# Patient Record
Sex: Male | Born: 1965 | Race: White | Hispanic: No | Marital: Single | Smoking: Former smoker
Health system: Southern US, Community
[De-identification: ages and names within clinical notes are randomized; demographics above are authoritative.]

## PROBLEM LIST (undated history)

## (undated) DIAGNOSIS — I2510 Atherosclerotic heart disease of native coronary artery without angina pectoris: Secondary | ICD-10-CM

## (undated) DIAGNOSIS — I219 Acute myocardial infarction, unspecified: Secondary | ICD-10-CM

## (undated) DIAGNOSIS — E785 Hyperlipidemia, unspecified: Secondary | ICD-10-CM

## (undated) DIAGNOSIS — G43909 Migraine, unspecified, not intractable, without status migrainosus: Secondary | ICD-10-CM

## (undated) DIAGNOSIS — I10 Essential (primary) hypertension: Secondary | ICD-10-CM

## (undated) DIAGNOSIS — I5020 Unspecified systolic (congestive) heart failure: Secondary | ICD-10-CM

## (undated) DIAGNOSIS — I5022 Chronic systolic (congestive) heart failure: Secondary | ICD-10-CM

## (undated) DIAGNOSIS — Q211 Atrial septal defect, unspecified: Secondary | ICD-10-CM

## (undated) DIAGNOSIS — I255 Ischemic cardiomyopathy: Secondary | ICD-10-CM

## (undated) DIAGNOSIS — K219 Gastro-esophageal reflux disease without esophagitis: Secondary | ICD-10-CM

## (undated) DIAGNOSIS — I447 Left bundle-branch block, unspecified: Secondary | ICD-10-CM

## (undated) HISTORY — DX: Atrial septal defect: Q21.1

## (undated) HISTORY — DX: Atrial septal defect, unspecified: Q21.10

## (undated) HISTORY — DX: Hyperlipidemia, unspecified: E78.5

## (undated) HISTORY — DX: Left bundle-branch block, unspecified: I44.7

## (undated) HISTORY — DX: Unspecified systolic (congestive) heart failure: I50.20

## (undated) HISTORY — PX: CORONARY ANGIOPLASTY: SHX604

## (undated) HISTORY — DX: Gastro-esophageal reflux disease without esophagitis: K21.9

---

## 2005-08-23 ENCOUNTER — Other Ambulatory Visit: Payer: Self-pay

## 2005-08-23 ENCOUNTER — Emergency Department: Payer: Self-pay | Admitting: Emergency Medicine

## 2007-06-02 ENCOUNTER — Emergency Department: Payer: Self-pay | Admitting: Emergency Medicine

## 2007-07-06 ENCOUNTER — Emergency Department: Payer: Self-pay | Admitting: Emergency Medicine

## 2008-07-01 ENCOUNTER — Emergency Department: Payer: Self-pay | Admitting: Emergency Medicine

## 2008-11-03 ENCOUNTER — Inpatient Hospital Stay: Payer: Self-pay | Admitting: Internal Medicine

## 2010-01-25 ENCOUNTER — Inpatient Hospital Stay: Payer: Self-pay | Admitting: Specialist

## 2010-11-12 ENCOUNTER — Inpatient Hospital Stay: Payer: Self-pay | Admitting: Internal Medicine

## 2010-12-20 ENCOUNTER — Inpatient Hospital Stay: Payer: Self-pay | Admitting: Internal Medicine

## 2010-12-26 ENCOUNTER — Ambulatory Visit: Payer: Self-pay

## 2011-06-08 ENCOUNTER — Emergency Department: Payer: Self-pay | Admitting: Emergency Medicine

## 2011-06-08 LAB — CBC
HCT: 41.4 % (ref 40.0–52.0)
HGB: 14.1 g/dL (ref 13.0–18.0)
MCH: 32.5 pg (ref 26.0–34.0)
MCHC: 34 g/dL (ref 32.0–36.0)
MCV: 96 fL (ref 80–100)
RBC: 4.33 10*6/uL — ABNORMAL LOW (ref 4.40–5.90)
RDW: 14.2 % (ref 11.5–14.5)
WBC: 6.3 10*3/uL (ref 3.8–10.6)

## 2011-06-08 LAB — BASIC METABOLIC PANEL
Anion Gap: 8 (ref 7–16)
BUN: 9 mg/dL (ref 7–18)
Chloride: 104 mmol/L (ref 98–107)
Creatinine: 1.02 mg/dL (ref 0.60–1.30)
EGFR (African American): >60
EGFR (Non-African Amer.): >60
Glucose: 110 mg/dL — ABNORMAL HIGH (ref 65–99)
Osmolality: 279 (ref 275–301)
Sodium: 140 mmol/L (ref 136–145)

## 2011-06-08 LAB — TROPONIN I: Troponin-I: <0.02 ng/mL

## 2012-08-26 ENCOUNTER — Emergency Department: Payer: Self-pay | Admitting: Emergency Medicine

## 2012-12-16 ENCOUNTER — Emergency Department: Payer: Self-pay | Admitting: Emergency Medicine

## 2013-02-23 ENCOUNTER — Emergency Department: Payer: Self-pay | Admitting: Emergency Medicine

## 2013-11-12 ENCOUNTER — Emergency Department: Payer: Self-pay | Admitting: Emergency Medicine

## 2013-11-12 LAB — COMPREHENSIVE METABOLIC PANEL
ALBUMIN: 4.7 g/dL (ref 3.4–5.0)
ALT: 36 U/L (ref 12–78)
Alkaline Phosphatase: 82 U/L
Anion Gap: 7 (ref 7–16)
BUN: 21 mg/dL — ABNORMAL HIGH (ref 7–18)
Bilirubin,Total: 1.1 mg/dL — ABNORMAL HIGH (ref 0.2–1.0)
CALCIUM: 9.6 mg/dL (ref 8.5–10.1)
CHLORIDE: 104 mmol/L (ref 98–107)
CREATININE: 1.3 mg/dL (ref 0.60–1.30)
Co2: 24 mmol/L (ref 21–32)
EGFR (African American): >60
Glucose: 101 mg/dL — ABNORMAL HIGH (ref 65–99)
OSMOLALITY: 273 (ref 275–301)
POTASSIUM: 3.6 mmol/L (ref 3.5–5.1)
SGOT(AST): 35 U/L (ref 15–37)
SODIUM: 135 mmol/L — AB (ref 136–145)
TOTAL PROTEIN: 8.8 g/dL — AB (ref 6.4–8.2)

## 2013-11-12 LAB — CBC
HCT: 48.5 % (ref 40.0–52.0)
HGB: 16.6 g/dL (ref 13.0–18.0)
MCH: 32.4 pg (ref 26.0–34.0)
MCHC: 34.3 g/dL (ref 32.0–36.0)
MCV: 94 fL (ref 80–100)
Platelet: 227 10*3/uL (ref 150–440)
RBC: 5.14 10*6/uL (ref 4.40–5.90)
RDW: 14.3 % (ref 11.5–14.5)
WBC: 6.5 10*3/uL (ref 3.8–10.6)

## 2013-11-12 LAB — TROPONIN I: Troponin-I: <0.02 ng/mL

## 2013-11-12 LAB — CK: CK, TOTAL: 401 U/L — AB

## 2013-11-18 ENCOUNTER — Emergency Department: Payer: Self-pay

## 2013-11-18 LAB — URINALYSIS, COMPLETE
Bilirubin,UR: NEGATIVE
Glucose,UR: NEGATIVE mg/dL (ref 0–75)
Hyaline Cast: 89
KETONE: NEGATIVE
Leukocyte Esterase: NEGATIVE
Nitrite: NEGATIVE
Ph: 5 (ref 4.5–8.0)
RBC,UR: 20 /HPF (ref 0–5)
SQUAMOUS EPITHELIAL: NONE SEEN
Specific Gravity: 1.028 (ref 1.003–1.030)
WBC UR: 4 /HPF (ref 0–5)

## 2013-11-18 LAB — CBC
HCT: 50.9 % (ref 40.0–52.0)
HGB: 17.3 g/dL (ref 13.0–18.0)
MCH: 31.9 pg (ref 26.0–34.0)
MCHC: 34 g/dL (ref 32.0–36.0)
MCV: 94 fL (ref 80–100)
Platelet: 269 10*3/uL (ref 150–440)
RBC: 5.42 10*6/uL (ref 4.40–5.90)
RDW: 13.8 % (ref 11.5–14.5)
WBC: 9.8 10*3/uL (ref 3.8–10.6)

## 2013-11-18 LAB — CK-MB: CK-MB: 1.7 ng/mL (ref 0.5–3.6)

## 2013-11-18 LAB — BASIC METABOLIC PANEL
Anion Gap: 10 (ref 7–16)
BUN: 22 mg/dL — AB (ref 7–18)
CHLORIDE: 103 mmol/L (ref 98–107)
Calcium, Total: 10.5 mg/dL — ABNORMAL HIGH (ref 8.5–10.1)
Co2: 24 mmol/L (ref 21–32)
Creatinine: 2.13 mg/dL — ABNORMAL HIGH (ref 0.60–1.30)
EGFR (Non-African Amer.): 35 — ABNORMAL LOW
GFR CALC AF AMER: 41 — AB
Glucose: 85 mg/dL (ref 65–99)
Osmolality: 276 (ref 275–301)
Potassium: 3.7 mmol/L (ref 3.5–5.1)
SODIUM: 137 mmol/L (ref 136–145)

## 2013-11-18 LAB — TROPONIN I
Troponin-I: <0.02 ng/mL
Troponin-I: <0.02 ng/mL

## 2013-11-18 LAB — CK: CK, Total: 221 U/L

## 2013-12-10 ENCOUNTER — Emergency Department: Payer: Self-pay | Admitting: Emergency Medicine

## 2013-12-10 LAB — URINALYSIS, COMPLETE
Bilirubin,UR: NEGATIVE
Blood: NEGATIVE
Glucose,UR: NEGATIVE mg/dL
Ketone: NEGATIVE
Leukocyte Esterase: NEGATIVE
Nitrite: NEGATIVE
Ph: 5
Protein: 30
RBC,UR: 6 /HPF
Specific Gravity: 1.027
Squamous Epithelial: NONE SEEN
WBC UR: 5 /HPF

## 2013-12-10 LAB — CBC WITH DIFFERENTIAL/PLATELET
Basophil #: 0.1 10*3/uL (ref 0.0–0.1)
Basophil %: 0.6 %
EOS PCT: 0.7 %
Eosinophil #: 0.1 10*3/uL (ref 0.0–0.7)
HCT: 49.4 % (ref 40.0–52.0)
HGB: 16.3 g/dL (ref 13.0–18.0)
LYMPHS ABS: 1.9 10*3/uL (ref 1.0–3.6)
Lymphocyte %: 20.7 %
MCH: 31.6 pg (ref 26.0–34.0)
MCHC: 32.9 g/dL (ref 32.0–36.0)
MCV: 96 fL (ref 80–100)
MONO ABS: 0.8 x10 3/mm (ref 0.2–1.0)
Monocyte %: 8.9 %
NEUTROS ABS: 6.3 10*3/uL (ref 1.4–6.5)
Neutrophil %: 69.1 %
Platelet: 254 10*3/uL (ref 150–440)
RBC: 5.15 10*6/uL (ref 4.40–5.90)
RDW: 14.1 % (ref 11.5–14.5)
WBC: 9.1 10*3/uL (ref 3.8–10.6)

## 2013-12-10 LAB — COMPREHENSIVE METABOLIC PANEL WITH GFR
Albumin: 4.6 g/dL
Alkaline Phosphatase: 81 U/L
Anion Gap: 10
BUN: 22 mg/dL — ABNORMAL HIGH
Bilirubin,Total: 0.8 mg/dL
Calcium, Total: 10.8 mg/dL — ABNORMAL HIGH
Chloride: 104 mmol/L
Co2: 23 mmol/L
Creatinine: 1.72 mg/dL — ABNORMAL HIGH
EGFR (African American): 53 — ABNORMAL LOW
EGFR (Non-African Amer.): 46 — ABNORMAL LOW
Glucose: 118 mg/dL — ABNORMAL HIGH
Osmolality: 278
Potassium: 4 mmol/L
SGOT(AST): 27 U/L
SGPT (ALT): 37 U/L
Sodium: 137 mmol/L
Total Protein: 8.8 g/dL — ABNORMAL HIGH

## 2013-12-10 LAB — CK: CK, TOTAL: 179 U/L

## 2013-12-30 ENCOUNTER — Emergency Department: Payer: Self-pay

## 2013-12-30 LAB — MAGNESIUM: Magnesium: 2.3 mg/dL

## 2013-12-30 LAB — BASIC METABOLIC PANEL
Anion Gap: 9 (ref 7–16)
BUN: 22 mg/dL — ABNORMAL HIGH (ref 7–18)
CHLORIDE: 105 mmol/L (ref 98–107)
CREATININE: 2.18 mg/dL — AB (ref 0.60–1.30)
Calcium, Total: 10.2 mg/dL — ABNORMAL HIGH (ref 8.5–10.1)
Co2: 23 mmol/L (ref 21–32)
EGFR (African American): 40 — ABNORMAL LOW
EGFR (Non-African Amer.): 34 — ABNORMAL LOW
GLUCOSE: 73 mg/dL (ref 65–99)
Osmolality: 276 (ref 275–301)
POTASSIUM: 3.6 mmol/L (ref 3.5–5.1)
Sodium: 137 mmol/L (ref 136–145)

## 2013-12-30 LAB — TROPONIN I
Troponin-I: <0.02 ng/mL
Troponin-I: <0.02 ng/mL

## 2013-12-30 LAB — CBC
HCT: 47.6 % (ref 40.0–52.0)
HGB: 15.6 g/dL (ref 13.0–18.0)
MCH: 31.5 pg (ref 26.0–34.0)
MCHC: 32.7 g/dL (ref 32.0–36.0)
MCV: 96 fL (ref 80–100)
Platelet: 262 10*3/uL (ref 150–440)
RBC: 4.95 10*6/uL (ref 4.40–5.90)
RDW: 14.2 % (ref 11.5–14.5)
WBC: 12.7 10*3/uL — AB (ref 3.8–10.6)

## 2013-12-30 LAB — CK TOTAL AND CKMB (NOT AT ARMC)
CK, Total: 197 U/L
CK-MB: 1.5 ng/mL (ref 0.5–3.6)

## 2014-02-10 ENCOUNTER — Emergency Department: Payer: Self-pay | Admitting: Emergency Medicine

## 2014-05-01 ENCOUNTER — Emergency Department: Payer: Self-pay | Admitting: Internal Medicine

## 2014-06-19 ENCOUNTER — Emergency Department: Payer: Self-pay | Admitting: Emergency Medicine

## 2014-06-19 LAB — CBC WITH DIFFERENTIAL/PLATELET
BASOS ABS: 0 10*3/uL (ref 0.0–0.1)
BASOS PCT: 0.6 %
EOS ABS: 0.1 10*3/uL (ref 0.0–0.7)
Eosinophil %: 1 %
HCT: 46.1 % (ref 40.0–52.0)
HGB: 15.3 g/dL (ref 13.0–18.0)
LYMPHS ABS: 1.9 10*3/uL (ref 1.0–3.6)
Lymphocyte %: 30.5 %
MCH: 31.8 pg (ref 26.0–34.0)
MCHC: 33.2 g/dL (ref 32.0–36.0)
MCV: 96 fL (ref 80–100)
Monocyte #: 0.6 x10 3/mm (ref 0.2–1.0)
Monocyte %: 9.5 %
NEUTROS ABS: 3.6 10*3/uL (ref 1.4–6.5)
Neutrophil %: 58.4 %
Platelet: 207 10*3/uL (ref 150–440)
RBC: 4.8 10*6/uL (ref 4.40–5.90)
RDW: 13.4 % (ref 11.5–14.5)
WBC: 6.2 10*3/uL (ref 3.8–10.6)

## 2014-06-19 LAB — COMPREHENSIVE METABOLIC PANEL
ALK PHOS: 75 U/L
ALT: 33 U/L
Albumin: 4.1 g/dL (ref 3.4–5.0)
Anion Gap: 10 (ref 7–16)
BILIRUBIN TOTAL: 0.9 mg/dL (ref 0.2–1.0)
BUN: 14 mg/dL (ref 7–18)
CHLORIDE: 107 mmol/L (ref 98–107)
CREATININE: 1.1 mg/dL (ref 0.60–1.30)
Calcium, Total: 9 mg/dL (ref 8.5–10.1)
Co2: 23 mmol/L (ref 21–32)
Glucose: 90 mg/dL (ref 65–99)
Osmolality: 279 (ref 275–301)
Potassium: 3.7 mmol/L (ref 3.5–5.1)
SGOT(AST): 26 U/L (ref 15–37)
SODIUM: 140 mmol/L (ref 136–145)
TOTAL PROTEIN: 7.5 g/dL (ref 6.4–8.2)

## 2014-06-19 LAB — URINALYSIS, COMPLETE
Bacteria: NONE SEEN
Bilirubin,UR: NEGATIVE
Blood: NEGATIVE
Glucose,UR: NEGATIVE mg/dL (ref 0–75)
KETONE: NEGATIVE
Leukocyte Esterase: NEGATIVE
NITRITE: NEGATIVE
PH: 5 (ref 4.5–8.0)
PROTEIN: NEGATIVE
Specific Gravity: 1.024 (ref 1.003–1.030)
Squamous Epithelial: NONE SEEN
WBC UR: 5 /HPF (ref 0–5)

## 2014-06-19 LAB — LIPASE, BLOOD: Lipase: 175 U/L (ref 73–393)

## 2014-06-28 ENCOUNTER — Emergency Department: Payer: Self-pay | Admitting: Emergency Medicine

## 2014-06-28 LAB — CBC WITH DIFFERENTIAL/PLATELET
Basophil #: 0 10*3/uL (ref 0.0–0.1)
Basophil %: 0.7 %
EOS PCT: 1 %
Eosinophil #: 0 10*3/uL (ref 0.0–0.7)
HCT: 42.9 % (ref 40.0–52.0)
HGB: 14.2 g/dL (ref 13.0–18.0)
LYMPHS PCT: 27 %
Lymphocyte #: 1.3 10*3/uL (ref 1.0–3.6)
MCH: 31.4 pg (ref 26.0–34.0)
MCHC: 33.1 g/dL (ref 32.0–36.0)
MCV: 95 fL (ref 80–100)
Monocyte #: 0.4 x10 3/mm (ref 0.2–1.0)
Monocyte %: 8.8 %
Neutrophil #: 3 10*3/uL (ref 1.4–6.5)
Neutrophil %: 62.5 %
PLATELETS: 209 10*3/uL (ref 150–440)
RBC: 4.51 10*6/uL (ref 4.40–5.90)
RDW: 13.7 % (ref 11.5–14.5)
WBC: 4.8 10*3/uL (ref 3.8–10.6)

## 2014-06-28 LAB — BASIC METABOLIC PANEL
Anion Gap: 4 — ABNORMAL LOW (ref 7–16)
BUN: 11 mg/dL (ref 7–18)
CALCIUM: 8.8 mg/dL (ref 8.5–10.1)
CHLORIDE: 109 mmol/L — AB (ref 98–107)
Co2: 27 mmol/L (ref 21–32)
Creatinine: 0.9 mg/dL (ref 0.60–1.30)
EGFR (Non-African Amer.): >60
Glucose: 100 mg/dL — ABNORMAL HIGH (ref 65–99)
Osmolality: 279 (ref 275–301)
Potassium: 4.5 mmol/L (ref 3.5–5.1)
Sodium: 140 mmol/L (ref 136–145)

## 2014-06-28 LAB — URINALYSIS, COMPLETE
BLOOD: NEGATIVE
Bilirubin,UR: NEGATIVE
GLUCOSE, UR: NEGATIVE mg/dL (ref 0–75)
Hyaline Cast: 2
Ketone: NEGATIVE
LEUKOCYTE ESTERASE: NEGATIVE
NITRITE: NEGATIVE
PROTEIN: NEGATIVE
Ph: 8 (ref 4.5–8.0)
RBC,UR: NONE SEEN /HPF (ref 0–5)
SPECIFIC GRAVITY: 1.012 (ref 1.003–1.030)
Squamous Epithelial: NONE SEEN
WBC UR: NONE SEEN /HPF (ref 0–5)

## 2014-06-28 LAB — COMPREHENSIVE METABOLIC PANEL
ALK PHOS: 67 U/L (ref 46–116)
Albumin: 3.6 g/dL (ref 3.4–5.0)
Bilirubin,Total: 0.5 mg/dL (ref 0.2–1.0)
SGOT(AST): 29 U/L (ref 15–37)
SGPT (ALT): 34 U/L (ref 14–63)
Total Protein: 6.8 g/dL (ref 6.4–8.2)

## 2014-06-28 LAB — LIPASE, BLOOD: LIPASE: 132 U/L (ref 73–393)

## 2014-12-22 ENCOUNTER — Observation Stay
Admission: EM | Admit: 2014-12-22 | Discharge: 2014-12-23 | Disposition: A | Discharge status: Home / Self Care | Payer: Self-pay | Attending: Internal Medicine | Admitting: Internal Medicine

## 2014-12-22 ENCOUNTER — Emergency Department: Payer: Self-pay

## 2014-12-22 ENCOUNTER — Encounter: Payer: Self-pay | Admitting: Emergency Medicine

## 2014-12-22 ENCOUNTER — Observation Stay
Admit: 2014-12-22 | Discharge: 2014-12-22 | Disposition: A | Discharge status: Home / Self Care | Payer: Self-pay | Attending: Internal Medicine | Admitting: Internal Medicine

## 2014-12-22 DIAGNOSIS — Z7982 Long term (current) use of aspirin: Secondary | ICD-10-CM | POA: Insufficient documentation

## 2014-12-22 DIAGNOSIS — I2510 Atherosclerotic heart disease of native coronary artery without angina pectoris: Secondary | ICD-10-CM | POA: Insufficient documentation

## 2014-12-22 DIAGNOSIS — R079 Chest pain, unspecified: Secondary | ICD-10-CM | POA: Diagnosis present

## 2014-12-22 DIAGNOSIS — R911 Solitary pulmonary nodule: Secondary | ICD-10-CM | POA: Diagnosis present

## 2014-12-22 DIAGNOSIS — F17200 Nicotine dependence, unspecified, uncomplicated: Secondary | ICD-10-CM | POA: Insufficient documentation

## 2014-12-22 DIAGNOSIS — I10 Essential (primary) hypertension: Secondary | ICD-10-CM | POA: Diagnosis present

## 2014-12-22 DIAGNOSIS — Z8249 Family history of ischemic heart disease and other diseases of the circulatory system: Secondary | ICD-10-CM | POA: Insufficient documentation

## 2014-12-22 DIAGNOSIS — R0789 Other chest pain: Principal | ICD-10-CM | POA: Insufficient documentation

## 2014-12-22 DIAGNOSIS — I252 Old myocardial infarction: Secondary | ICD-10-CM | POA: Insufficient documentation

## 2014-12-22 DIAGNOSIS — K760 Fatty (change of) liver, not elsewhere classified: Secondary | ICD-10-CM | POA: Insufficient documentation

## 2014-12-22 DIAGNOSIS — R0602 Shortness of breath: Secondary | ICD-10-CM | POA: Insufficient documentation

## 2014-12-22 DIAGNOSIS — R55 Syncope and collapse: Secondary | ICD-10-CM

## 2014-12-22 DIAGNOSIS — Z955 Presence of coronary angioplasty implant and graft: Secondary | ICD-10-CM | POA: Insufficient documentation

## 2014-12-22 DIAGNOSIS — E785 Hyperlipidemia, unspecified: Secondary | ICD-10-CM | POA: Diagnosis present

## 2014-12-22 DIAGNOSIS — R918 Other nonspecific abnormal finding of lung field: Secondary | ICD-10-CM | POA: Insufficient documentation

## 2014-12-22 DIAGNOSIS — R05 Cough: Secondary | ICD-10-CM | POA: Insufficient documentation

## 2014-12-22 HISTORY — DX: Essential (primary) hypertension: I10

## 2014-12-22 HISTORY — DX: Atherosclerotic heart disease of native coronary artery without angina pectoris: I25.10

## 2014-12-22 HISTORY — DX: Acute myocardial infarction, unspecified: I21.9

## 2014-12-22 LAB — COMPREHENSIVE METABOLIC PANEL
ALBUMIN: 4.6 g/dL (ref 3.5–5.0)
ALT: 35 U/L (ref 17–63)
AST: 25 U/L (ref 15–41)
Alkaline Phosphatase: 75 U/L (ref 38–126)
Anion gap: 9 (ref 5–15)
BUN: 20 mg/dL (ref 6–20)
CALCIUM: 9.3 mg/dL (ref 8.9–10.3)
CO2: 25 mmol/L (ref 22–32)
CREATININE: 1.06 mg/dL (ref 0.61–1.24)
Chloride: 104 mmol/L (ref 101–111)
GFR calc Af Amer: >60 mL/min (ref >60)
GFR calc non Af Amer: >60 mL/min (ref >60)
Glucose, Bld: 117 mg/dL — ABNORMAL HIGH (ref 65–99)
Potassium: 3.7 mmol/L (ref 3.5–5.1)
SODIUM: 138 mmol/L (ref 135–145)
TOTAL PROTEIN: 8.3 g/dL — AB (ref 6.5–8.1)
Total Bilirubin: 0.8 mg/dL (ref 0.3–1.2)

## 2014-12-22 LAB — CBC WITH DIFFERENTIAL/PLATELET
Basophils Absolute: 0 10*3/uL (ref 0–0.1)
Basophils Relative: 0 %
Eosinophils Absolute: 0 10*3/uL (ref 0–0.7)
Eosinophils Relative: 1 %
HCT: 45.1 % (ref 40.0–52.0)
Hemoglobin: 15.3 g/dL (ref 13.0–18.0)
LYMPHS PCT: 40 %
Lymphs Abs: 2.3 10*3/uL (ref 1.0–3.6)
MCH: 31.6 pg (ref 26.0–34.0)
MCHC: 33.9 g/dL (ref 32.0–36.0)
MCV: 93.3 fL (ref 80.0–100.0)
MONO ABS: 0.7 10*3/uL (ref 0.2–1.0)
MONOS PCT: 12 %
Neutro Abs: 2.7 10*3/uL (ref 1.4–6.5)
Neutrophils Relative %: 47 %
Platelets: 207 10*3/uL (ref 150–440)
RBC: 4.83 MIL/uL (ref 4.40–5.90)
RDW: 14.1 % (ref 11.5–14.5)
WBC: 5.8 10*3/uL (ref 3.8–10.6)

## 2014-12-22 LAB — CK: CK TOTAL: 251 U/L (ref 49–397)

## 2014-12-22 LAB — TROPONIN I
Troponin I: <0.03 ng/mL (ref <0.031)
Troponin I: <0.03 ng/mL (ref <0.031)
Troponin I: <0.03 ng/mL (ref <0.031)
Troponin I: <0.03 ng/mL (ref <0.031)

## 2014-12-22 LAB — TSH: TSH: 1.624 u[IU]/mL (ref 0.350–4.500)

## 2014-12-22 LAB — HEMOGLOBIN A1C: Hgb A1c MFr Bld: 5.6 % (ref 4.0–6.0)

## 2014-12-22 MED ORDER — IOHEXOL 350 MG/ML SOLN
100.0000 mL | Freq: Once | INTRAVENOUS | Status: AC | PRN
  Administered 2014-12-22: 100 mL via INTRAVENOUS

## 2014-12-22 MED ORDER — ACETAMINOPHEN 325 MG PO TABS
650.0000 mg | ORAL_TABLET | ORAL | Status: DC | PRN
  Administered 2014-12-23: 650 mg via ORAL
  Filled 2014-12-22: qty 2

## 2014-12-22 MED ORDER — ATORVASTATIN CALCIUM 20 MG PO TABS
20.0000 mg | ORAL_TABLET | Freq: Every day | ORAL | Status: DC
  Administered 2014-12-22: 20 mg via ORAL
  Filled 2014-12-22: qty 1

## 2014-12-22 MED ORDER — NITROGLYCERIN 0.4 MG SL SUBL
0.4000 mg | SUBLINGUAL_TABLET | SUBLINGUAL | Status: DC | PRN
Start: 2014-12-22 — End: 2014-12-23

## 2014-12-22 MED ORDER — ASPIRIN 81 MG PO CHEW
324.0000 mg | CHEWABLE_TABLET | Freq: Once | ORAL | Status: AC
  Administered 2014-12-22: 324 mg via ORAL
  Filled 2014-12-22: qty 4

## 2014-12-22 MED ORDER — ONDANSETRON HCL 4 MG/2ML IJ SOLN: 4.0000 mg | Freq: Four times a day (QID) | INTRAMUSCULAR | Status: DC | PRN

## 2014-12-22 MED ORDER — SODIUM CHLORIDE 0.9 % IV SOLN
INTRAVENOUS | Status: AC
  Administered 2014-12-22: 14:00:00 via INTRAVENOUS

## 2014-12-22 MED ORDER — METOPROLOL TARTRATE 25 MG PO TABS
12.5000 mg | ORAL_TABLET | Freq: Two times a day (BID) | ORAL | Status: DC
  Administered 2014-12-22 – 2014-12-23 (×2): 12.5 mg via ORAL
  Filled 2014-12-22 (×2): qty 1

## 2014-12-22 MED ORDER — HEPARIN SODIUM (PORCINE) 5000 UNIT/ML IJ SOLN
5000.0000 [IU] | Freq: Three times a day (TID) | INTRAMUSCULAR | Status: DC
  Administered 2014-12-22 – 2014-12-23 (×3): 5000 [IU] via SUBCUTANEOUS
  Filled 2014-12-22 (×3): qty 1

## 2014-12-22 MED ORDER — ASPIRIN EC 81 MG PO TBEC: 81.0000 mg | DELAYED_RELEASE_TABLET | Freq: Every day | ORAL | Status: DC

## 2014-12-22 NOTE — Consult Note (Signed)
[facility] Cardiology  CARDIOLOGY CONSULT NOTE  Patient ID: Bryce [name] MRN: [id number] DOB/AGE: November 19, 1965 49 y.o.  Admit date: 12/22/2014 Referring Physician Elby Showers MD Primary Physician [facility] Primary Cardiologist  Reason for Consultation chest pain  HPI: The patient is a 49 year old gentleman with known coronary artery disease, status post coronary stent LAD. Patient reports [place] week, he experienced cramping in his chest wall, working as  Scientist, water quality in the hot weather. Early this morning, the patient awoke with chest pain, with a cramping sensation. He presented to [facility] emergency room where EKG revealed T wave inversions and  laterally. The patient currently denies chest pain. He is ruling out for myocardial infarction by CPK isoenzymes and troponin. The patient reports his chest discomfort is unlike what he experienced prior to PCI. At that time he experienced substernal chest discomfort with radiation to his jaw and neck.  Review of systems complete and found to be negative unless listed above     Past Medical History  Diagnosis Date  . MI (myocardial infarction)   . Hypertension   . Coronary artery disease     Past Surgical History  Procedure Laterality Date  . Coronary angioplasty      Non-ST elevation MI status post stenting within bare-metal stent of the 75% lesion of the LAD, 11/13/2010.     Prescriptions prior to admission  Medication Sig Dispense Refill Last Dose  . aspirin EC 81 MG tablet Take 81 mg by mouth daily.   12/22/2014 at am   History   Social History  . Marital Status: Single    Spouse Name: N/A  . Number of Children: N/A  . Years of Education: N/A   Occupational History  . Not on file.   Social History Main Topics  . Smoking status: Current Some Day Smoker -- 0.25 packs/day  . Smokeless tobacco: Not on file  . Alcohol Use: No  . Drug Use: No  . Sexual Activity: Not on file   Other Topics Concern  . Not on file   Social History  Narrative  . No narrative on file    Family History  Problem Relation Age of Onset  . CAD Father       Review of systems complete and found to be negative unless listed above      PHYSICAL EXAM  General: Well developed, well nourished, in no acute distress HEENT:  Normocephalic and atramatic Neck:  No JVD.  Lungs: Clear bilaterally to auscultation and percussion. Heart: HRRR . Normal S1 and S2 without gallops or murmurs.  Abdomen: Bowel sounds are positive, abdomen soft and non-tender  Msk:  Back normal, normal gait. Normal strength and tone for age. Extremities: No clubbing, cyanosis or edema.   Neuro: Alert and oriented X 3. Psych:  Good affect, responds appropriately  Labs:   Lab Results  Component Value Date   WBC 5.8 12/22/2014   HGB 15.3 12/22/2014   HCT 45.1 12/22/2014   MCV 93.3 12/22/2014   PLT 207 12/22/2014    Recent Labs Lab 12/22/14 0551  NA 138  K 3.7  CL 104  CO2 25  BUN 20  CREATININE 1.06  CALCIUM 9.3  PROT 8.3*  BILITOT 0.8  ALKPHOS 75  ALT 35  AST 25  GLUCOSE 117*   Lab Results  Component Value Date   CKTOTAL 251 12/22/2014   TROPONINI <0.03 12/22/2014   No results found for: CHOL No results found for: HDL No results found for: [facility]  No results found for: TRIG No results found for: CHOLHDL No results found for: LDLDIRECT    Radiology: Dg Chest 2 View  12/22/2014   CLINICAL DATA:  Acute onset of generalized chest pain. Shortness of breath, cough and wheezing. Initial encounter.  EXAM: CHEST  2 VIEW  COMPARISON:  Chest radiograph performed 12/30/2013  FINDINGS: The lungs are well-aerated. There is question of peripheral right midlung opacity, which could reflect acute pneumonia. There is no evidence of pleural effusion or pneumothorax.  The heart is normal in size; the mediastinal contour is within normal limits. No acute osseous abnormalities are seen.  IMPRESSION: Question of peripheral right midlung opacity, which could reflect  acute pneumonia. Followup PA and lateral chest X-ray is recommended in 3-4 weeks following trial of antibiotic therapy to ensure resolution and exclude underlying malignancy.   Electronically Signed   By: Roanna Raider M.D.   On: 12/22/2014 06:44   Ct Angio Chest Aorta W/cm &/or Wo/cm  12/22/2014   CLINICAL DATA:  Shortness of breath and chest pain  EXAM: CT ANGIOGRAPHY CHEST WITH CONTRAST  TECHNIQUE: Initially, axial CT images were obtained through the chest without intravenous contrast material administration. Multidetector CT imaging of the chest was performed using the standard protocol during bolus administration of intravenous contrast. Multiplanar CT image reconstructions and MIPs were obtained to evaluate the vascular anatomy.  CONTRAST:  OMNIPAQUE IOHEXOL 350 MG/ML SOLN  COMPARISON:  Chest radiograph December 22, 2014  FINDINGS: There is no demonstrable pulmonary embolus. There is no thoracic aortic aneurysm or dissection. The visualize great vessels are normal.  There is extensive coronary artery calcification. Pericardium is not thickened.  There is no lung edema or consolidation. On axial slice 39 series 5, there is a 6 mm nodular opacity in the medial segment right middle lobe. There is slight scarring in the inferior aspect of the medial segment of the right middle lobe. Elsewhere lungs are clear.  Visualized thyroid appears normal. There is no appreciable thoracic adenopathy.  In the visualized upper abdomen, there is a degree of hepatic steatosis.  There is mild degenerative change in the thoracic spine. There are no blastic or lytic bone lesions.  Review of the MIP images confirms the above findings.  IMPRESSION: No demonstrable pulmonary embolus.  No edema or consolidation.  6 mm nodular opacity right middle lobe medial segment. Followup of this nodular opacity should be based on Fleischner Society guidelines. If the patient is at high risk for bronchogenic carcinoma, follow-up chest CT at  6-12 months is recommended. If the patient is at low risk for bronchogenic carcinoma, follow-up chest CT at 12 months is recommended. This recommendation follows the consensus statement: Guidelines for Management of Small Pulmonary Nodules Detected on CT Scans: A Statement from the Fleischner Society as published in Radiology 2005;237:395-400.  No adenopathy.  There is hepatic steatosis.   Electronically Signed   By: Bretta Bang III M.D.   On: 12/22/2014 08:08    EKG: Sinus rhythm with T-wave inversions laterally.  ASESSMENT AND PLAN:   49 year old gentleman with known coronary artery disease, status post prior coronary stent, who presents with chest pain with atypical features, has ruled out for myocardial infarction by CPK isoenzymes and troponin.  Recommendations  1. Continue current medications 2. Defer full dose anticoagulation 3. Lexiscan sestamibi study in the a.m.  SignedMarcina Millard MD,PhD, [facility] 12/22/2014, 1:28 PM

## 2014-12-22 NOTE — Plan of Care (Signed)
Problem: Phase III - Recovery through Discharge Goal: Discharge plan remains appropriate-arrangements made Outcome: Progressing Pt alert and from home, became SOB with some chest tightness this AM. Pt does have a cardiac history with 2 stents placed. Admitted with concern of ACS, first troponin was negative   Goals: lessen pain and assess pain frequently             Possible stent placement, cardiology to consult with pt             Encourage adequate rest             Monitor I and 0's.             Monitor for cardiac dysrhythmias, including disturbances of both rhythm and conduction,to identify and treat signifi cant dysrhythmia             Monitor respiratory status for signs of heart failure.

## 2014-12-22 NOTE — ED Notes (Signed)
Transported to CT 

## 2014-12-22 NOTE — Progress Notes (Signed)
*  PRELIMINARY RESULTS* Echocardiogram 2D Echocardiogram has been performed.  Bryce [name] 12/22/2014, 2:44 PM

## 2014-12-22 NOTE — ED Provider Notes (Signed)
[facility] Emergency Department Provider Note  ____________________________________________  Time seen: Approximately 7:02 AM  I have reviewed the triage vital signs and the nursing notes.   HISTORY  Chief Complaint Chest Pain    HPI Bryce [name] is a 49 y.o. male with history of coronary artery disease status post stents, hypertension presents for evaluation of chest pain and shortness of breath, sudden onset this morning, intermittent. Patient reports that he awoke from sleep with severe pain in his chest radiating to his back. This is associated with shortness of breath as well as diaphoresis. Currently his chest pain is resolved but he continues to feel mildly short of breath. He reports he thinks he may be dehydrated because he was also having cramping in his arms and legs yesterday and he works outside a lot. He reports his symptoms today were somewhat similar to his prior myocardial infarction. No modifying factors. No lightheadedness, dizziness, nausea or vomiting. He has had mild nonproductive cough today.   Past Medical History  Diagnosis Date  . MI (myocardial infarction)   . Hypertension     There are no active problems to display for this patient.   History reviewed. No pertinent past surgical history.  Current Outpatient Rx  Name  Route  Sig  Dispense  Refill  . aspirin EC 81 MG tablet   Oral   Take 81 mg by mouth daily.           Allergies Review of patient's allergies indicates no known allergies.  No family history on file.  Social History History  Substance Use Topics  . Smoking status: Current Some Day Smoker  . Smokeless tobacco: Not on file  . Alcohol Use: No    Review of Systems Constitutional: No fever/chills Eyes: No visual changes. ENT: No sore throat. Cardiovascular: + chest pain. Respiratory: +shortness of breath. Gastrointestinal: No abdominal pain.  No nausea, no vomiting.  No diarrhea.  No  constipation. Genitourinary: Negative for dysuria. Musculoskeletal: Negative for back pain. Skin: Negative for rash. Neurological: Negative for headaches, focal weakness or numbness.  10-point ROS otherwise negative.  ____________________________________________   PHYSICAL EXAM:  Filed Vitals:   12/22/14 0822  BP: 125/78  Pulse: 70  Temp: 98.3 F (36.8 C)  TempSrc: Oral  Resp: 20  SpO2: 98%     Constitutional: Alert and oriented. Well appearing and in no acute distress. Eyes: Conjunctivae are normal. PERRL. EOMI. Head: Atraumatic. Nose: No congestion/rhinnorhea. Mouth/Throat: Mucous membranes are moist.  Oropharynx non-erythematous. Neck: No stridor.   Cardiovascular: Normal rate, regular rhythm. Grossly normal heart sounds.  Good peripheral circulation. Respiratory: Normal respiratory effort.  No retractions. Lungs CTAB. Gastrointestinal: Soft and nontender. No distention. No abdominal bruits. No CVA tenderness. Genitourinary: deferred Musculoskeletal: No lower extremity tenderness nor edema.  No joint effusions. Neurologic:  Normal speech and language. No gross focal neurologic deficits are appreciated. No gait instability. Skin:  Skin is warm, dry and intact. No rash noted. Psychiatric: Mood and affect are normal. Speech and behavior are normal.  ____________________________________________   LABS (all labs ordered are listed, but only abnormal results are displayed)  Labs Reviewed  COMPREHENSIVE METABOLIC PANEL - Abnormal; Notable for the following:    Glucose, Bld 117 (*)    Total Protein 8.3 (*)    All other components within normal limits  CBC WITH DIFFERENTIAL/PLATELET  TROPONIN I  CK   ____________________________________________  EKG  ED ECG REPORT I, Gayla Doss, the attending physician, personally viewed  and interpreted this ECG.   Date: 12/22/2014  EKG Time: 05:41  Rate: 87  Rhythm:  sinus rhythm with short PR interval  Axis: normal   Intervals:none  ST&T Change: No acute ST segment elevation, new T-wave inversions in lead 2, 3, aVF, V5 and V6 when compared to EKG on 12/30/2013.  ____________________________________________  RADIOLOGY  CXR IMPRESSION: Question of peripheral right midlung opacity, which could reflect acute pneumonia. Followup PA and lateral chest X-ray is recommended in 3-4 weeks following trial of antibiotic therapy to ensure resolution and exclude underlying malignancy.   CT angio chest IMPRESSION: No demonstrable pulmonary embolus. No edema or consolidation.  6 mm nodular opacity right middle lobe medial segment. Followup of this nodular opacity should be based on Fleischner Society guidelines. If the patient is at high risk for bronchogenic carcinoma, follow-up chest CT at 6-12 months is recommended. If the patient is at low risk for bronchogenic carcinoma, follow-up chest CT at 12 months is recommended. This recommendation follows the consensus statement: Guidelines for Management of Small Pulmonary Nodules Detected on CT Scans: A Statement from the Fleischner Society as published in Radiology 2005;237:395-400.  No adenopathy. There is hepatic steatosis  ____________________________________________   PROCEDURES  Procedure(s) performed: None  Critical Care performed: No  ____________________________________________   INITIAL IMPRESSION / ASSESSMENT AND PLAN / ED COURSE  Pertinent labs & imaging results that were available during my care of the patient were reviewed by me and considered in my medical decision making (see chart for details).  Bryce [name] is a 49 y.o. male with history of coronary artery disease status post stents, hypertension presents for evaluation of chest pain and shortness of breath. At this time his chest pain has resolved however he remains slightly short of breath. Vital signs stable, he is afebrile. He has a benign examination. Chest x-ray was  initially concerning for possible right-sided pneumonia however CTA chest, which is negative for dissection, shows that that is representative of a 6 mm nodule, not pneumonia. Additionally, the patient has T-wave inversions in the inferior and lateral leads which are new when compared to EKG 1 year ago. My concern is for ACS. First troponin negative. Aspirin given. Case discussed with hospitalist for admission at 8:50 AM. ____________________________________________   FINAL CLINICAL IMPRESSION(S) / ED DIAGNOSES  Final diagnoses:  Chest pain, unspecified chest pain type  SOB (shortness of breath)      Gayla Doss, MD 12/22/14 [phone]

## 2014-12-22 NOTE — ED Notes (Signed)
Reports given to [facility] for pt to go to room 259

## 2014-12-22 NOTE — H&P (Signed)
[facility] [place] [facility]   PATIENT NAME: Bryce [name]    MR#:  [id number]  DATE OF BIRTH:  [birth year]  DATE OF ADMISSION:  12/22/2014  PRIMARY CARE PHYSICIAN: No PCP Per Patient   REQUESTING/REFERRING PHYSICIAN: Dr Inocencio Homes   CHIEF COMPLAINT:   Chief Complaint  Patient presents with  . Chest Pain    HISTORY OF PRESENT ILLNESS:  Bryce [name]  is a 49 y.o. male with a known history of coronary artery disease status post PCI and stenting, hypertension and hyperlipidemia who presents today with chest pain. 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 Last night he went to bed without eating. At about 4 in the morning he woke up with a heavy crampy feeling in his central and left chest. The pain was associated with diaphoresis, shortness of breath and mild nausea. Pain radiated to his back. He continues to have muscle cramping in his arms and fingers. On presentation to the emergency room his initial troponin is negative, his EKG shows possible new T-wave inversions. He is being admitted for ACS rule out.  PAST MEDICAL HISTORY:   Past Medical History  Diagnosis Date  . MI (myocardial infarction)   . Hypertension   . Coronary artery disease     PAST SURGICAL HISTORY:   Past Surgical History  Procedure Laterality Date  . Coronary angioplasty      Non-ST elevation MI status post stenting within bare-metal stent of the 75% lesion of the LAD, 11/13/2010.     SOCIAL HISTORY:   History  Substance Use Topics  . Smoking status: Current Some Day Smoker -- 0.25 packs/day  . Smokeless tobacco: Not on file  . Alcohol Use: No    FAMILY HISTORY:   Family History  Problem Relation Age of Onset  . CAD Father    States that his father has 69 coronary stents and has had 7 open cardiac procedures.  DRUG ALLERGIES:  No Known Allergies  REVIEW OF SYSTEMS:   Review of Systems   Constitutional: Positive for diaphoresis. Negative for fever, chills, weight loss and malaise/fatigue.  HENT: Negative for congestion and hearing loss.   Eyes: Negative for blurred vision and pain.  Respiratory: Positive for shortness of breath. Negative for cough, hemoptysis, sputum production and stridor.   Cardiovascular: Positive for chest pain. Negative for palpitations, orthopnea and leg swelling.  Gastrointestinal: Positive for nausea. Negative for vomiting, abdominal pain, diarrhea, constipation and blood in stool.  Genitourinary: Negative for dysuria and frequency.  Musculoskeletal: Negative for myalgias, back pain, joint pain and neck pain.  Skin: Negative for rash.  Neurological: Negative for focal weakness, loss of consciousness and headaches.  Endo/Heme/Allergies: Does not bruise/bleed easily.  Psychiatric/Behavioral: Negative for depression and hallucinations. The patient is not nervous/anxious.     MEDICATIONS AT HOME:   Prior to Admission medications   Medication Sig Start Date End Date Taking? Authorizing Provider  aspirin EC 81 MG tablet Take 81 mg by mouth daily.   Yes Historical Provider, MD      VITAL SIGNS:  Blood pressure 145/88, pulse 72, temperature 99 F (37.2 C), temperature source Oral, resp. rate 17, height  (1.778 m), weight 102.967 kg (227 lb), SpO2 99 %.  PHYSICAL EXAMINATION:  GENERAL:  49 y.o.-year-old patient lying in the bed with no acute distress.  EYES: Pupils equal, round, reactive to light and accommodation. No scleral icterus. Extraocular muscles intact.  HEENT: Head atraumatic, normocephalic. Oropharynx and nasopharynx clear. Mucous membranes are moist NECK:  Supple, no jugular venous distention. No thyroid enlargement, no tenderness.  LUNGS: Normal breath sounds bilaterally, no wheezing, rales, rhonchi or crepitation. No use of accessory muscles of respiration.  CARDIOVASCULAR: S1, S2 normal. No murmurs, rubs, or gallops.  ABDOMEN:  Soft, nontender, nondistended. Bowel sounds present. No organomegaly or mass.  EXTREMITIES: No pedal edema, cyanosis, or clubbing.  NEUROLOGIC: Cranial nerves II through XII are intact. Muscle strength 5/5 in all extremities. Sensation intact. PSYCHIATRIC: The patient is alert and oriented x 3.  SKIN: No obvious rash, lesion, or ulcer.   LABORATORY PANEL:   CBC  Recent Labs Lab 12/22/14 0551  WBC 5.8  HGB 15.3  HCT 45.1  PLT 207   ------------------------------------------------------------------------------------------------------------------  Chemistries   Recent Labs Lab 12/22/14 0551  NA 138  K 3.7  CL 104  CO2 25  GLUCOSE 117*  BUN 20  CREATININE 1.06  CALCIUM 9.3  AST 25  ALT 35  ALKPHOS 75  BILITOT 0.8   ------------------------------------------------------------------------------------------------------------------  Cardiac Enzymes  Recent Labs Lab 12/22/14 0551  TROPONINI <0.03   ------------------------------------------------------------------------------------------------------------------  RADIOLOGY:  Dg Chest 2 View  12/22/2014   CLINICAL DATA:  Acute onset of generalized chest pain. Shortness of breath, cough and wheezing. Initial encounter.  EXAM: CHEST  2 VIEW  COMPARISON:  Chest radiograph performed 12/30/2013  FINDINGS: The lungs are well-aerated. There is question of peripheral right midlung opacity, which could reflect acute pneumonia. There is no evidence of pleural effusion or pneumothorax.  The heart is normal in size; the mediastinal contour is within normal limits. No acute osseous abnormalities are seen.  IMPRESSION: Question of peripheral right midlung opacity, which could reflect acute pneumonia. Followup PA and lateral chest X-ray is recommended in 3-4 weeks following trial of antibiotic therapy to ensure resolution and exclude underlying malignancy.   Electronically Signed   By: Roanna Raider M.D.   On: 12/22/2014 06:44   Ct Angio  Chest Aorta W/cm &/or Wo/cm  12/22/2014   CLINICAL DATA:  Shortness of breath and chest pain  EXAM: CT ANGIOGRAPHY CHEST WITH CONTRAST  TECHNIQUE: Initially, axial CT images were obtained through the chest without intravenous contrast material administration. Multidetector CT imaging of the chest was performed using the standard protocol during bolus administration of intravenous contrast. Multiplanar CT image reconstructions and MIPs were obtained to evaluate the vascular anatomy.  CONTRAST:  OMNIPAQUE IOHEXOL 350 MG/ML SOLN  COMPARISON:  Chest radiograph December 22, 2014  FINDINGS: There is no demonstrable pulmonary embolus. There is no thoracic aortic aneurysm or dissection. The visualize great vessels are normal.  There is extensive coronary artery calcification. Pericardium is not thickened.  There is no lung edema or consolidation. On axial slice 39 series 5, there is a 6 mm nodular opacity in the medial segment right middle lobe. There is slight scarring in the inferior aspect of the medial segment of the right middle lobe. Elsewhere lungs are clear.  Visualized thyroid appears normal. There is no appreciable thoracic adenopathy.  In the visualized upper abdomen, there is a degree of hepatic steatosis.  There is mild degenerative change in the thoracic spine. There are no blastic or lytic bone lesions.  Review of the MIP images confirms the above findings.  IMPRESSION: No demonstrable pulmonary embolus.  No edema or consolidation.  6 mm nodular opacity right middle lobe medial segment. Followup of this nodular opacity should be based on Fleischner Society  guidelines. If the patient is at high risk for bronchogenic carcinoma, follow-up chest CT at 6-12 months is recommended. If the patient is at low risk for bronchogenic carcinoma, follow-up chest CT at 12 months is recommended. This recommendation follows the consensus statement: Guidelines for Management of Small Pulmonary Nodules Detected on CT Scans: A  Statement from the Fleischner Society as published in Radiology 2005;237:395-400.  No adenopathy.  There is hepatic steatosis.   Electronically Signed   By: Bretta Bang III M.D.   On: 12/22/2014 08:08    EKG:   Orders placed or performed during the hospital encounter of 12/22/14  . EKG 12-Lead  . EKG 12-Lead  . EKG 12-lead  . EKG 12-Lead    IMPRESSION AND PLAN:   Active Problems:   Chest pain   Essential hypertension   Hyperlipidemia  Problem #1 chest pain with EKG changes: Patient has been admitted to telemetry for ACS rule out. He has received aspirin 325 mg in the emergency room. He is being observed on telemetry, so far no arrhythmia. First troponin is negative we'll cycle cardiac enzymes. Cardiology has been consulted his primary cardiologist is Dr. Gwen Pounds. I have also started a beta blocker and statin. He will continue on aspirin 81 mg daily. We'll hold off on full anticoagulation unless his troponins escalate. Obtain 2-D echocardiogram. He is currently nothing by mouth in case of procedure this afternoon.  #2 hypertension: Blood pressure slightly elevated on presentation. I have started metoprolol 12.5 mg daily. It looks like he had been on carvedilol 80 mg daily in the past, but this is not on his current medication reconciliation  #3 hyperlipidemia: Start statin. Check fasting lipids in the morning  CODE STATUS: Full   TOTAL TIME TAKING CARE OF THIS PATIENT: 35 minutes.  Greater than 50% of time spent in coordination of care and counseling.All the records are reviewed and case discussed with ED provider.Management plans discussed with the patient, family and they are in agreement.   Elby Showers M.D on 12/22/2014 at 12:03 PM  Between 7am to 6pm - Pager - [phone]  After 6pm go to [email or web address] - password [email or web address]  [place] [facility]  Office  [phone]  CC: Primary care physician; No PCP Per Patient

## 2014-12-22 NOTE — ED Notes (Addendum)
Pt to triage via w/c, appears uncomfortable; st yesterday noted cramping in legs and hands (works outside as Teacher, English as a foreign language); took potassium supplement and drank OJ; awoke at 3am with pain between shoulder blades and mid CP accomp by SOB; st hx of MI 30yrs ago; took  ASA PTA; also reports had bloody stool PTA; denies hx of same

## 2014-12-23 ENCOUNTER — Encounter: Payer: Self-pay | Admitting: Radiology

## 2014-12-23 ENCOUNTER — Observation Stay: Payer: Self-pay

## 2014-12-23 DIAGNOSIS — R911 Solitary pulmonary nodule: Secondary | ICD-10-CM | POA: Diagnosis present

## 2014-12-23 LAB — LIPID PANEL
Cholesterol: 220 mg/dL — ABNORMAL HIGH (ref 0–200)
HDL: 34 mg/dL — ABNORMAL LOW (ref >40)
LDL Cholesterol: 147 mg/dL — ABNORMAL HIGH (ref 0–99)
TRIGLYCERIDES: 195 mg/dL — AB (ref <150)
Total CHOL/HDL Ratio: 6.5 RATIO
VLDL: 39 mg/dL (ref 0–40)

## 2014-12-23 LAB — NM MYOCAR MULTI W/SPECT W/WALL MOTION / EF
CHL CUP NUCLEAR SSS: 0
LV dias vol: 91 mL
LVSYSVOL: 39 mL
NUC STRESS TID: 0.97
SDS: 0
SRS: 0

## 2014-12-23 LAB — BASIC METABOLIC PANEL
ANION GAP: 5 (ref 5–15)
BUN: 17 mg/dL (ref 6–20)
CHLORIDE: 108 mmol/L (ref 101–111)
CO2: 26 mmol/L (ref 22–32)
CREATININE: 1 mg/dL (ref 0.61–1.24)
Calcium: 8.4 mg/dL — ABNORMAL LOW (ref 8.9–10.3)
GFR calc Af Amer: >60 mL/min (ref >60)
Glucose, Bld: 106 mg/dL — ABNORMAL HIGH (ref 65–99)
POTASSIUM: 4.4 mmol/L (ref 3.5–5.1)
Sodium: 139 mmol/L (ref 135–145)

## 2014-12-23 LAB — CBC
HCT: 39.3 % — ABNORMAL LOW (ref 40.0–52.0)
HEMOGLOBIN: 13.3 g/dL (ref 13.0–18.0)
MCH: 32.1 pg (ref 26.0–34.0)
MCHC: 33.8 g/dL (ref 32.0–36.0)
MCV: 95.2 fL (ref 80.0–100.0)
Platelets: 161 10*3/uL (ref 150–440)
RBC: 4.13 MIL/uL — AB (ref 4.40–5.90)
RDW: 14 % (ref 11.5–14.5)
WBC: 4.3 10*3/uL (ref 3.8–10.6)

## 2014-12-23 MED ORDER — LORAZEPAM 2 MG PO TABS
2.0000 mg | ORAL_TABLET | Freq: Once | ORAL | Status: AC
  Administered 2014-12-23: 2 mg via ORAL
  Filled 2014-12-23: qty 1

## 2014-12-23 MED ORDER — TECHNETIUM TC 99M SESTAMIBI GENERIC - CARDIOLITE
13.2500 | Freq: Once | INTRAVENOUS | Status: AC | PRN
  Administered 2014-12-23: 13.25 via INTRAVENOUS

## 2014-12-23 MED ORDER — METOPROLOL TARTRATE 25 MG PO TABS: 12.5000 mg | ORAL_TABLET | Freq: Two times a day (BID) | ORAL | Status: DC

## 2014-12-23 MED ORDER — ASPIRIN 81 MG PO CHEW
CHEWABLE_TABLET | ORAL | Status: AC
  Administered 2014-12-23: 81 mg
  Filled 2014-12-23: qty 1

## 2014-12-23 MED ORDER — REGADENOSON 0.4 MG/5ML IV SOLN
0.4000 mg | Freq: Once | INTRAVENOUS | Status: AC
  Administered 2014-12-23: 0.4 mg via INTRAVENOUS

## 2014-12-23 MED ORDER — KETOROLAC TROMETHAMINE 30 MG/ML IJ SOLN
30.0000 mg | Freq: Once | INTRAMUSCULAR | Status: AC
  Administered 2014-12-23: 30 mg via INTRAVENOUS
  Filled 2014-12-23: qty 1

## 2014-12-23 MED ORDER — PRAVASTATIN SODIUM 40 MG PO TABS
40.0000 mg | ORAL_TABLET | Freq: Every day | ORAL | Status: DC
Start: 2014-12-23 — End: 2017-02-04

## 2014-12-23 MED ORDER — TECHNETIUM TC 99M SESTAMIBI GENERIC - CARDIOLITE
30.0000 | Freq: Once | INTRAVENOUS | Status: AC | PRN
Start: 2014-12-23 — End: 2014-12-23
  Administered 2014-12-23: 30.82 via INTRAVENOUS

## 2014-12-23 MED ORDER — ATORVASTATIN CALCIUM 20 MG PO TABS
80.0000 mg | ORAL_TABLET | Freq: Every day | ORAL | Status: DC
Start: 2014-12-23 — End: 2014-12-23

## 2014-12-23 NOTE — Discharge Summary (Signed)
[facility] - [place] at [facility]  DISCHARGE SUMMARY   PATIENT NAME: Bryce [name]    MR#:  [id number]  DATE OF BIRTH:  [birth year]  DATE OF ADMISSION:  12/22/2014 ADMITTING PHYSICIAN: Gale Journey, MD  DATE OF DISCHARGE:  12/23/14  PRIMARY CARE PHYSICIAN: No PCP Per Patient    ADMISSION DIAGNOSIS:  SOB (shortness of breath) [R06.02] Chest pain, unspecified chest pain type [R07.9]  DISCHARGE DIAGNOSIS:  Principal Problem:   Chest pain Active Problems:   Essential hypertension   Hyperlipidemia   Solitary pulmonary nodule   SECONDARY DIAGNOSIS:   Past Medical History  Diagnosis Date  . MI (myocardial infarction)   . Hypertension   . Coronary artery disease     HOSPITAL COURSE:   Problem #1 chest pain inpatient with known coronary artery disease: Ruled out for acute coronary syndrome. He did have a stress test on the morning of discharge which showed no signs of ischemia. He will follow-up with Dr. Darrold Junker in one week. Note that on stress test it appears that his ejection fraction is decreased at 30-44%. This will need to be followed up with an echocardiogram in the outpatient setting to determine whether or not he truly has systolic heart failure.  #2 hypertension: Blood pressure slightly elevated on presentation. I have started metoprolol 12.5 mg daily. And due to potential heart failure on stress test will continue beta blocker therapy.  #3 hyperlipidemia: Elevated lipids with LDL of 147 in this patient with known coronary artery disease. Will start Pravachol at 40 mg daily.  #4 nodule seen on CT scan of the chest: Needs repeat study in 6-12 months. This will need to be followed by his primary care physician  DISCHARGE CONDITIONS:   Stable  CONSULTS OBTAINED:  Treatment Team:  Marcina Millard, MD  DRUG ALLERGIES:  No Known Allergies  DISCHARGE MEDICATIONS:   Current Discharge Medication List    START taking these  medications   Details  metoprolol tartrate (LOPRESSOR) 25 MG tablet Take 0.5 tablets (12.5 mg total) by mouth 2 (two) times daily. Qty: 60 tablet, Refills: 0    pravastatin (PRAVACHOL) 40 MG tablet Take 1 tablet (40 mg total) by mouth daily. Qty: 30 tablet, Refills: 0      CONTINUE these medications which have NOT CHANGED   Details  aspirin EC 81 MG tablet Take 81 mg by mouth daily.        DISCHARGE INSTRUCTIONS:    DIET:  Regular diet and Low fat, Low cholesterol diet  DISCHARGE CONDITION:  Fair  ACTIVITY:  Activity as tolerated  OXYGEN:  Home Oxygen: No.   Oxygen Delivery: room air  DISCHARGE LOCATION:  home   If you experience worsening of your admission symptoms, develop shortness of breath, life threatening emergency, suicidal or homicidal thoughts you must seek medical attention immediately by calling 911 or calling your MD immediately  if symptoms less severe.  You Must read complete instructions/literature along with all the possible adverse reactions/side effects for all the Medicines you take and that have been prescribed to you. Take any new Medicines after you have completely understood and accpet all the possible adverse reactions/side effects.   Please note  You were cared for by a hospitalist during your hospital stay. If you have any questions about your discharge medications or the care you received while you were in the hospital after you are discharged, you can call the unit and asked to speak with the hospitalist on call  if the hospitalist that took care of you is not available. Once you are discharged, your primary care physician will handle any further medical issues. Please note that NO REFILLS for any discharge medications will be authorized once you are discharged, as it is imperative that you return to your primary care physician (or establish a relationship with a primary care physician if you do not have one) for your aftercare needs so that they  can reassess your need for medications and monitor your lab values.  Today   CHIEF COMPLAINT:   Chief Complaint  Patient presents with  . Chest Pain    HISTORY OF PRESENT ILLNESS:  Bryce [name] is a 49 y.o. male with a known history of coronary artery disease status post PCI and stenting, hypertension and hyperlipidemia who presents today with chest pain. 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 Last night he went to bed without eating. At about 4 in the morning he woke up with a heavy crampy feeling in his central and left chest. The pain was associated with diaphoresis, shortness of breath and mild nausea. Pain radiated to his back. He continues to have muscle cramping in his arms and fingers. On presentation to the emergency room his initial troponin is negative, his EKG shows possible new T-wave inversions. He is being admitted for ACS rule out.  VITAL SIGNS:  Blood pressure 116/82, pulse 67, temperature 98 F (36.7 C), temperature source Oral, resp. rate 18, height  (1.778 m), weight 105.779 kg (233 lb 3.2 oz), SpO2 98 %.  I/O:    Intake/Output Summary (Last 24 hours) at 12/23/14 1510 Last data filed at 12/23/14 1509  Gross per 24 hour  Intake   2300 ml  Output    475 ml  Net   1825 ml    PHYSICAL EXAMINATION:  GENERAL: 49 y.o.-year-old patient lying in the bed with no acute distress. Obese LUNGS: Normal breath sounds bilaterally, no wheezing, rales, rhonchi or crepitation. No use of accessory muscles of respiration.  CARDIOVASCULAR: S1, S2 normal. No murmurs, rubs, or gallops.  ABDOMEN: Soft, nontender, nondistended. Bowel sounds present. No organomegaly or mass.  EXTREMITIES: No pedal edema, cyanosis, or clubbing.  NEUROLOGIC: Cranial nerves II through XII are intact. Muscle strength 5/5 in all extremities. Sensation intact. PSYCHIATRIC: The patient is alert and oriented x 3.  SKIN: No obvious  rash, lesion, or ulcer.   DATA REVIEW:   CBC  Recent Labs Lab 12/23/14 0559  WBC 4.3  HGB 13.3  HCT 39.3*  PLT 161    Chemistries   Recent Labs Lab 12/22/14 0551 12/23/14 0559  NA 138 139  K 3.7 4.4  CL 104 108  CO2 25 26  GLUCOSE 117* 106*  BUN 20 17  CREATININE 1.06 1.00  CALCIUM 9.3 8.4*  AST 25  --   ALT 35  --   ALKPHOS 75  --   BILITOT 0.8  --     Cardiac Enzymes  Recent Labs Lab 12/22/14 2241  TROPONINI <0.03    Microbiology Results  No results found for this or any previous visit.  RADIOLOGY:  Dg Chest 2 View  12/22/2014   CLINICAL DATA:  Acute onset of generalized chest pain. Shortness of breath, cough and wheezing. Initial encounter.  EXAM: CHEST  2 VIEW  COMPARISON:  Chest radiograph performed 12/30/2013  FINDINGS: The lungs are well-aerated. There is question of peripheral right midlung opacity,  which could reflect acute pneumonia. There is no evidence of pleural effusion or pneumothorax.  The heart is normal in size; the mediastinal contour is within normal limits. No acute osseous abnormalities are seen.  IMPRESSION: Question of peripheral right midlung opacity, which could reflect acute pneumonia. Followup PA and lateral chest X-ray is recommended in 3-4 weeks following trial of antibiotic therapy to ensure resolution and exclude underlying malignancy.   Electronically Signed   By: Roanna Raider M.D.   On: 12/22/2014 06:44   Nm Myocar Multi W/spect W/wall Motion / Ef  12/23/2014    The study is normal.  The left ventricular ejection fraction is moderately decreased (30-44%).    Ct Angio Chest Aorta W/cm &/or Wo/cm  12/22/2014   CLINICAL DATA:  Shortness of breath and chest pain  EXAM: CT ANGIOGRAPHY CHEST WITH CONTRAST  TECHNIQUE: Initially, axial CT images were obtained through the chest without intravenous contrast material administration. Multidetector CT imaging of the chest was performed using the standard protocol during bolus  administration of intravenous contrast. Multiplanar CT image reconstructions and MIPs were obtained to evaluate the vascular anatomy.  CONTRAST:  OMNIPAQUE IOHEXOL 350 MG/ML SOLN  COMPARISON:  Chest radiograph December 22, 2014  FINDINGS: There is no demonstrable pulmonary embolus. There is no thoracic aortic aneurysm or dissection. The visualize great vessels are normal.  There is extensive coronary artery calcification. Pericardium is not thickened.  There is no lung edema or consolidation. On axial slice 39 series 5, there is a 6 mm nodular opacity in the medial segment right middle lobe. There is slight scarring in the inferior aspect of the medial segment of the right middle lobe. Elsewhere lungs are clear.  Visualized thyroid appears normal. There is no appreciable thoracic adenopathy.  In the visualized upper abdomen, there is a degree of hepatic steatosis.  There is mild degenerative change in the thoracic spine. There are no blastic or lytic bone lesions.  Review of the MIP images confirms the above findings.  IMPRESSION: No demonstrable pulmonary embolus.  No edema or consolidation.  6 mm nodular opacity right middle lobe medial segment. Followup of this nodular opacity should be based on Fleischner Society guidelines. If the patient is at high risk for bronchogenic carcinoma, follow-up chest CT at 6-12 months is recommended. If the patient is at low risk for bronchogenic carcinoma, follow-up chest CT at 12 months is recommended. This recommendation follows the consensus statement: Guidelines for Management of Small Pulmonary Nodules Detected on CT Scans: A Statement from the Fleischner Society as published in Radiology 2005;237:395-400.  No adenopathy.  There is hepatic steatosis.   Electronically Signed   By: Bretta Bang III M.D.   On: 12/22/2014 08:08    EKG:   Orders placed or performed during the hospital encounter of 12/22/14  . EKG 12-Lead  . EKG 12-Lead  . EKG 12-lead  . EKG  12-Lead  . EKG 12-Lead   Management plans discussed with the patient, family and they are in agreement.  CODE STATUS:     Code Status Orders        Start     Ordered   12/22/14 1040  Full code   Continuous     12/22/14 1039      TOTAL TIME TAKING CARE OF THIS PATIENT: 35 minutes.  Greater than 50% of time spent in care coordination and counseling.  Elby Showers M.D on 12/23/2014 at 3:10 PM  Between 7am to 6pm - Pager - [phone]  After 6pm go to [email or web address] - password [email or web address] [facility]  [facility]  Office  [phone]  CC: Primary care physician; No PCP Per Patient

## 2014-12-23 NOTE — Discharge Instructions (Signed)
Follow-up with cardiology in 1 week. Follow-up with primary care within 2 weeks. There is an abnormal finding on CT scan of the chest which needs to be followed up in 6-12 months. Your primary care physician will need to order this study.   DIET:  Regular diet and Low fat, Low cholesterol diet  DISCHARGE CONDITION:  Fair  ACTIVITY:  Activity as tolerated  OXYGEN:  Home Oxygen: No.   Oxygen Delivery: room air  DISCHARGE LOCATION:  home   If you experience worsening of your admission symptoms, develop shortness of breath, life threatening emergency, suicidal or homicidal thoughts you must seek medical attention immediately by calling 911 or calling your MD immediately  if symptoms less severe.  You Must read complete instructions/literature along with all the possible adverse reactions/side effects for all the Medicines you take and that have been prescribed to you. Take any new Medicines after you have completely understood and accpet all the possible adverse reactions/side effects.   Please note  You were cared for by a hospitalist during your hospital stay. If you have any questions about your discharge medications or the care you received while you were in the hospital after you are discharged, you can call the unit and asked to speak with the hospitalist on call if the hospitalist that took care of you is not available. Once you are discharged, your primary care physician will handle any further medical issues. Please note that NO REFILLS for any discharge medications will be authorized once you are discharged, as it is imperative that you return to your primary care physician (or establish a relationship with a primary care physician if you do not have one) for your aftercare needs so that they can reassess your need for medications and monitor your lab values.

## 2014-12-23 NOTE — Progress Notes (Signed)
Per Md Paraschos order a Lext scan stress test for this patient

## 2014-12-27 ENCOUNTER — Emergency Department: Payer: Self-pay

## 2014-12-27 ENCOUNTER — Inpatient Hospital Stay
Admission: EM | Admit: 2014-12-27 | Discharge: 2014-12-28 | DRG: 287 | Disposition: A | Discharge status: Home / Self Care | Payer: Self-pay | Attending: Internal Medicine | Admitting: Internal Medicine

## 2014-12-27 ENCOUNTER — Encounter: Payer: Self-pay | Admitting: Emergency Medicine

## 2014-12-27 DIAGNOSIS — Z955 Presence of coronary angioplasty implant and graft: Secondary | ICD-10-CM

## 2014-12-27 DIAGNOSIS — I25110 Atherosclerotic heart disease of native coronary artery with unstable angina pectoris: Principal | ICD-10-CM | POA: Diagnosis present

## 2014-12-27 DIAGNOSIS — I252 Old myocardial infarction: Secondary | ICD-10-CM

## 2014-12-27 DIAGNOSIS — E669 Obesity, unspecified: Secondary | ICD-10-CM | POA: Diagnosis present

## 2014-12-27 DIAGNOSIS — I255 Ischemic cardiomyopathy: Secondary | ICD-10-CM | POA: Diagnosis present

## 2014-12-27 DIAGNOSIS — F17200 Nicotine dependence, unspecified, uncomplicated: Secondary | ICD-10-CM | POA: Diagnosis present

## 2014-12-27 DIAGNOSIS — E785 Hyperlipidemia, unspecified: Secondary | ICD-10-CM | POA: Diagnosis present

## 2014-12-27 DIAGNOSIS — Z7982 Long term (current) use of aspirin: Secondary | ICD-10-CM

## 2014-12-27 DIAGNOSIS — Z79899 Other long term (current) drug therapy: Secondary | ICD-10-CM

## 2014-12-27 DIAGNOSIS — R5381 Other malaise: Secondary | ICD-10-CM | POA: Diagnosis present

## 2014-12-27 DIAGNOSIS — R079 Chest pain, unspecified: Secondary | ICD-10-CM | POA: Diagnosis present

## 2014-12-27 DIAGNOSIS — Z6835 Body mass index (BMI) 35.0-35.9, adult: Secondary | ICD-10-CM

## 2014-12-27 DIAGNOSIS — I10 Essential (primary) hypertension: Secondary | ICD-10-CM | POA: Diagnosis present

## 2014-12-27 DIAGNOSIS — Z8249 Family history of ischemic heart disease and other diseases of the circulatory system: Secondary | ICD-10-CM

## 2014-12-27 DIAGNOSIS — I5022 Chronic systolic (congestive) heart failure: Secondary | ICD-10-CM | POA: Diagnosis present

## 2014-12-27 HISTORY — DX: Ischemic cardiomyopathy: I25.5

## 2014-12-27 HISTORY — DX: Chronic systolic (congestive) heart failure: I50.22

## 2014-12-27 LAB — CBC
HCT: 40.9 % (ref 40.0–52.0)
Hemoglobin: 13.9 g/dL (ref 13.0–18.0)
MCH: 32 pg (ref 26.0–34.0)
MCHC: 33.9 g/dL (ref 32.0–36.0)
MCV: 94.5 fL (ref 80.0–100.0)
Platelets: 171 10*3/uL (ref 150–440)
RBC: 4.33 MIL/uL — ABNORMAL LOW (ref 4.40–5.90)
RDW: 14.1 % (ref 11.5–14.5)
WBC: 4.3 10*3/uL (ref 3.8–10.6)

## 2014-12-27 LAB — BASIC METABOLIC PANEL
ANION GAP: 6 (ref 5–15)
BUN: 16 mg/dL (ref 6–20)
CO2: 28 mmol/L (ref 22–32)
CREATININE: 1.06 mg/dL (ref 0.61–1.24)
Calcium: 8.7 mg/dL — ABNORMAL LOW (ref 8.9–10.3)
Chloride: 104 mmol/L (ref 101–111)
GFR calc non Af Amer: >60 mL/min (ref >60)
GLUCOSE: 148 mg/dL — AB (ref 65–99)
POTASSIUM: 4 mmol/L (ref 3.5–5.1)
Sodium: 138 mmol/L (ref 135–145)

## 2014-12-27 LAB — TROPONIN I
Troponin I: <0.03 ng/mL (ref <0.031)
Troponin I: <0.03 ng/mL (ref <0.031)
Troponin I: <0.03 ng/mL (ref <0.031)

## 2014-12-27 MED ORDER — ENOXAPARIN SODIUM 40 MG/0.4ML ~~LOC~~ SOLN
40.0000 mg | SUBCUTANEOUS | Status: DC
  Administered 2014-12-27: 40 mg via SUBCUTANEOUS
  Filled 2014-12-27 (×2): qty 0.4

## 2014-12-27 MED ORDER — ASPIRIN 81 MG PO CHEW
81.0000 mg | CHEWABLE_TABLET | ORAL | Status: AC
  Administered 2014-12-28: 81 mg via ORAL
  Filled 2014-12-27: qty 1

## 2014-12-27 MED ORDER — ACETAMINOPHEN 650 MG RE SUPP: 650.0000 mg | Freq: Four times a day (QID) | RECTAL | Status: DC | PRN

## 2014-12-27 MED ORDER — ONDANSETRON HCL 4 MG PO TABS: 4.0000 mg | ORAL_TABLET | Freq: Four times a day (QID) | ORAL | Status: DC | PRN

## 2014-12-27 MED ORDER — METOPROLOL TARTRATE 25 MG PO TABS
12.5000 mg | ORAL_TABLET | Freq: Two times a day (BID) | ORAL | Status: DC
  Administered 2014-12-27 – 2014-12-28 (×2): 12.5 mg via ORAL
  Filled 2014-12-27 (×2): qty 1

## 2014-12-27 MED ORDER — SODIUM CHLORIDE 0.9 % WEIGHT BASED INFUSION
3.0000 mL/kg/h | INTRAVENOUS | Status: AC
  Administered 2014-12-28: 3 mL/kg/h via INTRAVENOUS

## 2014-12-27 MED ORDER — ASPIRIN 81 MG PO CHEW
324.0000 mg | CHEWABLE_TABLET | Freq: Once | ORAL | Status: AC
  Administered 2014-12-27: 324 mg via ORAL
  Filled 2014-12-27: qty 4

## 2014-12-27 MED ORDER — SODIUM CHLORIDE 0.9 % IV SOLN: 250.0000 mL | INTRAVENOUS | Status: DC | PRN

## 2014-12-27 MED ORDER — SODIUM CHLORIDE 0.9 % IJ SOLN
3.0000 mL | Freq: Two times a day (BID) | INTRAMUSCULAR | Status: DC
  Administered 2014-12-27: 3 mL via INTRAVENOUS

## 2014-12-27 MED ORDER — ACETAMINOPHEN 325 MG PO TABS
650.0000 mg | ORAL_TABLET | Freq: Four times a day (QID) | ORAL | Status: DC | PRN
  Administered 2014-12-27: 650 mg via ORAL
  Filled 2014-12-27: qty 2

## 2014-12-27 MED ORDER — ASPIRIN EC 81 MG PO TBEC
81.0000 mg | DELAYED_RELEASE_TABLET | Freq: Every day | ORAL | Status: DC
  Administered 2014-12-28: 81 mg via ORAL
  Filled 2014-12-27: qty 1

## 2014-12-27 MED ORDER — ONDANSETRON HCL 4 MG/2ML IJ SOLN: 4.0000 mg | Freq: Four times a day (QID) | INTRAMUSCULAR | Status: DC | PRN

## 2014-12-27 MED ORDER — SODIUM CHLORIDE 0.9 % IJ SOLN: 3.0000 mL | INTRAMUSCULAR | Status: DC | PRN

## 2014-12-27 MED ORDER — SODIUM CHLORIDE 0.9 % IJ SOLN: 3.0000 mL | Freq: Two times a day (BID) | INTRAMUSCULAR | Status: DC

## 2014-12-27 MED ORDER — NITROGLYCERIN 0.4 MG SL SUBL: 0.4000 mg | SUBLINGUAL_TABLET | SUBLINGUAL | Status: DC | PRN

## 2014-12-27 MED ORDER — OXYCODONE HCL 5 MG PO TABS
5.0000 mg | ORAL_TABLET | ORAL | Status: DC | PRN
  Administered 2014-12-28: 5 mg via ORAL
  Filled 2014-12-27: qty 1

## 2014-12-27 MED ORDER — PRAVASTATIN SODIUM 20 MG PO TABS
40.0000 mg | ORAL_TABLET | Freq: Every day | ORAL | Status: DC
  Administered 2014-12-27: 40 mg via ORAL
  Filled 2014-12-27 (×2): qty 2

## 2014-12-27 MED ORDER — ZOLPIDEM TARTRATE 5 MG PO TABS: 5.0000 mg | ORAL_TABLET | Freq: Once | ORAL | Status: DC

## 2014-12-27 MED ORDER — SODIUM CHLORIDE 0.9 % WEIGHT BASED INFUSION: 1.0000 mL/kg/h | INTRAVENOUS | Status: DC

## 2014-12-27 NOTE — ED Notes (Signed)
Says chest has n ever stopped hurting since last viist.  Says it gets better and worse, and gets better with ntg.   needst o have cath, but he has no insurance.  Called [facility] (saw parachos yesterday.)  And they said to come to er

## 2014-12-27 NOTE — ED Notes (Signed)
Has had constant dull pressure to left of sternum and through to shoulder blades since leaving hospital last Friday.  Has had dizziness but started metoprolol last Thursday.  Has had SHOB as well.  Reports he has not been pain free at all since left hospital.  Occasionally gets a sharp pain as well.

## 2014-12-27 NOTE — ED Provider Notes (Signed)
[facility] Emergency Department Provider Note  ____________________________________________  Time seen: Approximately 12:38 PM  I have reviewed the triage vital signs and the nursing notes.   HISTORY  Chief Complaint Chest Pain    HPI Bryce [name] is a 49 y.o. male with history of coronary artery disease status post stents, hypertension presents for evaluation of continued gradual onset sternal chest pain/cramping, often radiating to the back. He was seen in this emergency department on 12/22/2014 for similar symptoms and admitted. He had negative stress test and followed up with Dr. Darrold Junker yesterday who told him he would need a catheterization. He reports that he has had ongoing intermittent pain since discharge. This morning, his pain was quite severe, resolved with nitroglycerin. Current severity of pain is moderate. No modifying factors. He has been nauseated but has not vomited.   Past Medical History  Diagnosis Date  . MI (myocardial infarction)   . Hypertension   . Coronary artery disease     Patient Active Problem List   Diagnosis Date Noted  . Solitary pulmonary nodule 12/23/2014  . Chest pain 12/22/2014  . Essential hypertension 12/22/2014  . Hyperlipidemia 12/22/2014    Past Surgical History  Procedure Laterality Date  . Coronary angioplasty      Non-ST elevation MI status post stenting within bare-metal stent of the 75% lesion of the LAD, 11/13/2010.     Current Outpatient Rx  Name  Route  Sig  Dispense  Refill  . aspirin EC 81 MG tablet   Oral   Take 81 mg by mouth daily.         . metoprolol tartrate (LOPRESSOR) 25 MG tablet   Oral   Take 0.5 tablets (12.5 mg total) by mouth 2 (two) times daily.   60 tablet   0   . pravastatin (PRAVACHOL) 40 MG tablet   Oral   Take 1 tablet (40 mg total) by mouth daily.   30 tablet   0     Allergies Review of patient's allergies indicates no known allergies.  Family History   Problem Relation Age of Onset  . CAD Father     Social History History  Substance Use Topics  . Smoking status: Current Some Day Smoker -- 0.25 packs/day  . Smokeless tobacco: Not on file  . Alcohol Use: No    Review of Systems Constitutional: No fever/chills Eyes: No visual changes. ENT: No sore throat. Cardiovascular: + chest pain. Respiratory: + shortness of breath. Gastrointestinal: No abdominal pain.  + nausea, no vomiting.  No diarrhea.  No constipation. Genitourinary: Negative for dysuria. Musculoskeletal: Negative for back pain. Skin: Negative for rash. Neurological: Negative for headaches, focal weakness or numbness.  10-point ROS otherwise negative.  ____________________________________________   PHYSICAL EXAM:  VITAL SIGNS: ED Triage Vitals  Enc Vitals Group     BP 12/27/14 1118 117/57 mmHg     Pulse Rate 12/27/14 1118 59     Resp 12/27/14 1118 16     Temp 12/27/14 1118 97.9 F (36.6 C)     Temp Source 12/27/14 1118 Oral     SpO2 12/27/14 1118 98 %     Weight 12/27/14 1118 232 lb (105.235 kg)     Height 12/27/14 1118 5\' 8"  (1.727 m)     Head Cir --      Peak Flow --      Pain Score 12/27/14 1119 7     Pain Loc --      Pain Edu? --  Excl. in GC? --     Constitutional: Alert and oriented. Well appearing and in no acute distress. Eyes: Conjunctivae are normal. PERRL. EOMI. Head: Atraumatic. Nose: No congestion/rhinnorhea. Mouth/Throat: Mucous membranes are moist.  Oropharynx non-erythematous. Neck: No stridor.   Cardiovascular: Normal rate, regular rhythm. Grossly normal heart sounds.  Good peripheral circulation. Respiratory: Normal respiratory effort.  No retractions. Lungs CTAB. Gastrointestinal: Soft and nontender. No distention. No abdominal bruits. No CVA tenderness. Genitourinary: deferred Musculoskeletal: No lower extremity tenderness nor edema.  No joint effusions. Neurologic:  Normal speech and language. No gross focal neurologic  deficits are appreciated. No gait instability. Skin:  Skin is warm, dry and intact. No rash noted. Psychiatric: Mood and affect are normal. Speech and behavior are normal.  ____________________________________________   LABS (all labs ordered are listed, but only abnormal results are displayed)  Labs Reviewed  BASIC METABOLIC PANEL - Abnormal; Notable for the following:    Glucose, Bld 148 (*)    Calcium 8.7 (*)    All other components within normal limits  CBC - Abnormal; Notable for the following:    RBC 4.33 (*)    All other components within normal limits  TROPONIN I   ____________________________________________  EKG  ED ECG REPORT I, Gayla Doss, the attending physician, personally viewed and interpreted this ECG.   Date: 12/27/2014  EKG Time: 11:12  Rate: 61  Rhythm: normal sinus rhythm  Axis: normal  Intervals:none  ST&T Change: T wave flattening in lead 2, 3, aVF, T wave inversions in V5, V6. No acute ST segment elevation  ____________________________________________  RADIOLOGY  CXR  FINDINGS: The heart size and mediastinal contours are stable. The heart size is enlarged. There is no focal infiltrate, pulmonary edema, or pleural effusion. There is mild scar of right lung base unchanged. The visualized skeletal structures are stable.  IMPRESSION: No active cardiopulmonary disease.   ____________________________________________   PROCEDURES  Procedure(s) performed: None  Critical Care performed: No  ____________________________________________   INITIAL IMPRESSION / ASSESSMENT AND PLAN / ED COURSE  Pertinent labs & imaging results that were available during my care of the patient were reviewed by me and considered in my medical decision making (see chart for details).  Bryce [name] is a 49 y.o. male with history of coronary artery disease status post stents, hypertension presents for evaluation of continued gradual onset sternal chest  pain/cramping, often radiating to the back. On exam, he is generally well-appearing and in no acute distress. Vital signs notable for mild hypotension on arrival which improved to 109/76 without any intervention in the ER, mild bradycardia. No hypoxia. Exam is otherwise unremarkable. EKG concerning today for flattened T waves in the inferior leads, previously was notable for T wave inversion in the inferior leads as well as the lateral leads. First troponin is negative. Given evolving EKG changes, continued chest pain, history of coronary artery disease requiring stenting, I discussed the case with Dr. Darrold Junker (the patient's cardiologist) who recommends admission for likely inpatient catheterization. Discussed with hospitalist at 1:40 PM for admission. ____________________________________________   FINAL CLINICAL IMPRESSION(S) / ED DIAGNOSES  Final diagnoses:  Chest pain, unspecified chest pain type      Gayla Doss, MD 12/27/14 1344

## 2014-12-27 NOTE — H&P (Signed)
[facility] - [place] at [facility]   PATIENT NAME: Bryce [name]    MR#:  [id number]  DATE OF BIRTH:  March 07, 1966  DATE OF ADMISSION:  12/27/2014  PRIMARY CARE PHYSICIAN: No PCP Per Patient   REQUESTING/REFERRING PHYSICIAN: Dr. Inocencio Homes  CHIEF COMPLAINT:   Chief Complaint  Patient presents with  . Chest Pain    HISTORY OF PRESENT ILLNESS:  Bryce [name]  is a 49 y.o. male with a known history of CAD with PCI for years back, hypertension, hyperlipidemia who recently was admitted under observation for chest pain and discharged home after a negative stress test returns with ongoing symptoms. [facility] cardiology was contacted from emergency room and admission for cardiac catheterization has been recommended. Presently his EKG shows no change her to recent EKG. Troponin is normal. He does complain of mild dizziness since being started on low-dose metoprolol. He has been taking his aspirin and statin. Hasn't exerted since leaving the hospital. But states he feels completely washed out and fatigued. 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  He did see Dr. Adrienne Mocha yesterday in his office.  PAST MEDICAL HISTORY:   Past Medical History  Diagnosis Date  . MI (myocardial infarction)   . Hypertension   . Coronary artery disease   . Ischemic cardiomyopathy   . Chronic systolic congestive heart failure     PAST SURGICAL HISTORY:   Past Surgical History  Procedure Laterality Date  . Coronary angioplasty      Non-ST elevation MI status post stenting within bare-metal stent of the 75% lesion of the LAD, 11/13/2010.     SOCIAL HISTORY:   History  Substance Use Topics  . Smoking status: Current Some Day Smoker -- 0.25 packs/day  . Smokeless tobacco: Not on file  . Alcohol Use: No    FAMILY HISTORY:   Family History  Problem Relation Age of Onset  . CAD Father     DRUG ALLERGIES:  No  Known Allergies  REVIEW OF SYSTEMS:   Review of Systems  Constitutional: Positive for malaise/fatigue. Negative for fever, chills and weight loss.  HENT: Negative for hearing loss and nosebleeds.   Eyes: Negative for blurred vision, double vision and pain.  Respiratory: Negative for cough, hemoptysis, sputum production, shortness of breath and wheezing.   Cardiovascular: Positive for chest pain. Negative for palpitations, orthopnea and leg swelling.  Gastrointestinal: Negative for nausea, vomiting, abdominal pain, diarrhea and constipation.  Genitourinary: Negative for dysuria and hematuria.  Musculoskeletal: Negative for myalgias, back pain and falls.  Skin: Negative for rash.  Neurological: Negative for dizziness, tremors, sensory change, speech change, focal weakness, seizures and headaches.  Endo/Heme/Allergies: Does not bruise/bleed easily.  Psychiatric/Behavioral: Negative for depression and memory loss. The patient is not nervous/anxious.     MEDICATIONS AT HOME:   Prior to Admission medications   Medication Sig Start Date End Date Taking? Authorizing Provider  aspirin EC 81 MG tablet Take 81 mg by mouth daily.    Historical Provider, MD  metoprolol tartrate (LOPRESSOR) 25 MG tablet Take 0.5 tablets (12.5 mg total) by mouth 2 (two) times daily. 12/23/14   Gale Journey, MD  pravastatin (PRAVACHOL) 40 MG tablet Take 1 tablet (40 mg total) by mouth daily. 12/23/14   Gale Journey, MD      VITAL SIGNS:  Blood pressure 109/76, pulse 54, temperature 97.9 F (36.6 C), temperature source  Oral, resp. rate 19, height 5\' 8"  (1.727 m), weight 105.235 kg (232 lb), SpO2 94 %.  PHYSICAL EXAMINATION:  Physical Exam  GENERAL:  49 y.o.-year-old patient lying in the bed with no acute distress.  EYES: Pupils equal, round, reactive to light and accommodation. No scleral icterus. Extraocular muscles intact.  HEENT: Head atraumatic, normocephalic. Oropharynx and nasopharynx clear. No  oropharyngeal erythema, moist oral mucosa  NECK:  Supple, no jugular venous distention. No thyroid enlargement, no tenderness.  LUNGS: Normal breath sounds bilaterally, no wheezing, rales, rhonchi. No use of accessory muscles of respiration.  CARDIOVASCULAR: S1, S2 normal. No murmurs, rubs, or gallops.  ABDOMEN: Soft, nontender, nondistended. Bowel sounds present. No organomegaly or mass.  EXTREMITIES: No pedal edema, cyanosis, or clubbing. + 2 pedal & radial pulses b/l.   NEUROLOGIC: Cranial nerves II through XII are intact. No focal Motor or sensory deficits appreciated b/l PSYCHIATRIC: The patient is alert and oriented x 3. Good affect.  SKIN: No obvious rash, lesion, or ulcer.   LABORATORY PANEL:   CBC  Recent Labs Lab 12/27/14 1127  WBC 4.3  HGB 13.9  HCT 40.9  PLT 171   ------------------------------------------------------------------------------------------------------------------  Chemistries   Recent Labs Lab 12/22/14 0551  12/27/14 1127  NA 138  < > 138  K 3.7  < > 4.0  CL 104  < > 104  CO2 25  < > 28  GLUCOSE 117*  < > 148*  BUN 20  < > 16  CREATININE 1.06  < > 1.06  CALCIUM 9.3  < > 8.7*  AST 25  --   --   ALT 35  --   --   ALKPHOS 75  --   --   BILITOT 0.8  --   --   < > = values in this interval not displayed. ------------------------------------------------------------------------------------------------------------------  Cardiac Enzymes  Recent Labs Lab 12/27/14 1127  TROPONINI <0.03   ------------------------------------------------------------------------------------------------------------------  RADIOLOGY:  Dg Chest 2 View  12/27/2014   CLINICAL DATA:  Pressure between the shoulder blades, chest pain since last Wednesday.  EXAM: CHEST  2 VIEW  COMPARISON:  None.  FINDINGS: The heart size and mediastinal contours are stable. The heart size is enlarged. There is no focal infiltrate, pulmonary edema, or pleural effusion. There is mild scar of  right lung base unchanged. The visualized skeletal structures are stable.  IMPRESSION: No active cardiopulmonary disease.   Electronically Signed   By: Sherian Rein M.D.   On: 12/27/2014 11:52     IMPRESSION AND PLAN:   * Unstable angina Patient recently had a stress test showing no signs of ischemia. But due to his typical symptoms and ongoing chest pain is being admitted for cardiac catheterization. [facility] cardiology has been consult.. Nitroglycerin when necessary. Beta blockers and statins along with aspirin. We will initiate therapeutic heparin or Lovenox if troponins trend up.  * Chronic systolic CHF with ejection fraction of 35-40% No signs of fluid overload. Not on Lasix at home. Will need Ace inhibitors if his blood pressure can tolerate after metoprolol.  *Hypertension On metoprolol  *Hyperlipidemia On pravastatin  *DVT prophylaxis with Lovenox  All the records are reviewed and case discussed with ED provider. Management plans discussed with the patient, family and they are in agreement.  CODE STATUS: FULL  TOTAL TIME TAKING CARE OF THIS PATIENT: 40 minutes.    Milagros Loll R M.D on 12/27/2014 at 2:02 PM  Between 7am to 6pm - Pager - [phone]  After 6pm  go to [email or web address] - password [email or web address] [facility]  [facility]  Office  [phone]  CC: Primary care physician; No PCP Per Patient

## 2014-12-27 NOTE — ED Notes (Signed)
MD at bedside. 

## 2014-12-27 NOTE — Care Management CHF Note (Signed)
Patient recently discharged from [facility] after stay for chest pain.  His troponins were negative and stress was negative.  Presents with same and will have cardiac cath.  He has  cardiac history with PCI June 2012.  Patient is employed but does not have insurance

## 2014-12-27 NOTE — Progress Notes (Signed)
Patient seen by DR [facility] for cardiac cath in am , npo after midnight except meds , denies chest pain at this time

## 2014-12-27 NOTE — ED Notes (Signed)
Attempted to call report X 3.  RN phone won't ring when transferred.  Phone number given to floor so RN can call since trouble reaching her.

## 2014-12-28 ENCOUNTER — Encounter
Admission: EM | Disposition: A | Discharge status: Home / Self Care | Payer: Self-pay | Source: Home / Self Care | Attending: Internal Medicine

## 2014-12-28 ENCOUNTER — Encounter: Payer: Self-pay | Admitting: Internal Medicine

## 2014-12-28 HISTORY — PX: CARDIAC CATHETERIZATION: SHX172

## 2014-12-28 LAB — TROPONIN I: Troponin I: <0.03 ng/mL (ref <0.031)

## 2014-12-28 SURGERY — LEFT HEART CATH AND CORONARY ANGIOGRAPHY
Anesthesia: Moderate Sedation

## 2014-12-28 MED ORDER — SODIUM CHLORIDE 0.9 % WEIGHT BASED INFUSION: 3.0000 mL/kg/h | INTRAVENOUS | Status: DC

## 2014-12-28 MED ORDER — ONDANSETRON HCL 4 MG/2ML IJ SOLN: 4.0000 mg | Freq: Four times a day (QID) | INTRAMUSCULAR | Status: DC | PRN

## 2014-12-28 MED ORDER — MIDAZOLAM HCL 2 MG/2ML IJ SOLN
INTRAMUSCULAR | Status: AC
  Filled 2014-12-28: qty 2

## 2014-12-28 MED ORDER — SODIUM CHLORIDE 0.9 % IV SOLN: 250.0000 mL | INTRAVENOUS | Status: DC | PRN

## 2014-12-28 MED ORDER — MIDAZOLAM HCL 2 MG/2ML IJ SOLN
INTRAMUSCULAR | Status: DC | PRN
  Administered 2014-12-28: 1 mg via INTRAVENOUS

## 2014-12-28 MED ORDER — HEPARIN (PORCINE) IN NACL 2-0.9 UNIT/ML-% IJ SOLN
INTRAMUSCULAR | Status: AC
Start: 2014-12-28 — End: 2014-12-28
  Filled 2014-12-28: qty 1000

## 2014-12-28 MED ORDER — FENTANYL CITRATE (PF) 100 MCG/2ML IJ SOLN
INTRAMUSCULAR | Status: DC | PRN
  Administered 2014-12-28: 50 ug via INTRAVENOUS

## 2014-12-28 MED ORDER — LIDOCAINE HCL (PF) 1 % IJ SOLN
INTRAMUSCULAR | Status: DC | PRN
Start: 2014-12-28 — End: 2014-12-28
  Administered 2014-12-28: 20 mL via SUBCUTANEOUS

## 2014-12-28 MED ORDER — SODIUM CHLORIDE 0.9 % IJ SOLN: 3.0000 mL | INTRAMUSCULAR | Status: DC | PRN

## 2014-12-28 MED ORDER — IOHEXOL 300 MG/ML  SOLN
INTRAMUSCULAR | Status: DC | PRN
  Administered 2014-12-28: 30 mL via INTRA_ARTERIAL
  Administered 2014-12-28: 75 mL via INTRA_ARTERIAL

## 2014-12-28 MED ORDER — ACETAMINOPHEN 325 MG PO TABS: 650.0000 mg | ORAL_TABLET | ORAL | Status: DC | PRN

## 2014-12-28 MED ORDER — FENTANYL CITRATE (PF) 100 MCG/2ML IJ SOLN
INTRAMUSCULAR | Status: AC
  Filled 2014-12-28: qty 2

## 2014-12-28 MED ORDER — ZOLPIDEM TARTRATE 5 MG PO TABS
5.0000 mg | ORAL_TABLET | Freq: Once | ORAL | Status: AC
  Administered 2014-12-28: 5 mg via ORAL
  Filled 2014-12-28: qty 1

## 2014-12-28 MED ORDER — SODIUM CHLORIDE 0.9 % IJ SOLN: 3.0000 mL | Freq: Two times a day (BID) | INTRAMUSCULAR | Status: DC

## 2014-12-28 SURGICAL SUPPLY — 10 items
CATH INFINITI 5FR ANG PIGTAIL (CATHETERS) ×3 IMPLANT
CATH INFINITI 5FR JL4 (CATHETERS) ×3 IMPLANT
CATH INFINITI JR4 5F (CATHETERS) ×3 IMPLANT
DEVICE CLOSURE MYNXGRIP 5F (Vascular Products) ×3 IMPLANT
DEVICE SAFEGUARD 24CM (GAUZE/BANDAGES/DRESSINGS) ×3 IMPLANT
KIT MANI 3VAL PERCEP (MISCELLANEOUS) ×3 IMPLANT
NEEDLE PERC 18GX7CM (NEEDLE) ×3 IMPLANT
PACK CARDIAC CATH (CUSTOM PROCEDURE TRAY) ×3 IMPLANT
SHEATH AVANTI 5FR X 11CM (SHEATH) ×3 IMPLANT
WIRE EMERALD 3MM-J .035X150CM (WIRE) ×3 IMPLANT

## 2014-12-28 NOTE — Consult Note (Signed)
Reason for Consult:Unstable angina/ known coronary disease Referring Physician:  Dr Darvin Neighbours  hospitalist cardiologists Bryce [name] is an 49 y.o. male.  HPI:  The patient is a 49 year old male with known coronary disease history PCI and stent 2012 to the LAD he has hypertension hyperlipidemia presented with chest pain symptoms on and off patient id been seen recurrent symptoms found have a negative troponin but had persistent anginal symptoms made had a functional study recently but now has persistent anginal symptoms and may require admission and possible cardiac catheterization. Patient describes angina is midsternal some involvement of his arm and neck relatively moderate to severe persistent no blackout spells of syncope no palpitations  No tachycardia.  Past Medical History  Diagnosis Date  . MI (myocardial infarction)   . Hypertension   . Coronary artery disease   . Ischemic cardiomyopathy   . Chronic systolic congestive heart failure     Past Surgical History  Procedure Laterality Date  . Coronary angioplasty      Non-ST elevation MI status post stenting within bare-metal stent of the 75% lesion of the LAD, 11/13/2010.     Family History  Problem Relation Age of Onset  . CAD Father     Social History:  reports that he has been smoking.  He does not have any smokeless tobacco history on file. He reports that he does not drink alcohol or use illicit drugs.  Allergies: No Known Allergies  Medications: I have reviewed the patient's current medications.  Results for orders placed or performed during the hospital encounter of 12/27/14 (from the past 48 hour(s))  Basic metabolic panel     Status: Abnormal   Collection Time: 12/27/14 11:27 AM  Result Value Ref Range   Sodium 138 135 - 145 mmol/L   Potassium 4.0 3.5 - 5.1 mmol/L   Chloride 104 101 - 111 mmol/L   CO2 28 22 - 32 mmol/L   Glucose, Bld 148 (H) 65 - 99 mg/dL   BUN 16 6 - 20 mg/dL   Creatinine, Ser  1.06 0.61 - 1.24 mg/dL   Calcium 8.7 (L) 8.9 - 10.3 mg/dL   GFR calc non Af Amer >60 >60 mL/min   GFR calc Af Amer >60 >60 mL/min    Comment: (NOTE) The eGFR has been calculated using the CKD EPI equation. This calculation has not been validated in all clinical situations. eGFR's persistently <60 mL/min signify possible Chronic Kidney Disease.    Anion gap 6 5 - 15  CBC     Status: Abnormal   Collection Time: 12/27/14 11:27 AM  Result Value Ref Range   WBC 4.3 3.8 - 10.6 K/uL   RBC 4.33 (L) 4.40 - 5.90 MIL/uL   Hemoglobin 13.9 13.0 - 18.0 g/dL   HCT 40.9 40.0 - 52.0 %   MCV 94.5 80.0 - 100.0 fL   MCH 32.0 26.0 - 34.0 pg   MCHC 33.9 32.0 - 36.0 g/dL   RDW 14.1 11.5 - 14.5 %   Platelets 171 150 - 440 K/uL  Troponin I     Status: None   Collection Time: 12/27/14 11:27 AM  Result Value Ref Range   Troponin I <0.03 <0.031 ng/mL    Comment:        NO INDICATION OF MYOCARDIAL INJURY.   Troponin I     Status: None   Collection Time: 12/27/14  2:17 PM  Result Value Ref Range   Troponin I <0.03 <0.031 ng/mL  Comment:        NO INDICATION OF MYOCARDIAL INJURY.   Troponin I     Status: None   Collection Time: 12/27/14  8:16 PM  Result Value Ref Range   Troponin I <0.03 <0.031 ng/mL    Comment:        NO INDICATION OF MYOCARDIAL INJURY.   Troponin I     Status: None   Collection Time: 12/28/14  2:01 AM  Result Value Ref Range   Troponin I <0.03 <0.031 ng/mL    Comment:        NO INDICATION OF MYOCARDIAL INJURY.     Dg Chest 2 View  12/27/2014   CLINICAL DATA:  Pressure between the shoulder blades, chest pain since last Wednesday.  EXAM: CHEST  2 VIEW  COMPARISON:  None.  FINDINGS: The heart size and mediastinal contours are stable. The heart size is enlarged. There is no focal infiltrate, pulmonary edema, or pleural effusion. There is mild scar of right lung base unchanged. The visualized skeletal structures are stable.  IMPRESSION: No active cardiopulmonary disease.    Electronically Signed   By: Abelardo Diesel M.D.   On: 12/27/2014 11:52    Review of Systems  Constitutional: Negative.   HENT: Negative.   Eyes: Negative.   Respiratory: Positive for shortness of breath.   Cardiovascular: Positive for chest pain.  Gastrointestinal: Negative.   Genitourinary: Negative.   Musculoskeletal: Negative.   Skin: Negative.   Neurological: Negative.   Psychiatric/Behavioral: Negative.    Blood pressure 133/84, pulse 57, temperature 98.2 F (36.8 C), temperature source Oral, resp. rate 18, height '5\' 8"'  (1.727 m), weight 106.323 kg (234 lb 6.4 oz), SpO2 98 %. Physical Exam  Vitals reviewed. Constitutional: He is oriented to person, place, and time. He appears well-developed and well-nourished.  HENT:  Head: Normocephalic and atraumatic.  Eyes: Conjunctivae are normal. Pupils are equal, round, and reactive to light.  Neck: Normal range of motion. Neck supple.  Cardiovascular: Normal rate, regular rhythm, normal heart sounds and intact distal pulses.   Respiratory: Effort normal and breath sounds normal.  GI: Soft. Bowel sounds are normal.  Musculoskeletal: Normal range of motion.  Neurological: He is alert and oriented to person, place, and time.  Skin: Skin is warm and dry.  Psychiatric: He has a normal mood and affect.    Assessment/Plan:  unstable angina  coronary disease  hypertension   Hyper lipidemia  ischemic cardiomyopathy  chronic systolic congestive heart failure  history of myocardial infarction  PCI and stent to LAD in 2012  borderline obesity  vertigo . PLAN  rule out myocardial infarction  continue telemetry and EKGs  anticoagulation short-term with heparin or Lovenox  continue beta-blockers for hypertension and angina  Continue  Aspirin therapy for stroke coronary disease  primary should be continued for hyperlipidemia  consider cardiac catheterization for evaluation unstable angina symptoms  Bryce [name] D. 12/28/2014,  11:53 AM

## 2014-12-28 NOTE — Discharge Summary (Signed)
[facility] - [place] at [facility]   PATIENT NAME: Bryce [name]    MR#:  [id number]  DATE OF BIRTH:  1965/07/08  DATE OF ADMISSION:  12/27/2014 ADMITTING PHYSICIAN: Milagros Loll, MD  DATE OF DISCHARGE: 12/28/2014  PRIMARY CARE PHYSICIAN: No PCP Per Patient    ADMISSION DIAGNOSIS:  Chest pain, unspecified chest pain type [R07.9]  DISCHARGE DIAGNOSIS:  Active Problems:   Chest pain   SECONDARY DIAGNOSIS:   Past Medical History  Diagnosis Date  . MI (myocardial infarction)   . Hypertension   . Coronary artery disease   . Ischemic cardiomyopathy   . Chronic systolic congestive heart failure     HOSPITAL COURSE:   1. Chest pain- patient had a recent hospital admission with a negative stress test and came in with repeated chest pain. He had a cardiac catheterization that showed a patent stent and minimal heart disease. He will continue on aspirin, metoprolol and pravastatin. Cardiac enzymes were negative. He was seen this morning and did have chest pain prior to the catheter but now is chest pain-free. Just knowing that he has no blockages probably helped out. 2. Essential hypertension- continue metoprolol. 3 hyperlipidemia unspecified- continue statin.  DISCHARGE CONDITIONS:   Satisfactory  CONSULTS OBTAINED:  Treatment Team:  Marcina Millard, MD  DRUG ALLERGIES:  No Known Allergies  DISCHARGE MEDICATIONS:   Current Discharge Medication List    CONTINUE these medications which have NOT CHANGED   Details  aspirin EC 81 MG tablet Take 81 mg by mouth daily.    metoprolol tartrate (LOPRESSOR) 25 MG tablet Take 0.5 tablets (12.5 mg total) by mouth 2 (two) times daily. Qty: 60 tablet, Refills: 0    pravastatin (PRAVACHOL) 40 MG tablet Take 1 tablet (40 mg total) by mouth daily. Qty: 30 tablet, Refills: 0         DISCHARGE INSTRUCTIONS:   Follow-up with Dr. Juliann Pares one week.  If you experience worsening of your admission  symptoms, develop shortness of breath, life threatening emergency, suicidal or homicidal thoughts you must seek medical attention immediately by calling 911 or calling your MD immediately  if symptoms less severe.  You Must read complete instructions/literature along with all the possible adverse reactions/side effects for all the Medicines you take and that have been prescribed to you. Take any new Medicines after you have completely understood and accept all the possible adverse reactions/side effects.   Please note  You were cared for by a hospitalist during your hospital stay. If you have any questions about your discharge medications or the care you received while you were in the hospital after you are discharged, you can call the unit and asked to speak with the hospitalist on call if the hospitalist that took care of you is not available. Once you are discharged, your primary care physician will handle any further medical issues. Please note that NO REFILLS for any discharge medications will be authorized once you are discharged, as it is imperative that you return to your primary care physician (or establish a relationship with a primary care physician if you do not have one) for your aftercare needs so that they can reassess your need for medications and monitor your lab values.    Today   CHIEF COMPLAINT:   Chief Complaint  Patient presents with  . Chest Pain    HISTORY OF PRESENT ILLNESS:  Bryce [name]  is a 49 y.o. male with a known history of CAD presents back with  chest pain. Recent negative stress test. Admitted for cardiac catheterization.   VITAL SIGNS:  Blood pressure 110/75, pulse 66, temperature 98.2 F (36.8 C), temperature source Oral, resp. rate 16, height  (1.727 m), weight 106.323 kg (234 lb 6.4 oz), SpO2 96 %.   PHYSICAL EXAMINATION:  GENERAL:  49 y.o.-year-old patient lying in the bed with no acute distress.  EYES: Pupils equal, round, reactive to light  and accommodation. No scleral icterus. Extraocular muscles intact.  HEENT: Head atraumatic, normocephalic. Oropharynx and nasopharynx clear.  NECK:  Supple, no jugular venous distention. No thyroid enlargement, no tenderness.  LUNGS: Normal breath sounds bilaterally, no wheezing, rales,rhonchi or crepitation. No use of accessory muscles of respiration.  CARDIOVASCULAR: S1, S2 normal. No murmurs, rubs, or gallops.  ABDOMEN: Soft, non-tender, non-distended. Bowel sounds present. No organomegaly or mass.  EXTREMITIES: No pedal edema, cyanosis, or clubbing.  NEUROLOGIC: Cranial nerves II through XII are intact. Muscle strength 5/5 in all extremities. Sensation intact. Gait not checked.  PSYCHIATRIC: The patient is alert and oriented x 3.  SKIN: No obvious rash, lesion, or ulcer.   DATA REVIEW:   CBC  Recent Labs Lab 12/27/14 1127  WBC 4.3  HGB 13.9  HCT 40.9  PLT 171    Chemistries   Recent Labs Lab 12/22/14 0551  12/27/14 1127  NA 138  < > 138  K 3.7  < > 4.0  CL 104  < > 104  CO2 25  < > 28  GLUCOSE 117*  < > 148*  BUN 20  < > 16  CREATININE 1.06  < > 1.06  CALCIUM 9.3  < > 8.7*  AST 25  --   --   ALT 35  --   --   ALKPHOS 75  --   --   BILITOT 0.8  --   --   < > = values in this interval not displayed.  Cardiac Enzymes  Recent Labs Lab 12/28/14 0201  TROPONINI <0.03    Microbiology Results  No results found for this or any previous visit.  RADIOLOGY:  Dg Chest 2 View  12/27/2014   CLINICAL DATA:  Pressure between the shoulder blades, chest pain since last Wednesday.  EXAM: CHEST  2 VIEW  COMPARISON:  None.  FINDINGS: The heart size and mediastinal contours are stable. The heart size is enlarged. There is no focal infiltrate, pulmonary edema, or pleural effusion. There is mild scar of right lung base unchanged. The visualized skeletal structures are stable.  IMPRESSION: No active cardiopulmonary disease.   Electronically Signed   By: Sherian Rein M.D.   On:  12/27/2014 11:52   Management plans discussed with the patient, and he is in agreement.  CODE STATUS:     Code Status Orders        Start     Ordered   12/28/14 1310  Full code   Continuous     12/28/14 1313      TOTAL TIME TAKING CARE OF THIS PATIENT: 40 minutes.    Alford Highland M.D on 12/28/2014 at 2:54 PM  Between 7am to 6pm - Pager - [phone]  After 6pm go to [email or web address] - password [email or web address] [facility]  [place] [facility]  Office  [phone]  CC: Primary care physician; No PCP Per Patient  Cardiologist; Dr. Dorothyann Peng.

## 2014-12-28 NOTE — Progress Notes (Signed)
Patient remained hemodynamically stable w/o any distress. Patient denied pain, N&V or any discomfort. Patient c/o of insomnia and anxiety. Dr. Ramonita Lab was informed and one time dose of  Ativan PO was administered per order. Patient was kept NPO for heart cath in AM. Will continue to monitor.

## 2014-12-28 NOTE — Progress Notes (Signed)
Discharge instructions given along with vascular access site instructions and chest pain education. No new medications. Both IV's and telemetry box removed. No further questions at this time. Patient is set up to follow up with Dr. Cassie Freer.

## 2014-12-28 NOTE — Progress Notes (Signed)
Called report to nurse leah

## 2014-12-28 NOTE — Discharge Instructions (Signed)

## 2015-03-02 ENCOUNTER — Emergency Department: Payer: Self-pay

## 2015-03-02 ENCOUNTER — Emergency Department
Admission: EM | Admit: 2015-03-02 | Discharge: 2015-03-02 | Disposition: A | Discharge status: Home / Self Care | Payer: Self-pay | Attending: Emergency Medicine | Admitting: Emergency Medicine

## 2015-03-02 DIAGNOSIS — W208XXA Other cause of strike by thrown, projected or falling object, initial encounter: Secondary | ICD-10-CM | POA: Insufficient documentation

## 2015-03-02 DIAGNOSIS — Z791 Long term (current) use of non-steroidal anti-inflammatories (NSAID): Secondary | ICD-10-CM | POA: Insufficient documentation

## 2015-03-02 DIAGNOSIS — Z7982 Long term (current) use of aspirin: Secondary | ICD-10-CM | POA: Insufficient documentation

## 2015-03-02 DIAGNOSIS — Z79899 Other long term (current) drug therapy: Secondary | ICD-10-CM | POA: Insufficient documentation

## 2015-03-02 DIAGNOSIS — Y998 Other external cause status: Secondary | ICD-10-CM | POA: Insufficient documentation

## 2015-03-02 DIAGNOSIS — Z720 Tobacco use: Secondary | ICD-10-CM | POA: Insufficient documentation

## 2015-03-02 DIAGNOSIS — I10 Essential (primary) hypertension: Secondary | ICD-10-CM | POA: Insufficient documentation

## 2015-03-02 DIAGNOSIS — S40022A Contusion of left upper arm, initial encounter: Secondary | ICD-10-CM | POA: Insufficient documentation

## 2015-03-02 DIAGNOSIS — Y9289 Other specified places as the place of occurrence of the external cause: Secondary | ICD-10-CM | POA: Insufficient documentation

## 2015-03-02 DIAGNOSIS — Y9389 Activity, other specified: Secondary | ICD-10-CM | POA: Insufficient documentation

## 2015-03-02 MED ORDER — NAPROXEN 500 MG PO TBEC
500.0000 mg | DELAYED_RELEASE_TABLET | Freq: Two times a day (BID) | ORAL | Status: DC
Start: 2015-03-02 — End: 2015-12-15

## 2015-03-02 MED ORDER — KETOROLAC TROMETHAMINE 60 MG/2ML IM SOLN
60.0000 mg | Freq: Once | INTRAMUSCULAR | Status: AC
  Administered 2015-03-02: 60 mg via INTRAMUSCULAR
  Filled 2015-03-02: qty 2

## 2015-03-02 MED ORDER — HYDROCODONE-ACETAMINOPHEN 5-325 MG PO TABS: 1.0000 | ORAL_TABLET | ORAL | Status: DC | PRN

## 2015-03-02 NOTE — Discharge Instructions (Signed)
Contusion °A contusion is a deep bruise. Contusions are the result of an injury that caused bleeding under the skin. The contusion may turn blue, purple, or yellow. Minor injuries will give you a painless contusion, but more severe contusions may stay painful and swollen for a few weeks.  °CAUSES  °A contusion is usually caused by a blow, trauma, or direct force to an area of the body. °SYMPTOMS  °· Swelling and redness of the injured area. °· Bruising of the injured area. °· Tenderness and soreness of the injured area. °· Pain. °DIAGNOSIS  °The diagnosis can be made by taking a history and physical exam. An X-ray, CT scan, or MRI may be needed to determine if there were any associated injuries, such as fractures. °TREATMENT  °Specific treatment will depend on what area of the body was injured. In general, the best treatment for a contusion is resting, icing, elevating, and applying cold compresses to the injured area. Over-the-counter medicines may also be recommended for pain control. Ask your caregiver what the best treatment is for your contusion. °HOME CARE INSTRUCTIONS  °· Put ice on the injured area. °¨ Put ice in a plastic bag. °¨ Place a towel between your skin and the bag. °¨ Leave the ice on for 15-20 minutes, 3-4 times a day, or as directed by your health care provider. °· Only take over-the-counter or prescription medicines for pain, discomfort, or fever as directed by your caregiver. Your caregiver may recommend avoiding anti-inflammatory medicines (aspirin, ibuprofen, and naproxen) for 48 hours because these medicines may increase bruising. °· Rest the injured area. °· If possible, elevate the injured area to reduce swelling. °SEEK IMMEDIATE MEDICAL CARE IF:  °· You have increased bruising or swelling. °· You have pain that is getting worse. °· Your swelling or pain is not relieved with medicines. °MAKE SURE YOU:  °· Understand these instructions. °· Will watch your condition. °· Will get help right  away if you are not doing well or get worse. °Document Released: 02/27/2005 Document Revised: 05/25/2013 Document Reviewed: 03/25/2011 °[employer]® Patient Information ©2015 [employer]. This information is not intended to replace advice given to you by your health care provider. Make sure you discuss any questions you have with your health care provider. ° °
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
## 2015-03-02 NOTE — ED Provider Notes (Signed)
[facility] Emergency Department Provider Note  ____________________________________________  Time seen: Approximately 11:45 AM  I have reviewed the triage vital signs and the nursing notes.   HISTORY  Chief Complaint Arm Injury    HPI Bryce [name] is a 49 y.o. male who presents for evaluation of left arm pain since 7:30 last night. Patient states to instill rod fell approximately 10 feet landing on his left forearm complains of increased pain and bruising to the   Past Medical History  Diagnosis Date  . MI (myocardial infarction)   . Hypertension   . Coronary artery disease   . Ischemic cardiomyopathy   . Chronic systolic congestive heart failure     Patient Active Problem List   Diagnosis Date Noted  . Solitary pulmonary nodule 12/23/2014  . Chest pain 12/22/2014  . Essential hypertension 12/22/2014  . Hyperlipidemia 12/22/2014    Past Surgical History  Procedure Laterality Date  . Coronary angioplasty      Non-ST elevation MI status post stenting within bare-metal stent of the 75% lesion of the LAD, 11/13/2010.   . Cardiac catheterization N/A 12/28/2014    Procedure: Left Heart Cath and Coronary Angiography;  Surgeon: Alwyn Pea, MD;  Location: [facility] INVASIVE CV LAB;  Service: Cardiovascular;  Laterality: N/A;    Current Outpatient Rx  Name  Route  Sig  Dispense  Refill  . aspirin EC 81 MG tablet   Oral   Take 81 mg by mouth daily.         Marland Kitchen HYDROcodone-acetaminophen (NORCO) 5-325 MG tablet   Oral   Take 1-2 tablets by mouth every 4 (four) hours as needed for moderate pain.   8 tablet   0   . metoprolol tartrate (LOPRESSOR) 25 MG tablet   Oral   Take 0.5 tablets (12.5 mg total) by mouth 2 (two) times daily.   60 tablet   0   . naproxen (EC NAPROSYN) 500 MG EC tablet   Oral   Take 1 tablet (500 mg total) by mouth 2 (two) times daily with a meal.   60 tablet   0   . pravastatin (PRAVACHOL) 40 MG tablet   Oral  Take 1 tablet (40 mg total) by mouth daily.   30 tablet   0     Allergies Review of patient's allergies indicates no known allergies.  Family History  Problem Relation Age of Onset  . CAD Father     Social History Social History  Substance Use Topics  . Smoking status: Current Some Day Smoker -- 0.25 packs/day  . Smokeless tobacco: None  . Alcohol Use: No    Review of Systems Constitutional: No fever/chills Eyes: No visual changes. ENT: No sore throat. Cardiovascular: Denies chest pain. Respiratory: Denies shortness of breath. Gastrointestinal: No abdominal pain.  No nausea, no vomiting.  No diarrhea.  No constipation. Genitourinary: Negative for dysuria. Musculoskeletal: Positive for left forearm contusion. Skin: Negative for rash. Neurological: Negative for headaches, focal weakness or numbness.  10-point ROS otherwise negative.  ____________________________________________   PHYSICAL EXAM:  VITAL SIGNS: ED Triage Vitals  Enc Vitals Group     BP 03/02/15 1115 115/81 mmHg     Pulse Rate 03/02/15 1115 95     Resp 03/02/15 1115 16     Temp 03/02/15 1115 98.2 F (36.8 C)     Temp Source 03/02/15 1115 Oral     SpO2 03/02/15 1115 95 %     Weight 03/02/15 1115 225  lb (102.059 kg)     Height 03/02/15 1115  (1.727 m)     Head Cir --      Peak Flow --      Pain Score 03/02/15 1118 7     Pain Loc --      Pain Edu? --      Excl. in GC? --     Constitutional: Alert and oriented. Well appearing and in no acute distress.  Cardiovascular: Normal rate, regular rhythm. Grossly normal heart sounds.  Good peripheral circulation. Respiratory: Normal respiratory effort.  No retractions. Lungs CTAB. Musculoskeletal: No lower extremity tenderness nor edema.  No joint effusions. Full range of motion left arm and hand. Distal neurovascularly intact. Minimal ecchymosis noted. Neurologic:  Normal speech and language. No gross focal neurologic deficits are appreciated. No  gait instability. Skin:  Skin is warm, dry and intact. No rash noted. Psychiatric: Mood and affect are normal. Speech and behavior are normal.  ____________________________________________   LABS (all labs ordered are listed, but only abnormal results are displayed)  Labs Reviewed - No data to display   RADIOLOGY  Left forearm negative for fracture positive soft tissue swelling. Interpreted by radiologist and reviewed by myself. ____________________________________________   PROCEDURES  Procedure(s) performed: None  Critical Care performed: No  ____________________________________________   INITIAL IMPRESSION / ASSESSMENT AND PLAN / ED COURSE  Pertinent labs & imaging results that were available during my care of the patient were reviewed by me and considered in my medical decision making (see chart for details).  Left forearm contusion. Rx given for Naprosyn 500 mg twice a day and hydrocodone 5/325 #8. Patient follow-up with PCP and return to the ER if needed. Other emergency medical complaints at this time. ____________________________________________   FINAL CLINICAL IMPRESSION(S) / ED DIAGNOSES  Final diagnoses:  Arm contusion, left, initial encounter      Evangeline Dakin, PA-C 03/02/15 1228  Jene Every, MD 03/02/15 1415

## 2015-12-15 ENCOUNTER — Emergency Department: Payer: Self-pay

## 2015-12-15 ENCOUNTER — Encounter: Payer: Self-pay | Admitting: Emergency Medicine

## 2015-12-15 ENCOUNTER — Observation Stay
Admission: EM | Admit: 2015-12-15 | Discharge: 2015-12-16 | Disposition: A | Discharge status: Home / Self Care | Payer: Self-pay | Attending: Internal Medicine | Admitting: Internal Medicine

## 2015-12-15 DIAGNOSIS — N179 Acute kidney failure, unspecified: Secondary | ICD-10-CM

## 2015-12-15 DIAGNOSIS — Z7982 Long term (current) use of aspirin: Secondary | ICD-10-CM | POA: Insufficient documentation

## 2015-12-15 DIAGNOSIS — I5022 Chronic systolic (congestive) heart failure: Secondary | ICD-10-CM | POA: Insufficient documentation

## 2015-12-15 DIAGNOSIS — Z79899 Other long term (current) drug therapy: Secondary | ICD-10-CM | POA: Insufficient documentation

## 2015-12-15 DIAGNOSIS — E785 Hyperlipidemia, unspecified: Secondary | ICD-10-CM | POA: Insufficient documentation

## 2015-12-15 DIAGNOSIS — I2510 Atherosclerotic heart disease of native coronary artery without angina pectoris: Secondary | ICD-10-CM | POA: Insufficient documentation

## 2015-12-15 DIAGNOSIS — I252 Old myocardial infarction: Secondary | ICD-10-CM | POA: Insufficient documentation

## 2015-12-15 DIAGNOSIS — I255 Ischemic cardiomyopathy: Secondary | ICD-10-CM | POA: Insufficient documentation

## 2015-12-15 DIAGNOSIS — I10 Essential (primary) hypertension: Secondary | ICD-10-CM | POA: Insufficient documentation

## 2015-12-15 DIAGNOSIS — F17200 Nicotine dependence, unspecified, uncomplicated: Secondary | ICD-10-CM | POA: Insufficient documentation

## 2015-12-15 DIAGNOSIS — R079 Chest pain, unspecified: Principal | ICD-10-CM | POA: Diagnosis present

## 2015-12-15 DIAGNOSIS — I110 Hypertensive heart disease with heart failure: Secondary | ICD-10-CM | POA: Insufficient documentation

## 2015-12-15 DIAGNOSIS — Z7902 Long term (current) use of antithrombotics/antiplatelets: Secondary | ICD-10-CM | POA: Insufficient documentation

## 2015-12-15 DIAGNOSIS — R072 Precordial pain: Secondary | ICD-10-CM

## 2015-12-15 DIAGNOSIS — Z8249 Family history of ischemic heart disease and other diseases of the circulatory system: Secondary | ICD-10-CM | POA: Insufficient documentation

## 2015-12-15 LAB — BASIC METABOLIC PANEL
ANION GAP: 13 (ref 5–15)
BUN: 21 mg/dL — ABNORMAL HIGH (ref 6–20)
CO2: 21 mmol/L — ABNORMAL LOW (ref 22–32)
Calcium: 11.3 mg/dL — ABNORMAL HIGH (ref 8.9–10.3)
Chloride: 100 mmol/L — ABNORMAL LOW (ref 101–111)
Creatinine, Ser: 2.93 mg/dL — ABNORMAL HIGH (ref 0.61–1.24)
GFR, EST AFRICAN AMERICAN: 27 mL/min — AB (ref >60)
GFR, EST NON AFRICAN AMERICAN: 23 mL/min — AB (ref >60)
Glucose, Bld: 85 mg/dL (ref 65–99)
POTASSIUM: 3.9 mmol/L (ref 3.5–5.1)
SODIUM: 134 mmol/L — AB (ref 135–145)

## 2015-12-15 LAB — CBC WITH DIFFERENTIAL/PLATELET
BASOS ABS: 0.1 10*3/uL (ref 0–0.1)
BASOS PCT: 1 %
Eosinophils Absolute: 0.1 10*3/uL (ref 0–0.7)
Eosinophils Relative: 1 %
HEMATOCRIT: 52.5 % — AB (ref 40.0–52.0)
HEMOGLOBIN: 18 g/dL (ref 13.0–18.0)
Lymphocytes Relative: 22 %
Lymphs Abs: 1.9 10*3/uL (ref 1.0–3.6)
MCH: 32.5 pg (ref 26.0–34.0)
MCHC: 34.3 g/dL (ref 32.0–36.0)
MCV: 94.9 fL (ref 80.0–100.0)
MONO ABS: 0.9 10*3/uL (ref 0.2–1.0)
Monocytes Relative: 11 %
NEUTROS ABS: 5.9 10*3/uL (ref 1.4–6.5)
NEUTROS PCT: 65 %
Platelets: 256 10*3/uL (ref 150–440)
RBC: 5.53 MIL/uL (ref 4.40–5.90)
RDW: 14.6 % — AB (ref 11.5–14.5)
WBC: 8.9 10*3/uL (ref 3.8–10.6)

## 2015-12-15 LAB — TROPONIN I: Troponin I: <0.03 ng/mL (ref <0.03)

## 2015-12-15 MED ORDER — METOPROLOL TARTRATE 25 MG PO TABS
12.5000 mg | ORAL_TABLET | Freq: Two times a day (BID) | ORAL | Status: DC
  Administered 2015-12-15 – 2015-12-16 (×2): 12.5 mg via ORAL
  Filled 2015-12-15 (×2): qty 1

## 2015-12-15 MED ORDER — SODIUM CHLORIDE 0.9 % IV BOLUS (SEPSIS)
1000.0000 mL | Freq: Once | INTRAVENOUS | Status: AC
  Administered 2015-12-15: 1000 mL via INTRAVENOUS

## 2015-12-15 MED ORDER — ONDANSETRON HCL 4 MG/2ML IJ SOLN: 4.0000 mg | Freq: Four times a day (QID) | INTRAMUSCULAR | Status: DC | PRN

## 2015-12-15 MED ORDER — ASPIRIN 81 MG PO CHEW
324.0000 mg | CHEWABLE_TABLET | Freq: Once | ORAL | Status: AC
  Administered 2015-12-15: 324 mg via ORAL
  Filled 2015-12-15: qty 4

## 2015-12-15 MED ORDER — ONDANSETRON HCL 4 MG/2ML IJ SOLN
INTRAMUSCULAR | Status: AC
  Administered 2015-12-15: 4 mg via INTRAVENOUS
  Filled 2015-12-15: qty 2

## 2015-12-15 MED ORDER — OXYCODONE HCL 5 MG PO TABS: 5.0000 mg | ORAL_TABLET | ORAL | Status: DC | PRN

## 2015-12-15 MED ORDER — ACETAMINOPHEN 325 MG PO TABS
650.0000 mg | ORAL_TABLET | Freq: Four times a day (QID) | ORAL | Status: DC | PRN
Start: 2015-12-15 — End: 2015-12-16
  Administered 2015-12-15: 650 mg via ORAL
  Filled 2015-12-15: qty 2

## 2015-12-15 MED ORDER — ACETAMINOPHEN 650 MG RE SUPP: 650.0000 mg | Freq: Four times a day (QID) | RECTAL | Status: DC | PRN

## 2015-12-15 MED ORDER — NITROGLYCERIN 0.4 MG SL SUBL
0.4000 mg | SUBLINGUAL_TABLET | SUBLINGUAL | Status: DC | PRN
  Administered 2015-12-15 (×2): 0.4 mg via SUBLINGUAL
  Filled 2015-12-15 (×2): qty 1

## 2015-12-15 MED ORDER — ONDANSETRON HCL 4 MG PO TABS: 4.0000 mg | ORAL_TABLET | Freq: Four times a day (QID) | ORAL | Status: DC | PRN

## 2015-12-15 MED ORDER — HEPARIN SODIUM (PORCINE) 5000 UNIT/ML IJ SOLN
5000.0000 [IU] | Freq: Three times a day (TID) | INTRAMUSCULAR | Status: DC
  Administered 2015-12-15 – 2015-12-16 (×2): 5000 [IU] via SUBCUTANEOUS
  Filled 2015-12-15 (×2): qty 1

## 2015-12-15 MED ORDER — SODIUM CHLORIDE 0.9 % IV SOLN
INTRAVENOUS | Status: DC
  Administered 2015-12-15 – 2015-12-16 (×2): via INTRAVENOUS

## 2015-12-15 MED ORDER — SODIUM CHLORIDE 0.9% FLUSH
3.0000 mL | Freq: Two times a day (BID) | INTRAVENOUS | Status: DC
  Administered 2015-12-16: 3 mL via INTRAVENOUS

## 2015-12-15 MED ORDER — PRAVASTATIN SODIUM 40 MG PO TABS
40.0000 mg | ORAL_TABLET | Freq: Every day | ORAL | Status: DC
Start: 2015-12-15 — End: 2015-12-16
  Administered 2015-12-15 – 2015-12-16 (×2): 40 mg via ORAL
  Filled 2015-12-15 (×2): qty 1

## 2015-12-15 MED ORDER — ASPIRIN EC 81 MG PO TBEC
81.0000 mg | DELAYED_RELEASE_TABLET | Freq: Every day | ORAL | Status: DC
  Administered 2015-12-16: 81 mg via ORAL
  Filled 2015-12-15: qty 1

## 2015-12-15 MED ORDER — ONDANSETRON HCL 4 MG/2ML IJ SOLN
4.0000 mg | Freq: Once | INTRAMUSCULAR | Status: AC
  Administered 2015-12-15: 4 mg via INTRAVENOUS

## 2015-12-15 MED ORDER — KETOROLAC TROMETHAMINE 30 MG/ML IJ SOLN
15.0000 mg | INTRAMUSCULAR | Status: AC
  Administered 2015-12-15: 15 mg via INTRAVENOUS
  Filled 2015-12-15: qty 1

## 2015-12-15 MED ORDER — MORPHINE SULFATE (PF) 2 MG/ML IV SOLN: 2.0000 mg | INTRAVENOUS | Status: DC | PRN

## 2015-12-15 NOTE — H&P (Signed)
[facility]   PATIENT NAME: Bryce ArchRodney Amy    MR#:  [id number]  DATE OF BIRTH:  10-Oct-1965   DATE OF ADMISSION:  12/15/2015  PRIMARY CARE PHYSICIAN: Pcp Not In System   REQUESTING/REFERRING PHYSICIAN: Scotty CourtStafford Cardiology: Par[facility]  CHIEF COMPLAINT:   Chief Complaint  Patient presents with  . Chest Pain    HISTORY OF PRESENT ILLNESS:  Bryce [name]  is a 50 y.o. male with a known history of Coronary artery disease status post PCI and stent placement who is presenting with chest pain. Patient describes acute onset chest pain retrosternal in location without radiation cramping in quality no worsening or relieving factors no associated symptoms. Of note this happened after performing [employer] work outside for about 8 hours. Of note the patient has had prior hospitalizations in regards to heat stroke. On workup in emergency department found to have elevated creatinine. Currently chest pain-free  PAST MEDICAL HISTORY:   Past Medical History  Diagnosis Date  . MI (myocardial infarction) (HCC)   . Hypertension   . Coronary artery disease   . Ischemic cardiomyopathy   . Chronic systolic congestive heart failure (HCC)     PAST SURGICAL HISTORY:   Past Surgical History  Procedure Laterality Date  . Coronary angioplasty      Non-ST elevation MI status post stenting within bare-metal stent of the 75% lesion of the LAD, 11/13/2010.   . Cardiac catheterization N/A 12/28/2014    Procedure: Left Heart Cath and Coronary Angiography;  Surgeon: Alwyn Peawayne D Callwood, MD;  Location: [facility] INVASIVE CV LAB;  Service: Cardiovascular;  Laterality: N/A;    SOCIAL HISTORY:   Social History  Substance Use Topics  . Smoking status: Current Some Day Smoker -- 0.25 packs/day  . Smokeless tobacco: Not on file  . Alcohol Use: No    FAMILY HISTORY:   Family History  Problem Relation Age of Onset  . CAD Father     DRUG ALLERGIES:  No Known  Allergies  REVIEW OF SYSTEMS:  REVIEW OF SYSTEMS:  CONSTITUTIONAL: Denies fevers, chills, fatigue, weakness.  EYES: Denies blurred vision, double vision, or eye pain.  EARS, NOSE, THROAT: Denies tinnitus, ear pain, hearing loss.  RESPIRATORY: denies cough, shortness of breath, wheezing  CARDIOVASCULAR: Positive chest pain, denies palpitations, edema.  GASTROINTESTINAL: Denies nausea, vomiting, diarrhea, abdominal pain.  GENITOURINARY: Denies dysuria, hematuria.  ENDOCRINE: Denies nocturia or thyroid problems. HEMATOLOGIC AND LYMPHATIC: Denies easy bruising or bleeding.  SKIN: Denies rash or lesions.  MUSCULOSKELETAL: Denies pain in neck, back, shoulder, knees, hips, or further arthritic symptoms.  NEUROLOGIC: Denies paralysis, paresthesias.  PSYCHIATRIC: Denies anxiety or depressive symptoms. Otherwise full review of systems performed by me is negative.   MEDICATIONS AT HOME:   Prior to Admission medications   Medication Sig Start Date End Date Taking? Authorizing Provider  aspirin EC 81 MG tablet Take 81 mg by mouth daily.   Yes Historical Provider, MD  metoprolol tartrate (LOPRESSOR) 25 MG tablet Take 0.5 tablets (12.5 mg total) by mouth 2 (two) times daily. 12/23/14  Yes Gale Journeyatherine P Walsh, MD  pravastatin (PRAVACHOL) 40 MG tablet Take 1 tablet (40 mg total) by mouth daily. 12/23/14  Yes Gale Journeyatherine P Walsh, MD      VITAL SIGNS:  Blood pressure 109/68, pulse 81, temperature 98.2 F (36.8 C), temperature source Oral, resp. rate 22, height 5\' 7"  (1.702 m), weight 235 lb (106.595 kg), SpO2 98 %.  PHYSICAL EXAMINATION:  VITAL SIGNS: Filed Vitals:  12/15/15 1730 12/15/15 1800  BP: 111/72 109/68  Pulse: 88 81  Temp:    Resp:  22   GENERAL:50 y.o.male currently in no acute distress.  HEAD: Normocephalic, atraumatic.  EYES: Pupils equal, round, reactive to light. Extraocular muscles intact. No scleral icterus.  MOUTH: Moist mucosal membrane. Dentition intact. No abscess noted.   EAR, NOSE, THROAT: Clear without exudates. No external lesions.  NECK: Supple. No thyromegaly. No nodules. No JVD.  PULMONARY: Clear to ascultation, without wheeze rails or rhonci. No use of accessory muscles, Good respiratory effort. good air entry bilaterally CHEST: Nontender to palpation.  CARDIOVASCULAR: S1 and S2. Regular rate and rhythm. No murmurs, rubs, or gallops. No edema. Pedal pulses 2+ bilaterally.  GASTROINTESTINAL: Soft, nontender, nondistended. No masses. Positive bowel sounds. No hepatosplenomegaly.  MUSCULOSKELETAL: No swelling, clubbing, or edema. Range of motion full in all extremities.  NEUROLOGIC: Cranial nerves II through XII are intact. No gross focal neurological deficits. Sensation intact. Reflexes intact.  SKIN: No ulceration, lesions, rashes, or cyanosis. Skin warm and dry. Turgor intact.  PSYCHIATRIC: Mood, affect within normal limits. The patient is awake, alert and oriented x 3. Insight, judgment intact.    LABORATORY PANEL:   CBC  Recent Labs Lab 12/15/15 1602  WBC 8.9  HGB 18.0  HCT 52.5*  PLT 256   ------------------------------------------------------------------------------------------------------------------  Chemistries   Recent Labs Lab 12/15/15 1602  NA 134*  K 3.9  CL 100*  CO2 21*  GLUCOSE 85  BUN 21*  CREATININE 2.93*  CALCIUM 11.3*   ------------------------------------------------------------------------------------------------------------------  Cardiac Enzymes  Recent Labs Lab 12/15/15 1602  TROPONINI <0.03   ------------------------------------------------------------------------------------------------------------------  RADIOLOGY:  Dg Chest Portable 1 View  12/15/2015  CLINICAL DATA:  Chest pain radiating to back, history coronary artery disease post MI and coronary angioplasty, hypertension, ischemic cardiomyopathy, smoker EXAM: PORTABLE CHEST 1 VIEW COMPARISON:  Portable exam 1617 hours compared to 12/27/2014  FINDINGS: Upper normal heart size. Mediastinal contours and pulmonary vascularity normal. Lungs clear. No pleural effusion or pneumothorax. Bones unremarkable. IMPRESSION: No acute abnormalities. Electronically Signed   By: Ulyses Southward M.D.   On: 12/15/2015 16:31    EKG:   Orders placed or performed during the hospital encounter of 12/15/15  . EKG 12-Lead  . EKG 12-Lead    IMPRESSION AND PLAN:   50 year old Caucasian gentleman history of coronary artery disease status post PCI stent placement presenting with chest pain.  1. Acute kidney injury: Likely prerenal etiology given working outside in the heat, IV fluid hydration and follow urine output renal function 2.Chest pain, central: Initiate aspirin and statin therapy, admitted to telemetry, trend cardiac enzymes 3,  if continued elevation will initiate heparin drip ,nitroglycerin when necessary, morphine when necessary, consult cardiology follows with Parachos 3. Essential hypertension: Continue Lopressor       All the records are reviewed and case discussed with ED provider. Management plans discussed with the patient, family and they are in agreement.  CODE STATUS: Full  TOTAL TIME TAKING CARE OF THIS PATIENT: 33 minutes.    Jakota Manthei,  Mardi Mainland.D on 12/15/2015 at 7:07 PM  Between 7am to 6pm - Pager - [phone]  After 6pm: House Pager: - [phone]  [facility]  Office  [phone]  CC: Primary care physician; Pcp Not In System

## 2015-12-15 NOTE — Plan of Care (Signed)
Problem: Pain Managment: Goal: General experience of comfort will improve Outcome: Progressing Patient has no complaints of pain at this time.    

## 2015-12-15 NOTE — Plan of Care (Signed)
Problem: Safety: Goal: Ability to remain free from injury will improve Outcome: Progressing Discussed Patient Safety Plan and patient understands importance of calling for assistance. Bed alarm not indicated as patient is a moderate fall risk and call bell within reach.

## 2015-12-15 NOTE — Progress Notes (Signed)
Patient arrived to 2A Room 254. Patient denies pain and all questions answered. Patient oriented to unit and Fall Safety Plan signed. Skin assessment completed with Alisa RN and skin intact. A&Ox4, VSS, and NSR on verified tele box #40-20. Nursing staff will continue to monitor. Lamonte RicherKara A Braydon Kullman, RN

## 2015-12-15 NOTE — ED Notes (Signed)
Patient to ER for chest pain. Has h/o MI x3 with stent placement x2. 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 Patient very diaphoretic and now reporting chest pain. Patient states he began having cramping in hands and chest, with radiation of pain through to back.

## 2015-12-15 NOTE — ED Notes (Signed)
MD Scotty CourtStafford made aware of BP drop, holding off on 2nd nitrostat

## 2015-12-15 NOTE — ED Provider Notes (Signed)
[facility] Emergency Department Provider Note  ____________________________________________  Time seen: 3:55 PM  I have reviewed the triage vital signs and the nursing notes.   HISTORY  Chief Complaint Chest Pain    HPI Bryce [name] is a 50 y.o. male who complains of sudden onset of mid chest pain described as heaviness radiating to his back with vomiting and diaphoresis at onset that started suddenly and severely at 2 PM today. It is been persistent since then. He's been working outside [employer] all day since 6 AM. He reports he has a history of myocardial infarction 3 with stent placement 2. He had been on Plavix in the past for a year after stents, but has not been taking it anymore as his doctor discontinued it. His cardiologist is Dr. Darrold Junker. Last had a cardiac catheterization about a year ago, last had a nuclear medicine stress test a few months ago.No aggravating or alleviating factors. No abdominal pain or focal weakness.    Past Medical History  Diagnosis Date  . MI (myocardial infarction) (HCC)   . Hypertension   . Coronary artery disease   . Ischemic cardiomyopathy   . Chronic systolic congestive heart failure [facility])      Patient Active Problem List   Diagnosis Date Noted  . Solitary pulmonary nodule 12/23/2014  . Chest pain 12/22/2014  . Essential hypertension 12/22/2014  . Hyperlipidemia 12/22/2014     Past Surgical History  Procedure Laterality Date  . Coronary angioplasty      Non-ST elevation MI status post stenting within bare-metal stent of the 75% lesion of the LAD, 11/13/2010.   . Cardiac catheterization N/A 12/28/2014    Procedure: Left Heart Cath and Coronary Angiography;  Surgeon: Alwyn Pea, MD;  Location: [facility] INVASIVE CV LAB;  Service: Cardiovascular;  Laterality: N/A;     Current Outpatient Rx  Name  Route  Sig  Dispense  Refill  . aspirin EC 81 MG tablet   Oral   Take 81 mg by mouth daily.          . metoprolol tartrate (LOPRESSOR) 25 MG tablet   Oral   Take 0.5 tablets (12.5 mg total) by mouth 2 (two) times daily.   60 tablet   0   . pravastatin (PRAVACHOL) 40 MG tablet   Oral   Take 1 tablet (40 mg total) by mouth daily.   30 tablet   0      Allergies Review of patient's allergies indicates no known allergies.   Family History  Problem Relation Age of Onset  . CAD Father     Social History Social History  Substance Use Topics  . Smoking status: Current Some Day Smoker -- 0.25 packs/day  . Smokeless tobacco: None  . Alcohol Use: No    Review of Systems  Constitutional:   No fever or chills.  ENT:   No sore throat. No rhinorrhea. Cardiovascular:   Positive as above chest pain. Respiratory:   No dyspnea or cough. Gastrointestinal:   Negative for abdominal pain, vomiting and diarrhea.   Musculoskeletal:   Negative for focal pain or swelling Neurological:   Negative for headaches 10-point ROS otherwise negative.  ____________________________________________   PHYSICAL EXAM:  VITAL SIGNS: ED Triage Vitals  Enc Vitals Group     BP 12/15/15 1548 127/86 mmHg     Pulse Rate 12/15/15 1548 113     Resp 12/15/15 1548 22     Temp 12/15/15 1548 98.2 F (36.8  C)     Temp Source 12/15/15 1548 Oral     SpO2 12/15/15 1548 96 %     Weight 12/15/15 1548 235 lb (106.595 kg)     Height 12/15/15 1548 5\' 7"  (1.702 m)     Head Cir --      Peak Flow --      Pain Score 12/15/15 1548 9     Pain Loc --      Pain Edu? --      Excl. in GC? --     Vital signs reviewed, nursing assessments reviewed.   Constitutional:   Alert and oriented. Uncomfortable but not in distress. Eyes:   No scleral icterus. No conjunctival pallor. PERRL. EOMI.  No nystagmus. ENT   Head:   Normocephalic and atraumatic.   Nose:   No congestion/rhinnorhea. No septal hematoma   Mouth/Throat:   Mucous membranes, no pharyngeal erythema. No peritonsillar mass.    Neck:   No  stridor. No SubQ emphysema. No meningismus. Hematological/Lymphatic/Immunilogical:   No cervical lymphadenopathy. Cardiovascular:   Tachycardia heart rate 110. Symmetric bilateral radial and DP pulses.  No murmurs.  Respiratory:   Normal respiratory effort without tachypnea nor retractions. Breath sounds are clear and equal bilaterally. No wheezes/rales/rhonchi. Gastrointestinal:   Soft and nontender. Non distended. There is no CVA tenderness.  No rebound, rigidity, or guarding. Genitourinary:   deferred Musculoskeletal:   Nontender with normal range of motion in all extremities. No joint effusions.  No lower extremity tenderness.  No edema. Chest wall nontender Neurologic:   Normal speech and language.  CN 2-10 normal. Motor grossly intact. No gross focal neurologic deficits are appreciated.  Skin:    Skin is warm, dry and intact. No rash noted.  No petechiae, purpura, or bullae.  ____________________________________________    LABS (pertinent positives/negatives) (all labs ordered are listed, but only abnormal results are displayed) Labs Reviewed  BASIC METABOLIC PANEL - Abnormal; Notable for the following:    Sodium 134 (*)    Chloride 100 (*)    CO2 21 (*)    BUN 21 (*)    Creatinine, Ser 2.93 (*)    Calcium 11.3 (*)    GFR calc non Af Amer 23 (*)    GFR calc Af Amer 27 (*)    All other components within normal limits  CBC WITH DIFFERENTIAL/PLATELET - Abnormal; Notable for the following:    HCT 52.5 (*)    RDW 14.6 (*)    All other components within normal limits  TROPONIN I   ____________________________________________   EKG  Interpreted by me Sinus tachycardia rate 112, normal axis and intervals. Normal QRS ST segments and T waves.  ____________________________________________    RADIOLOGY  Chest x-ray unremarkable  ____________________________________________   PROCEDURES   ____________________________________________   INITIAL IMPRESSION /  ASSESSMENT AND PLAN / ED COURSE  Pertinent labs & imaging results that were available during my care of the patient were reviewed by me and considered in my medical decision making (see chart for details).  Patient presents with precordial chest pain with his history of hypertension and hyperlipidemia CAD and MI with PCI's concerning for ischemia. After multiple nitroglycerin the chest pain resolved. With IV fluids tachycardia improved. He is found to have acute renal failure with a creatinine of 2.9, was normal in the past. Likely the chest pain was precipitated by severe dehydration resulting in acute renal failure.  Presentation is not consistent with ACS at this time. Low suspicion for PE  or dissection. Case discussed with hospitalist for admission for acute renal failure and further evaluation of chest pain.     ____________________________________________   FINAL CLINICAL IMPRESSION(S) / ED DIAGNOSES  Final diagnoses:  Precordial chest pain  Acute renal failure, unspecified acute renal failure type [facility])       Portions of this note were generated with dragon dictation software. Dictation errors may occur despite best attempts at proofreading.   Sharman Cheek, MD 12/15/15 [phone]

## 2015-12-16 LAB — CBC
HCT: 40.3 % (ref 40.0–52.0)
Hemoglobin: 14.1 g/dL (ref 13.0–18.0)
MCH: 33.1 pg (ref 26.0–34.0)
MCHC: 34.9 g/dL (ref 32.0–36.0)
MCV: 94.7 fL (ref 80.0–100.0)
PLATELETS: 168 10*3/uL (ref 150–440)
RBC: 4.26 MIL/uL — ABNORMAL LOW (ref 4.40–5.90)
RDW: 14.9 % — AB (ref 11.5–14.5)
WBC: 5.5 10*3/uL (ref 3.8–10.6)

## 2015-12-16 LAB — BASIC METABOLIC PANEL
Anion gap: 4 — ABNORMAL LOW (ref 5–15)
BUN: 25 mg/dL — AB (ref 6–20)
CALCIUM: 8.5 mg/dL — AB (ref 8.9–10.3)
CHLORIDE: 109 mmol/L (ref 101–111)
CO2: 25 mmol/L (ref 22–32)
CREATININE: 1.35 mg/dL — AB (ref 0.61–1.24)
GFR calc Af Amer: >60 mL/min (ref >60)
GFR, EST NON AFRICAN AMERICAN: 59 mL/min — AB (ref >60)
Glucose, Bld: 101 mg/dL — ABNORMAL HIGH (ref 65–99)
Potassium: 4 mmol/L (ref 3.5–5.1)
SODIUM: 138 mmol/L (ref 135–145)

## 2015-12-16 LAB — TROPONIN I: Troponin I: <0.03 ng/mL (ref <0.03)

## 2015-12-16 NOTE — Discharge Summary (Signed)
[employer] [facility] [place] [facility]   PATIENT NAME: Bryce [name]    MR#:  [id number]  DATE OF BIRTH:  [birth year]  DATE OF ADMISSION:  12/15/2015 ADMITTING PHYSICIAN: Wyatt Haste, MD  DATE OF DISCHARGE: 12/16/2015  PRIMARY CARE PHYSICIAN: Pcp Not In System    ADMISSION DIAGNOSIS:  Precordial chest pain [R07.2] Acute renal failure, unspecified acute renal failure type (HCC) [N17.9]  DISCHARGE DIAGNOSIS:  Principal Problem:   Chest pain Active Problems:   AKI (acute kidney injury) (HCC)   SECONDARY DIAGNOSIS:   Past Medical History  Diagnosis Date  . MI (myocardial infarction) (HCC)   . Hypertension   . Coronary artery disease   . Ischemic cardiomyopathy   . Chronic systolic congestive heart failure [facility])     HOSPITAL COURSE:  Bryce [name]  is a 50 y.o. male admitted 12/15/2015 with chief complaint Chest Pain . Please see H&P performed by Wyatt Haste, MD for further information. 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 He was noted to have AKI based on blood work and symptoms. Placed on telemetry and and cardiac enzymes trended. He remained chest pain free the duration of hospitalization, no events on telemetry, enzymes normal. He received IV fluid hydration with normalization of renal function.   DISCHARGE CONDITIONS:   stable  CONSULTS OBTAINED:  Treatment Team:  Wyatt Haste, MD Lamar Blinks, MD  DRUG ALLERGIES:  No Known Allergies  DISCHARGE MEDICATIONS:   Current Discharge Medication List    CONTINUE these medications which have NOT CHANGED   Details  aspirin EC 81 MG tablet Take 81 mg by mouth daily.    metoprolol tartrate (LOPRESSOR) 25 MG tablet Take 0.5 tablets (12.5 mg total) by mouth 2 (two) times daily. Qty: 60 tablet, Refills: 0    pravastatin (PRAVACHOL) 40 MG tablet Take 1 tablet (40 mg total) by mouth daily. Qty: 30 tablet, Refills: 0         DISCHARGE  INSTRUCTIONS:    DIET:  Cardiac diet  DISCHARGE CONDITION:  Stable  ACTIVITY:  Activity as tolerated  OXYGEN:  Home Oxygen: No.   Oxygen Delivery: room air  DISCHARGE LOCATION:  home   If you experience worsening of your admission symptoms, develop shortness of breath, life threatening emergency, suicidal or homicidal thoughts you must seek medical attention immediately by calling 911 or calling your MD immediately  if symptoms less severe.  You Must read complete instructions/literature along with all the possible adverse reactions/side effects for all the Medicines you take and that have been prescribed to you. Take any new Medicines after you have completely understood and accpet all the possible adverse reactions/side effects.   Please note  You were cared for by a hospitalist during your hospital stay. If you have any questions about your discharge medications or the care you received while you were in the hospital after you are discharged, you can call the unit and asked to speak with the hospitalist on call if the hospitalist that took care of you is not available. Once you are discharged, your primary care physician will handle any further medical issues. Please note that NO REFILLS for any discharge medications will be authorized once you are discharged, as it is imperative that you return to your primary care physician (or establish a relationship with a primary care physician if you do not have one) for your aftercare needs so that they can reassess your need  for medications and monitor your lab values.    On the day of Discharge:   VITAL SIGNS:  Blood pressure 114/80, pulse 69, temperature 97.9 F (36.6 C), temperature source Oral, resp. rate 18, height 5\' 8"  (1.727 m), weight 228 lb 11.2 oz (103.738 kg), SpO2 98 %.  I/O:   Intake/Output Summary (Last 24 hours) at 12/16/15 0938 Last data filed at 12/16/15 0419  Gross per 24 hour  Intake      0 ml  Output    375  ml  Net   -375 ml    PHYSICAL EXAMINATION:  GENERAL:  50 y.o.-year-old patient lying in the bed with no acute distress.  EYES: Pupils equal, round, reactive to light and accommodation. No scleral icterus. Extraocular muscles intact.  HEENT: Head atraumatic, normocephalic. Oropharynx and nasopharynx clear.  NECK:  Supple, no jugular venous distention. No thyroid enlargement, no tenderness.  LUNGS: Normal breath sounds bilaterally, no wheezing, rales,rhonchi or crepitation. No use of accessory muscles of respiration.  CARDIOVASCULAR: S1, S2 normal. No murmurs, rubs, or gallops.  ABDOMEN: Soft, non-tender, non-distended. Bowel sounds present. No organomegaly or mass.  EXTREMITIES: No pedal edema, cyanosis, or clubbing.  NEUROLOGIC: Cranial nerves II through XII are intact. Muscle strength 5/5 in all extremities. Sensation intact. Gait not checked.  PSYCHIATRIC: The patient is alert and oriented x 3.  SKIN: No obvious rash, lesion, or ulcer.   DATA REVIEW:   CBC  Recent Labs Lab 12/16/15 0451  WBC 5.5  HGB 14.1  HCT 40.3  PLT 168    Chemistries   Recent Labs Lab 12/16/15 0451  NA 138  K 4.0  CL 109  CO2 25  GLUCOSE 101*  BUN 25*  CREATININE 1.35*  CALCIUM 8.5*    Cardiac Enzymes  Recent Labs Lab 12/16/15 0807  TROPONINI <0.03    Microbiology Results  No results found for this or any previous visit.  RADIOLOGY:  Dg Chest Portable 1 View  12/15/2015  CLINICAL DATA:  Chest pain radiating to back, history coronary artery disease post MI and coronary angioplasty, hypertension, ischemic cardiomyopathy, smoker EXAM: PORTABLE CHEST 1 VIEW COMPARISON:  Portable exam 1617 hours compared to 12/27/2014 FINDINGS: Upper normal heart size. Mediastinal contours and pulmonary vascularity normal. Lungs clear. No pleural effusion or pneumothorax. Bones unremarkable. IMPRESSION: No acute abnormalities. Electronically Signed   By: Ulyses SouthwardMark  Boles M.D.   On: 12/15/2015 16:31      Management plans discussed with the patient, family and they are in agreement.  CODE STATUS:     Code Status Orders        Start     Ordered   12/15/15 1851  Full code   Continuous     12/15/15 1851    Code Status History    Date Active Date Inactive Code Status Order ID Comments User Context   12/28/2014  1:13 PM 12/28/2014  7:17 PM Full Code [id number]  Alwyn Peawayne D Callwood, MD Inpatient   12/27/2014  1:47 PM 12/28/2014  1:13 PM Full Code [id number]  Milagros LollSrikar Sudini, MD ED   12/22/2014 10:39 AM 12/23/2014  6:57 PM Full Code [id number]  Gale Journeyatherine P Walsh, MD Inpatient      TOTAL TIME TAKING CARE OF THIS PATIENT: 28 minutes.    Raequon Catanzaro,  Mardi MainlandDavid K M.D on 12/16/2015 at 9:38 AM  Between 7am to 6pm - Pager - [phone]  After 6pm go to [email or web address] - Social research officer, [email or web address] [email or web address]  [employer] [place] [facility]  Office  [phone]  CC: Primary care physician; Pcp Not In System

## 2015-12-16 NOTE — Progress Notes (Signed)
Patient is being discharge home in a stable condition, denies pain at time of discharge, summary and f/u given , left with wife

## 2016-10-22 ENCOUNTER — Emergency Department
Admission: EM | Admit: 2016-10-22 | Discharge: 2016-10-22 | Disposition: A | Discharge status: Home / Self Care | Payer: Self-pay | Attending: Emergency Medicine | Admitting: Emergency Medicine

## 2016-10-22 ENCOUNTER — Emergency Department: Payer: Self-pay

## 2016-10-22 DIAGNOSIS — R079 Chest pain, unspecified: Secondary | ICD-10-CM

## 2016-10-22 DIAGNOSIS — I252 Old myocardial infarction: Secondary | ICD-10-CM | POA: Insufficient documentation

## 2016-10-22 DIAGNOSIS — I110 Hypertensive heart disease with heart failure: Secondary | ICD-10-CM | POA: Insufficient documentation

## 2016-10-22 DIAGNOSIS — R252 Cramp and spasm: Secondary | ICD-10-CM | POA: Insufficient documentation

## 2016-10-22 DIAGNOSIS — I5022 Chronic systolic (congestive) heart failure: Secondary | ICD-10-CM | POA: Insufficient documentation

## 2016-10-22 DIAGNOSIS — R0789 Other chest pain: Secondary | ICD-10-CM | POA: Insufficient documentation

## 2016-10-22 DIAGNOSIS — I2510 Atherosclerotic heart disease of native coronary artery without angina pectoris: Secondary | ICD-10-CM | POA: Insufficient documentation

## 2016-10-22 DIAGNOSIS — F17210 Nicotine dependence, cigarettes, uncomplicated: Secondary | ICD-10-CM | POA: Insufficient documentation

## 2016-10-22 LAB — CBC
HEMATOCRIT: 47.7 % (ref 40.0–52.0)
HEMOGLOBIN: 16.1 g/dL (ref 13.0–18.0)
MCH: 31.4 pg (ref 26.0–34.0)
MCHC: 33.7 g/dL (ref 32.0–36.0)
MCV: 93.3 fL (ref 80.0–100.0)
Platelets: 219 10*3/uL (ref 150–440)
RBC: 5.12 MIL/uL (ref 4.40–5.90)
RDW: 14.3 % (ref 11.5–14.5)
WBC: 6.6 10*3/uL (ref 3.8–10.6)

## 2016-10-22 LAB — BASIC METABOLIC PANEL
ANION GAP: 12 (ref 5–15)
BUN: 29 mg/dL — AB (ref 6–20)
CHLORIDE: 103 mmol/L (ref 101–111)
CO2: 21 mmol/L — ABNORMAL LOW (ref 22–32)
Calcium: 9.8 mg/dL (ref 8.9–10.3)
Creatinine, Ser: 1.31 mg/dL — ABNORMAL HIGH (ref 0.61–1.24)
GFR calc Af Amer: >60 mL/min (ref >60)
GLUCOSE: 162 mg/dL — AB (ref 65–99)
POTASSIUM: 3.7 mmol/L (ref 3.5–5.1)
Sodium: 136 mmol/L (ref 135–145)

## 2016-10-22 LAB — TROPONIN I: Troponin I: <0.03 ng/mL (ref <0.03)

## 2016-10-22 LAB — CK: CK TOTAL: 283 U/L (ref 49–397)

## 2016-10-22 MED ORDER — SODIUM CHLORIDE 0.9 % IV SOLN
Freq: Once | INTRAVENOUS | Status: AC
  Administered 2016-10-22: 14:00:00 via INTRAVENOUS

## 2016-10-22 MED ORDER — DIAZEPAM 5 MG PO TABS: 5.0000 mg | ORAL_TABLET | Freq: Three times a day (TID) | ORAL | 0 refills | Status: DC | PRN

## 2016-10-22 MED ORDER — DIAZEPAM 5 MG PO TABS
5.0000 mg | ORAL_TABLET | Freq: Once | ORAL | Status: AC
  Administered 2016-10-22: 5 mg via ORAL
  Filled 2016-10-22: qty 1

## 2016-10-22 MED ORDER — IBUPROFEN 600 MG PO TABS: 600.0000 mg | ORAL_TABLET | Freq: Three times a day (TID) | ORAL | 0 refills | Status: DC | PRN

## 2016-10-22 MED ORDER — KETOROLAC TROMETHAMINE 30 MG/ML IJ SOLN
15.0000 mg | Freq: Once | INTRAMUSCULAR | Status: AC
  Administered 2016-10-22: 15 mg via INTRAVENOUS
  Filled 2016-10-22: qty 1

## 2016-10-22 NOTE — ED Notes (Signed)
Pt reports that he got hot yesterday and had cramps last night but this am he started with chest pain - pt has history of 3 MI's and stents so he was concerned that he might be having an MI - he states it is not like it was when he had his heart attack - continues to c/o cramping all over - Dr Mayford KnifeWilliams is in room at this time assessing pt

## 2016-10-22 NOTE — ED Triage Notes (Signed)
CP that began at 3AM to left side of chest, described as a cramping sensation. Pt took nitro and pain was relieved for appprox 20 minutes then came back. Pt has been working outside and reports body cramping as well.

## 2016-10-22 NOTE — ED Provider Notes (Signed)
[facility] Emergency Department Provider Note       Time seen: ----------------------------------------- 1:57 PM on 10/22/2016 -----------------------------------------     I have reviewed the triage vital signs and the nursing notes.   HISTORY   Chief Complaint Chest Pain    HPI Bryce [name] is a 51 y.o. male who presents to the ED for chest pain that began about 3 AM to the left side of the chest. He described as a cramping sensation. He took nitroglycerin and the pain was relieved for approximately 20 minutes but then came back. His been working outside and reports body cramping as well. He denies fevers, chills or other complaints. Patient thinks he got dehydrated yesterday at work and he is cramping all over. He reports he has not had radiation of the pain into his neck like with his prior heart attack.   Past Medical History:  Diagnosis Date  . Chronic systolic congestive heart failure (HCC)   . Coronary artery disease   . Hypertension   . Ischemic cardiomyopathy   . MI (myocardial infarction) [facility])     Patient Active Problem List   Diagnosis Date Noted  . AKI (acute kidney injury) (HCC) 12/15/2015  . Solitary pulmonary nodule 12/23/2014  . Chest pain 12/22/2014  . Essential hypertension 12/22/2014  . Hyperlipidemia 12/22/2014    Past Surgical History:  Procedure Laterality Date  . CARDIAC CATHETERIZATION N/A 12/28/2014   Procedure: Left Heart Cath and Coronary Angiography;  Surgeon: Alwyn Pea, MD;  Location: [facility] INVASIVE CV LAB;  Service: Cardiovascular;  Laterality: N/A;  . CORONARY ANGIOPLASTY     Non-ST elevation MI status post stenting within bare-metal stent of the 75% lesion of the LAD, 11/13/2010.     Allergies Patient has no known allergies.  Social History Social History  Substance Use Topics  . Smoking status: Current Some Day Smoker    Packs/day: 0.25  . Smokeless tobacco: Not on file  . Alcohol use No     Review of Systems Constitutional: Negative for fever. Eyes: Negative for vision changes ENT:  Negative for congestion, sore throat Cardiovascular: Positive for chest pain Respiratory: Negative for shortness of breath. Gastrointestinal: Negative for abdominal pain, vomiting and diarrhea. Genitourinary: Negative for dysuria. Musculoskeletal: Positive for muscle cramps Skin: Negative for rash. Neurological: Negative for headaches, focal weakness or numbness.  All systems negative/normal/unremarkable except as stated in the HPI  ____________________________________________   PHYSICAL EXAM:  VITAL SIGNS: ED Triage Vitals [10/22/16 1207]  Enc Vitals Group     BP (!) 158/104     Pulse Rate (!) 101     Resp 18     Temp 98.6 F (37 C)     Temp Source Oral     SpO2 97 %     Weight 235 lb (106.6 kg)     Height 5\' 8"  (1.727 m)     Head Circumference      Peak Flow      Pain Score 7     Pain Loc      Pain Edu?      Excl. in GC?    Constitutional: Alert and oriented. Well appearing and in no distress. Eyes: Conjunctivae are normal. Normal extraocular movements. ENT   Head: Normocephalic and atraumatic.   Nose: No congestion/rhinnorhea.   Mouth/Throat: Mucous membranes are moist.   Neck: No stridor. Cardiovascular: Normal rate, regular rhythm. No murmurs, rubs, or gallops. Respiratory: Normal respiratory effort without tachypnea nor retractions. Breath sounds  are clear and equal bilaterally. No wheezes/rales/rhonchi. Gastrointestinal: Soft and nontender. Normal bowel sounds Musculoskeletal: Nontender with normal range of motion in extremities. No lower extremity tenderness nor edema. Neurologic:  Normal speech and language. No gross focal neurologic deficits are appreciated.  Skin:  Skin is warm, dry and intact. No rash noted. Psychiatric: Mood and affect are normal. Speech and behavior are normal.  ____________________________________________  EKG:  Interpreted by me.Sinus rhythm rate of 99 bpm, normal PR interval, normal QRS, normal QT.  ____________________________________________  ED COURSE:  Pertinent labs & imaging results that were available during my care of the patient were reviewed by me and considered in my medical decision making (see chart for details). Patient presents for Chest pain, we will assess with labs and imaging as indicated.   Procedures ____________________________________________   LABS (pertinent positives/negatives)  Labs Reviewed  BASIC METABOLIC PANEL - Abnormal; Notable for the following:       Result Value   CO2 21 (*)    Glucose, Bld 162 (*)    BUN 29 (*)    Creatinine, Ser 1.31 (*)    All other components within normal limits  CBC  TROPONIN I  TROPONIN I  CK    RADIOLOGY  Chest x-ray IMPRESSION: 1. No acute cardiopulmonary abnormalities. ____________________________________________  FINAL ASSESSMENT AND PLAN  Chest pain, muscle cramps  Plan: Patient's labs and imaging were dictated above. Patient had presented for chest pain and diffuse body cramping. Repeat troponin here was negative as well as a CK level. Currently symptoms have improved, he'll be referred to cardiology for outpatient follow-up.   Emily FilbertWilliams, Bryce Stalling E, MD   Note: This note was generated in part or whole with voice recognition software. Voice recognition is usually quite accurate but there are transcription errors that can and very often do occur. I apologize for any typographical errors that were not detected and corrected.     Emily FilbertWilliams, Bryce Wyss E, MD 10/22/16 1450

## 2016-12-01 ENCOUNTER — Emergency Department
Admission: EM | Admit: 2016-12-01 | Discharge: 2016-12-01 | Disposition: A | Discharge status: Home / Self Care | Payer: Self-pay | Attending: Emergency Medicine | Admitting: Emergency Medicine

## 2016-12-01 DIAGNOSIS — Z7982 Long term (current) use of aspirin: Secondary | ICD-10-CM | POA: Insufficient documentation

## 2016-12-01 DIAGNOSIS — Z79899 Other long term (current) drug therapy: Secondary | ICD-10-CM | POA: Insufficient documentation

## 2016-12-01 DIAGNOSIS — I5023 Acute on chronic systolic (congestive) heart failure: Secondary | ICD-10-CM | POA: Insufficient documentation

## 2016-12-01 DIAGNOSIS — Z955 Presence of coronary angioplasty implant and graft: Secondary | ICD-10-CM | POA: Insufficient documentation

## 2016-12-01 DIAGNOSIS — M5441 Lumbago with sciatica, right side: Secondary | ICD-10-CM | POA: Insufficient documentation

## 2016-12-01 DIAGNOSIS — I110 Hypertensive heart disease with heart failure: Secondary | ICD-10-CM | POA: Insufficient documentation

## 2016-12-01 DIAGNOSIS — I5022 Chronic systolic (congestive) heart failure: Secondary | ICD-10-CM | POA: Insufficient documentation

## 2016-12-01 DIAGNOSIS — I252 Old myocardial infarction: Secondary | ICD-10-CM | POA: Insufficient documentation

## 2016-12-01 DIAGNOSIS — F17200 Nicotine dependence, unspecified, uncomplicated: Secondary | ICD-10-CM | POA: Insufficient documentation

## 2016-12-01 MED ORDER — TRAMADOL HCL 50 MG PO TABS: 50.0000 mg | ORAL_TABLET | Freq: Four times a day (QID) | ORAL | 0 refills | Status: DC | PRN

## 2016-12-01 MED ORDER — ORPHENADRINE CITRATE 30 MG/ML IJ SOLN
60.0000 mg | Freq: Two times a day (BID) | INTRAMUSCULAR | Status: DC
  Administered 2016-12-01: 60 mg via INTRAMUSCULAR
  Filled 2016-12-01: qty 2

## 2016-12-01 MED ORDER — PREDNISONE 10 MG (21) PO TBPK: ORAL_TABLET | ORAL | 0 refills | Status: DC

## 2016-12-01 MED ORDER — DEXAMETHASONE SODIUM PHOSPHATE 10 MG/ML IJ SOLN
10.0000 mg | Freq: Once | INTRAMUSCULAR | Status: AC
  Administered 2016-12-01: 10 mg via INTRAMUSCULAR
  Filled 2016-12-01: qty 1

## 2016-12-01 MED ORDER — CYCLOBENZAPRINE HCL 10 MG PO TABS: 10.0000 mg | ORAL_TABLET | Freq: Three times a day (TID) | ORAL | 0 refills | Status: DC | PRN

## 2016-12-01 NOTE — ED Triage Notes (Signed)
Patient tripped into a small hole in the yard. Patient stated "i felt a pop"  Patient pinpoints the pain to the area of the right piriformis muscle. Patient states that his right lateral leg is "like pins and needles"  Patient unable to cross his right leg over his left.

## 2016-12-01 NOTE — ED Provider Notes (Signed)
[facility] Emergency Department Provider Note   ____________________________________________   I have reviewed the triage vital signs and the nursing notes.   HISTORY  Chief Complaint Leg Pain    HPI Bryce [name] is a 52 y.o. male presents with lumbar back pain with radiating radicular pain to his right foot. Patient reports walking and stepped in a hole "jarring" his back 3 days ago. Patient describes right lower extremity pain and tingling sensation to the foot. He denies bowel/bladder dysfunction or saddle anesthesia.  Patient self treated with ibuprofen, rest and Tylenol without relief. 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   Patient denies fever, chills, headache, vision changes, chest pain, chest tightness, shortness of breath, abdominal pain, nausea and vomiting.  Past Medical History:  Diagnosis Date  . Chronic systolic congestive heart failure (HCC)   . Coronary artery disease   . Hypertension   . Ischemic cardiomyopathy   . MI (myocardial infarction) [facility])     Patient Active Problem List   Diagnosis Date Noted  . AKI (acute kidney injury) (HCC) 12/15/2015  . Solitary pulmonary nodule 12/23/2014  . Chest pain 12/22/2014  . Essential hypertension 12/22/2014  . Hyperlipidemia 12/22/2014    Past Surgical History:  Procedure Laterality Date  . CARDIAC CATHETERIZATION N/A 12/28/2014   Procedure: Left Heart Cath and Coronary Angiography;  Surgeon: Alwyn Pea, MD;  Location: [facility] INVASIVE CV LAB;  Service: Cardiovascular;  Laterality: N/A;  . CORONARY ANGIOPLASTY     Non-ST elevation MI status post stenting within bare-metal stent of the 75% lesion of the LAD, 11/13/2010.     Prior to Admission medications   Medication Sig Start Date End Date Taking? Authorizing Provider  aspirin EC 81 MG tablet Take 81 mg by mouth daily.     [provider]  cyclobenzaprine (FLEXERIL) 10 MG tablet Take 1 tablet (10 mg total) by mouth 3 (three) times daily as needed for muscle spasms. 12/01/16   Eriel Doyon M, PA-C  diazepam (VALIUM) 5 MG tablet Take 1 tablet (5 mg total) by mouth every 8 (eight) hours as needed for muscle spasms. 10/22/16   Emily Filbert, MD  ibuprofen (ADVIL,MOTRIN) 600 MG tablet Take 1 tablet (600 mg total) by mouth every 8 (eight) hours as needed. 10/22/16   Emily Filbert, MD  metoprolol tartrate (LOPRESSOR) 25 MG tablet Take 0.5 tablets (12.5 mg total) by mouth 2 (two) times daily. Patient not taking: Reported on 10/22/2016 12/23/14   Gale Journey, MD  pravastatin (PRAVACHOL) 40 MG tablet Take 1 tablet (40 mg total) by mouth daily. Patient not taking: Reported on 10/22/2016 12/23/14   Gale Journey, MD  predniSONE (STERAPRED UNI-PAK 21 TAB) 10 MG (21) TBPK tablet Take 6 tablets on day 1. Take 5 tablets on day 2. Take 4 tablets on day 3. Take 3 tablets on day 4. Take 2 tablets on day 5. Take 1 tablets on day 6. 12/01/16   Makina Skow M, PA-C  traMADol (ULTRAM) 50 MG tablet Take 1 tablet (50 mg total) by mouth every 6 (six) hours as needed. 12/01/16 12/01/17  Keisi Eckford, Jordan Likes, PA-C    Allergies Patient has no known allergies.  Family History  Problem Relation Age of Onset  . CAD Father     Social History Social History  Substance Use Topics  . Smoking status: Current Some Day Smoker  Packs/day: 0.25  . Smokeless tobacco: Not on file  . Alcohol use No    Review of Systems Constitutional: Negative for fever/chills Eyes: No visual changes. Cardiovascular: Denies chest pain. Gastrointestinal: No abdominal pain. . Musculoskeletal:Positive for lumbar back pain radiating down the right lower extremity. Skin: Negative for rash. Neurological: Negative for headaches.  Negative focal weakness or numbness. Able to  ambulate. ____________________________________________ ____________________________________________   PHYSICAL EXAM:  VITAL SIGNS: ED Triage Vitals  Enc Vitals Group     BP 12/01/16 1711 (!) 148/92     Pulse Rate 12/01/16 1711 88     Resp 12/01/16 1711 18     Temp 12/01/16 1711 98.7 F (37.1 C)     Temp Source 12/01/16 1711 Oral     SpO2 12/01/16 1711 96 %     Weight 12/01/16 1703 240 lb (108.9 kg)     Height 12/01/16 1703 5\' 8"  (1.727 m)     Head Circumference --      Peak Flow --      Pain Score 12/01/16 1702 9     Pain Loc --      Pain Edu? --      Excl. in GC? --     Constitutional: Alert and oriented. Well appearing and in no acute distress.  Head: Normocephalic and atraumatic. Cardiovascular: Normal rate, regular rhythm. Normal distal pulses. Respiratory: Normal respiratory effort.  Gastrointestinal: Soft and nontender.  Musculoskeletal: Lumbar back pain with intact range of motion all planes. Negative spinous process and transverse process tenderness. Palpable tenderness along the right paraspinals and gluteal musculature. Radicular pain radiating down the right lower extremity to the foot described as aching with tingling to the right foot. Otherwise, nontender with normal range of motion in all extremities. Negative bowel or bladder dysfunction or saddle anesthesia. Neurologic: Normal speech and language. No gross focal neurologic deficits are appreciated. No gait instability. No sensory loss or abnormal reflexes.  Skin:  Skin is warm, dry and intact. No rash noted. Psychiatric: Mood and affect are normal. ____________________________________________   LABS (all labs ordered are listed, but only abnormal results are displayed)  Labs Reviewed - No data to display ____________________________________________  EKG none ____________________________________________  RADIOLOGY none ____________________________________________   PROCEDURES  Procedure(s)  performed: no    Critical Care performed: no ____________________________________________   INITIAL IMPRESSION / ASSESSMENT AND PLAN / ED COURSE  Pertinent labs & imaging results that were available during my care of the patient were reviewed by me and considered in my medical decision making (see chart for details).   Patient presented with lumbar back pain with right side radicular lower extremity pain. Patient physical exam findings and history are reassuring symptoms are consistent with nerve root irritation and inflammation.. Patient patient noted reduction of symptoms following Decadron 10 mg IM and Norflex 60 mg IM  given during course of care in the emergency department. Patient will be given a prednisone taper, Flexeril  and tramadol for continued pain and symptoms management. Patient is encouraged encouraged to transition to NSAIDS once the taper is completed and symptoms improve. Patient advised to follow up with  neurosurgery spine specialist as needed and was also advised to return to the emergency department for symptoms that change or worsen. Patient informed of clinical course, understand medical decision-making process, and agree with plan.       If controlled substance prescribed during this visit, 12 month history viewed on the [facility] prior to issuing an initial prescription for Schedule II  or III opiod. ____________________________________________   FINAL CLINICAL IMPRESSION(S) / ED DIAGNOSES  Final diagnoses:  Acute back pain with sciatica, right       NEW MEDICATIONS STARTED DURING THIS VISIT:  New Prescriptions   CYCLOBENZAPRINE (FLEXERIL) 10 MG TABLET    Take 1 tablet (10 mg total) by mouth 3 (three) times daily as needed for muscle spasms.   PREDNISONE (STERAPRED UNI-PAK 21 TAB) 10 MG (21) TBPK TABLET    Take 6 tablets on day 1. Take 5 tablets on day 2. Take 4 tablets on day 3. Take 3 tablets on day 4. Take 2 tablets on day 5. Take 1 tablets on day 6.    TRAMADOL (ULTRAM) 50 MG TABLET    Take 1 tablet (50 mg total) by mouth every 6 (six) hours as needed.     Note:  This document was prepared using Dragon voice recognition software and may include unintentional dictation errors.    Percell Boston 12/01/16 Ann Maki, MD 12/01/16 [phone]

## 2016-12-16 ENCOUNTER — Emergency Department
Admission: EM | Admit: 2016-12-16 | Discharge: 2016-12-16 | Disposition: A | Discharge status: Home / Self Care | Payer: Self-pay | Attending: Emergency Medicine | Admitting: Emergency Medicine

## 2016-12-16 ENCOUNTER — Encounter: Payer: Self-pay | Admitting: Emergency Medicine

## 2016-12-16 ENCOUNTER — Emergency Department: Payer: Self-pay

## 2016-12-16 DIAGNOSIS — I2510 Atherosclerotic heart disease of native coronary artery without angina pectoris: Secondary | ICD-10-CM | POA: Insufficient documentation

## 2016-12-16 DIAGNOSIS — I110 Hypertensive heart disease with heart failure: Secondary | ICD-10-CM | POA: Insufficient documentation

## 2016-12-16 DIAGNOSIS — M5431 Sciatica, right side: Secondary | ICD-10-CM | POA: Insufficient documentation

## 2016-12-16 DIAGNOSIS — F17210 Nicotine dependence, cigarettes, uncomplicated: Secondary | ICD-10-CM | POA: Insufficient documentation

## 2016-12-16 DIAGNOSIS — Z955 Presence of coronary angioplasty implant and graft: Secondary | ICD-10-CM | POA: Insufficient documentation

## 2016-12-16 DIAGNOSIS — Z7982 Long term (current) use of aspirin: Secondary | ICD-10-CM | POA: Insufficient documentation

## 2016-12-16 DIAGNOSIS — Z79899 Other long term (current) drug therapy: Secondary | ICD-10-CM | POA: Insufficient documentation

## 2016-12-16 DIAGNOSIS — R0789 Other chest pain: Secondary | ICD-10-CM | POA: Insufficient documentation

## 2016-12-16 DIAGNOSIS — I252 Old myocardial infarction: Secondary | ICD-10-CM | POA: Insufficient documentation

## 2016-12-16 DIAGNOSIS — M791 Myalgia: Secondary | ICD-10-CM | POA: Insufficient documentation

## 2016-12-16 DIAGNOSIS — M5441 Lumbago with sciatica, right side: Secondary | ICD-10-CM

## 2016-12-16 DIAGNOSIS — I5022 Chronic systolic (congestive) heart failure: Secondary | ICD-10-CM | POA: Insufficient documentation

## 2016-12-16 DIAGNOSIS — M7918 Myalgia, other site: Secondary | ICD-10-CM

## 2016-12-16 LAB — BASIC METABOLIC PANEL
Anion gap: 7 (ref 5–15)
BUN: 19 mg/dL (ref 6–20)
CHLORIDE: 108 mmol/L (ref 101–111)
CO2: 24 mmol/L (ref 22–32)
Calcium: 8.9 mg/dL (ref 8.9–10.3)
Creatinine, Ser: 1.02 mg/dL (ref 0.61–1.24)
GFR calc Af Amer: >60 mL/min (ref >60)
GFR calc non Af Amer: >60 mL/min (ref >60)
GLUCOSE: 112 mg/dL — AB (ref 65–99)
POTASSIUM: 4 mmol/L (ref 3.5–5.1)
Sodium: 139 mmol/L (ref 135–145)

## 2016-12-16 LAB — CBC
HEMATOCRIT: 41.4 % (ref 40.0–52.0)
Hemoglobin: 14.2 g/dL (ref 13.0–18.0)
MCH: 32.7 pg (ref 26.0–34.0)
MCHC: 34.3 g/dL (ref 32.0–36.0)
MCV: 95.4 fL (ref 80.0–100.0)
Platelets: 158 10*3/uL (ref 150–440)
RBC: 4.34 MIL/uL — ABNORMAL LOW (ref 4.40–5.90)
RDW: 14.5 % (ref 11.5–14.5)
WBC: 5.3 10*3/uL (ref 3.8–10.6)

## 2016-12-16 LAB — TROPONIN I: Troponin I: <0.03 ng/mL (ref <0.03)

## 2016-12-16 MED ORDER — CYCLOBENZAPRINE HCL 10 MG PO TABS
5.0000 mg | ORAL_TABLET | Freq: Once | ORAL | Status: AC
  Administered 2016-12-16: 5 mg via ORAL
  Filled 2016-12-16: qty 1

## 2016-12-16 MED ORDER — CYCLOBENZAPRINE HCL 10 MG PO TABS: 10.0000 mg | ORAL_TABLET | Freq: Three times a day (TID) | ORAL | 0 refills | Status: DC | PRN

## 2016-12-16 MED ORDER — HYDROCODONE-ACETAMINOPHEN 5-325 MG PO TABS: 1.0000 | ORAL_TABLET | ORAL | 0 refills | Status: DC | PRN

## 2016-12-16 MED ORDER — HYDROCODONE-ACETAMINOPHEN 5-325 MG PO TABS
2.0000 | ORAL_TABLET | Freq: Once | ORAL | Status: AC
  Administered 2016-12-16: 2 via ORAL
  Filled 2016-12-16: qty 2

## 2016-12-16 MED ORDER — PREDNISONE 10 MG PO TABS: 10.0000 mg | ORAL_TABLET | Freq: Every day | ORAL | 0 refills | Status: DC

## 2016-12-16 NOTE — ED Triage Notes (Signed)
Pt c/o pain to right side of chest that started yesterday evening; sharp pain; reports short of breath; pt also c/o sciatica flare up for 10 days; rates both 10/10; pt unable to sit still in wheelchair;

## 2016-12-16 NOTE — ED Provider Notes (Signed)
[facility] Emergency Department Provider Note  Time seen: 7:26 AM  I have reviewed the triage vital signs and the nursing notes.   HISTORY  Chief Complaint Chest Pain and Sciatica    HPI Bryce [name] is a 51 y.o. male with a past medical history of MI, CHF, hypertension, back pain, who presents to the emergency department for worsening back pain and now with right chest pain. According to the patient for the past one week he has had significant lower back pain with radiation into his right leg. Patient states he has had this before but it is worse this time. He went and saw [name], but they would not see him as he does not have insurance so they sent [name]. He states he went [name] was diagnosed with sciatica given 5 days of steroids. He states some relief with steroids, but continues to have severe pain and is not able to sleep due to the pain. States some tingling in the right leg which has been ongoing for the past one week. Denies any weakness. Denies any incontinence. Denies any fever. He states he was told to try not to use his lower back but he does manual labor for a living. He thinks he has been compensating by using his upper body a lot more and states since yesterday he is having some pain in the right chest worse with movement or if he pushes on the area. States a history of myocardial infarction in the past which this feels completely dissimilar. Denies any shortness of breath, nausea or diaphoresis.  Past Medical History:  Diagnosis Date  . Chronic systolic congestive heart failure (HCC)   . Coronary artery disease   . Hypertension   . Ischemic cardiomyopathy   . MI (myocardial infarction) [facility])     Patient Active Problem List   Diagnosis Date Noted  . AKI (acute kidney injury) (HCC) 12/15/2015  . Solitary pulmonary nodule 12/23/2014  . Chest pain 12/22/2014  . Essential hypertension 12/22/2014  . Hyperlipidemia 12/22/2014    Past  Surgical History:  Procedure Laterality Date  . CARDIAC CATHETERIZATION N/A 12/28/2014   Procedure: Left Heart Cath and Coronary Angiography;  Surgeon: Alwyn Pea, MD;  Location: [facility] INVASIVE CV LAB;  Service: Cardiovascular;  Laterality: N/A;  . CORONARY ANGIOPLASTY     Non-ST elevation MI status post stenting within bare-metal stent of the 75% lesion of the LAD, 11/13/2010.     Prior to Admission medications   Medication Sig Start Date End Date Taking? Authorizing Provider  aspirin EC 81 MG tablet Take 81 mg by mouth daily.    [provider]  cyclobenzaprine (FLEXERIL) 10 MG tablet Take 1 tablet (10 mg total) by mouth 3 (three) times daily as needed for muscle spasms. 12/01/16   Little, Traci M, PA-C  diazepam (VALIUM) 5 MG tablet Take 1 tablet (5 mg total) by mouth every 8 (eight) hours as needed for muscle spasms. 10/22/16   Emily Filbert, MD  ibuprofen (ADVIL,MOTRIN) 600 MG tablet Take 1 tablet (600 mg total) by mouth every 8 (eight) hours as needed. 10/22/16   Emily Filbert, MD  metoprolol tartrate (LOPRESSOR) 25 MG tablet Take 0.5 tablets (12.5 mg total) by mouth 2 (two) times daily. Patient not taking: Reported on 10/22/2016 12/23/14   Gale Journey, MD  pravastatin (PRAVACHOL) 40 MG tablet Take 1 tablet (40 mg total) by mouth daily. Patient not taking: Reported on 10/22/2016 12/23/14   Clent Ridges,  Ena Dawleyatherine P, MD  predniSONE (STERAPRED UNI-PAK 21 TAB) 10 MG (21) TBPK tablet Take 6 tablets on day 1. Take 5 tablets on day 2. Take 4 tablets on day 3. Take 3 tablets on day 4. Take 2 tablets on day 5. Take 1 tablets on day 6. 12/01/16   Little, Traci M, PA-C  traMADol (ULTRAM) 50 MG tablet Take 1 tablet (50 mg total) by mouth every 6 (six) hours as needed. 12/01/16 12/01/17  Little, Traci M, PA-C    No Known Allergies  Family History  Problem Relation Age of Onset  . CAD Father     Social History Social History  Substance Use Topics  . Smoking status: Current Every  Day Smoker    Packs/day: 1.00    Types: Cigarettes  . Smokeless tobacco: Never Used  . Alcohol use 0.6 oz/week    1 Cans of beer per week     Comment: "every now and then"    Review of Systems Constitutional: Negative for fever. Cardiovascular: Right chest pain worse with movement or palpation. Respiratory: Negative for shortness of breath. Negative for cough. Gastrointestinal: Negative for abdominal pain, vomiting  Genitourinary: Negative for dysuria. Musculoskeletal: Lower back pain severe Skin: Negative for rash. Neurological: Negative for headache. Denies any focal weakness. All other ROS negative  ____________________________________________   PHYSICAL EXAM:  VITAL SIGNS: ED Triage Vitals  Enc Vitals Group     BP 12/16/16 0546 (!) 144/91     Pulse Rate 12/16/16 0546 75     Resp 12/16/16 0546 18     Temp 12/16/16 0546 98.3 F (36.8 C)     Temp Source 12/16/16 0546 Oral     SpO2 12/16/16 0546 95 %     Weight 12/16/16 0547 245 lb (111.1 kg)     Height 12/16/16 0547 5\' 8"  (1.727 m)     Head Circumference --      Peak Flow --      Pain Score 12/16/16 0552 10     Pain Loc --      Pain Edu? --      Excl. in GC? --     Constitutional: Alert and oriented. Well appearing and in no distress. Eyes: Normal exam ENT   Head: Normocephalic and atraumatic.   Mouth/Throat: Mucous membranes are moist. Cardiovascular: Normal rate, regular rhythm. No murmur Respiratory: Normal respiratory effort without tachypnea nor retractions. Breath sounds are clear  Gastrointestinal: Soft and nontender. No distention.   Musculoskeletal: Moderate lower back pain. Good range of motion bilateral lower extremities although with some lower back discomfort. Neurologic:  Normal speech and language. Sensation intact. Motor intact and equal. Patient does state tingling in the right lower extremity. Skin:  Skin is warm, dry and intact.  Psychiatric: Mood and affect are normal.    ____________________________________________    EKG  EKG reviewed and interpreted by myself shows normal sinus rhythm at 75 bpm, narrow QRS, normal axis, normal intervals, nonspecific ST changes without ST elevation.  ____________________________________________    RADIOLOGY  Chest x-ray negative, mild vascular congestion.  ____________________________________________   INITIAL IMPRESSION / ASSESSMENT AND PLAN / ED COURSE  Pertinent labs & imaging results that were available during my care of the patient were reviewed by me and considered in my medical decision making (see chart for details).  Patient presents to the emergency department for lower back pain times one week. Patient's lower back pain appears to be very musculoskeletal, possibly sciatica. We will prescribe a longer course  of steroid taper. We'll place on hydrocodone and Flexeril. Patient is following up with [facility]. Highly suspect musculoskeletal chest pain. Very reproducible on exam, worse with movement. Labs including cardiac enzymes are negative, EKG shows no acute findings.  ____________________________________________   FINAL CLINICAL IMPRESSION(S) / ED DIAGNOSES  Back pain Right chest pain Musculoskeletal pain   Minna Antis, MD 12/16/16 [phone]

## 2017-02-02 ENCOUNTER — Other Ambulatory Visit: Payer: Self-pay

## 2017-02-02 ENCOUNTER — Inpatient Hospital Stay
Admission: EM | Admit: 2017-02-02 | Discharge: 2017-02-04 | DRG: 247 | Disposition: A | Discharge status: Home / Self Care | Payer: Self-pay | Attending: Internal Medicine | Admitting: Internal Medicine

## 2017-02-02 ENCOUNTER — Emergency Department: Payer: Self-pay

## 2017-02-02 ENCOUNTER — Encounter: Payer: Self-pay | Admitting: Emergency Medicine

## 2017-02-02 ENCOUNTER — Encounter
Admission: EM | Disposition: A | Discharge status: Home / Self Care | Payer: Self-pay | Source: Home / Self Care | Attending: Internal Medicine

## 2017-02-02 DIAGNOSIS — Z6838 Body mass index (BMI) 38.0-38.9, adult: Secondary | ICD-10-CM

## 2017-02-02 DIAGNOSIS — Z955 Presence of coronary angioplasty implant and graft: Secondary | ICD-10-CM

## 2017-02-02 DIAGNOSIS — E785 Hyperlipidemia, unspecified: Secondary | ICD-10-CM | POA: Diagnosis present

## 2017-02-02 DIAGNOSIS — I2119 ST elevation (STEMI) myocardial infarction involving other coronary artery of inferior wall: Principal | ICD-10-CM | POA: Diagnosis present

## 2017-02-02 DIAGNOSIS — I255 Ischemic cardiomyopathy: Secondary | ICD-10-CM | POA: Diagnosis present

## 2017-02-02 DIAGNOSIS — I110 Hypertensive heart disease with heart failure: Secondary | ICD-10-CM | POA: Diagnosis present

## 2017-02-02 DIAGNOSIS — G4733 Obstructive sleep apnea (adult) (pediatric): Secondary | ICD-10-CM | POA: Diagnosis present

## 2017-02-02 DIAGNOSIS — I2510 Atherosclerotic heart disease of native coronary artery without angina pectoris: Secondary | ICD-10-CM | POA: Diagnosis present

## 2017-02-02 DIAGNOSIS — Z79899 Other long term (current) drug therapy: Secondary | ICD-10-CM

## 2017-02-02 DIAGNOSIS — Z136 Encounter for screening for cardiovascular disorders: Secondary | ICD-10-CM

## 2017-02-02 DIAGNOSIS — I213 ST elevation (STEMI) myocardial infarction of unspecified site: Secondary | ICD-10-CM | POA: Diagnosis present

## 2017-02-02 DIAGNOSIS — I5022 Chronic systolic (congestive) heart failure: Secondary | ICD-10-CM | POA: Diagnosis present

## 2017-02-02 DIAGNOSIS — F17210 Nicotine dependence, cigarettes, uncomplicated: Secondary | ICD-10-CM | POA: Diagnosis present

## 2017-02-02 HISTORY — PX: CORONARY STENT INTERVENTION: CATH118234

## 2017-02-02 HISTORY — PX: LEFT HEART CATH AND CORONARY ANGIOGRAPHY: CATH118249

## 2017-02-02 LAB — MRSA PCR SCREENING: MRSA BY PCR: NEGATIVE

## 2017-02-02 LAB — GLUCOSE, CAPILLARY: Glucose-Capillary: 122 mg/dL — ABNORMAL HIGH (ref 65–99)

## 2017-02-02 LAB — BASIC METABOLIC PANEL
Anion gap: 4 — ABNORMAL LOW (ref 5–15)
BUN: 14 mg/dL (ref 6–20)
CALCIUM: 9 mg/dL (ref 8.9–10.3)
CHLORIDE: 112 mmol/L — AB (ref 101–111)
CO2: 25 mmol/L (ref 22–32)
Creatinine, Ser: 0.99 mg/dL (ref 0.61–1.24)
Glucose, Bld: 114 mg/dL — ABNORMAL HIGH (ref 65–99)
Potassium: 4.4 mmol/L (ref 3.5–5.1)
SODIUM: 141 mmol/L (ref 135–145)

## 2017-02-02 LAB — TROPONIN I
TROPONIN I: 4.08 ng/mL — AB (ref <0.03)
Troponin I: 25.8 ng/mL (ref <0.03)

## 2017-02-02 LAB — POCT ACTIVATED CLOTTING TIME: Activated Clotting Time: 356 seconds

## 2017-02-02 LAB — CARDIAC CATHETERIZATION: CATHEFQUANT: 50 %

## 2017-02-02 LAB — CBC
HCT: 40.9 % (ref 40.0–52.0)
HEMOGLOBIN: 14.1 g/dL (ref 13.0–18.0)
MCH: 33.4 pg (ref 26.0–34.0)
MCHC: 34.5 g/dL (ref 32.0–36.0)
MCV: 96.7 fL (ref 80.0–100.0)
PLATELETS: 192 10*3/uL (ref 150–440)
RBC: 4.23 MIL/uL — ABNORMAL LOW (ref 4.40–5.90)
RDW: 14.8 % — ABNORMAL HIGH (ref 11.5–14.5)
WBC: 4.7 10*3/uL (ref 3.8–10.6)

## 2017-02-02 LAB — PROTIME-INR
INR: 0.98
PROTHROMBIN TIME: 12.9 s (ref 11.4–15.2)

## 2017-02-02 SURGERY — LEFT HEART CATH AND CORONARY ANGIOGRAPHY
Anesthesia: Moderate Sedation

## 2017-02-02 MED ORDER — FENTANYL CITRATE (PF) 100 MCG/2ML IJ SOLN
INTRAMUSCULAR | Status: DC | PRN
  Administered 2017-02-02 (×2): 25 ug via INTRAVENOUS

## 2017-02-02 MED ORDER — TICAGRELOR 90 MG PO TABS
ORAL_TABLET | ORAL | Status: AC
  Filled 2017-02-02: qty 2

## 2017-02-02 MED ORDER — HEPARIN (PORCINE) IN NACL 2-0.9 UNIT/ML-% IJ SOLN
INTRAMUSCULAR | Status: DC | PRN
  Administered 2017-02-02: 11:00:00

## 2017-02-02 MED ORDER — MORPHINE SULFATE (PF) 4 MG/ML IV SOLN
4.0000 mg | Freq: Once | INTRAVENOUS | Status: AC
  Administered 2017-02-02: 4 mg via INTRAVENOUS
  Filled 2017-02-02: qty 1

## 2017-02-02 MED ORDER — ACETAMINOPHEN 325 MG PO TABS
650.0000 mg | ORAL_TABLET | Freq: Once | ORAL | Status: AC
  Administered 2017-02-02: 650 mg via ORAL
  Filled 2017-02-02: qty 2

## 2017-02-02 MED ORDER — NITROGLYCERIN 0.4 MG SL SUBL
0.4000 mg | SUBLINGUAL_TABLET | Freq: Once | SUBLINGUAL | Status: AC
  Administered 2017-02-02: 0.4 mg via SUBLINGUAL
  Filled 2017-02-02: qty 1

## 2017-02-02 MED ORDER — CLOPIDOGREL BISULFATE 300 MG PO TABS
ORAL_TABLET | ORAL | Status: AC
  Filled 2017-02-02: qty 1

## 2017-02-02 MED ORDER — METOPROLOL TARTRATE 5 MG/5ML IV SOLN
5.0000 mg | Freq: Once | INTRAVENOUS | Status: AC
  Administered 2017-02-02: 5 mg via INTRAVENOUS

## 2017-02-02 MED ORDER — KETOROLAC TROMETHAMINE 15 MG/ML IJ SOLN
15.0000 mg | Freq: Four times a day (QID) | INTRAMUSCULAR | Status: AC | PRN
  Administered 2017-02-02: 15 mg via INTRAVENOUS
  Filled 2017-02-02: qty 1

## 2017-02-02 MED ORDER — ONDANSETRON HCL 4 MG/2ML IJ SOLN: 4.0000 mg | Freq: Four times a day (QID) | INTRAMUSCULAR | Status: DC | PRN

## 2017-02-02 MED ORDER — MORPHINE SULFATE (PF) 4 MG/ML IV SOLN: 4.0000 mg | INTRAVENOUS | Status: DC | PRN

## 2017-02-02 MED ORDER — ATORVASTATIN CALCIUM 20 MG PO TABS
80.0000 mg | ORAL_TABLET | Freq: Every day | ORAL | Status: DC
  Administered 2017-02-02 – 2017-02-03 (×2): 80 mg via ORAL
  Filled 2017-02-02 (×3): qty 4

## 2017-02-02 MED ORDER — SODIUM CHLORIDE 0.9 % IV SOLN
INTRAVENOUS | Status: DC
  Administered 2017-02-02 – 2017-02-03 (×2): via INTRAVENOUS

## 2017-02-02 MED ORDER — ZOLPIDEM TARTRATE 5 MG PO TABS: 5.0000 mg | ORAL_TABLET | Freq: Every evening | ORAL | Status: DC | PRN

## 2017-02-02 MED ORDER — ONDANSETRON HCL 4 MG/2ML IJ SOLN
4.0000 mg | Freq: Once | INTRAMUSCULAR | Status: AC
  Administered 2017-02-02: 4 mg via INTRAVENOUS
  Filled 2017-02-02: qty 2

## 2017-02-02 MED ORDER — IOPAMIDOL (ISOVUE-300) INJECTION 61%
INTRAVENOUS | Status: DC | PRN
  Administered 2017-02-02: 310 mL

## 2017-02-02 MED ORDER — NITROGLYCERIN 5 MG/ML IV SOLN
INTRAVENOUS | Status: AC
  Filled 2017-02-02: qty 10

## 2017-02-02 MED ORDER — NITROGLYCERIN 0.4 MG SL SUBL: 0.4000 mg | SUBLINGUAL_TABLET | SUBLINGUAL | Status: DC | PRN

## 2017-02-02 MED ORDER — METOPROLOL TARTRATE 25 MG PO TABS: 25.0000 mg | ORAL_TABLET | Freq: Two times a day (BID) | ORAL | Status: DC

## 2017-02-02 MED ORDER — LIDOCAINE HCL (PF) 1 % IJ SOLN
INTRAMUSCULAR | Status: AC
  Filled 2017-02-02: qty 30

## 2017-02-02 MED ORDER — HYDROMORPHONE HCL 1 MG/ML IJ SOLN
0.5000 mg | Freq: Once | INTRAMUSCULAR | Status: AC
  Administered 2017-02-02: 0.5 mg via INTRAVENOUS

## 2017-02-02 MED ORDER — ATORVASTATIN CALCIUM 20 MG PO TABS: 40.0000 mg | ORAL_TABLET | Freq: Every day | ORAL | Status: DC

## 2017-02-02 MED ORDER — ASPIRIN EC 81 MG PO TBEC
81.0000 mg | DELAYED_RELEASE_TABLET | Freq: Every day | ORAL | Status: DC
Start: 2017-02-03 — End: 2017-02-04
  Administered 2017-02-03 – 2017-02-04 (×2): 81 mg via ORAL
  Filled 2017-02-02 (×2): qty 1

## 2017-02-02 MED ORDER — ASPIRIN EC 81 MG PO TBEC: 81.0000 mg | DELAYED_RELEASE_TABLET | Freq: Every day | ORAL | Status: DC

## 2017-02-02 MED ORDER — HYDRALAZINE HCL 20 MG/ML IJ SOLN: 10.0000 mg | Freq: Four times a day (QID) | INTRAMUSCULAR | Status: DC | PRN

## 2017-02-02 MED ORDER — CLOPIDOGREL BISULFATE 75 MG PO TABS
ORAL_TABLET | ORAL | Status: AC
  Administered 2017-02-02: 75 mg via ORAL
  Filled 2017-02-02: qty 1

## 2017-02-02 MED ORDER — CLOPIDOGREL BISULFATE 75 MG PO TABS
75.0000 mg | ORAL_TABLET | Freq: Once | ORAL | Status: AC
  Administered 2017-02-02: 75 mg via ORAL

## 2017-02-02 MED ORDER — HEPARIN SODIUM (PORCINE) 5000 UNIT/ML IJ SOLN
4000.0000 [IU] | Freq: Once | INTRAMUSCULAR | Status: DC
Start: 2017-02-02 — End: 2017-02-04

## 2017-02-02 MED ORDER — METOPROLOL TARTRATE 5 MG/5ML IV SOLN
INTRAVENOUS | Status: AC
  Filled 2017-02-02: qty 5

## 2017-02-02 MED ORDER — SODIUM CHLORIDE 0.9 % IV SOLN
INTRAVENOUS | Status: AC | PRN
  Administered 2017-02-02: 1.75 mg/kg/h via INTRAVENOUS

## 2017-02-02 MED ORDER — BIVALIRUDIN TRIFLUOROACETATE 250 MG IV SOLR
INTRAVENOUS | Status: AC
Start: 2017-02-02 — End: 2017-02-02
  Filled 2017-02-02: qty 250

## 2017-02-02 MED ORDER — ACETAMINOPHEN 325 MG PO TABS: 650.0000 mg | ORAL_TABLET | ORAL | Status: DC | PRN

## 2017-02-02 MED ORDER — FENTANYL CITRATE (PF) 100 MCG/2ML IJ SOLN
INTRAMUSCULAR | Status: AC
  Filled 2017-02-02: qty 2

## 2017-02-02 MED ORDER — LIDOCAINE HCL (PF) 1 % IJ SOLN
INTRAMUSCULAR | Status: DC | PRN
  Administered 2017-02-02: 10 mL via SUBCUTANEOUS

## 2017-02-02 MED ORDER — ACETAMINOPHEN 325 MG PO TABS
650.0000 mg | ORAL_TABLET | ORAL | Status: DC | PRN
  Administered 2017-02-02: 650 mg via ORAL
  Filled 2017-02-02: qty 2

## 2017-02-02 MED ORDER — ASPIRIN 81 MG PO CHEW
324.0000 mg | CHEWABLE_TABLET | Freq: Once | ORAL | Status: AC
  Administered 2017-02-02: 324 mg via ORAL

## 2017-02-02 MED ORDER — SODIUM CHLORIDE 0.9 % IV SOLN
INTRAVENOUS | Status: DC
  Administered 2017-02-02: 10:00:00 via INTRAVENOUS

## 2017-02-02 MED ORDER — SODIUM CHLORIDE 0.9 % IV SOLN
0.2500 mg/kg/h | INTRAVENOUS | Status: AC
  Administered 2017-02-02: 0.25 mg/kg/h via INTRAVENOUS
  Filled 2017-02-02: qty 250

## 2017-02-02 MED ORDER — BIVALIRUDIN BOLUS VIA INFUSION - CUPID
INTRAVENOUS | Status: DC | PRN
  Administered 2017-02-02: 81.675 mg via INTRAVENOUS

## 2017-02-02 MED ORDER — CLOPIDOGREL BISULFATE 75 MG PO TABS
ORAL_TABLET | ORAL | Status: DC | PRN
  Administered 2017-02-02: 525 mg via ORAL

## 2017-02-02 MED ORDER — MORPHINE SULFATE (PF) 4 MG/ML IV SOLN
INTRAVENOUS | Status: AC
  Filled 2017-02-02: qty 1

## 2017-02-02 MED ORDER — MIDAZOLAM HCL 2 MG/2ML IJ SOLN
INTRAMUSCULAR | Status: AC
  Filled 2017-02-02: qty 2

## 2017-02-02 MED ORDER — ALPRAZOLAM 0.25 MG PO TABS: 0.2500 mg | ORAL_TABLET | Freq: Two times a day (BID) | ORAL | Status: DC | PRN

## 2017-02-02 MED ORDER — IOPAMIDOL (ISOVUE-370) INJECTION 76%: 100.0000 mL | Freq: Once | INTRAVENOUS | Status: DC | PRN

## 2017-02-02 MED ORDER — MORPHINE SULFATE (PF) 4 MG/ML IV SOLN
4.0000 mg | Freq: Once | INTRAVENOUS | Status: AC
  Administered 2017-02-02: 4 mg via INTRAVENOUS

## 2017-02-02 MED ORDER — METOPROLOL TARTRATE 50 MG PO TABS
50.0000 mg | ORAL_TABLET | Freq: Two times a day (BID) | ORAL | Status: DC
  Administered 2017-02-02 – 2017-02-04 (×3): 50 mg via ORAL
  Filled 2017-02-02 (×3): qty 1

## 2017-02-02 MED ORDER — MIDAZOLAM HCL 2 MG/2ML IJ SOLN
INTRAMUSCULAR | Status: DC | PRN
  Administered 2017-02-02: 1 mg via INTRAVENOUS

## 2017-02-02 MED ORDER — CLOPIDOGREL BISULFATE 75 MG PO TABS
ORAL_TABLET | ORAL | Status: AC
  Filled 2017-02-02: qty 3

## 2017-02-02 MED ORDER — HYDROMORPHONE HCL 1 MG/ML IJ SOLN
1.0000 mg | Freq: Once | INTRAMUSCULAR | Status: AC
  Administered 2017-02-02: 1 mg via INTRAVENOUS
  Filled 2017-02-02: qty 1

## 2017-02-02 MED ORDER — PANTOPRAZOLE SODIUM 40 MG PO TBEC
40.0000 mg | DELAYED_RELEASE_TABLET | Freq: Every day | ORAL | Status: DC
  Administered 2017-02-02 – 2017-02-04 (×3): 40 mg via ORAL
  Filled 2017-02-02 (×3): qty 1

## 2017-02-02 MED ORDER — LISINOPRIL 20 MG PO TABS
20.0000 mg | ORAL_TABLET | Freq: Every day | ORAL | Status: DC
  Administered 2017-02-02 – 2017-02-04 (×3): 20 mg via ORAL
  Filled 2017-02-02 (×3): qty 1

## 2017-02-02 MED ORDER — LISINOPRIL 5 MG PO TABS: 10.0000 mg | ORAL_TABLET | Freq: Every day | ORAL | Status: DC

## 2017-02-02 MED ORDER — HYDROMORPHONE HCL 1 MG/ML IJ SOLN
INTRAMUSCULAR | Status: AC
  Filled 2017-02-02: qty 1

## 2017-02-02 SURGICAL SUPPLY — 16 items
BALLN TREK RX 2.75X15 (BALLOONS) ×3
BALLOON TREK RX 2.75X15 (BALLOONS) ×1 IMPLANT
CATH INFINITI 5FR ANG PIGTAIL (CATHETERS) ×3 IMPLANT
CATH LAUNCHER 6FR JR4 (CATHETERS) ×3 IMPLANT
CATH VISTA GUIDE 6FR AL1 (CATHETERS) ×3 IMPLANT
CATH VISTA GUIDE 6FR AR1 (CATHETERS) ×3 IMPLANT
CATH VISTA GUIDE 6FR XB3.5 (CATHETERS) ×3 IMPLANT
DEVICE CLOSURE MYNXGRIP 6/7F (Vascular Products) ×3 IMPLANT
DEVICE INFLAT 30 PLUS (MISCELLANEOUS) ×3 IMPLANT
KIT MANI 3VAL PERCEP (MISCELLANEOUS) ×3 IMPLANT
NEEDLE PERC 18GX7CM (NEEDLE) ×3 IMPLANT
PACK CARDIAC CATH (CUSTOM PROCEDURE TRAY) ×3 IMPLANT
SHEATH AVANTI 6FR X 11CM (SHEATH) ×3 IMPLANT
STENT XIENCE ALPINE RX 3.0X23 (Permanent Stent) ×3 IMPLANT
WIRE EMERALD 3MM-J .035X150CM (WIRE) ×3 IMPLANT
WIRE G HI TQ BMW 190 (WIRE) ×3 IMPLANT

## 2017-02-02 NOTE — ED Notes (Signed)
Pt began to complain of increasing pain.  Pt became red in the face and was unable to tolerate his pain.  A repeat EKG was performed.

## 2017-02-02 NOTE — Progress Notes (Signed)
Brief Note Patient admitted with STEMI and underwent PCI to distal circumflex by cardiology. Clinically stable in ICU post-procedure. No critical care needs at this time. Will be available as needed.  Bryce GibbsPrag Kameshia Madruga MD PCCM

## 2017-02-02 NOTE — ED Notes (Signed)
Dr. Chen at bedside.

## 2017-02-02 NOTE — Progress Notes (Signed)
NP made aware of new critical troponin lab value 25.8. No new order at this time. Will continue to monitor patient.

## 2017-02-02 NOTE — ED Notes (Addendum)
4000 units given per verbal order

## 2017-02-02 NOTE — Consult Note (Signed)
Reason for Consult: STEMI inferior wall Referring Physician: Emergency room Dr Margaretmary Eddy hospitalist Cardiologist Dr. Beather Arbour Bryce [name] is an 51 y.o. male.  HPI: This 51 year old obese white male smoker known coronary disease history of PCI and stent in 2012 to the LAD with a bare metal patient presents with nondiagnostic EKG but subsequently developed profound chest pain 10 out of 10 with ST elevation inferiorly. At around 9:30 STEMI was called and patient was then brought to the catheter lab for intervention to treat he was given heparin in the ER low dose of Plavix aspirin morphine did not tolerate nitrates was hypertensive in emergency room with a persistent high-level chest discomfort. Patient with diaphoretic and short of breath. Patient follows with Dr. Tomasita Crumble shows hasn't seen about a year unfortunately patient still smokes  Past Medical History:  Diagnosis Date  . Chronic systolic congestive heart failure ([place])   . Coronary artery disease   . Hypertension   . Ischemic cardiomyopathy   . MI (myocardial infarction) [facility])     Past Surgical History:  Procedure Laterality Date  . CARDIAC CATHETERIZATION N/A 12/28/2014   Procedure: Left Heart Cath and Coronary Angiography;  Surgeon: Yolonda Kida, MD;  Location: [place] CV LAB;  Service: Cardiovascular;  Laterality: N/A;  . CORONARY ANGIOPLASTY     Non-ST elevation MI status post stenting within bare-metal stent of the 75% lesion of the LAD, 11/13/2010.     Family History  Problem Relation Age of Onset  . CAD Father     Social History:  reports that he has been smoking Cigarettes.  He has been smoking about 1.00 pack per day. He has never used smokeless tobacco. He reports that he drinks about 0.6 oz of alcohol per week . He reports that he uses drugs, including Marijuana.  Allergies: No Known Allergies  Medications: I have reviewed the patient's current medications.  Results for orders placed or performed during  the hospital encounter of 02/02/17 (from the past 48 hour(s))  CBC     Status: Abnormal   Collection Time: 02/02/17  7:42 AM  Result Value Ref Range   WBC 4.7 3.8 - 10.6 K/uL   RBC 4.23 (L) 4.40 - 5.90 MIL/uL   Hemoglobin 14.1 13.0 - 18.0 g/dL   HCT 40.9 40.0 - 52.0 %   MCV 96.7 80.0 - 100.0 fL   MCH 33.4 26.0 - 34.0 pg   MCHC 34.5 32.0 - 36.0 g/dL   RDW 14.8 (H) 11.5 - 14.5 %   Platelets 192 150 - 440 K/uL  Basic metabolic panel     Status: Abnormal   Collection Time: 02/02/17  8:36 AM  Result Value Ref Range   Sodium 141 135 - 145 mmol/L   Potassium 4.4 3.5 - 5.1 mmol/L   Chloride 112 (H) 101 - 111 mmol/L   CO2 25 22 - 32 mmol/L   Glucose, Bld 114 (H) 65 - 99 mg/dL   BUN 14 6 - 20 mg/dL   Creatinine, Ser 0.99 0.61 - 1.24 mg/dL   Calcium 9.0 8.9 - 10.3 mg/dL   GFR calc non Af Amer >60 >60 mL/min   GFR calc Af Amer >60 >60 mL/min    Comment: (NOTE) The eGFR has been calculated using the CKD EPI equation. This calculation has not been validated in all clinical situations. eGFR's persistently <60 mL/min signify possible Chronic Kidney Disease.    Anion gap 4 (L) 5 - 15  Troponin I  Status: None   Collection Time: 02/02/17  8:36 AM  Result Value Ref Range   Troponin I <0.03 <0.03 ng/mL  Protime-INR     Status: None   Collection Time: 02/02/17  9:50 AM  Result Value Ref Range   Prothrombin Time 12.9 11.4 - 15.2 seconds   INR 0.98   POCT Activated clotting time     Status: None   Collection Time: 02/02/17 11:03 AM  Result Value Ref Range   Activated Clotting Time 356 seconds    Dg Chest 2 View  Result Date: 02/02/2017 CLINICAL DATA:  Sharp and constant chest pain beginning at 4 a.m. today. Pain extends to the jaw and left upper extremity. EXAM: CHEST  2 VIEW COMPARISON:  Two-view chest x-ray 12/16/2016. FINDINGS: The heart is enlarged. Lung volumes are low. Mild interstitial prominence is similar the prior exam. No airspace consolidation is present. There are no  significant effusions. Multilevel degenerative changes of the thoracic spine are stable. IMPRESSION: 1. Stable cardiomegaly and mild interstitial prominence without frank edema. 2. No focal airspace disease. Electronically Signed   By: San Morelle M.D.   On: 02/02/2017 07:58    Review of Systems  Constitutional: Positive for diaphoresis and malaise/fatigue.  HENT: Negative.   Eyes: Negative.   Respiratory: Positive for shortness of breath.   Cardiovascular: Positive for chest pain.  Gastrointestinal: Negative.   Genitourinary: Negative.   Musculoskeletal: Negative.   Skin: Negative.   Neurological: Negative.   Endo/Heme/Allergies: Negative.   Psychiatric/Behavioral: Negative.    Blood pressure (!) 172/113, pulse 87, temperature 97.7 F (36.5 C), temperature source Oral, resp. rate 16, height _0  (1.727 m), weight 108.9 kg (240 lb), SpO2 98 %. Physical Exam  Nursing note and vitals reviewed. Constitutional: He is oriented to person, place, and time. He appears well-developed and well-nourished.  HENT:  Head: Normocephalic and atraumatic.  Eyes: Pupils are equal, round, and reactive to light. Conjunctivae and EOM are normal.  Neck: Normal range of motion. Neck supple.  Cardiovascular: Normal rate and regular rhythm.   Murmur heard. Respiratory: Effort normal and breath sounds normal.  GI: Soft. Bowel sounds are normal.  Musculoskeletal: Normal range of motion.  Neurological: He is alert and oriented to person, place, and time. He has normal reflexes.  Skin: Skin is warm and dry.  Psychiatric: He has a normal mood and affect.    Assessment/Plan: STEMI inferior wall Hypertension Smoking Obesity Coronary artery disease Hyperlipidemia Possible obstructive sleep apnea . Plan Recommend take to the catheter PCI and stent intention to treat for STEMI Continue Plavix 75 mg once a day for at least 1 year Recommend beta-blockade therapy Start ACE inhibitor  therapy Statin therapy with Lipitor 80 mg once a day Advised patient quit smoking Recommend diet and exercise Consider echocardiogram Has patient follow-up with Dr. Saralyn Pilar as an outpatient one to 2 weeks Recommend sleep study CPAP weight loss  Dwayne D Callwood 02/02/2017, 11:50 AM

## 2017-02-02 NOTE — H&P (Signed)
[employer] [facility] [place] [facility]   PATIENT NAME: Bryce [name]    MR#:  [id number]  DATE OF BIRTH:  09-May-1966  DATE OF ADMISSION:  02/02/2017  PRIMARY CARE PHYSICIAN: Patient, No Pcp Per   REQUESTING/REFERRING PHYSICIAN: Jene Every, MD  CHIEF COMPLAINT:   Chief Complaint  Patient presents with  . Chest Pain  . Code STEMI   Chest pain 1 hour today. HISTORY OF PRESENT ILLNESS:  Bryce [name]  is a 51 y.o. male with a known history of CAD, MI, s/p stent,  HTN, chronic systolic CHF. The patient come to ED due to chest discomfort. Chest pain is worsening in ED, sharp, constant, 12/10 without radiation. He also has nausea and diaphoresis. The first and second EKG is unremarkable but the 3rd one show ST elevation. Troponin is 0.03. The patient was given ASA, plavix, nitro and dilaudid. Dr. Cyril Loosen discussed with Dr. Juliann Pares, who will do cardiac cath. He is not taking any medication at home.  PAST MEDICAL HISTORY:   Past Medical History:  Diagnosis Date  . Chronic systolic congestive heart failure (HCC)   . Coronary artery disease   . Hypertension   . Ischemic cardiomyopathy   . MI (myocardial infarction) (HCC)     PAST SURGICAL HISTORY:   Past Surgical History:  Procedure Laterality Date  . CARDIAC CATHETERIZATION N/A 12/28/2014   Procedure: Left Heart Cath and Coronary Angiography;  Surgeon: Alwyn Pea, MD;  Location: [facility] INVASIVE CV LAB;  Service: Cardiovascular;  Laterality: N/A;  . CORONARY ANGIOPLASTY     Non-ST elevation MI status post stenting within bare-metal stent of the 75% lesion of the LAD, 11/13/2010.     SOCIAL HISTORY:   Social History  Substance Use Topics  . Smoking status: Current Every Day Smoker    Packs/day: 1.00    Types: Cigarettes  . Smokeless tobacco: Never Used  . Alcohol use 0.6 oz/week    1 Cans of beer per week     Comment: "every now and then"    FAMILY HISTORY:   Family History  Problem Relation  Age of Onset  . CAD Father     DRUG ALLERGIES:  No Known Allergies  REVIEW OF SYSTEMS:   Review of Systems  Constitutional: Positive for diaphoresis. Negative for chills, fever and malaise/fatigue.  HENT: Negative for sore throat.   Eyes: Negative for blurred vision and double vision.  Respiratory: Negative for cough, hemoptysis, shortness of breath, wheezing and stridor.   Cardiovascular: Positive for chest pain. Negative for palpitations, orthopnea and leg swelling.  Gastrointestinal: Positive for nausea. Negative for abdominal pain, blood in stool, diarrhea, melena and vomiting.  Genitourinary: Negative for dysuria, flank pain and hematuria.  Musculoskeletal: Negative for back pain and joint pain.  Skin: Negative for rash.  Neurological: Negative for dizziness, sensory change, focal weakness, seizures, loss of consciousness, weakness and headaches.  Endo/Heme/Allergies: Negative for polydipsia.  Psychiatric/Behavioral: Negative for depression. The patient is not nervous/anxious.     MEDICATIONS AT HOME:   Prior to Admission medications   Medication Sig Start Date End Date Taking? Authorizing Provider  cyclobenzaprine (FLEXERIL) 10 MG tablet Take 1 tablet (10 mg total) by mouth 3 (three) times daily as needed for muscle spasms. Patient not taking: Reported on 02/02/2017 12/16/16   Minna Antis, MD  diazepam (VALIUM) 5 MG tablet Take 1 tablet (5 mg total) by mouth every 8 (eight) hours as needed for muscle spasms. Patient not taking: Reported on 12/16/2016  10/22/16   Emily FilbertWilliams, Jonathan E, MD  HYDROcodone-acetaminophen (NORCO/VICODIN) 5-325 MG tablet Take 1 tablet by mouth every 4 (four) hours as needed. Patient not taking: Reported on 02/02/2017 12/16/16   Minna AntisPaduchowski, Kevin, MD  ibuprofen (ADVIL,MOTRIN) 600 MG tablet Take 1 tablet (600 mg total) by mouth every 8 (eight) hours as needed. Patient not taking: Reported on 02/02/2017 10/22/16   Emily FilbertWilliams, Jonathan E, MD  metoprolol  tartrate (LOPRESSOR) 25 MG tablet Take 0.5 tablets (12.5 mg total) by mouth 2 (two) times daily. Patient not taking: Reported on 10/22/2016 12/23/14   Gale JourneyWalsh, Catherine P, MD  pravastatin (PRAVACHOL) 40 MG tablet Take 1 tablet (40 mg total) by mouth daily. Patient not taking: Reported on 10/22/2016 12/23/14   Gale JourneyWalsh, Catherine P, MD  predniSONE (DELTASONE) 10 MG tablet Take 1 tablet (10 mg total) by mouth daily. Day 1-3: take 4 tablets PO daily Day 4-6: take 3 tablets PO daily Day 7-9: take 2 tablets PO daily Day 10-12: take 1 tablet PO daily Patient not taking: Reported on 02/02/2017 12/16/16   Minna AntisPaduchowski, Kevin, MD  traMADol (ULTRAM) 50 MG tablet Take 1 tablet (50 mg total) by mouth every 6 (six) hours as needed. Patient not taking: Reported on 12/16/2016 12/01/16 12/01/17  Little, Traci M, PA-C      VITAL SIGNS:  Blood pressure (!) 172/113, pulse 87, temperature 97.7 F (36.5 C), temperature source Oral, resp. rate 16, height 5\' 8"  (1.727 m), weight 240 lb (108.9 kg), SpO2 98 %.  PHYSICAL EXAMINATION:  Physical Exam  Constitutional: He is oriented to person, place, and time.  Morbid obese  HENT:  Head: Normocephalic.  Mouth/Throat: Oropharynx is clear and moist.  Eyes: Pupils are equal, round, and reactive to light. Conjunctivae and EOM are normal. No scleral icterus.  Neck: Normal range of motion. Neck supple. No JVD present. No tracheal deviation present.  Cardiovascular: Normal rate, regular rhythm and normal heart sounds.  Exam reveals no gallop.   No murmur heard. Pulmonary/Chest: Effort normal and breath sounds normal. No respiratory distress. He has no wheezes. He has no rales.  Abdominal: Soft. Bowel sounds are normal. He exhibits no distension. There is no tenderness. There is no rebound.  Musculoskeletal: Normal range of motion. He exhibits no edema or tenderness.  Neurological: He is alert and oriented to person, place, and time. No cranial nerve deficit.  Skin: No rash noted. No  erythema.  Psychiatric: Affect normal.   LABORATORY PANEL:   CBC  Recent Labs Lab 02/02/17 0742  WBC 4.7  HGB 14.1  HCT 40.9  PLT 192   ------------------------------------------------------------------------------------------------------------------  Chemistries   Recent Labs Lab 02/02/17 0836  NA 141  K 4.4  CL 112*  CO2 25  GLUCOSE 114*  BUN 14  CREATININE 0.99  CALCIUM 9.0   ------------------------------------------------------------------------------------------------------------------  Cardiac Enzymes  Recent Labs Lab 02/02/17 0836  TROPONINI <0.03   ------------------------------------------------------------------------------------------------------------------  RADIOLOGY:  Dg Chest 2 View  Result Date: 02/02/2017 CLINICAL DATA:  Sharp and constant chest pain beginning at 4 a.m. today. Pain extends to the jaw and left upper extremity. EXAM: CHEST  2 VIEW COMPARISON:  Two-view chest x-ray 12/16/2016. FINDINGS: The heart is enlarged. Lung volumes are low. Mild interstitial prominence is similar the prior exam. No airspace consolidation is present. There are no significant effusions. Multilevel degenerative changes of the thoracic spine are stable. IMPRESSION: 1. Stable cardiomegaly and mild interstitial prominence without frank edema. 2. No focal airspace disease. Electronically Signed   By: Marin Robertshristopher  Mattern  M.D.   On: 02/02/2017 07:58      IMPRESSION AND PLAN:   STEMI Admit to stepdown. Start heparin drip, follow up Dr. Juliann Pares for cardiac cath today. Start ASA, plavix, Lipitor. Check lipid panel. Follow up troponin.  HTN, start lopressor and lisinopril. IV hydralazine prn.  Chronic systolic CHF. Stable.  Morbid obesity.  Tobacco abuse. Smoking cessation counseled for 4 minutes.  All the records are reviewed and case discussed with ED provider. Management plans discussed with the patient, his wife and they are in agreement.  CODE STATUS:  Full code.  TOTAL TIME TAKING CARE OF THIS PATIENT: 56 minutes.    Shaune Pollack M.D on 02/02/2017 at 10:21 AM  Between 7am to 6pm - Pager - [phone]  After 6pm go to [email or web address] - Social research officer, government  [facility] [place] [facility]  Office  [phone]  CC: Primary care physician; Patient, No Pcp Per   Note: This dictation was prepared with Dragon dictation along with smaller phrase technology. Any transcriptional errors that result from this process are unin

## 2017-02-02 NOTE — Progress Notes (Signed)
Chaplain received Code STEMI. Provided ministry of presence and offered prayer and words of encouragement to patient, wife, and mother. Transported family to Cath Lab Waiting area.     02/02/17 0935  Clinical Encounter Type  Visited With Patient;Patient and family together;Health care provider  Visit Type Initial;Spiritual support;Code  Referral From Nurse  Consult/Referral To Chaplain  Spiritual Encounters  Spiritual Needs Prayer;Emotional

## 2017-02-02 NOTE — ED Notes (Signed)
EKG performed and signed by MD. Pt taken to x-ray and then to room 10

## 2017-02-02 NOTE — ED Notes (Signed)
Patient transported to XR. 

## 2017-02-02 NOTE — ED Provider Notes (Signed)
[facility] Emergency Department Provider Note   ____________________________________________    I have reviewed the triage vital signs and the nursing notes.   HISTORY  Chief Complaint Chest Pain     HPI Bryce [name] is a 51 y.o. male who presents with complaints of mild chest discomfort which he describes as a slight pressure. He reports he has more discomfort in his jaw and left arm. Patient's concerned because he has a history of coronary artery disease. He had a bare-metal stent placed in 2012. He does not take any medications because he says they make him feel sick while working out in the heat, he lays brickt for a living.He denies shortness of breath. He does smoke occasionally as well. No leg swelling or cramping. No fevers or chills or cough. Last had a cath one year ago which showed a patent stent   Past Medical History:  Diagnosis Date  . Chronic systolic congestive heart failure (HCC)   . Coronary artery disease   . Hypertension   . Ischemic cardiomyopathy   . MI (myocardial infarction) [facility])     Patient Active Problem List   Diagnosis Date Noted  . AKI (acute kidney injury) (HCC) 12/15/2015  . Solitary pulmonary nodule 12/23/2014  . Chest pain 12/22/2014  . Essential hypertension 12/22/2014  . Hyperlipidemia 12/22/2014    Past Surgical History:  Procedure Laterality Date  . CARDIAC CATHETERIZATION N/A 12/28/2014   Procedure: Left Heart Cath and Coronary Angiography;  Surgeon: Alwyn Pea, MD;  Location: [facility] INVASIVE CV LAB;  Service: Cardiovascular;  Laterality: N/A;  . CORONARY ANGIOPLASTY     Non-ST elevation MI status post stenting within bare-metal stent of the 75% lesion of the LAD, 11/13/2010.     Prior to Admission medications   Medication Sig Start Date End Date Taking? Authorizing Provider  cyclobenzaprine (FLEXERIL) 10 MG tablet Take 1 tablet (10 mg total) by mouth 3 (three) times daily as needed for  muscle spasms. Patient not taking: Reported on 02/02/2017 12/16/16   Minna Antis, MD  diazepam (VALIUM) 5 MG tablet Take 1 tablet (5 mg total) by mouth every 8 (eight) hours as needed for muscle spasms. Patient not taking: Reported on 12/16/2016 10/22/16   Emily Filbert, MD  HYDROcodone-acetaminophen (NORCO/VICODIN) 5-325 MG tablet Take 1 tablet by mouth every 4 (four) hours as needed. Patient not taking: Reported on 02/02/2017 12/16/16   Minna Antis, MD  ibuprofen (ADVIL,MOTRIN) 600 MG tablet Take 1 tablet (600 mg total) by mouth every 8 (eight) hours as needed. Patient not taking: Reported on 02/02/2017 10/22/16   Emily Filbert, MD  metoprolol tartrate (LOPRESSOR) 25 MG tablet Take 0.5 tablets (12.5 mg total) by mouth 2 (two) times daily. Patient not taking: Reported on 10/22/2016 12/23/14   Gale Journey, MD  pravastatin (PRAVACHOL) 40 MG tablet Take 1 tablet (40 mg total) by mouth daily. Patient not taking: Reported on 10/22/2016 12/23/14   Gale Journey, MD  predniSONE (DELTASONE) 10 MG tablet Take 1 tablet (10 mg total) by mouth daily. Day 1-3: take 4 tablets PO daily Day 4-6: take 3 tablets PO daily Day 7-9: take 2 tablets PO daily Day 10-12: take 1 tablet PO daily Patient not taking: Reported on 02/02/2017 12/16/16   Minna Antis, MD  traMADol (ULTRAM) 50 MG tablet Take 1 tablet (50 mg total) by mouth every 6 (six) hours as needed. Patient not taking: Reported on 12/16/2016 12/01/16 12/01/17  Little, Jordan Likes,  PA-C     Allergies Patient has no known allergies.  Family History  Problem Relation Age of Onset  . CAD Father     Social History Social History  Substance Use Topics  . Smoking status: Current Every Day Smoker    Packs/day: 1.00    Types: Cigarettes  . Smokeless tobacco: Never Used  . Alcohol use 0.6 oz/week    1 Cans of beer per week     Comment: "every now and then"    Review of Systems  Constitutional: No fever/chills Eyes: No visual  changes.  ENT: No sore throat. Cardiovascular:As above Respiratory: Denies shortness of breath. Gastrointestinal: No abdominal pain.  No nausea, no vomiting.   Genitourinary: Negative for dysuria. Musculoskeletal: Negative for back pain. Skin: Negative for rash. Neurological: Negative for headaches   ____________________________________________   PHYSICAL EXAM:  VITAL SIGNS: ED Triage Vitals  Enc Vitals Group     BP 02/02/17 0747 (!) 171/109     Pulse Rate 02/02/17 0747 87     Resp 02/02/17 0747 20     Temp 02/02/17 0747 97.7 F (36.5 C)     Temp Source 02/02/17 0747 Oral     SpO2 02/02/17 0747 97 %     Weight 02/02/17 0744 108.9 kg (240 lb)     Height 02/02/17 0744 1.727 m (5\' 8" )     Head Circumference --      Peak Flow --      Pain Score 02/02/17 0743 8     Pain Loc --      Pain Edu? --      Excl. in GC? --     Constitutional: Alert and oriented. No acute distress. Pleasant and interactive Eyes: Conjunctivae are normal.   Nose: No congestion/rhinnorhea. Mouth/Throat: Mucous membranes are moist.    Cardiovascular: Normal rate, regular rhythm. Grossly normal heart sounds.  Good peripheral circulation. Respiratory: Normal respiratory effort.  No retractions. Lungs CTAB. Gastrointestinal: Soft and nontender. No distention.  No CVA tenderness. Genitourinary: deferred Musculoskeletal: No lower extremity tenderness nor edema.  Warm and well perfused Neurologic:  Normal speech and language. No gross focal neurologic deficits are appreciated.  Skin:  Skin is warm, dry and intact. No rash noted. Psychiatric: Mood and affect are normal. Speech and behavior are normal.  ____________________________________________   LABS (all labs ordered are listed, but only abnormal results are displayed)  Labs Reviewed  CBC - Abnormal; Notable for the following:       Result Value   RBC 4.23 (*)    RDW 14.8 (*)    All other components within normal limits  BASIC METABOLIC PANEL  - Abnormal; Notable for the following:    Chloride 112 (*)    Glucose, Bld 114 (*)    Anion gap 4 (*)    All other components within normal limits  TROPONIN I  PROTIME-INR   ____________________________________________  EKG  ED ECG REPORT I, Jene Every, the attending physician, personally viewed and interpreted this ECG.  Date: 02/02/2017  Rate: 75 Rhythm: normal sinus rhythm QRS Axis: normal Intervals: normal ST/T Wave abnormalities: normal   ____________________________________________  RADIOLOGY  Chest x-ray unremarkable ____________________________________________   PROCEDURES  Procedure(s) performed: No    Critical Care performed: yes  CRITICAL CARE Performed by: Jene Every   Total critical care time:30 minutes  Critical care time was exclusive of separately billable procedures and treating other patients.  Critical care was necessary to treat or prevent imminent or life-threatening deterioration.  Critical care was time spent personally by me on the following activities: development of treatment plan with patient and/or surrogate as well as nursing, discussions with consultants, evaluation of patient's response to treatment, examination of patient, obtaining history from patient or surrogate, ordering and performing treatments and interventions, ordering and review of laboratory studies, ordering and review of radiographic studies, pulse oximetry and re-evaluation of patient's condition.  ____________________________________________   INITIAL IMPRESSION / ASSESSMENT AND PLAN / ED COURSE  Pertinent labs & imaging results that were available during my care of the patient were reviewed by me and considered in my medical decision making (see chart for details).  Patient with a history of coronary disease presents today with discomfort of the jaw on the left arm, very mild chest pressure. We will treat with nitroglycerin and then Tylenol for the  headache that he says he always gets. He already took aspirin today. EKG is reassuring   ----------------------------------------- 8:43 AM on 02/02/2017 -----------------------------------------  Patient reports his pain has increased slightly after nitroglycerin. Repeat EKG may show small elevation in III. We will give morphine and Zofran and continue to monitor  ----------------------------------------- 9:25 AM on 02/02/2017 -----------------------------------------  Patient feels even worse now he is clutching his chest. Repeat EKG is concerning for STEMI as elevations in II, III and st depression in AVL. Code STEMI called.  Discussed with Dr. Juliann Paresallwood, he recommends heparin, 5 lopressor, and plavix    ____________________________________________   FINAL CLINICAL IMPRESSION(S) / ED DIAGNOSES  Final diagnoses:  ST elevation myocardial infarction (STEMI), unspecified artery (HCC)      NEW MEDICATIONS STARTED DURING THIS VISIT:  New Prescriptions   No medications on file     Note:  This document was prepared using Dragon voice recognition software and may include unintentional dictation errors.    Jene EveryKinner, Momodou Consiglio, MD 02/02/17 [phone]

## 2017-02-02 NOTE — ED Triage Notes (Signed)
Pt arrives from home c/o chest pain this morning; awoke with left jaw pain and left arm pain. Pt states blood pressure has been high. Hx MI in past. Last stent placed 2012.

## 2017-02-03 ENCOUNTER — Inpatient Hospital Stay
Admit: 2017-02-03 | Discharge: 2017-02-03 | Disposition: A | Discharge status: Home / Self Care | Payer: Self-pay | Attending: Internal Medicine | Admitting: Internal Medicine

## 2017-02-03 LAB — CBC
HCT: 36.7 % — ABNORMAL LOW (ref 40.0–52.0)
Hemoglobin: 12.5 g/dL — ABNORMAL LOW (ref 13.0–18.0)
MCH: 32.5 pg (ref 26.0–34.0)
MCHC: 34.1 g/dL (ref 32.0–36.0)
MCV: 95.5 fL (ref 80.0–100.0)
PLATELETS: 177 10*3/uL (ref 150–440)
RBC: 3.84 MIL/uL — ABNORMAL LOW (ref 4.40–5.90)
RDW: 14.8 % — AB (ref 11.5–14.5)
WBC: 6.6 10*3/uL (ref 3.8–10.6)

## 2017-02-03 LAB — BASIC METABOLIC PANEL
ANION GAP: 4 — AB (ref 5–15)
BUN: 15 mg/dL (ref 6–20)
CALCIUM: 8.2 mg/dL — AB (ref 8.9–10.3)
CO2: 27 mmol/L (ref 22–32)
CREATININE: 0.96 mg/dL (ref 0.61–1.24)
Chloride: 107 mmol/L (ref 101–111)
GFR calc Af Amer: >60 mL/min (ref >60)
GLUCOSE: 105 mg/dL — AB (ref 65–99)
Potassium: 4.1 mmol/L (ref 3.5–5.1)
Sodium: 138 mmol/L (ref 135–145)

## 2017-02-03 LAB — LIPID PANEL
CHOL/HDL RATIO: 5.4 ratio
Cholesterol: 190 mg/dL (ref 0–200)
HDL: 35 mg/dL — AB (ref >40)
LDL CALC: 119 mg/dL — AB (ref 0–99)
TRIGLYCERIDES: 178 mg/dL — AB (ref <150)
VLDL: 36 mg/dL (ref 0–40)

## 2017-02-03 LAB — ECHOCARDIOGRAM COMPLETE
Height: 68 in
Weight: 4007.08 oz

## 2017-02-03 LAB — PHOSPHORUS: Phosphorus: 2.7 mg/dL (ref 2.5–4.6)

## 2017-02-03 LAB — MAGNESIUM: Magnesium: 2 mg/dL (ref 1.7–2.4)

## 2017-02-03 LAB — TROPONIN I: TROPONIN I: 15.11 ng/mL — AB (ref <0.03)

## 2017-02-03 MED ORDER — CALCIUM CARBONATE ANTACID 500 MG PO CHEW: 200.0000 mg | CHEWABLE_TABLET | Freq: Three times a day (TID) | ORAL | Status: DC

## 2017-02-03 MED ORDER — CALCIUM CARBONATE ANTACID 500 MG PO CHEW
200.0000 mg | CHEWABLE_TABLET | Freq: Three times a day (TID) | ORAL | Status: DC
  Administered 2017-02-03 – 2017-02-04 (×3): 200 mg via ORAL
  Filled 2017-02-03 (×3): qty 1

## 2017-02-03 MED ORDER — CLOPIDOGREL BISULFATE 75 MG PO TABS
75.0000 mg | ORAL_TABLET | Freq: Every day | ORAL | Status: DC
  Administered 2017-02-03 – 2017-02-04 (×2): 75 mg via ORAL
  Filled 2017-02-03 (×2): qty 1

## 2017-02-03 MED ORDER — ALUM & MAG HYDROXIDE-SIMETH 200-200-20 MG/5ML PO SUSP
30.0000 mL | Freq: Once | ORAL | Status: AC
  Administered 2017-02-03: 30 mL via ORAL
  Filled 2017-02-03: qty 30

## 2017-02-03 NOTE — Care Management (Signed)
Met with patient to discuss discharge planning. He lives with his wife that is a patient of [facility]. He does not have health insurance or a PCP. He states he is supposed to start a new job that will provide insurance after 90-days.  He appreciates referral to [facility].  Referral sent. He has transportation.

## 2017-02-03 NOTE — Progress Notes (Signed)
Patient c/o of what he says feels like a big gas bubble in midsternal area, he states that it feels like his food is kind of just sitting there and not going down. Patient was given Maalox earlier for indigestion but that didn't relieve it. VSS, MD paged and notified. Orders to give Tums tid with meals. Will give and continue to monitor.

## 2017-02-03 NOTE — Progress Notes (Signed)
Report called and given to Serenidy RN on 2A, family at bedside aware of transfer. VS wnl, pt alert and oriented. No ss of distress.

## 2017-02-03 NOTE — Progress Notes (Addendum)
[employer] [facility] [place] [facility] [place] [facility]   PATIENT NAME: Bryce [name]    MR#:  [id number]  DATE OF BIRTH:  [birth year]  SUBJECTIVE:  CHIEF COMPLAINT:   Chief Complaint  Patient presents with  . Chest Pain  . Code STEMI   No complaint. REVIEW OF SYSTEMS:  Review of Systems  Constitutional: Negative for chills, fever and malaise/fatigue.  HENT: Negative for sore throat.   Eyes: Negative for blurred vision and double vision.  Respiratory: Negative for cough, hemoptysis, shortness of breath, wheezing and stridor.   Cardiovascular: Negative for chest pain, palpitations, orthopnea and leg swelling.  Gastrointestinal: Negative for abdominal pain, blood in stool, diarrhea, melena, nausea and vomiting.  Genitourinary: Negative for dysuria, flank pain and hematuria.  Musculoskeletal: Negative for back pain and joint pain.  Neurological: Negative for dizziness, sensory change, focal weakness, seizures, loss of consciousness, weakness and headaches.  Endo/Heme/Allergies: Negative for polydipsia.  Psychiatric/Behavioral: Negative for depression. The patient is not nervous/anxious.     DRUG ALLERGIES:  No Known Allergies VITALS:  Blood pressure (!) 119/59, pulse 68, temperature 98.1 F (36.7 C), temperature source Oral, resp. rate 20, height 5\' 8"  (1.727 m), weight 250 lb 7.1 oz (113.6 kg), SpO2 97 %. PHYSICAL EXAMINATION:  Physical Exam  Constitutional: He is oriented to person, place, and time and well-developed, well-nourished, and in no distress.  Morbid obesity  HENT:  Head: Normocephalic.  Mouth/Throat: Oropharynx is clear and moist.  Eyes: Pupils are equal, round, and reactive to light. Conjunctivae and EOM are normal. No scleral icterus.  Neck: Normal range of motion. Neck supple. No JVD present. No tracheal deviation present.  Cardiovascular: Normal rate, regular rhythm and normal heart sounds.  Exam reveals no gallop.   No murmur heard. Pulmonary/Chest:  Effort normal and breath sounds normal. No respiratory distress. He has no wheezes. He has no rales.  Abdominal: Soft. Bowel sounds are normal. He exhibits no distension. There is no tenderness. There is no rebound.  Musculoskeletal: Normal range of motion. He exhibits no edema or tenderness.  Neurological: He is alert and oriented to person, place, and time. No cranial nerve deficit.  Skin: No rash noted. No erythema.  Psychiatric: Affect normal.   LABORATORY PANEL:  Male CBC  Recent Labs Lab 02/03/17 0414  WBC 6.6  HGB 12.5*  HCT 36.7*  PLT 177   ------------------------------------------------------------------------------------------------------------------ Chemistries   Recent Labs Lab 02/03/17 0414  NA 138  K 4.1  CL 107  CO2 27  GLUCOSE 105*  BUN 15  CREATININE 0.96  CALCIUM 8.2*  MG 2.0   RADIOLOGY:  No results found. ASSESSMENT AND PLAN:   STEMI S/p PCI and stent to mid to distal circumflex. Off heparin drip, follow up Dr. Juliann Paresallwood for cardiac cath today. Continue ASA, plavix, Lipitor.  Continue Plavix 75 mg once a day for at least 1 year per Dr. Juliann Paresallwood.  HTN, started lopressor and lisinopril. IV hydralazine prn.  HLP. LDL 119, on Lipitor 80 mg po daily.  Chronic systolic CHF. Stable. Echo: LV EF: 45% -   50%.  Morbid obesity. Recommend sleep study CPAP weight loss.  Tobacco abuse. Smoking cessation counseled for 4 minutes.  All the records are reviewed and case discussed with Care Management/Social Worker. Management plans discussed with the patient, family and they are in agreement.  CODE STATUS: Full Code  TOTAL TIME TAKING CARE OF THIS PATIENT: 26 minutes.   More than 50% of the time was spent in counseling/coordination of  care: YES  POSSIBLE D/C IN 1 DAYS, DEPENDING ON CLINICAL CONDITION.   Shaune Pollack M.D on 02/03/2017 at 2:46 PM  Between 7am to 6pm - Pager - [phone]  After 6pm go to [email or web address] - [employer]

## 2017-02-04 ENCOUNTER — Encounter: Payer: Self-pay | Admitting: Internal Medicine

## 2017-02-04 LAB — HIV ANTIBODY (ROUTINE TESTING W REFLEX): HIV Screen 4th Generation wRfx: NONREACTIVE

## 2017-02-04 MED ORDER — PANTOPRAZOLE SODIUM 40 MG PO TBEC: 40.0000 mg | DELAYED_RELEASE_TABLET | Freq: Every day | ORAL | 0 refills | Status: DC

## 2017-02-04 MED ORDER — ASPIRIN 81 MG PO TBEC: 81.0000 mg | DELAYED_RELEASE_TABLET | Freq: Every day | ORAL | 0 refills | Status: AC

## 2017-02-04 MED ORDER — METOPROLOL TARTRATE 50 MG PO TABS: 50.0000 mg | ORAL_TABLET | Freq: Two times a day (BID) | ORAL | 1 refills | Status: DC

## 2017-02-04 MED ORDER — CLOPIDOGREL BISULFATE 75 MG PO TABS: 75.0000 mg | ORAL_TABLET | Freq: Every day | ORAL | 0 refills | Status: DC

## 2017-02-04 MED ORDER — LISINOPRIL 20 MG PO TABS: 20.0000 mg | ORAL_TABLET | Freq: Every day | ORAL | 0 refills | Status: DC

## 2017-02-04 MED ORDER — ATORVASTATIN CALCIUM 80 MG PO TABS: 80.0000 mg | ORAL_TABLET | Freq: Every day | ORAL | 0 refills | Status: DC

## 2017-02-04 MED ORDER — NITROGLYCERIN 0.4 MG SL SUBL: 0.4000 mg | SUBLINGUAL_TABLET | SUBLINGUAL | 0 refills | Status: DC | PRN

## 2017-02-04 MED ORDER — SODIUM CHLORIDE 0.9% FLUSH
3.0000 mL | Freq: Two times a day (BID) | INTRAVENOUS | Status: DC
  Administered 2017-02-04: 3 mL via INTRAVENOUS

## 2017-02-04 NOTE — Progress Notes (Signed)
Discharge instructions explained to pt and pts family/ verbalized an understanding/ iv and tele removed/ will transport off unit via wheelchair/ care mang assisted pt with obtaining medications

## 2017-02-04 NOTE — Care Management (Signed)
Patient is for discharge home today.  [facility].  Patient to proceed to clinic at discharge to obtain.  Discussed the need to complete the application process.  There are no refills on the medications. Instructed that clinic is closed for lunch 12:30 - 1:30P

## 2017-02-04 NOTE — Discharge Summary (Signed)
Bryce [name], is a 51 y.o. male  DOB 12/08/1965  MRN [id number].  Admission date:  02/02/2017  Admitting Physician  Alwyn Pea, MD  Discharge Date:  02/04/2017   Primary MD  Patient, No Pcp Per  Recommendations for primary care physician for things to follow:   Follow-up with Dr. Juliann Pares in 1 week   Admission Diagnosis  ST elevation myocardial infarction (STEMI), unspecified artery (HCC) [I21.3] STEMI (ST elevation myocardial infarction) (HCC) [I21.3]   Discharge Diagnosis  ST elevation myocardial infarction (STEMI), unspecified artery (HCC) [I21.3] STEMI (ST elevation myocardial infarction) (HCC) [I21.3]    Active Problems:   STEMI (ST elevation myocardial infarction) [facility])      Past Medical History:  Diagnosis Date  . Chronic systolic congestive heart failure (HCC)   . Coronary artery disease   . Hypertension   . Ischemic cardiomyopathy   . MI (myocardial infarction) [facility])     Past Surgical History:  Procedure Laterality Date  . CARDIAC CATHETERIZATION N/A 12/28/2014   Procedure: Left Heart Cath and Coronary Angiography;  Surgeon: Alwyn Pea, MD;  Location: [facility] INVASIVE CV LAB;  Service: Cardiovascular;  Laterality: N/A;  . CORONARY ANGIOPLASTY     Non-ST elevation MI status post stenting within bare-metal stent of the 75% lesion of the LAD, 11/13/2010.   . CORONARY STENT INTERVENTION N/A 02/02/2017   Procedure: CORONARY/GRAFT ACUTE MI REVASCULARIZATION;  Surgeon: Alwyn Pea, MD;  Location: [facility] INVASIVE CV LAB;  Service: Cardiovascular;  Laterality: N/A;  . LEFT HEART CATH AND CORONARY ANGIOGRAPHY N/A 02/02/2017   Procedure: LEFT HEART CATH AND CORONARY ANGIOGRAPHY;  Surgeon: Alwyn Pea, MD;  Location: [facility] INVASIVE CV LAB;  Service: Cardiovascular;  Laterality: N/A;        History of present illness and  Hospital Course:     Kindly see H&P for history of present illness and admission details, please review complete Labs, Consult reports and Test reports for all details in brief  HPI  from the history and physical done on the day of admission 8-year-old male patient with history of chlamydia artery disease status post MI and previous stent, essential hypertension came into ED because of jaw pain on the left side, left arm pain. [name] developed crushing chest pain, EKG showed ST elevation MI in lead 2, 3, ST depression in aVL,code STEMI called.   Hospital Course  #1 left-sided chest pain usually. Non-ST elevation MI: Patient is immediately taken to cardiac catheter,idea catheter showed 100% stenosis of distal circumflex status post drug-eluting stent. And after the stent patient has no chest pain and he is doing well today. Patient is monitored for 48 hours on telemetry for any cardiac arrhythmia-. Patient did not have any arrhythmias, he remained symptom-free. Today he is able to go home with aspirin, Plavix, statins, bblocker, ACE inhibitor .provided assistance with medicines.LDL is 119.Alsoreferred to phase II cardiac rehabilitation.Patient can follow-up with Dr.callwood in 1-2 weeks.restart outpatient sleep studies. At this time he has no insurance, he just started new job and he will get insurance in 90 days. #2 .chronic systolic heart failure stable. Echocardium showed EF 45-50%.  Discharge Condition: stable   Follow UP  Follow-up Information    Alwyn Pea, MD. Go on 02/11/2017.   Specialties:  Cardiology, Internal Medicine Why:  9:00AM Contact information: [address] [facility] - CARDIOLOGY [place] [address] [phone]             Discharge Instructions  and  Discharge Medications     Discharge Instructions    AMB Referral to Cardiac Rehabilitation - Phase II    Complete by:  As directed     Diagnosis:   STEMI Coronary Stents       Allergies as of 02/04/2017   No Known Allergies     Medication List    STOP taking these medications   cyclobenzaprine 10 MG tablet Commonly known as:  FLEXERIL   diazepam 5 MG tablet Commonly known as:  VALIUM   HYDROcodone-acetaminophen 5-325 MG tablet Commonly known as:  NORCO/VICODIN   ibuprofen 600 MG tablet Commonly known as:  ADVIL,MOTRIN   pravastatin 40 MG tablet Commonly known as:  PRAVACHOL   predniSONE 10 MG tablet Commonly known as:  DELTASONE   traMADol 50 MG tablet Commonly known as:  ULTRAM     TAKE these medications   aspirin 81 MG EC tablet Take 1 tablet (81 mg total) by mouth daily.   atorvastatin 80 MG tablet Commonly known as:  LIPITOR Take 1 tablet (80 mg total) by mouth daily at 6 PM.   clopidogrel 75 MG tablet Commonly known as:  PLAVIX Take 1 tablet (75 mg total) by mouth daily.   lisinopril 20 MG tablet Commonly known as:  PRINIVIL,ZESTRIL Take 1 tablet (20 mg total) by mouth daily.   metoprolol tartrate 50 MG tablet Commonly known as:  LOPRESSOR Take 1 tablet (50 mg total) by mouth 2 (two) times daily. What changed:  medication strength  how much to take   nitroGLYCERIN 0.4 MG SL tablet Commonly known as:  NITROSTAT Place 1 tablet (0.4 mg total) under the tongue every 5 (five) minutes x 3 doses as needed for chest pain.   pantoprazole 40 MG tablet Commonly known as:  PROTONIX Take 1 tablet (40 mg total) by mouth daily.            Discharge Care Instructions        Start     Ordered   02/05/17 0000  aspirin EC 81 MG EC tablet  Daily     02/04/17 1024   02/05/17 0000  clopidogrel (PLAVIX) 75 MG tablet  Daily     02/04/17 1024   02/05/17 0000  lisinopril (PRINIVIL,ZESTRIL) 20 MG tablet  Daily     02/04/17 1024   02/05/17 0000  pantoprazole (PROTONIX) 40 MG tablet  Daily     02/04/17 1024   02/04/17 0000  atorvastatin (LIPITOR) 80 MG tablet  Daily-1800     02/04/17 1024    02/04/17 0000  metoprolol tartrate (LOPRESSOR) 50 MG tablet  2 times daily     02/04/17 1024   02/04/17 0000  nitroGLYCERIN (NITROSTAT) 0.4 MG SL tablet  Every 5 min x3 PRN     02/04/17 1024   02/02/17 0000  AMB Referral to Cardiac Rehabilitation - Phase II    Question Answer Comment  Diagnosis: STEMI   Diagnosis: Coronary Stents      02/02/17 1122        Diet and Activity recommendation: See Discharge Instructions above   Consults obtained -cardiology,casemanager   Major procedures and Radiology Reports - PLEASE review detailed and final reports for all details, in brief -      Dg Chest 2 View  Result Date: 02/02/2017 CLINICAL DATA:  Sharp and constant chest pain beginning at 4 a.m. today. Pain extends to the jaw and left upper extremity. EXAM: CHEST  2 VIEW COMPARISON:  Two-view chest x-ray 12/16/2016.  FINDINGS: The heart is enlarged. Lung volumes are low. Mild interstitial prominence is similar the prior exam. No airspace consolidation is present. There are no significant effusions. Multilevel degenerative changes of the thoracic spine are stable. IMPRESSION: 1. Stable cardiomegaly and mild interstitial prominence without frank edema. 2. No focal airspace disease. Electronically Signed   By: Marin Roberts M.D.   On: 02/02/2017 07:58    Micro Results     Recent Results (from the past 240 hour(s))  MRSA PCR Screening     Status: None   Collection Time: 02/02/17 12:08 PM  Result Value Ref Range Status   MRSA by PCR NEGATIVE NEGATIVE Final    Comment:        The GeneXpert MRSA Assay (FDA approved for NASAL specimens only), is one component of a comprehensive MRSA colonization surveillance program. It is not intended to diagnose MRSA infection nor to guide or monitor treatment for MRSA infections.        Today   Subjective:   Bryce [name] today has no headache,no chest abdominal pain,no new weakness tingling or numbness, feels much better wants to  go home today.   Objective:   Blood pressure 125/75, pulse 73, temperature 98.1 F (36.7 C), temperature source Oral, resp. rate 18, height 5\' 8"  (1.727 m), weight 113.6 kg (250 lb 7.1 oz), SpO2 98 %.   Intake/Output Summary (Last 24 hours) at 02/04/17 1359 Last data filed at 02/04/17 1353  Gross per 24 hour  Intake          2240.92 ml  Output             1775 ml  Net           465.92 ml    Exam Awake Alert, Oriented x 3, No new F.N deficits, Normal affect [place].AT,PERRAL Supple Neck,No JVD, No cervical lymphadenopathy appriciated.  Symmetrical Chest wall movement, Good air movement bilaterally, CTAB RRR,No Gallops,Rubs or new Murmurs, No Parasternal Heave +ve B.Sounds, Abd Soft, Non tender, No organomegaly appriciated, No rebound -guarding or rigidity. No Cyanosis, Clubbing or edema, No new Rash or bruise  Data Review   CBC w Diff: Lab Results  Component Value Date   WBC 6.6 02/03/2017   HGB 12.5 (L) 02/03/2017   HGB 14.2 06/28/2014   HCT 36.7 (L) 02/03/2017   HCT 42.9 06/28/2014   PLT 177 02/03/2017   PLT 209 06/28/2014   LYMPHOPCT 22 12/15/2015   LYMPHOPCT 27.0 06/28/2014   MONOPCT 11 12/15/2015   MONOPCT 8.8 06/28/2014   EOSPCT 1 12/15/2015   EOSPCT 1.0 06/28/2014   BASOPCT 1 12/15/2015   BASOPCT 0.7 06/28/2014    CMP: Lab Results  Component Value Date   NA 138 02/03/2017   NA 140 06/28/2014   K 4.1 02/03/2017   K 4.5 06/28/2014   CL 107 02/03/2017   CL 109 (H) 06/28/2014   CO2 27 02/03/2017   CO2 27 06/28/2014   BUN 15 02/03/2017   BUN 11 06/28/2014   CREATININE 0.96 02/03/2017   CREATININE 0.90 06/28/2014   PROT 8.3 (H) 12/22/2014   PROT 6.8 06/28/2014   ALBUMIN 4.6 12/22/2014   ALBUMIN 3.6 06/28/2014   BILITOT 0.8 12/22/2014   BILITOT 0.5 06/28/2014   ALKPHOS 75 12/22/2014   ALKPHOS 67 06/28/2014   AST 25 12/22/2014   AST 29 06/28/2014   ALT 35 12/22/2014   ALT 34 06/28/2014  .   Total Time in preparing paper work, data evaluation and  todays  exam - 35 minutes  Jac Romulus M.D on 02/04/2017 at 1:59 PM    Note: This dictation was prepared with Dragon dictation along with smaller phrase technology. Any transcriptional errors that result from this process are unintentional.

## 2017-02-04 NOTE — Progress Notes (Signed)
CH visit with patient family. Offered prayer and emotional support. Patient was on a phone call so CH will come back at a later time.Marland Kitchen.   02/04/17 1000  Clinical Encounter Type  Visited With Patient;Family;Patient and family together  Visit Type Initial;Spiritual support  Referral From Nurse  Spiritual Encounters  Spiritual Needs Literature;Emotional

## 2017-02-07 ENCOUNTER — Telehealth: Payer: Self-pay | Admitting: Pharmacy Technician

## 2017-02-07 NOTE — Telephone Encounter (Signed)
[facility]  Bryce DacostaBetty J. [name] Care Manager [facility]

## 2017-02-12 ENCOUNTER — Ambulatory Visit: Payer: Self-pay | Admitting: Pharmacy Technician

## 2017-02-12 DIAGNOSIS — Z79899 Other long term (current) drug therapy: Secondary | ICD-10-CM

## 2017-02-12 NOTE — Progress Notes (Signed)
Met with patient completed financial assistance application for  due to recent ED visit.  Patient agreed to be responsible for gathering financial information and forwarding to appropriate department in [facility].    Completed [facility] application and contract.  Patient agreed to all terms of the [facility] contract.  Patient approved to receive medication assistance at [facility] through 2018, as long as eligibility criteria continues to be met.  Provided patient with Civil engineer, contracting based on his particular needs.    Referred to [facility]  Referred for MTM  [facility]

## 2017-02-20 ENCOUNTER — Encounter: Payer: Self-pay | Admitting: Emergency Medicine

## 2017-02-20 ENCOUNTER — Observation Stay
Admission: EM | Admit: 2017-02-20 | Discharge: 2017-02-22 | Disposition: A | Discharge status: Home / Self Care | Payer: Self-pay | Attending: Internal Medicine | Admitting: Internal Medicine

## 2017-02-20 DIAGNOSIS — I2510 Atherosclerotic heart disease of native coronary artery without angina pectoris: Secondary | ICD-10-CM | POA: Diagnosis present

## 2017-02-20 DIAGNOSIS — Z7902 Long term (current) use of antithrombotics/antiplatelets: Secondary | ICD-10-CM | POA: Insufficient documentation

## 2017-02-20 DIAGNOSIS — R911 Solitary pulmonary nodule: Secondary | ICD-10-CM | POA: Insufficient documentation

## 2017-02-20 DIAGNOSIS — E860 Dehydration: Secondary | ICD-10-CM | POA: Diagnosis present

## 2017-02-20 DIAGNOSIS — I252 Old myocardial infarction: Secondary | ICD-10-CM | POA: Insufficient documentation

## 2017-02-20 DIAGNOSIS — Z955 Presence of coronary angioplasty implant and graft: Secondary | ICD-10-CM | POA: Insufficient documentation

## 2017-02-20 DIAGNOSIS — R7301 Impaired fasting glucose: Secondary | ICD-10-CM | POA: Insufficient documentation

## 2017-02-20 DIAGNOSIS — X58XXXA Exposure to other specified factors, initial encounter: Secondary | ICD-10-CM | POA: Insufficient documentation

## 2017-02-20 DIAGNOSIS — N179 Acute kidney failure, unspecified: Secondary | ICD-10-CM | POA: Diagnosis present

## 2017-02-20 DIAGNOSIS — Y93H3 Activity, building and construction: Secondary | ICD-10-CM | POA: Insufficient documentation

## 2017-02-20 DIAGNOSIS — Z7982 Long term (current) use of aspirin: Secondary | ICD-10-CM | POA: Insufficient documentation

## 2017-02-20 DIAGNOSIS — I110 Hypertensive heart disease with heart failure: Secondary | ICD-10-CM | POA: Insufficient documentation

## 2017-02-20 DIAGNOSIS — M6282 Rhabdomyolysis: Principal | ICD-10-CM | POA: Insufficient documentation

## 2017-02-20 DIAGNOSIS — I5022 Chronic systolic (congestive) heart failure: Secondary | ICD-10-CM | POA: Insufficient documentation

## 2017-02-20 DIAGNOSIS — T675XXA Heat exhaustion, unspecified, initial encounter: Secondary | ICD-10-CM | POA: Insufficient documentation

## 2017-02-20 DIAGNOSIS — Y990 Civilian activity done for income or pay: Secondary | ICD-10-CM | POA: Insufficient documentation

## 2017-02-20 DIAGNOSIS — I10 Essential (primary) hypertension: Secondary | ICD-10-CM | POA: Diagnosis present

## 2017-02-20 DIAGNOSIS — Z79899 Other long term (current) drug therapy: Secondary | ICD-10-CM | POA: Insufficient documentation

## 2017-02-20 DIAGNOSIS — Z8249 Family history of ischemic heart disease and other diseases of the circulatory system: Secondary | ICD-10-CM | POA: Insufficient documentation

## 2017-02-20 DIAGNOSIS — E785 Hyperlipidemia, unspecified: Secondary | ICD-10-CM | POA: Diagnosis present

## 2017-02-20 DIAGNOSIS — F17210 Nicotine dependence, cigarettes, uncomplicated: Secondary | ICD-10-CM | POA: Insufficient documentation

## 2017-02-20 DIAGNOSIS — I255 Ischemic cardiomyopathy: Secondary | ICD-10-CM | POA: Insufficient documentation

## 2017-02-20 LAB — CBC
HEMATOCRIT: 42.9 % (ref 40.0–52.0)
HEMOGLOBIN: 14.9 g/dL (ref 13.0–18.0)
MCH: 32.8 pg (ref 26.0–34.0)
MCHC: 34.6 g/dL (ref 32.0–36.0)
MCV: 94.8 fL (ref 80.0–100.0)
Platelets: 284 10*3/uL (ref 150–440)
RBC: 4.53 MIL/uL (ref 4.40–5.90)
RDW: 14.4 % (ref 11.5–14.5)
WBC: 12.7 10*3/uL — ABNORMAL HIGH (ref 3.8–10.6)

## 2017-02-20 LAB — URINALYSIS, COMPLETE (UACMP) WITH MICROSCOPIC
GLUCOSE, UA: NEGATIVE mg/dL
KETONES UR: 5 mg/dL — AB
Nitrite: NEGATIVE
PH: 5 (ref 5.0–8.0)
Protein, ur: 100 mg/dL — AB
SPECIFIC GRAVITY, URINE: 1.024 (ref 1.005–1.030)

## 2017-02-20 LAB — BASIC METABOLIC PANEL
Anion gap: 15 (ref 5–15)
BUN: 35 mg/dL — AB (ref 6–20)
CALCIUM: 10.2 mg/dL (ref 8.9–10.3)
CHLORIDE: 98 mmol/L — AB (ref 101–111)
CO2: 21 mmol/L — AB (ref 22–32)
CREATININE: 3.21 mg/dL — AB (ref 0.61–1.24)
GFR calc Af Amer: 24 mL/min — ABNORMAL LOW (ref >60)
GFR calc non Af Amer: 21 mL/min — ABNORMAL LOW (ref >60)
GLUCOSE: 119 mg/dL — AB (ref 65–99)
Potassium: 4 mmol/L (ref 3.5–5.1)
Sodium: 134 mmol/L — ABNORMAL LOW (ref 135–145)

## 2017-02-20 LAB — CK: CK TOTAL: 751 U/L — AB (ref 49–397)

## 2017-02-20 MED ORDER — METOPROLOL TARTRATE 50 MG PO TABS
50.0000 mg | ORAL_TABLET | Freq: Two times a day (BID) | ORAL | Status: DC
  Administered 2017-02-21: 50 mg via ORAL
  Filled 2017-02-20 (×2): qty 1

## 2017-02-20 MED ORDER — CLOPIDOGREL BISULFATE 75 MG PO TABS
75.0000 mg | ORAL_TABLET | Freq: Every day | ORAL | Status: DC
  Administered 2017-02-21 – 2017-02-22 (×2): 75 mg via ORAL
  Filled 2017-02-20 (×2): qty 1

## 2017-02-20 MED ORDER — PANTOPRAZOLE SODIUM 40 MG PO TBEC
40.0000 mg | DELAYED_RELEASE_TABLET | Freq: Every day | ORAL | Status: DC
  Administered 2017-02-21 – 2017-02-22 (×2): 40 mg via ORAL
  Filled 2017-02-20 (×2): qty 1

## 2017-02-20 MED ORDER — ONDANSETRON HCL 4 MG/2ML IJ SOLN
4.0000 mg | Freq: Four times a day (QID) | INTRAMUSCULAR | Status: DC | PRN
Start: 2017-02-20 — End: 2017-02-22

## 2017-02-20 MED ORDER — SODIUM CHLORIDE 0.9 % IV BOLUS (SEPSIS)
1000.0000 mL | Freq: Once | INTRAVENOUS | Status: AC
  Administered 2017-02-20: 1000 mL via INTRAVENOUS

## 2017-02-20 MED ORDER — ACETAMINOPHEN 650 MG RE SUPP: 650.0000 mg | Freq: Four times a day (QID) | RECTAL | Status: DC | PRN

## 2017-02-20 MED ORDER — ACETAMINOPHEN 325 MG PO TABS: 650.0000 mg | ORAL_TABLET | Freq: Four times a day (QID) | ORAL | Status: DC | PRN

## 2017-02-20 MED ORDER — HEPARIN SODIUM (PORCINE) 5000 UNIT/ML IJ SOLN
5000.0000 [IU] | Freq: Three times a day (TID) | INTRAMUSCULAR | Status: DC
  Administered 2017-02-21 – 2017-02-22 (×4): 5000 [IU] via SUBCUTANEOUS
  Filled 2017-02-20 (×4): qty 1

## 2017-02-20 MED ORDER — SODIUM CHLORIDE 0.9 % IV SOLN
INTRAVENOUS | Status: AC
  Administered 2017-02-21: 01:00:00 via INTRAVENOUS

## 2017-02-20 MED ORDER — ATORVASTATIN CALCIUM 20 MG PO TABS: 80.0000 mg | ORAL_TABLET | Freq: Every day | ORAL | Status: DC

## 2017-02-20 MED ORDER — LISINOPRIL 20 MG PO TABS
20.0000 mg | ORAL_TABLET | Freq: Every day | ORAL | Status: DC
  Filled 2017-02-20: qty 1

## 2017-02-20 MED ORDER — ASPIRIN EC 81 MG PO TBEC
81.0000 mg | DELAYED_RELEASE_TABLET | Freq: Every day | ORAL | Status: DC
  Administered 2017-02-21 – 2017-02-22 (×2): 81 mg via ORAL
  Filled 2017-02-20 (×2): qty 1

## 2017-02-20 MED ORDER — ONDANSETRON HCL 4 MG PO TABS: 4.0000 mg | ORAL_TABLET | Freq: Four times a day (QID) | ORAL | Status: DC | PRN

## 2017-02-20 NOTE — Progress Notes (Signed)
Subjective:  Pain free. No groin issues. Denies SOB. Tolerating meds well. Ready to go home today.  Objective:  Vital Signs in the last 24 hours: Temp:  [98 F (36.7 C)] 98 F (36.7 C) (09/20 2033) Pulse Rate:  [69] 69 (09/20 2033) Resp:  [18] 18 (09/20 2033) BP: (120)/(98) 120/98 (09/20 2033) SpO2:  [97 %] 97 % (09/20 2033) Weight:  [250 lb (113.4 kg)] 250 lb (113.4 kg) (09/20 2034)  Intake/Output from previous day: No intake/output data recorded. Intake/Output from this shift: No intake/output data recorded.  Physical Exam: General appearance: appears stated age  Neck supple no JVD Lungs clear to a/p Heart RRR with no MGR Abd benign  Ext WNL  Lab Results: No results for input(s): WBC, HGB, PLT in the last 72 hours. No results for input(s): NA, K, CL, CO2, GLUCOSE, BUN, CREATININE in the last 72 hours. No results for input(s): TROPONINI in the last 72 hours.  Invalid input(s): CK, MB Hepatic Function Panel No results for input(s): PROT, ALBUMIN, AST, ALT, ALKPHOS, BILITOT, BILIDIR, IBILI in the last 72 hours. No results for input(s): CHOL in the last 72 hours. No results for input(s): PROTIME in the last 72 hours.  Imaging: Imaging results have been reviewed  Cardiac Studies:  Assessment/Plan: S/P STEMI post op day2 CAD HTN  Angina Chest Pain Coronary Artery Disease Coronary Artery Stent Shortness of Breath  . PLAN Agree with Bryce/c home today Continue Plavix and ASA Agree with Bp control Rec sleep study for possible OSA Continue lipitor for hyper lipidemia F/U with AP as outpt 1-2 weeks  LOS: 2 days    Bryce [name] Bryce [name] 02/20/2017, 8:37 PM

## 2017-02-20 NOTE — ED Notes (Signed)
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 Pt states he has been drinking a lot of water and pedialyte due to heat. Pt states today he drank "9 bottles of water and 32oz of pedialyte today and it hasn't helped the cramps." Pt states the cramps are all over his body including hands, legs, bilateral sides. Pt denies CP or SHOB. Pt has hx of MI 3 wks ago with stent placement, pt states he returned to work this Monday. Pt A&O and in NAD at this time. Pt on monitor.

## 2017-02-20 NOTE — ED Provider Notes (Signed)
[facility] Emergency Department Provider Note  ____________________________________________   First MD Initiated Contact with Patient 02/20/17 2132     (approximate)  I have reviewed the triage vital signs and the nursing notes.   HISTORY  Chief Complaint No chief complaint on file.   HPI Bryce [name] is a 51 y.o. male with a history of coronary artery disease with recent myocardial infarction with stenting to several weeks ago who is presenting to the emergency department with diffuse body aches. 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 He says that he was out in the heat despite drinking plenty of water and Pedialyte he is still having cramping to his bilateral upper and lower extremities. He says the cramping has increased throughout the week despite him being hydrated and drinking Pedialyte. He says that he has similar situation that happened last summer where he needed to be admitted to the hospital for renal failure. He is denying any chest pain or shortness of breath.   Past Medical History:  Diagnosis Date  . Chronic systolic congestive heart failure (HCC)   . Coronary artery disease   . Hypertension   . Ischemic cardiomyopathy   . MI (myocardial infarction) [facility])     Patient Active Problem List   Diagnosis Date Noted  . CAD (coronary artery disease) 02/20/2017  . STEMI (ST elevation myocardial infarction) (HCC) 02/02/2017  . AKI (acute kidney injury) (HCC) 12/15/2015  . Solitary pulmonary nodule 12/23/2014  . Chest pain 12/22/2014  . Essential hypertension 12/22/2014  . Hyperlipidemia 12/22/2014    Past Surgical History:  Procedure Laterality Date  . CARDIAC CATHETERIZATION N/A 12/28/2014   Procedure: Left Heart Cath and Coronary Angiography;  Surgeon: Alwyn Pea, MD;  Location: [facility] INVASIVE CV LAB;  Service: Cardiovascular;  Laterality: N/A;  . CORONARY ANGIOPLASTY      Non-ST elevation MI status post stenting within bare-metal stent of the 75% lesion of the LAD, 11/13/2010.   . CORONARY STENT INTERVENTION N/A 02/02/2017   Procedure: CORONARY/GRAFT ACUTE MI REVASCULARIZATION;  Surgeon: Alwyn Pea, MD;  Location: [facility] INVASIVE CV LAB;  Service: Cardiovascular;  Laterality: N/A;  . LEFT HEART CATH AND CORONARY ANGIOGRAPHY N/A 02/02/2017   Procedure: LEFT HEART CATH AND CORONARY ANGIOGRAPHY;  Surgeon: Alwyn Pea, MD;  Location: [facility] INVASIVE CV LAB;  Service: Cardiovascular;  Laterality: N/A;    Prior to Admission medications   Medication Sig Start Date End Date Taking? Authorizing Provider  aspirin EC 81 MG EC tablet Take 1 tablet (81 mg total) by mouth daily. 02/05/17   Katha Hamming, MD  atorvastatin (LIPITOR) 80 MG tablet Take 1 tablet (80 mg total) by mouth daily at 6 PM. 02/04/17   Katha Hamming, MD  clopidogrel (PLAVIX) 75 MG tablet Take 1 tablet (75 mg total) by mouth daily. 02/05/17   Katha Hamming, MD  lisinopril (PRINIVIL,ZESTRIL) 20 MG tablet Take 1 tablet (20 mg total) by mouth daily. 02/05/17   Katha Hamming, MD  metoprolol tartrate (LOPRESSOR) 50 MG tablet Take 1 tablet (50 mg total) by mouth 2 (two) times daily. 02/04/17   Katha Hamming, MD  nitroGLYCERIN (NITROSTAT) 0.4 MG SL tablet Place 1 tablet (0.4 mg total) under the tongue every 5 (five) minutes x 3 doses as needed for chest pain. 02/04/17   Katha Hamming, MD  pantoprazole (PROTONIX) 40 MG tablet Take 1 tablet (40 mg total) by mouth daily.  02/05/17   Katha Hamming, MD    Allergies Patient has no known allergies.  Family History  Problem Relation Age of Onset  . CAD Father     Social History Social History  Substance Use Topics  . Smoking status: Current Every Day Smoker    Packs/day: 1.00    Types: Cigarettes  . Smokeless tobacco: Never Used  . Alcohol use 0.6 oz/week    1 Cans of beer per week     Comment: "every now and then"      Review of Systems  Constitutional: No fever/chills Eyes: No visual changes. ENT: No sore throat. Cardiovascular: Denies chest pain. Respiratory: Denies shortness of breath. Gastrointestinal: No abdominal pain.  No nausea, no vomiting.  No diarrhea.  No constipation. Genitourinary: Negative for dysuria. Musculoskeletal: Negative for back pain. Skin: Negative for rash. Neurological: Negative for headaches, focal weakness or numbness.   ____________________________________________   PHYSICAL EXAM:  VITAL SIGNS: ED Triage Vitals  Enc Vitals Group     BP 02/20/17 2033 (!) 120/98     Pulse Rate 02/20/17 2033 69     Resp 02/20/17 2033 18     Temp 02/20/17 2033 98 F (36.7 C)     Temp Source 02/20/17 2033 Oral     SpO2 02/20/17 2033 97 %     Weight 02/20/17 2034 250 lb (113.4 kg)     Height 02/20/17 2034  (1.727 m)     Head Circumference --      Peak Flow --      Pain Score 02/20/17 2034 8     Pain Loc --      Pain Edu? --      Excl. in GC? --     Constitutional: Alert and oriented. Well appearing and in no acute distress. Eyes: Conjunctivae are normal.  Head: Atraumatic. Nose: No congestion/rhinnorhea. Mouth/Throat: Mucous membranes are moist.  Neck: No stridor.   Cardiovascular: Normal rate, regular rhythm. Grossly normal heart sounds.   Respiratory: Normal respiratory effort.  No retractions. Lungs CTAB. Gastrointestinal: Soft and nontender. No distention.  Musculoskeletal: No lower extremity tenderness nor edema.  No joint effusions. Neurologic:  Normal speech and language. No gross focal neurologic deficits are appreciated. Skin:  Skin is warm, dry and intact. No rash noted. Psychiatric: Mood and affect are normal. Speech and behavior are normal.  ____________________________________________   LABS (all labs ordered are listed, but only abnormal results are displayed)  Labs Reviewed  CBC - Abnormal; Notable for the following:       Result Value    WBC 12.7 (*)    All other components within normal limits  CK - Abnormal; Notable for the following:    Total CK 751 (*)    All other components within normal limits  URINALYSIS, COMPLETE (UACMP) WITH MICROSCOPIC - Abnormal; Notable for the following:    Color, Urine AMBER (*)    APPearance CLOUDY (*)    Hgb urine dipstick LARGE (*)    Bilirubin Urine SMALL (*)    Ketones, ur 5 (*)    Protein, ur 100 (*)    Leukocytes, UA TRACE (*)    Bacteria, UA RARE (*)    Squamous Epithelial / LPF 0-5 (*)    All other components within normal limits  BASIC METABOLIC PANEL - Abnormal; Notable for the following:    Sodium 134 (*)    Chloride 98 (*)    CO2 21 (*)    Glucose, Bld 119 (*)  BUN 35 (*)    Creatinine, Ser 3.21 (*)    GFR calc non Af Amer 21 (*)    GFR calc Af Amer 24 (*)    All other components within normal limits   ____________________________________________  EKG   ____________________________________________  RADIOLOGY   ____________________________________________   PROCEDURES  Procedure(s) performed:   Procedures  Critical Care performed:   ____________________________________________   INITIAL IMPRESSION / ASSESSMENT AND PLAN / ED COURSE  Pertinent labs & imaging results that were available during my care of the patient were reviewed by me and considered in my medical decision making (see chart for details). DDX: rhabdomyolysis, kidney failure, muscle strain ----------------------------------------- 10:12 PM on 02/20/2017 -----------------------------------------  Patient with evident acute renal failure on his lab results. He will be admitted to the hospital. We will initiate fluid resuscitation. He is understanding of the diagnosis and plan went to comply. Signed out to Dr. Anne Hahn.      ____________________________________________   FINAL CLINICAL IMPRESSION(S) / ED DIAGNOSES  rhabdomyolysis. Acute kidney failure.    NEW MEDICATIONS  STARTED DURING THIS VISIT:  New Prescriptions   No medications on file     Note:  This document was prepared using Dragon voice recognition software and may include unintentional dictation errors.     Myrna Blazer, MD 02/20/17 2213

## 2017-02-20 NOTE — H&P (Signed)
[facility] - [place] at [facility]   PATIENT NAME: Bryce [name]    MR#:  [id number]  DATE OF BIRTH:  27-Nov-1965  DATE OF ADMISSION:  02/20/2017  PRIMARY CARE PHYSICIAN: Virl Axe, MD   REQUESTING/REFERRING PHYSICIAN: Pershing Proud, MD  CHIEF COMPLAINT:  Muscle Cramps  HISTORY OF PRESENT ILLNESS:  Bryce [name]  is a 51 y.o. male who presents with Muscle cramps and fatigue. Patient recently had heart attack with stent placement, was cleared to go back to work and became significantly dehydrated today working outside. He came in to the ED with muscle cramps and fatigue. Here is found to have significant AK I. Hospitals were called for admission  PAST MEDICAL HISTORY:   Past Medical History:  Diagnosis Date  . Chronic systolic congestive heart failure (HCC)   . Coronary artery disease   . Hypertension   . Ischemic cardiomyopathy   . MI (myocardial infarction) (HCC)     PAST SURGICAL HISTORY:   Past Surgical History:  Procedure Laterality Date  . CARDIAC CATHETERIZATION N/A 12/28/2014   Procedure: Left Heart Cath and Coronary Angiography;  Surgeon: Alwyn Pea, MD;  Location: [facility] INVASIVE CV LAB;  Service: Cardiovascular;  Laterality: N/A;  . CORONARY ANGIOPLASTY     Non-ST elevation MI status post stenting within bare-metal stent of the 75% lesion of the LAD, 11/13/2010.   . CORONARY STENT INTERVENTION N/A 02/02/2017   Procedure: CORONARY/GRAFT ACUTE MI REVASCULARIZATION;  Surgeon: Alwyn Pea, MD;  Location: [facility] INVASIVE CV LAB;  Service: Cardiovascular;  Laterality: N/A;  . LEFT HEART CATH AND CORONARY ANGIOGRAPHY N/A 02/02/2017   Procedure: LEFT HEART CATH AND CORONARY ANGIOGRAPHY;  Surgeon: Alwyn Pea, MD;  Location: [facility] INVASIVE CV LAB;  Service: Cardiovascular;  Laterality: N/A;    SOCIAL HISTORY:   Social History  Substance Use Topics  . Smoking status: Current Every Day Smoker    Packs/day: 1.00    Types: Cigarettes   . Smokeless tobacco: Never Used  . Alcohol use 0.6 oz/week    1 Cans of beer per week     Comment: "every now and then"    FAMILY HISTORY:   Family History  Problem Relation Age of Onset  . CAD Father     DRUG ALLERGIES:  No Known Allergies  MEDICATIONS AT HOME:   Prior to Admission medications   Medication Sig Start Date End Date Taking? Authorizing Provider  aspirin EC 81 MG EC tablet Take 1 tablet (81 mg total) by mouth daily. 02/05/17   Katha Hamming, MD  atorvastatin (LIPITOR) 80 MG tablet Take 1 tablet (80 mg total) by mouth daily at 6 PM. 02/04/17   Katha Hamming, MD  clopidogrel (PLAVIX) 75 MG tablet Take 1 tablet (75 mg total) by mouth daily. 02/05/17   Katha Hamming, MD  lisinopril (PRINIVIL,ZESTRIL) 20 MG tablet Take 1 tablet (20 mg total) by mouth daily. 02/05/17   Katha Hamming, MD  metoprolol tartrate (LOPRESSOR) 50 MG tablet Take 1 tablet (50 mg total) by mouth 2 (two) times daily. 02/04/17   Katha Hamming, MD  nitroGLYCERIN (NITROSTAT) 0.4 MG SL tablet Place 1 tablet (0.4 mg total) under the tongue every 5 (five) minutes x 3 doses as needed for chest pain. 02/04/17   Katha Hamming, MD  pantoprazole (PROTONIX) 40 MG tablet Take 1 tablet (40 mg total) by mouth daily. 02/05/17   Katha Hamming, MD    REVIEW OF SYSTEMS:  Review of Systems  Constitutional: Positive for  malaise/fatigue. Negative for chills, fever and weight loss.  HENT: Negative for ear pain, hearing loss and tinnitus.   Eyes: Negative for blurred vision, double vision, pain and redness.  Respiratory: Negative for cough, hemoptysis and shortness of breath.   Cardiovascular: Negative for chest pain, palpitations, orthopnea and leg swelling.  Gastrointestinal: Negative for abdominal pain, constipation, diarrhea, nausea and vomiting.  Genitourinary: Negative for dysuria, frequency and hematuria.  Musculoskeletal: Positive for myalgias. Negative for back pain, joint pain  and neck pain.  Skin:       No acne, rash, or lesions  Neurological: Negative for dizziness, tremors, focal weakness and weakness.  Endo/Heme/Allergies: Negative for polydipsia. Does not bruise/bleed easily.  Psychiatric/Behavioral: Negative for depression. The patient is not nervous/anxious and does not have insomnia.      VITAL SIGNS:   Vitals:   02/20/17 2033 02/20/17 2034  BP: (!) 120/98   Pulse: 69   Resp: 18   Temp: 98 F (36.7 C)   TempSrc: Oral   SpO2: 97%   Weight:  113.4 kg (250 lb)  Height:   (1.727 m)   Wt Readings from Last 3 Encounters:  02/20/17 113.4 kg (250 lb)  02/02/17 113.6 kg (250 lb 7.1 oz)  12/16/16 111.1 kg (245 lb)    PHYSICAL EXAMINATION:  Physical Exam  Vitals reviewed. Constitutional: He is oriented to person, place, and time. He appears well-developed and well-nourished. No distress.  HENT:  Head: Normocephalic and atraumatic.  Mouth/Throat: Oropharynx is clear and moist.  Eyes: Pupils are equal, round, and reactive to light. Conjunctivae and EOM are normal. No scleral icterus.  Neck: Normal range of motion. Neck supple. No JVD present. No thyromegaly present.  Cardiovascular: Normal rate, regular rhythm and intact distal pulses.  Exam reveals no gallop and no friction rub.   No murmur heard. Respiratory: Effort normal and breath sounds normal. No respiratory distress. He has no wheezes. He has no rales.  GI: Soft. Bowel sounds are normal. He exhibits no distension. There is no tenderness.  Musculoskeletal: Normal range of motion. He exhibits tenderness (muscular). He exhibits no edema.  No arthritis, no gout  Lymphadenopathy:    He has no cervical adenopathy.  Neurological: He is alert and oriented to person, place, and time. No cranial nerve deficit.  No dysarthria, no aphasia  Skin: Skin is warm and dry. No rash noted. No erythema.  Psychiatric: He has a normal mood and affect. His behavior is normal. Judgment and thought content  normal.    LABORATORY PANEL:   CBC  Recent Labs Lab 02/20/17 2038  WBC 12.7*  HGB 14.9  HCT 42.9  PLT 284   ------------------------------------------------------------------------------------------------------------------  Chemistries   Recent Labs Lab 02/20/17 2038  NA 134*  K 4.0  CL 98*  CO2 21*  GLUCOSE 119*  BUN 35*  CREATININE 3.21*  CALCIUM 10.2   ------------------------------------------------------------------------------------------------------------------  Cardiac Enzymes No results for input(s): TROPONINI in the last 168 hours. ------------------------------------------------------------------------------------------------------------------  RADIOLOGY:  No results found.  EKG:   Orders placed or performed during the hospital encounter of 02/02/17  . EKG 12-Lead  . EKG 12-Lead  . ED EKG within 10 minutes  . ED EKG within 10 minutes  . ED EKG  . ED EKG  . EKG 12-Lead  . EKG 12-Lead  . EKG 12-Lead  . EKG 12-Lead  . EKG 12-Lead  . EKG    IMPRESSION AND PLAN:  Principal Problem:   AKI (acute kidney injury) (HCC) - IV  fluids tonight, with nephrotoxins, monitor for improvement Active Problems:   Dehydration - IV fluids   Essential hypertension - continue home meds   CAD (coronary artery disease) - home medications   Hyperlipidemia - home dose anti-lipids  All the records are reviewed and case discussed with ED provider. Management plans discussed with the patient and/or family.  DVT PROPHYLAXIS: SubQ heparin  GI PROPHYLAXIS: None  ADMISSION STATUS: Observation  CODE STATUS: Full Code Status History    Date Active Date Inactive Code Status Order ID Comments User Context   02/02/2017 12:12 PM 02/04/2017  5:22 PM Full Code [id number]  Ramonita Lab, MD Inpatient   02/02/2017 12:12 PM 02/02/2017 12:12 PM Full Code [id number]  Shaune Pollack, MD Inpatient   12/15/2015  6:51 PM 12/16/2015  2:08 PM Full Code [id number]  Hower, Cletis Athens, MD ED   12/28/2014   1:13 PM 12/28/2014  7:17 PM Full Code [id number]  Alwyn Pea, MD Inpatient   12/27/2014  1:47 PM 12/28/2014  1:13 PM Full Code [id number]  Milagros Loll, MD ED   12/22/2014 10:39 AM 12/23/2014  6:57 PM Full Code [id number]  Gale Journey, MD Inpatient      TOTAL TIME TAKING CARE OF THIS PATIENT: 40 minutes.   Anne Hahn, Tereasa Yilmaz FIELDING 02/20/2017, 10:29 PM  [facility]  Office  [phone]  CC: Primary care physician; Virl Axe, MD  Note:  This document was prepared using Dragon voice recognition software and may include unintentional dictation errors.

## 2017-02-20 NOTE — Progress Notes (Signed)
Subjective:  Doing wel no further chest pain. No SOB s/p PCI to Circ with DES.  Objective:  Vital Signs in the last 24 hours:    Intake/Output from previous day: No intake/output data recorded. Intake/Output from this shift: No intake/output data recorded.  Physical Exam: General appearance: appears stated age Neck: no adenopathy, no carotid bruit, no JVD, supple, symmetrical, trachea midline and thyroid not enlarged, symmetric, no tenderness/mass/nodules Lungs: clear to auscultation bilaterally Heart: regular rate and rhythm, S1, S2 normal, no murmur, click, rub or gallop Abdomen: soft, non-tender; bowel sounds normal; no masses,  no organomegaly Extremities: extremities normal, atraumatic, no cyanosis or edema Pulses: 2+ and symmetric Skin: Skin color, texture, turgor normal. No rashes or lesions Neurologic: Alert and oriented X 3, normal strength and tone. Normal symmetric reflexes. Normal coordination and gait  Lab Results: No results for input(s): WBC, HGB, PLT in the last 72 hours. No results for input(s): NA, K, CL, CO2, GLUCOSE, BUN, CREATININE in the last 72 hours. No results for input(s): TROPONINI in the last 72 hours.  Invalid input(s): CK, MB Hepatic Function Panel No results for input(s): PROT, ALBUMIN, AST, ALT, ALKPHOS, BILITOT, BILIDIR, IBILI in the last 72 hours. No results for input(s): CHOL in the last 72 hours. No results for input(s): PROTIME in the last 72 hours.  Imaging: Imaging results have been reviewed and No results found.  Cardiac Studies:  Assessment/Plan:  S/P STEMI IMI PCI to LCx with DES Coronary Artery Disease  Obesity Hypertension Smoking Hyperlipidemia Possible OSA . Plan Comtinue plavix and ASA Blockers post MI Consider ACE inhibitor Agree with Lipitor  daily to help with hyperlipidemia Bp control post MI Increase activity with ambulation in halls Transfer to tele Continue diet and exercise Hopefully d/c home  tomorrow  LOS: 2 days    Pinkney Venard D Nolie Bignell 02/20/2017, 8:21 PM

## 2017-02-20 NOTE — ED Notes (Signed)
Spoke with Dr Pershing Proud regarding pt's presenting c/o; verbal orders obtained and entered

## 2017-02-20 NOTE — ED Triage Notes (Addendum)
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

## 2017-02-21 LAB — BASIC METABOLIC PANEL
ANION GAP: 9 (ref 5–15)
BUN: 39 mg/dL — AB (ref 6–20)
CHLORIDE: 105 mmol/L (ref 101–111)
CO2: 24 mmol/L (ref 22–32)
Calcium: 8.7 mg/dL — ABNORMAL LOW (ref 8.9–10.3)
Creatinine, Ser: 2.41 mg/dL — ABNORMAL HIGH (ref 0.61–1.24)
GFR, EST AFRICAN AMERICAN: 34 mL/min — AB (ref >60)
GFR, EST NON AFRICAN AMERICAN: 29 mL/min — AB (ref >60)
Glucose, Bld: 128 mg/dL — ABNORMAL HIGH (ref 65–99)
POTASSIUM: 3.7 mmol/L (ref 3.5–5.1)
SODIUM: 138 mmol/L (ref 135–145)

## 2017-02-21 LAB — CBC
HEMATOCRIT: 37.3 % — AB (ref 40.0–52.0)
HEMOGLOBIN: 12.9 g/dL — AB (ref 13.0–18.0)
MCH: 33.1 pg (ref 26.0–34.0)
MCHC: 34.6 g/dL (ref 32.0–36.0)
MCV: 95.8 fL (ref 80.0–100.0)
Platelets: 218 10*3/uL (ref 150–440)
RBC: 3.89 MIL/uL — AB (ref 4.40–5.90)
RDW: 14.2 % (ref 11.5–14.5)
WBC: 5.7 10*3/uL (ref 3.8–10.6)

## 2017-02-21 LAB — CK: Total CK: 415 U/L — ABNORMAL HIGH (ref 49–397)

## 2017-02-21 MED ORDER — CYCLOBENZAPRINE HCL 10 MG PO TABS
5.0000 mg | ORAL_TABLET | Freq: Three times a day (TID) | ORAL | Status: DC | PRN
  Administered 2017-02-21 (×3): 5 mg via ORAL
  Filled 2017-02-21 (×3): qty 1

## 2017-02-21 MED ORDER — HYDROCODONE-ACETAMINOPHEN 5-325 MG PO TABS
1.0000 | ORAL_TABLET | Freq: Four times a day (QID) | ORAL | Status: DC | PRN
  Administered 2017-02-21: 1 via ORAL
  Filled 2017-02-21: qty 1

## 2017-02-21 NOTE — Care Management (Signed)
Patient has completed application process for [facility] but unable to find out if this also includes [facility] . He obtained his medications last discharge from [facility].  he is working.  At present, do not anticipate discharge on any cost prohibitive meds. Will have weekend CM to monitor

## 2017-02-21 NOTE — Progress Notes (Signed)
Patient ID: Bryce [name], male   DOB: Aug 13, 1965, 51 y.o.   MRN: [id number]  [facility] PROGRESS NOTE  FRUTOSO DIMARE WUJ:[id number] DOB: 1966/04/03 DOA: 02/20/2017 PCP: System, Pcp Not In  HPI/Subjective: Patient still feeling cramping throughout his body and weakness. States he went to work and is been sweating out in the heat. He's been drinking Pedialyte which helped out with his cramps a couple days ago. 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 He recently had a myocardial infarction and cardiac catheterization. He was recently started on metoprolol and lisinopril.  Objective: Vitals:   02/21/17 0955 02/21/17 1350  BP: (!) 93/58 110/63  Pulse: (!) 57 61  Resp: 18 17  Temp: 98.3 F (36.8 C) 97.8 F (36.6 C)  SpO2: 97% 98%    Filed Weights   02/20/17 2034  Weight: 113.4 kg (250 lb)    ROS: Review of Systems  Constitutional: Negative for chills and fever.  Eyes: Negative for blurred vision.  Respiratory: Negative for cough and shortness of breath.   Cardiovascular: Negative for chest pain.  Gastrointestinal: Negative for abdominal pain, constipation, diarrhea, nausea and vomiting.  Genitourinary: Negative for dysuria.  Musculoskeletal: Positive for joint pain and myalgias.  Neurological: Negative for dizziness and headaches.   Exam: Physical Exam  Constitutional: He is oriented to person, place, and time.  HENT:  Nose: No mucosal edema.  Mouth/Throat: No oropharyngeal exudate or posterior oropharyngeal edema.  Eyes: Pupils are equal, round, and reactive to light. Conjunctivae, EOM and lids are normal.  Neck: No JVD present. Carotid bruit is not present. No edema present. No thyroid mass and no thyromegaly present.  Cardiovascular: S1 normal and S2 normal.  Exam reveals no gallop.   No murmur heard. Pulses:      Dorsalis pedis pulses are 2+ on the right side, and 2+ on the left side.  Respiratory: No respiratory  distress. He has no wheezes. He has no rhonchi. He has no rales.  GI: Soft. Bowel sounds are normal. There is no tenderness.  Musculoskeletal:       Right ankle: He exhibits no swelling.       Left ankle: He exhibits no swelling.  Lymphadenopathy:    He has no cervical adenopathy.  Neurological: He is alert and oriented to person, place, and time. No cranial nerve deficit.  Skin: Skin is warm. No rash noted. Nails show no clubbing.  Psychiatric: He has a normal mood and affect.      Data Reviewed: Basic Metabolic Panel:  Recent Labs Lab 02/20/17 2038 02/21/17 0554  NA 134* 138  K 4.0 3.7  CL 98* 105  CO2 21* 24  GLUCOSE 119* 128*  BUN 35* 39*  CREATININE 3.21* 2.41*  CALCIUM 10.2 8.7*   CBC:  Recent Labs Lab 02/20/17 2038 02/21/17 0554  WBC 12.7* 5.7  HGB 14.9 12.9*  HCT 42.9 37.3*  MCV 94.8 95.8  PLT 284 218   Cardiac Enzymes:  Recent Labs Lab 02/20/17 2038 02/21/17 0554  CKTOTAL 751* 415*    Scheduled Meds: . aspirin EC  81 mg Oral Daily  . clopidogrel  75 mg Oral Daily  . heparin  5,000 Units Subcutaneous Q8H  . pantoprazole  40 mg Oral Daily    Assessment/Plan:  1. Acute kidney injury with dehydration. Likely combination of recent cardiac cath, recently started on lisinopril, dehydration and hypotension. Continue IV fluid hydration. Creatinine has improved from 3.21 to  2.41.  Continue to monitor creatinine on a daily basis. 2. Mild rhabdomyolysis secondary to statin and dehydration. Patient with muscle aches and pains. CPK trending better from 751 down to 4:15. Check another CPK tomorrow morning. Continue to hold statin. 3. Relative hypotension. Hold metoprolol and lisinopril. 4. Recent myocardial infarction with stent continue aspirin and Plavix. Blood pressure too low for antihypertensive medications currently. 5. Hyperlipidemia unspecified hold statin at this point. 6. Impaired fasting glucose. Check a hemoglobin A1c  Code Status:     Code  Status Orders        Start     Ordered   02/20/17 2332  Full code  Continuous     02/20/17 2331    Code Status History    Date Active Date Inactive Code Status Order ID Comments User Context   02/02/2017 12:12 PM 02/04/2017  5:22 PM Full Code [id number]  Ramonita Lab, MD Inpatient   02/02/2017 12:12 PM 02/02/2017 12:12 PM Full Code [id number]  Shaune Pollack, MD Inpatient   12/15/2015  6:51 PM 12/16/2015  2:08 PM Full Code [id number]  Hower, Cletis Athens, MD ED   12/28/2014  1:13 PM 12/28/2014  7:17 PM Full Code [id number]  Alwyn Pea, MD Inpatient   12/27/2014  1:47 PM 12/28/2014  1:13 PM Full Code [id number]  Milagros Loll, MD ED   12/22/2014 10:39 AM 12/23/2014  6:57 PM Full Code [id number]  Gale Journey, MD Inpatient     Disposition Plan: Evaluate daily based on tomorrow's creatinine and CPK on whether not the patient can go home tomorrow or needs another day.  Time spent: 28 minutes  Alford Highland  [employer]

## 2017-02-22 LAB — HEMOGLOBIN A1C
Hgb A1c MFr Bld: 5.6 % (ref 4.8–5.6)
Mean Plasma Glucose: 114.02 mg/dL

## 2017-02-22 LAB — BASIC METABOLIC PANEL
Anion gap: 5 (ref 5–15)
BUN: 25 mg/dL — ABNORMAL HIGH (ref 6–20)
CHLORIDE: 109 mmol/L (ref 101–111)
CO2: 24 mmol/L (ref 22–32)
Calcium: 9 mg/dL (ref 8.9–10.3)
Creatinine, Ser: 1.01 mg/dL (ref 0.61–1.24)
GFR calc non Af Amer: >60 mL/min (ref >60)
Glucose, Bld: 112 mg/dL — ABNORMAL HIGH (ref 65–99)
POTASSIUM: 4.4 mmol/L (ref 3.5–5.1)
SODIUM: 138 mmol/L (ref 135–145)

## 2017-02-22 LAB — CK: CK TOTAL: 169 U/L (ref 49–397)

## 2017-02-22 MED ORDER — METOPROLOL TARTRATE 25 MG PO TABS: 12.5000 mg | ORAL_TABLET | Freq: Two times a day (BID) | ORAL | 1 refills | Status: DC

## 2017-02-22 NOTE — Discharge Summary (Signed)
[facility] - [place] at [facility] [address]   PATIENT NAME: Bryce [name]    MR#:  [id number]  DATE OF BIRTH:  [birth year]  DATE OF ADMISSION:  02/20/2017   ADMITTING PHYSICIAN: Oralia Manis, MD  DATE OF DISCHARGE: 02/22/17  PRIMARY CARE PHYSICIAN: System, Pcp Not In   ADMISSION DIAGNOSIS:   Non-traumatic rhabdomyolysis [M62.82] Acute renal failure, unspecified acute renal failure type (HCC) [N17.9]  DISCHARGE DIAGNOSIS:   Principal Problem:   AKI (acute kidney injury) (HCC) Active Problems:   Essential hypertension   Hyperlipidemia   CAD (coronary artery disease)   Dehydration   SECONDARY DIAGNOSIS:   Past Medical History:  Diagnosis Date  . Chronic systolic congestive heart failure (HCC)   . Coronary artery disease   . Hypertension   . Ischemic cardiomyopathy   . MI (myocardial infarction) [facility])     HOSPITAL COURSE:   51 year old male with past medical history significant for hypertension, CAD with recent cardiac stent placement. Presents to hospital secondary to heat exhaustion and rhabdomyolysis  #1 rhabdomyolysis-CPKs were elevated, associated with renal failure. -IV fluids were given. Statin is on hold. -Advised to hold statin for another 2 weeks and consult with cardiology prior to restarting it. -CPK has normalized prior to discharge and muscle cramps have improved.   #2 acute renal failure-secondary to above, lisinopril as been discontinued -Gentle hydration given. Blood pressure was low on admission. Medications being adjusted. -Renal function is normalized.  #3 CAD status post stent on 02/02/17 in left circumflex for 100% occlusion. -Continue aspirin and Plavix -Statin and lisinopril held. -Metoprolol dose reduced for low blood pressure and also low normal heart rate. -No ischemic cardiomyopathy, EF of 45-50%. Well compensated at this time -Cardiology follow up in 1 week as outpatient  Stable for discharge today. Ambulating without any  discomfort  DISCHARGE CONDITIONS:   Guarded CONSULTS OBTAINED:    none  DRUG ALLERGIES:   No Known Allergies DISCHARGE MEDICATIONS:   Allergies as of 02/22/2017   No Known Allergies     Medication List    STOP taking these medications   lisinopril 20 MG tablet Commonly known as:  PRINIVIL,ZESTRIL   pravastatin 40 MG tablet Commonly known as:  PRAVACHOL     TAKE these medications   aspirin 81 MG EC tablet Take 1 tablet (81 mg total) by mouth daily.   clopidogrel 75 MG tablet Commonly known as:  PLAVIX Take 1 tablet (75 mg total) by mouth daily.   metoprolol tartrate 25 MG tablet Commonly known as:  LOPRESSOR Take 0.5 tablets (12.5 mg total) by mouth 2 (two) times daily. What changed:  medication strength  how much to take   nitroGLYCERIN 0.4 MG SL tablet Commonly known as:  NITROSTAT Place 1 tablet (0.4 mg total) under the tongue every 5 (five) minutes x 3 doses as needed for chest pain.   pantoprazole 40 MG tablet Commonly known as:  PROTONIX Take 1 tablet (40 mg total) by mouth daily.            Discharge Care Instructions        Start     Ordered   02/22/17 0000  metoprolol tartrate (LOPRESSOR) 25 MG tablet  2 times daily     02/22/17 0818       DISCHARGE INSTRUCTIONS:   1. PCP follow-up in 1-2 weeks 2. Cardiology follow-up in 1 week  DIET:   Cardiac diet  ACTIVITY:   Activity as tolerated  OXYGEN:   Home  Oxygen: No.  Oxygen Delivery: room air  DISCHARGE LOCATION:   home   If you experience worsening of your admission symptoms, develop shortness of breath, life threatening emergency, suicidal or homicidal thoughts you must seek medical attention immediately by calling 911 or calling your MD immediately  if symptoms less severe.  You Must read complete instructions/literature along with all the possible adverse reactions/side effects for all the Medicines you take and that have been prescribed to you. Take any new Medicines  after you have completely understood and accpet all the possible adverse reactions/side effects.   Please note  You were cared for by a hospitalist during your hospital stay. If you have any questions about your discharge medications or the care you received while you were in the hospital after you are discharged, you can call the unit and asked to speak with the hospitalist on call if the hospitalist that took care of you is not available. Once you are discharged, your primary care physician will handle any further medical issues. Please note that NO REFILLS for any discharge medications will be authorized once you are discharged, as it is imperative that you return to your primary care physician (or establish a relationship with a primary care physician if you do not have one) for your aftercare needs so that they can reassess your need for medications and monitor your lab values.    On the day of Discharge:  VITAL SIGNS:   Blood pressure 129/83, pulse 69, temperature 98.2 F (36.8 C), temperature source Oral, resp. rate 16, height  (1.727 m), weight 113.4 kg (250 lb), SpO2 98 %.  PHYSICAL EXAMINATION:    GENERAL:  51 y.o.-year-old obese patient lying in the bed with no acute distress.  EYES: Pupils equal, round, reactive to light and accommodation. No scleral icterus. Extraocular muscles intact.  HEENT: Head atraumatic, normocephalic. Oropharynx and nasopharynx clear.  NECK:  Supple, no jugular venous distention. No thyroid enlargement, no tenderness.  LUNGS: Normal breath sounds bilaterally, no wheezing, rales,rhonchi or crepitation. No use of accessory muscles of respiration.  CARDIOVASCULAR: S1, S2 normal. No murmurs, rubs, or gallops.  ABDOMEN: Soft, non-tender, non-distended. Bowel sounds present. No organomegaly or mass.  EXTREMITIES: No pedal edema, cyanosis, or clubbing.  NEUROLOGIC: Cranial nerves II through XII are intact. Muscle strength 5/5 in all extremities. Sensation  intact. Gait not checked.  PSYCHIATRIC: The patient is alert and oriented x 3.  SKIN: No obvious rash, lesion, or ulcer.   DATA REVIEW:   CBC  Recent Labs Lab 02/21/17 0554  WBC 5.7  HGB 12.9*  HCT 37.3*  PLT 218    Chemistries   Recent Labs Lab 02/22/17 0453  NA 138  K 4.4  CL 109  CO2 24  GLUCOSE 112*  BUN 25*  CREATININE 1.01  CALCIUM 9.0     Microbiology Results  Results for orders placed or performed during the hospital encounter of 02/02/17  MRSA PCR Screening     Status: None   Collection Time: 02/02/17 12:08 PM  Result Value Ref Range Status   MRSA by PCR NEGATIVE NEGATIVE Final    Comment:        The GeneXpert MRSA Assay (FDA approved for NASAL specimens only), is one component of a comprehensive MRSA colonization surveillance program. It is not intended to diagnose MRSA infection nor to guide or monitor treatment for MRSA infections.     RADIOLOGY:  No results found.   Management plans discussed with the patient,  family and they are in agreement.  CODE STATUS:     Code Status Orders        Start     Ordered   02/20/17 2332  Full code  Continuous     02/20/17 2331    Code Status History    Date Active Date Inactive Code Status Order ID Comments User Context   02/02/2017 12:12 PM 02/04/2017  5:22 PM Full Code [id number]  Ramonita Lab, MD Inpatient   02/02/2017 12:12 PM 02/02/2017 12:12 PM Full Code [id number]  Shaune Pollack, MD Inpatient   12/15/2015  6:51 PM 12/16/2015  2:08 PM Full Code [id number]  Hower, Cletis Athens, MD ED   12/28/2014  1:13 PM 12/28/2014  7:17 PM Full Code [id number]  Alwyn Pea, MD Inpatient   12/27/2014  1:47 PM 12/28/2014  1:13 PM Full Code [id number]  Milagros Loll, MD ED   12/22/2014 10:39 AM 12/23/2014  6:57 PM Full Code [id number]  Gale Journey, MD Inpatient      TOTAL TIME TAKING CARE OF THIS PATIENT: 37 minutes.    Enid Baas M.D on 02/22/2017 at 9:13 AM  Between 7am to 6pm - Pager -  [phone]  After 6pm go to [email or web address] - Social research officer, government  [facility] [place] [facility]  Office  [phone]  CC: Primary care physician; System, Pcp Not In   Note: This dictation was prepared with Dragon dictation along with smaller phrase technology. Any transcriptional errors that result from this process are unintentional.

## 2017-02-22 NOTE — Care Management Note (Signed)
Case Management Note  Patient Details  Name: Bryce [name] MRN: [id number] Date of Birth: 15-Sep-1965  Subjective/Objective:   Only medication change for uninsured Bryce [name] is [name] decrease in Lopressor from  to 25 mg PO daily which he can obtain for $4.00 from [facility].                  Action/Plan:   Expected Discharge Date:  02/22/17               Expected Discharge Plan:  Home/Self Care  In-House Referral:     Discharge planning Services  CM Consult  Post Acute Care Choice:  NA Choice offered to:  NA  DME Arranged:  N/[name] DME Agency:  NA  HH Arranged:  NA HH Agency:  NA (Only change in medications for uninsured Bryce [name]. )  Status of Service:  Completed, signed off  If discussed at Long Length of Stay Meetings, dates discussed:    Additional Comments:  Bryce Buist A, RN 02/22/2017, 8:29 AM

## 2017-02-22 NOTE — Progress Notes (Signed)
Pt to be discharged today. Iv and tele removed. disch instructions and prescrip given to pt. disch to home accompanied by his wife.

## 2017-02-23 LAB — URINE CULTURE

## 2017-02-25 ENCOUNTER — Emergency Department
Admission: EM | Admit: 2017-02-25 | Discharge: 2017-02-25 | Disposition: A | Discharge status: Home / Self Care | Payer: Self-pay | Attending: Emergency Medicine | Admitting: Emergency Medicine

## 2017-02-25 ENCOUNTER — Encounter: Payer: Self-pay | Admitting: Emergency Medicine

## 2017-02-25 DIAGNOSIS — Z87891 Personal history of nicotine dependence: Secondary | ICD-10-CM | POA: Insufficient documentation

## 2017-02-25 DIAGNOSIS — I110 Hypertensive heart disease with heart failure: Secondary | ICD-10-CM | POA: Insufficient documentation

## 2017-02-25 DIAGNOSIS — I10 Essential (primary) hypertension: Secondary | ICD-10-CM | POA: Insufficient documentation

## 2017-02-25 DIAGNOSIS — Z79899 Other long term (current) drug therapy: Secondary | ICD-10-CM | POA: Insufficient documentation

## 2017-02-25 DIAGNOSIS — I2510 Atherosclerotic heart disease of native coronary artery without angina pectoris: Secondary | ICD-10-CM | POA: Insufficient documentation

## 2017-02-25 DIAGNOSIS — Z7982 Long term (current) use of aspirin: Secondary | ICD-10-CM | POA: Insufficient documentation

## 2017-02-25 DIAGNOSIS — Z8673 Personal history of transient ischemic attack (TIA), and cerebral infarction without residual deficits: Secondary | ICD-10-CM | POA: Insufficient documentation

## 2017-02-25 DIAGNOSIS — F419 Anxiety disorder, unspecified: Secondary | ICD-10-CM | POA: Insufficient documentation

## 2017-02-25 DIAGNOSIS — Z7902 Long term (current) use of antithrombotics/antiplatelets: Secondary | ICD-10-CM | POA: Insufficient documentation

## 2017-02-25 DIAGNOSIS — I5022 Chronic systolic (congestive) heart failure: Secondary | ICD-10-CM | POA: Insufficient documentation

## 2017-02-25 LAB — BASIC METABOLIC PANEL
Anion gap: 9 (ref 5–15)
BUN: 21 mg/dL — ABNORMAL HIGH (ref 6–20)
CALCIUM: 9.1 mg/dL (ref 8.9–10.3)
CHLORIDE: 107 mmol/L (ref 101–111)
CO2: 24 mmol/L (ref 22–32)
CREATININE: 0.97 mg/dL (ref 0.61–1.24)
Glucose, Bld: 95 mg/dL (ref 65–99)
Potassium: 3.8 mmol/L (ref 3.5–5.1)
SODIUM: 140 mmol/L (ref 135–145)

## 2017-02-25 LAB — CBC
HCT: 37.9 % — ABNORMAL LOW (ref 40.0–52.0)
HEMOGLOBIN: 13.1 g/dL (ref 13.0–18.0)
MCH: 32.2 pg (ref 26.0–34.0)
MCHC: 34.7 g/dL (ref 32.0–36.0)
MCV: 92.9 fL (ref 80.0–100.0)
PLATELETS: 191 10*3/uL (ref 150–440)
RBC: 4.08 MIL/uL — ABNORMAL LOW (ref 4.40–5.90)
RDW: 14 % (ref 11.5–14.5)
WBC: 6 10*3/uL (ref 3.8–10.6)

## 2017-02-25 LAB — TROPONIN I

## 2017-02-25 MED ORDER — LORAZEPAM 0.5 MG PO TABS
0.5000 mg | ORAL_TABLET | Freq: Once | ORAL | Status: AC
  Administered 2017-02-25: 0.5 mg via ORAL
  Filled 2017-02-25: qty 1

## 2017-02-25 NOTE — ED Notes (Signed)
ED Provider at bedside. 

## 2017-02-25 NOTE — ED Triage Notes (Signed)
Patient ambulatory to triage with steady gait, without difficulty or distress noted; pt d/c from hosp Saturday with med changes (stopped lisinopril /day; metoprolol Bryce [name] daily--12.5mg /twice daily, stopped pravastatin); tonight noted BP elevated at home and "didn't feel right"; denies any c/o pain

## 2017-02-25 NOTE — ED Provider Notes (Signed)
[facility] Emergency Department Provider Note   ____________________________________________   First MD Initiated Contact with Patient 02/25/17 0021     (approximate)  I have reviewed the triage vital signs and the nursing notes.   HISTORY  Chief Complaint Hypertension    HPI Bryce [name] is a 51 y.o. male who comes into the hospital today with some elevation of his blood pressureand not feeling right. The patient was here on September 9 and to having a STEMI. The patient had a stent placed and was placed in the ICU. Everything was well and he went back to work almost 2 weeks after. He reports that he had been at work from a week and started having some cramping all over his body. It had been hot and he had not been drinking much. He came into the hospital this weekend with body cramping and was found to have some acute renal insufficiency and dehydration. The patient stayed in the hospital as he was hypotensive and had some of his medications changed. He states that he relax over the weekend he went back to work today. He seemed more tired than normal states that he fell off. He went to dinner this evening and felt jittery and anxious. He took his blood pressure at home and it read 179/155. He also stated that his heart rate was 103. The patient reports that because of his previous visits he was concerned so he decided to come into the hospital to get checked out.   Past Medical History:  Diagnosis Date  . Chronic systolic congestive heart failure (HCC)   . Coronary artery disease   . Hypertension   . Ischemic cardiomyopathy   . MI (myocardial infarction) [facility])     Patient Active Problem List   Diagnosis Date Noted  . CAD (coronary artery disease) 02/20/2017  . Dehydration 02/20/2017  . STEMI (ST elevation myocardial infarction) (HCC) 02/02/2017  . AKI (acute kidney injury) (HCC) 12/15/2015  . Solitary pulmonary nodule 12/23/2014  . Chest pain  12/22/2014  . Essential hypertension 12/22/2014  . Hyperlipidemia 12/22/2014    Past Surgical History:  Procedure Laterality Date  . CARDIAC CATHETERIZATION N/A 12/28/2014   Procedure: Left Heart Cath and Coronary Angiography;  Surgeon: Alwyn Pea, MD;  Location: [facility] INVASIVE CV LAB;  Service: Cardiovascular;  Laterality: N/A;  . CORONARY ANGIOPLASTY     Non-ST elevation MI status post stenting within bare-metal stent of the 75% lesion of the LAD, 11/13/2010.   . CORONARY STENT INTERVENTION N/A 02/02/2017   Procedure: CORONARY/GRAFT ACUTE MI REVASCULARIZATION;  Surgeon: Alwyn Pea, MD;  Location: [facility] INVASIVE CV LAB;  Service: Cardiovascular;  Laterality: N/A;  . LEFT HEART CATH AND CORONARY ANGIOGRAPHY N/A 02/02/2017   Procedure: LEFT HEART CATH AND CORONARY ANGIOGRAPHY;  Surgeon: Alwyn Pea, MD;  Location: [facility] INVASIVE CV LAB;  Service: Cardiovascular;  Laterality: N/A;    Prior to Admission medications   Medication Sig Start Date End Date Taking? Authorizing Provider  aspirin EC 81 MG EC tablet Take 1 tablet (81 mg total) by mouth daily. 02/05/17   Katha Hamming, MD  clopidogrel (PLAVIX) 75 MG tablet Take 1 tablet (75 mg total) by mouth daily. 02/05/17   Katha Hamming, MD  metoprolol tartrate (LOPRESSOR) 25 MG tablet Take 0.5 tablets (12.5 mg total) by mouth 2 (two) times daily. 02/22/17   Enid Baas, MD  nitroGLYCERIN (NITROSTAT) 0.4 MG SL tablet Place 1 tablet (0.4 mg total) under the tongue  every 5 (five) minutes x 3 doses as needed for chest pain. 02/04/17   Katha Hamming, MD  pantoprazole (PROTONIX) 40 MG tablet Take 1 tablet (40 mg total) by mouth daily. 02/05/17   Katha Hamming, MD    Allergies Patient has no known allergies.  Family History  Problem Relation Age of Onset  . CAD Father     Social History Social History  Substance Use Topics  . Smoking status: Former Smoker    Packs/day: 1.00    Types: Cigarettes  .  Smokeless tobacco: Never Used  . Alcohol use 0.6 oz/week    1 Cans of beer per week     Comment: "every now and then"    Review of Systems  Constitutional: No fever/chills Eyes: No visual changes. ENT: No sore throat. Cardiovascular: Denies chest pain. Respiratory: Denies shortness of breath. Gastrointestinal: No abdominal pain.  No nausea, no vomiting.  No diarrhea.  No constipation. Genitourinary: Negative for dysuria. Musculoskeletal: Negative for back pain. Skin: Negative for rash. Neurological: Negative for headaches, focal weakness or numbness. psych: Anxious  ____________________________________________   PHYSICAL EXAM:  VITAL SIGNS: ED Triage Vitals [02/25/17 0009]  Enc Vitals Group     BP (!) 141/88     Pulse Rate 76     Resp 18     Temp      Temp src      SpO2 97 %     Weight 250 lb (113.4 kg)     Height  (1.727 m)     Head Circumference      Peak Flow      Pain Score      Pain Loc      Pain Edu?      Excl. in GC?     Constitutional: Alert and oriented. Well appearing and in no distress. Eyes: Conjunctivae are normal. PERRL. EOMI. Head: Atraumatic. Nose: No congestion/rhinnorhea. Mouth/Throat: Mucous membranes are moist.  Oropharynx non-erythematous. Cardiovascular: Normal rate, regular rhythm. Grossly normal heart sounds.  Good peripheral circulation. Respiratory: Normal respiratory effort.  No retractions. Lungs CTAB. Gastrointestinal: Soft and nontender. No distention. positive bowel sounds Musculoskeletal: No lower extremity tenderness nor edema.   Neurologic:  Normal speech and language.  Skin:  Skin is warm, dry and intact.  Psychiatric: Mood and affect are normal.   ____________________________________________   LABS (all labs ordered are listed, but only abnormal results are displayed)  Labs Reviewed  CBC - Abnormal; Notable for the following:       Result Value   RBC 4.08 (*)    HCT 37.9 (*)    All other components within  normal limits  BASIC METABOLIC PANEL - Abnormal; Notable for the following:    BUN 21 (*)    All other components within normal limits  TROPONIN I   ____________________________________________  EKG  ED ECG REPORT I, Rebecka Apley, the attending physician, personally viewed and interpreted this ECG.   Date: 02/25/2017  EKG Time: 0022  Rate: 72  Rhythm: normal sinus rhythm  Axis: normal  Intervals:none  ST&T Change: none  ____________________________________________  RADIOLOGY  No results found.  ____________________________________________   PROCEDURES  Procedure(s) performed: None  Procedures  Critical Care performed: No  ____________________________________________   INITIAL IMPRESSION / ASSESSMENT AND PLAN / ED COURSE  Pertinent labs & imaging results that were available during my care of the patient were reviewed by me and considered in my medical decision making (see chart for details).  This  is a 51 year old male who comes into the hospital today with some elevation of his blood pressure and not feeling right. He reports that he is feeling jittery and anxious. He states that he rather feel safe than sorry so decided to come in.  Given the patient's history my concern is for recurrent ACS although the patient is not having any chest pain or shortness of breath. I am also concerned about renal insufficiency or electrolyte abnormalities given the patient's recent diagnosis of renal insufficiency.  The patient's blood work is unremarkable and I did give the patient a dose of Ativan 0.5 mg. He reports that the jittery feeling is gone. the patient reports that he feels well and his blood pressure here has been unremarkable. The patient will be discharged home to follow-up with his primary care physician.      ____________________________________________   FINAL CLINICAL IMPRESSION(S) / ED DIAGNOSES  Final diagnoses:  Hypertension, unspecified type    Anxiety      NEW MEDICATIONS STARTED DURING THIS VISIT:  Discharge Medication List as of 02/25/2017  2:27 AM       Note:  This document was prepared using Dragon voice recognition software and may include unintentional dictation errors.    Rebecka Apley, MD 02/25/17 [phone]

## 2017-02-25 NOTE — Discharge Instructions (Signed)
Please follow up with your primary care physician for further evaluation of your symptoms.  °

## 2017-02-25 NOTE — ED Notes (Addendum)
Pt recently had cardiac med changes, today c/o "jittery , anxious feelings" , pt states took BP at home and was 179/155 and wants to be evaluated. Pt in NAD at this time., denies CP or SOB

## 2017-03-05 ENCOUNTER — Encounter: Payer: Self-pay | Admitting: Emergency Medicine

## 2017-03-05 ENCOUNTER — Observation Stay
Admission: EM | Admit: 2017-03-05 | Discharge: 2017-03-06 | Disposition: A | Discharge status: Home / Self Care | Payer: Self-pay | Attending: Internal Medicine | Admitting: Internal Medicine

## 2017-03-05 ENCOUNTER — Observation Stay: Payer: Self-pay

## 2017-03-05 DIAGNOSIS — Z79899 Other long term (current) drug therapy: Secondary | ICD-10-CM | POA: Insufficient documentation

## 2017-03-05 DIAGNOSIS — E860 Dehydration: Secondary | ICD-10-CM | POA: Insufficient documentation

## 2017-03-05 DIAGNOSIS — I255 Ischemic cardiomyopathy: Secondary | ICD-10-CM | POA: Insufficient documentation

## 2017-03-05 DIAGNOSIS — I5022 Chronic systolic (congestive) heart failure: Secondary | ICD-10-CM | POA: Insufficient documentation

## 2017-03-05 DIAGNOSIS — Z7982 Long term (current) use of aspirin: Secondary | ICD-10-CM | POA: Insufficient documentation

## 2017-03-05 DIAGNOSIS — Z7902 Long term (current) use of antithrombotics/antiplatelets: Secondary | ICD-10-CM | POA: Insufficient documentation

## 2017-03-05 DIAGNOSIS — I2510 Atherosclerotic heart disease of native coronary artery without angina pectoris: Secondary | ICD-10-CM | POA: Insufficient documentation

## 2017-03-05 DIAGNOSIS — R519 Headache, unspecified: Secondary | ICD-10-CM

## 2017-03-05 DIAGNOSIS — I252 Old myocardial infarction: Secondary | ICD-10-CM | POA: Insufficient documentation

## 2017-03-05 DIAGNOSIS — R34 Anuria and oliguria: Secondary | ICD-10-CM

## 2017-03-05 DIAGNOSIS — R51 Headache: Secondary | ICD-10-CM | POA: Insufficient documentation

## 2017-03-05 DIAGNOSIS — Z87891 Personal history of nicotine dependence: Secondary | ICD-10-CM | POA: Insufficient documentation

## 2017-03-05 DIAGNOSIS — I959 Hypotension, unspecified: Secondary | ICD-10-CM | POA: Insufficient documentation

## 2017-03-05 DIAGNOSIS — I110 Hypertensive heart disease with heart failure: Secondary | ICD-10-CM | POA: Insufficient documentation

## 2017-03-05 DIAGNOSIS — N179 Acute kidney failure, unspecified: Principal | ICD-10-CM

## 2017-03-05 LAB — COMPREHENSIVE METABOLIC PANEL
ALT: 28 U/L (ref 17–63)
ANION GAP: 13 (ref 5–15)
AST: 25 U/L (ref 15–41)
Albumin: 4.6 g/dL (ref 3.5–5.0)
Alkaline Phosphatase: 58 U/L (ref 38–126)
BUN: 37 mg/dL — ABNORMAL HIGH (ref 6–20)
CHLORIDE: 102 mmol/L (ref 101–111)
CO2: 23 mmol/L (ref 22–32)
Calcium: 9.6 mg/dL (ref 8.9–10.3)
Creatinine, Ser: 3.66 mg/dL — ABNORMAL HIGH (ref 0.61–1.24)
GFR calc non Af Amer: 18 mL/min — ABNORMAL LOW (ref >60)
GFR, EST AFRICAN AMERICAN: 20 mL/min — AB (ref >60)
Glucose, Bld: 117 mg/dL — ABNORMAL HIGH (ref 65–99)
Potassium: 3.9 mmol/L (ref 3.5–5.1)
SODIUM: 138 mmol/L (ref 135–145)
Total Bilirubin: 0.8 mg/dL (ref 0.3–1.2)
Total Protein: 7.8 g/dL (ref 6.5–8.1)

## 2017-03-05 LAB — CBC WITH DIFFERENTIAL/PLATELET
BASOS PCT: 1 %
Basophils Absolute: 0 10*3/uL (ref 0–0.1)
Eosinophils Absolute: 0.1 10*3/uL (ref 0–0.7)
Eosinophils Relative: 1 %
HEMATOCRIT: 41.4 % (ref 40.0–52.0)
Hemoglobin: 14.3 g/dL (ref 13.0–18.0)
LYMPHS ABS: 2 10*3/uL (ref 1.0–3.6)
Lymphocytes Relative: 31 %
MCH: 32.3 pg (ref 26.0–34.0)
MCHC: 34.5 g/dL (ref 32.0–36.0)
MCV: 93.6 fL (ref 80.0–100.0)
MONOS PCT: 9 %
Monocytes Absolute: 0.6 10*3/uL (ref 0.2–1.0)
NEUTROS ABS: 3.7 10*3/uL (ref 1.4–6.5)
NEUTROS PCT: 58 %
Platelets: 210 10*3/uL (ref 150–440)
RBC: 4.43 MIL/uL (ref 4.40–5.90)
RDW: 14.1 % (ref 11.5–14.5)
WBC: 6.4 10*3/uL (ref 3.8–10.6)

## 2017-03-05 LAB — PROTEIN / CREATININE RATIO, URINE
CREATININE, URINE: 253 mg/dL
Protein Creatinine Ratio: 0.05 mg/mg{Cre} (ref 0.00–0.15)
TOTAL PROTEIN, URINE: 13 mg/dL

## 2017-03-05 LAB — URINALYSIS, ROUTINE W REFLEX MICROSCOPIC
BILIRUBIN URINE: NEGATIVE
Glucose, UA: NEGATIVE mg/dL
HGB URINE DIPSTICK: NEGATIVE
KETONES UR: NEGATIVE mg/dL
Leukocytes, UA: NEGATIVE
Nitrite: NEGATIVE
Protein, ur: NEGATIVE mg/dL
Specific Gravity, Urine: 1.016 (ref 1.005–1.030)
pH: 5 (ref 5.0–8.0)

## 2017-03-05 LAB — CORTISOL: CORTISOL PLASMA: 6 ug/dL

## 2017-03-05 LAB — CK: Total CK: 233 U/L (ref 49–397)

## 2017-03-05 MED ORDER — ASPIRIN EC 81 MG PO TBEC
81.0000 mg | DELAYED_RELEASE_TABLET | Freq: Every day | ORAL | Status: DC
  Administered 2017-03-05 – 2017-03-06 (×2): 81 mg via ORAL
  Filled 2017-03-05 (×2): qty 1

## 2017-03-05 MED ORDER — SODIUM CHLORIDE 0.9 % IV SOLN
INTRAVENOUS | Status: DC
  Administered 2017-03-05 – 2017-03-06 (×3): via INTRAVENOUS

## 2017-03-05 MED ORDER — ONDANSETRON HCL 4 MG PO TABS: 4.0000 mg | ORAL_TABLET | Freq: Four times a day (QID) | ORAL | Status: DC | PRN

## 2017-03-05 MED ORDER — SODIUM CHLORIDE 0.9 % IV BOLUS (SEPSIS)
500.0000 mL | Freq: Once | INTRAVENOUS | Status: AC
  Administered 2017-03-05: 500 mL via INTRAVENOUS

## 2017-03-05 MED ORDER — PROCHLORPERAZINE EDISYLATE 5 MG/ML IJ SOLN
10.0000 mg | Freq: Once | INTRAMUSCULAR | Status: AC
  Administered 2017-03-05: 10 mg via INTRAVENOUS
  Filled 2017-03-05: qty 2

## 2017-03-05 MED ORDER — ACETAMINOPHEN 325 MG PO TABS
650.0000 mg | ORAL_TABLET | Freq: Four times a day (QID) | ORAL | Status: DC | PRN
  Administered 2017-03-05 (×2): 650 mg via ORAL
  Filled 2017-03-05 (×2): qty 2

## 2017-03-05 MED ORDER — METOPROLOL TARTRATE 25 MG PO TABS
12.5000 mg | ORAL_TABLET | Freq: Two times a day (BID) | ORAL | Status: DC
  Administered 2017-03-05: 12.5 mg via ORAL
  Filled 2017-03-05: qty 1

## 2017-03-05 MED ORDER — ACETAMINOPHEN 650 MG RE SUPP: 650.0000 mg | Freq: Four times a day (QID) | RECTAL | Status: DC | PRN

## 2017-03-05 MED ORDER — CLOPIDOGREL BISULFATE 75 MG PO TABS
75.0000 mg | ORAL_TABLET | Freq: Every day | ORAL | Status: DC
  Administered 2017-03-05 – 2017-03-06 (×2): 75 mg via ORAL
  Filled 2017-03-05 (×2): qty 1

## 2017-03-05 MED ORDER — DIPHENHYDRAMINE HCL 50 MG/ML IJ SOLN
12.5000 mg | Freq: Once | INTRAMUSCULAR | Status: AC
Start: 2017-03-05 — End: 2017-03-05
  Administered 2017-03-05: 12.5 mg via INTRAVENOUS
  Filled 2017-03-05: qty 1

## 2017-03-05 MED ORDER — ONDANSETRON HCL 4 MG/2ML IJ SOLN: 4.0000 mg | Freq: Four times a day (QID) | INTRAMUSCULAR | Status: DC | PRN

## 2017-03-05 MED ORDER — NITROGLYCERIN 0.4 MG SL SUBL: 0.4000 mg | SUBLINGUAL_TABLET | SUBLINGUAL | Status: DC | PRN

## 2017-03-05 MED ORDER — SENNOSIDES-DOCUSATE SODIUM 8.6-50 MG PO TABS: 1.0000 | ORAL_TABLET | Freq: Every evening | ORAL | Status: DC | PRN

## 2017-03-05 MED ORDER — HEPARIN SODIUM (PORCINE) 5000 UNIT/ML IJ SOLN
5000.0000 [IU] | Freq: Three times a day (TID) | INTRAMUSCULAR | Status: DC
  Administered 2017-03-05 – 2017-03-06 (×4): 5000 [IU] via SUBCUTANEOUS
  Filled 2017-03-05 (×4): qty 1

## 2017-03-05 MED ORDER — ACETAMINOPHEN 325 MG PO TABS
650.0000 mg | ORAL_TABLET | Freq: Once | ORAL | Status: AC
  Administered 2017-03-05: 650 mg via ORAL
  Filled 2017-03-05: qty 2

## 2017-03-05 NOTE — Consult Note (Signed)
Date: 03/05/2017                  Patient Name:  Bryce [name]  MRN: [id number]  DOB: 07/23/1965  Age / Sex: 51 y.o., male         PCP: System, Pcp Not In                 Service Requesting Consult: IM/ Auburn Bilberry, MD                 Reason for Consult: ARF            History of Present Illness: Patient is a 51 y.o. male with medical problems of Coronary disease, STEMI, recent drug-eluting stent in left circumflex in September 2018, chronic systolic congestive heart failure, hypertension, who was admitted to [facility] on 03/05/2017 for evaluation of acute renal failure.  Patient presented to ER with generalized body ache and headaches. He works in [employer] and is outside mostly. He states he was sweating a lot. He had some low blood pressures earlier this morning to 90/55. His baseline creatinine is 0.97 from September 25. Presenting creatinine is 3.66 from this morning. A renal ultrasound done today is normal.  Currently he is getting IV fluids. He feels well. Denies any nausea or vomiting. Appetite is okay. No leg edema. No shortness of breath. Denies blood in the urine. He has remote history of kidney stones. Urinalysis from previous admission of September 20 - showed large hemoglobin and too numerous to count RBCs. Protein 100 mg/dL   Medications: Outpatient medications: Prescriptions Prior to Admission  Medication Sig Dispense Refill Last Dose  . aspirin EC 81 MG EC tablet Take 1 tablet (81 mg total) by mouth daily. 30 tablet 0 03/04/2017 at Unknown time  . clopidogrel (PLAVIX) 75 MG tablet Take 1 tablet (75 mg total) by mouth daily. 90 tablet 0 03/04/2017 at Unknown time  . lisinopril (PRINIVIL,ZESTRIL) 20 MG tablet Take 20 mg by mouth daily.   03/04/2017 at Unknown time  . metoprolol tartrate (LOPRESSOR) 25 MG tablet Take 0.5 tablets (12.5 mg total) by mouth 2 (two) times daily. 60 tablet 1 03/04/2017 at Unknown time  . nitroGLYCERIN (NITROSTAT) 0.4 MG SL tablet Place 1  tablet (0.4 mg total) under the tongue every 5 (five) minutes x 3 doses as needed for chest pain. 30 tablet 0 prn at prn  . pantoprazole (PROTONIX) 40 MG tablet Take 1 tablet (40 mg total) by mouth daily. 30 tablet 0 03/04/2017 at Unknown time  . pravastatin (PRAVACHOL) 40 MG tablet Take 40 mg by mouth daily.   03/04/2017 at Unknown time    Current medications: Current Facility-Administered Medications  Medication Dose Route Frequency Provider Last Rate Last Dose  . 0.9 %  sodium chloride infusion   Intravenous Continuous Auburn Bilberry, MD 100 mL/hr at 03/05/17 0901    . acetaminophen (TYLENOL) tablet 650 mg  650 mg Oral Q6H PRN Ihor Austin, MD   650 mg at 03/05/17 0900   Or  . acetaminophen (TYLENOL) suppository 650 mg  650 mg Rectal Q6H PRN Ihor Austin, MD      . aspirin EC tablet 81 mg  81 mg Oral Daily Pyreddy, Vivien Rota, MD   81 mg at 03/05/17 0901  . clopidogrel (PLAVIX) tablet 75 mg  75 mg Oral Daily Pyreddy, Vivien Rota, MD   75 mg at 03/05/17 0901  . heparin injection 5,000 Units  5,000 Units Subcutaneous Q8H Ihor Austin, MD  5,000 Units at 03/05/17 0901  . metoprolol tartrate (LOPRESSOR) tablet 12.5 mg  12.5 mg Oral BID Pyreddy, Vivien Rota, MD   12.5 mg at 03/05/17 0900  . nitroGLYCERIN (NITROSTAT) SL tablet 0.4 mg  0.4 mg Sublingual Q5 Min x 3 PRN Pyreddy, Pavan, MD      . ondansetron (ZOFRAN) tablet 4 mg  4 mg Oral Q6H PRN Pyreddy, Pavan, MD       Or  . ondansetron (ZOFRAN) injection 4 mg  4 mg Intravenous Q6H PRN Pyreddy, Pavan, MD      . senna-docusate (Senokot-S) tablet 1 tablet  1 tablet Oral QHS PRN Ihor Austin, MD          Allergies: No Known Allergies    Past Medical History: Past Medical History:  Diagnosis Date  . Chronic systolic congestive heart failure (HCC)   . Coronary artery disease   . Hypertension   . Ischemic cardiomyopathy   . MI (myocardial infarction) [facility])      Past Surgical History: Past Surgical History:  Procedure Laterality Date  . CARDIAC  CATHETERIZATION N/A 12/28/2014   Procedure: Left Heart Cath and Coronary Angiography;  Surgeon: Alwyn Pea, MD;  Location: [facility] INVASIVE CV LAB;  Service: Cardiovascular;  Laterality: N/A;  . CORONARY ANGIOPLASTY     Non-ST elevation MI status post stenting within bare-metal stent of the 75% lesion of the LAD, 11/13/2010.   . CORONARY STENT INTERVENTION N/A 02/02/2017   Procedure: CORONARY/GRAFT ACUTE MI REVASCULARIZATION;  Surgeon: Alwyn Pea, MD;  Location: [facility] INVASIVE CV LAB;  Service: Cardiovascular;  Laterality: N/A;  . LEFT HEART CATH AND CORONARY ANGIOGRAPHY N/A 02/02/2017   Procedure: LEFT HEART CATH AND CORONARY ANGIOGRAPHY;  Surgeon: Alwyn Pea, MD;  Location: [facility] INVASIVE CV LAB;  Service: Cardiovascular;  Laterality: N/A;     Family History: Family History  Problem Relation Age of Onset  . CAD Father   . Kidney disease Neg Hx   . Diabetes Mellitus II Neg Hx      Social History: Social History   Social History  . Marital status: Single    Spouse name: N/A  . Number of children: N/A  . Years of education: N/A   Occupational History  . Merchant [employer] officer    Social History Main Topics  . Smoking status: Former Smoker    Packs/day: 1.00    Years: 5.00    Types: Cigarettes    Quit date: 01/05/2017  . Smokeless tobacco: Never Used  . Alcohol use No  . Drug use: Yes    Types: Marijuana  . Sexual activity: Yes   Other Topics Concern  . Not on file   Social History Narrative  . No narrative on file     Review of Systems: Gen: No fevers or chills. Felt hot at home. HEENT: Denies any vision or hearing problems CV: Recent cardiac stent placement. No chest pain since then Resp: No shortness of breath, cough or sputum production GI: No nausea, vomiting. Appetite has been good GU : No blood in the urine. Remote history of kidney stones MS: Denies any complaints Derm:  No complaints  Psych: No complaints Heme: No complaints Neuro: No  complaints Endocrine: No complaints  Vital Signs: Blood pressure 120/74, pulse (!) 56, temperature 97.8 F (36.6 C), temperature source Oral, resp. rate 17, height  (1.727 m), weight 109.3 kg (241 lb), SpO2 97 %.   Intake/Output Summary (Last 24 hours) at 03/05/17 1250 Last data filed at  03/05/17 1027  Gross per 24 hour  Intake              740 ml  Output                0 ml  Net              740 ml    Weight trends: [employer]   03/05/17 0458  Weight: 109.3 kg (241 lb)    Physical Exam: General:  No acute distress, laying in the bed   HEENT Anicteric, moist oral mucous membranes   Neck:  Supple, no JVD   Lungs: Normal breathing effort, clear to auscultation   Heart::  Regular rhythm, no rub or gallop   Abdomen: Soft, nontender, nondistended   Extremities:  No peripheral edema   Neurologic: Alert, oriented   Skin: No acute rashes              Lab results: Basic Metabolic Panel:  Recent Labs Lab 03/05/17 0456  NA 138  K 3.9  CL 102  CO2 23  GLUCOSE 117*  BUN 37*  CREATININE 3.66*  CALCIUM 9.6    Liver Function Tests:  Recent Labs Lab 03/05/17 0456  AST 25  ALT 28  ALKPHOS 58  BILITOT 0.8  PROT 7.8  ALBUMIN 4.6   No results for input(s): LIPASE, AMYLASE in the last 168 hours. No results for input(s): AMMONIA in the last 168 hours.  CBC:  Recent Labs Lab 03/05/17 0456  WBC 6.4  NEUTROABS 3.7  HGB 14.3  HCT 41.4  MCV 93.6  PLT 210    Cardiac Enzymes:  Recent Labs Lab 03/05/17 0456  CKTOTAL 233    BNP: Invalid input(s): POCBNP  CBG: No results for input(s): GLUCAP in the last 168 hours.  Microbiology: Recent Results (from the past 720 hour(s))  Urine Culture     Status: Abnormal   Collection Time: 02/21/17  4:30 PM  Result Value Ref Range Status   Specimen Description URINE, RANDOM  Final   Special Requests NONE  Final   Culture (A)  Final    <10,000 COLONIES/mL Performed at [facility], [address], [place], [place] [address]    Report Status 02/23/2017 FINAL  Final     Coagulation Studies: No results for input(s): LABPROT, INR in the last 72 hours.  Urinalysis: No results for input(s): COLORURINE, LABSPEC, PHURINE, GLUCOSEU, HGBUR, BILIRUBINUR, KETONESUR, PROTEINUR, UROBILINOGEN, NITRITE, LEUKOCYTESUR in the last 72 hours.  Invalid input(s): APPERANCEUR      Imaging: US Renal  Result Date: 03/05/2017 CLINICAL DATA:  Acute renal insufficiency. Acute left flank and back pain for the past 2 days. Coronary artery disease. EXAM: RENAL / URINARY TRACT ULTRASOUND COMPLETE COMPARISON:  CT scan of the abdomen and pelvis of June 28, 2014 FINDINGS: Right Kidney: Length: 12.2 cm. Echogenicity within normal limits. No mass or hydronephrosis visualized. Left Kidney: Length: 13.7 cm. Echogenicity within normal limits. No mass or hydronephrosis visualized. Bladder: Appears normal for degree of bladder distention. Bilateral ureteral jets are observed. IMPRESSION: Normal renal ultrasound examination. Electronically Signed   By: David  [place] M.D.   On: 03/05/2017 07:34      Assessment & Plan: Pt is a 51 y.o. caucasian  male with medical problems of Coronary disease, STEMI, recent drug-eluting stent in left circumflex in September 2018, chronic systolic congestive heart failure, hypertension, who was admitted to [facility] on 03/05/2017 for evaluation of acute renal failure.  1. Acute renal failure Likely prerenal from volume depletion with progression to ATN BMP today to assess serum creatinine trends Renal ultrasound is unremarkable Previous urinalysis showed hematuria.  We will repeat it today.  2. Hypertension Again likely from volume depletion Agree with IV fluid supplementation  Will follow

## 2017-03-05 NOTE — ED Triage Notes (Signed)
Pt arived to the ED for complaints of generalized body aches and headache. Pt reports that in the last 2 months he had a heart attack and kidney failure. 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 Pt is  AOx4 in moderate pain distress.

## 2017-03-05 NOTE — ED Notes (Signed)
Patient transported to US at this time.  Will continue to monitor.   

## 2017-03-05 NOTE — ED Notes (Signed)
Patient is resting comfortably at this time with no signs of distress present. VS stable. Will continue to monitor.   

## 2017-03-05 NOTE — ED Notes (Signed)
Admitting md at bedside

## 2017-03-05 NOTE — Care Management Note (Signed)
Case Management Note  Patient Details  Name: Bryce [name] MRN: [id number] Date of Birth: November 16, 1965  Subjective/Objective:   Admitted to [facility] under observation status with the diagnosis of acute kidney injury. Lives with wife. Mother is Darel Hong [phone]). Prescriptions have been filled at [facility] in the past, [facility] Takes care of all basic activities of daily living himself.               Action/Plan: Will continue to follow for discharge needs, if identified   Expected Discharge Date:  03/07/17               Expected Discharge Plan:     In-House Referral:     Discharge planning Services     Post Acute Care Choice:    Choice offered to:     DME Arranged:    DME Agency:     HH Arranged:    HH Agency:     Status of Service:     If discussed at [employer], dates discussed:    Additional Comments:  Gwenette Greet, RN MSN CCM Care Management [phone] 03/05/2017, 1:09 PM

## 2017-03-05 NOTE — Progress Notes (Signed)
[facility] - [place] at [facility]                                                                                                                                                                                  Patient Demographics   Bryce [name], is a 51 y.o. male, DOB - 12-Apr-1966, EAV:[id number]  Admit date - 03/05/2017   Admitting Physician Ihor Austin, MD  Outpatient Primary MD for the patient is System, Pcp Not In   LOS - 0  Subjective: Patient admitted with acute renal failure and dehydration. He states that he has been out in the sun. He was feeling weak yesterday feeling little better   Review of Systems:   CONSTITUTIONAL: No documented fever. Positive fatigue, weakness. No weight gain, no weight loss.  EYES: No blurry or double vision.  ENT: No tinnitus. No postnasal drip. No redness of the oropharynx.  RESPIRATORY: No cough, no wheeze, no hemoptysis. No dyspnea.  CARDIOVASCULAR: No chest pain. No orthopnea. No palpitations. No syncope.  GASTROINTESTINAL: No nausea, no vomiting or diarrhea. No abdominal pain. No melena or hematochezia.  GENITOURINARY: No dysuria or hematuria.  ENDOCRINE: No polyuria or nocturia. No heat or cold intolerance.  HEMATOLOGY: No anemia. No bruising. No bleeding.  INTEGUMENTARY: No rashes. No lesions.  MUSCULOSKELETAL: No arthritis. No swelling. No gout.  NEUROLOGIC: No numbness, tingling, or ataxia. No seizure-type activity.  PSYCHIATRIC: No anxiety. No insomnia. No ADD.    Vitals:   Vitals:   03/05/17 0630 03/05/17 0738 03/05/17 0840 03/05/17 1252  BP: (!) 90/55 114/68 120/74 93/64  Pulse: 65 63 (!) 56 (!) 53  Resp: (!) Temp:   97.8 F (36.6 C) 98.1 F (36.7 C)  TempSrc:   Oral   SpO2: 93% 93% 97% 91%  Weight:      Height:        Wt Readings from Last 3 Encounters:  03/05/17 241 lb (109.3 kg)  02/25/17 250 lb (113.4 kg)  02/20/17 250 lb (113.4 kg)     Intake/Output Summary (Last 24 hours)  at 03/05/17 1413 Last data filed at 03/05/17 1352  Gross per 24 hour  Intake             1220 ml  Output                0 ml  Net             1220 ml    Physical Exam:   GENERAL: Pleasant-appearing in no apparent distress.  HEAD, EYES, EARS, NOSE AND THROAT: Atraumatic, normocephalic. Extraocular muscles are intact. Pupils equal and reactive to light. Sclerae anicteric.  No conjunctival injection. No oro-pharyngeal erythema.  NECK: Supple. There is no jugular venous distention. No bruits, no lymphadenopathy, no thyromegaly.  HEART: Regular rate and rhythm,. No murmurs, no rubs, no clicks.  LUNGS: Clear to auscultation bilaterally. No rales or rhonchi. No wheezes.  ABDOMEN: Soft, flat, nontender, nondistended. Has good bowel sounds. No hepatosplenomegaly appreciated.  EXTREMITIES: No evidence of any cyanosis, clubbing, or peripheral edema.  +2 pedal and radial pulses bilaterally.  NEUROLOGIC: The patient is alert, awake, and oriented x3 with no focal motor or sensory deficits appreciated bilaterally.  SKIN: Moist and warm with no rashes appreciated.  Psych: Not anxious, depressed LN: No inguinal LN enlargement    Antibiotics   Anti-infectives    None      Medications   Scheduled Meds: . aspirin EC  81 mg Oral Daily  . clopidogrel  75 mg Oral Daily  . heparin  5,000 Units Subcutaneous Q8H  . metoprolol tartrate  12.5 mg Oral BID   Continuous Infusions: . sodium chloride 100 mL/hr at 03/05/17 0901   PRN Meds:.acetaminophen **OR** acetaminophen, nitroGLYCERIN, ondansetron **OR** ondansetron (ZOFRAN) IV, senna-docusate   Data Review:   Micro Results No results found for this or any previous visit (from the past 240 hour(s)).  Radiology Reports US Renal  Result Date: 03/05/2017 CLINICAL DATA:  Acute renal insufficiency. Acute left flank and back pain for the past 2 days. Coronary artery disease. EXAM: RENAL / URINARY TRACT ULTRASOUND COMPLETE COMPARISON:  CT scan of the  abdomen and pelvis of June 28, 2014 FINDINGS: Right Kidney: Length: 12.2 cm. Echogenicity within normal limits. No mass or hydronephrosis visualized. Left Kidney: Length: 13.7 cm. Echogenicity within normal limits. No mass or hydronephrosis visualized. Bladder: Appears normal for degree of bladder distention. Bilateral ureteral jets are observed. IMPRESSION: Normal renal ultrasound examination. Electronically Signed   By: David  [place] M.D.   On: 03/05/2017 07:34     CBC  Recent Labs Lab 03/05/17 0456  WBC 6.4  HGB 14.3  HCT 41.4  PLT 210  MCV 93.6  MCH 32.3  MCHC 34.5  RDW 14.1  LYMPHSABS 2.0  MONOABS 0.6  EOSABS 0.1  BASOSABS 0.0    Chemistries   Recent Labs Lab 03/05/17 0456  NA 138  K 3.9  CL 102  CO2 23  GLUCOSE 117*  BUN 37*  CREATININE 3.66*  CALCIUM 9.6  AST 25  ALT 28  ALKPHOS 58  BILITOT 0.8   ------------------------------------------------------------------------------------------------------------------ estimated creatinine clearance is 28.3 mL/min (A) (by C-G formula based on SCr of 3.66 mg/dL (H)). ------------------------------------------------------------------------------------------------------------------ No results for input(s): HGBA1C in the last 72 hours. ------------------------------------------------------------------------------------------------------------------ No results for input(s): CHOL, HDL, LDLCALC, TRIG, CHOLHDL, LDLDIRECT in the last 72 hours. ------------------------------------------------------------------------------------------------------------------ No results for input(s): TSH, T4TOTAL, T3FREE, THYROIDAB in the last 72 hours.  Invalid input(s): FREET3 ------------------------------------------------------------------------------------------------------------------ No results for input(s): VITAMINB12, FOLATE, FERRITIN, TIBC, IRON, RETICCTPCT in the last 72 hours.  Coagulation profile No results for input(s): INR,  PROTIME in the last 168 hours.  No results for input(s): DDIMER in the last 72 hours.  Cardiac Enzymes No results for input(s): CKMB, TROPONINI, MYOGLOBIN in the last 168 hours.  Invalid input(s): CK ------------------------------------------------------------------------------------------------------------------ Invalid input(s): POCBNP    Assessment & Plan   IMPRESSION AND PLAN: 51 year old male patient with history of coronary artery disease, ischemic cardiomyopathy, systolic heart failure, hypertension presented to the emergency room with body aches and decreased urine output.  Admitting diagnosis 1. Acute renal failure in setting of patient being  out in the sun, he is noted to have intermittent hypotension It may be ATN related to hypotension, I will increase his IV fluid rate nephrology has been consult with renal ultrasound is negative 2. Coronary artery disease continue aspirin and Plavix 3. Hypotention stop antihypertensive depending on his blood pressure may need midrdrine I will check a random cortisol 4. Chronic systolic heart failure currently compensated 5. Cardiomyopathy     Code Status Orders        Start     Ordered   03/05/17 0817  Full code  Continuous     03/05/17 0816    Code Status History    Date Active Date Inactive Code Status Order ID Comments User Context   02/20/2017 11:31 PM 02/22/2017  2:05 PM Full Code [id number]  Oralia Manis, MD Inpatient   02/02/2017 12:12 PM 02/04/2017  5:22 PM Full Code [id number]  Ramonita Lab, MD Inpatient   02/02/2017 12:12 PM 02/02/2017 12:12 PM Full Code [id number]  Shaune Pollack, MD Inpatient   12/15/2015  6:51 PM 12/16/2015  2:08 PM Full Code [id number]  Hower, Cletis Athens, MD ED   12/28/2014  1:13 PM 12/28/2014  7:17 PM Full Code [id number]  Alwyn Pea, MD Inpatient   12/27/2014  1:47 PM 12/28/2014  1:13 PM Full Code [id number]  Milagros Loll, MD ED   12/22/2014 10:39 AM 12/23/2014  6:57 PM Full Code [id number]  Gale Journey,  MD Inpatient           Consults   Nephrology  DVT Prophylaxis  heparin for DVT prophylaxis  Lab Results  Component Value Date   PLT 210 03/05/2017     Time Spent in min35 minutes Greater than 50% of time spent in care coordination and counseling patient regarding the condition and plan of care.   Auburn Bilberry M.D on 03/05/2017 at 2:13 PM  Between 7am to 6pm - Pager - [phone]  After 6pm go to [email or web address] - password [email or web address] [facility]  [facility] [place] [facility]   Office  [phone]

## 2017-03-05 NOTE — ED Notes (Signed)
1 

## 2017-03-05 NOTE — H&P (Signed)
[facility] - [place] at [facility]   PATIENT NAME: Bryce [name]    MR#:  [id number]  DATE OF BIRTH:  01-27-1966  DATE OF ADMISSION:  03/05/2017  PRIMARY CARE PHYSICIAN: System, Pcp Not In   REQUESTING/REFERRING PHYSICIAN:   CHIEF COMPLAINT:   Chief Complaint  Patient presents with  . Dehydration  . Headache    HISTORY OF PRESENT ILLNESS: Bryce [name]  is a 51 y.o. male with a known history of Systolic heart failure, coronary artery disease, ischemic cardiomyopathy, acute renal failure in the past presented to the emergency room with decreased urine output since one day. Patient says he did not urinate since last 24 hours. Has generalized weakness. Patient also complains of body aches. He was evaluated in the emergency room bladder scan showed only 20 mL of urine. He appeared dry and dehydrated. IV fluid hydration was started in the emergency room. No complaints of any chest pain, shortness of breath. Workup in the emergency room showed normal CK level. Creatinine has been elevated to more than 3 in the recent blood work. Pending renal ultrasound. Hospitalist service was consulted. No nausea and vomiting.  PAST MEDICAL HISTORY:   Past Medical History:  Diagnosis Date  . Chronic systolic congestive heart failure (HCC)   . Coronary artery disease   . Hypertension   . Ischemic cardiomyopathy   . MI (myocardial infarction) (HCC)     PAST SURGICAL HISTORY: Past Surgical History:  Procedure Laterality Date  . CARDIAC CATHETERIZATION N/A 12/28/2014   Procedure: Left Heart Cath and Coronary Angiography;  Surgeon: Alwyn Pea, MD;  Location: [facility] INVASIVE CV LAB;  Service: Cardiovascular;  Laterality: N/A;  . CORONARY ANGIOPLASTY     Non-ST elevation MI status post stenting within bare-metal stent of the 75% lesion of the LAD, 11/13/2010.   . CORONARY STENT INTERVENTION N/A 02/02/2017   Procedure: CORONARY/GRAFT ACUTE MI REVASCULARIZATION;  Surgeon:  Alwyn Pea, MD;  Location: [facility] INVASIVE CV LAB;  Service: Cardiovascular;  Laterality: N/A;  . LEFT HEART CATH AND CORONARY ANGIOGRAPHY N/A 02/02/2017   Procedure: LEFT HEART CATH AND CORONARY ANGIOGRAPHY;  Surgeon: Alwyn Pea, MD;  Location: [facility] INVASIVE CV LAB;  Service: Cardiovascular;  Laterality: N/A;    SOCIAL HISTORY:  Social History  Substance Use Topics  . Smoking status: Former Smoker    Packs/day: 1.00    Types: Cigarettes  . Smokeless tobacco: Never Used  . Alcohol use 0.6 oz/week    1 Cans of beer per week     Comment: "every now and then"    FAMILY HISTORY:  Family History  Problem Relation Age of Onset  . CAD Father   . Kidney disease Neg Hx   . Diabetes Mellitus II Neg Hx     DRUG ALLERGIES: No Known Allergies  REVIEW OF SYSTEMS:   CONSTITUTIONAL: No fever, has weakness.  myalgias EYES: No blurred or double vision.  EARS, NOSE, AND THROAT: No tinnitus or ear pain.  RESPIRATORY: No cough, shortness of breath, wheezing or hemoptysis.  CARDIOVASCULAR: No chest pain, orthopnea, edema.  GASTROINTESTINAL: No nausea, vomiting, diarrhea or abdominal pain.  GENITOURINARY: No dysuria, hematuria.  ENDOCRINE: No polyuria, nocturia, Has anuria  HEMATOLOGY: No anemia, easy bruising or bleeding SKIN: No rash or lesion. MUSCULOSKELETAL: No joint pain or arthritis.   NEUROLOGIC: No tingling, numbness, weakness.  PSYCHIATRY: No anxiety or depression.   MEDICATIONS AT HOME:  Prior to Admission medications   Medication Sig Start Date End  Date Taking? Authorizing Provider  aspirin EC 81 MG EC tablet Take 1 tablet (81 mg total) by mouth daily. 02/05/17  Yes Katha Hamming, MD  clopidogrel (PLAVIX) 75 MG tablet Take 1 tablet (75 mg total) by mouth daily. 02/05/17  Yes Katha Hamming, MD  lisinopril (PRINIVIL,ZESTRIL) 20 MG tablet Take 20 mg by mouth daily.   Yes [provider]  metoprolol tartrate (LOPRESSOR) 25 MG tablet Take 0.5 tablets  (12.5 mg total) by mouth 2 (two) times daily. 02/22/17  Yes Enid Baas, MD  nitroGLYCERIN (NITROSTAT) 0.4 MG SL tablet Place 1 tablet (0.4 mg total) under the tongue every 5 (five) minutes x 3 doses as needed for chest pain. 02/04/17  Yes Katha Hamming, MD  pantoprazole (PROTONIX) 40 MG tablet Take 1 tablet (40 mg total) by mouth daily. 02/05/17  Yes Katha Hamming, MD  pravastatin (PRAVACHOL) 40 MG tablet Take 40 mg by mouth daily.   Yes [provider]      PHYSICAL EXAMINATION:   VITAL SIGNS: Blood pressure 93/61, pulse (!) 59, temperature 98.2 F (36.8 C), temperature source Oral, resp. rate 19, height  (1.727 m), weight 109.3 kg (241 lb), SpO2 93 %.  GENERAL:  51 y.o.-year-old patient lying in the bed with no acute distress.  EYES: Pupils equal, round, reactive to light and accommodation. No scleral icterus. Extraocular muscles intact.  HEENT: Head atraumatic, normocephalic. Oropharynx dry and nasopharynx clear.  NECK:  Supple, no jugular venous distention. No thyroid enlargement, no tenderness.  LUNGS: Normal breath sounds bilaterally, no wheezing, rales,rhonchi or crepitation. No use of accessory muscles of respiration.  CARDIOVASCULAR: S1, S2 normal. No murmurs, rubs, or gallops.  ABDOMEN: Soft, nontender, nondistended. Bowel sounds present. No organomegaly or mass.  EXTREMITIES: No pedal edema, cyanosis, or clubbing.  NEUROLOGIC: Cranial nerves II through XII are intact. Muscle strength 5/5 in all extremities. Sensation intact. Gait not checked.  PSYCHIATRIC: The patient is alert and oriented x 3.  SKIN: No obvious rash, lesion, or ulcer.   LABORATORY PANEL:   CBC  Recent Labs Lab 03/05/17 0456  WBC 6.4  HGB 14.3  HCT 41.4  PLT 210  MCV 93.6  MCH 32.3  MCHC 34.5  RDW 14.1  LYMPHSABS 2.0  MONOABS 0.6  EOSABS 0.1  BASOSABS 0.0    ------------------------------------------------------------------------------------------------------------------  Chemistries   Recent Labs Lab 03/05/17 0456  NA 138  K 3.9  CL 102  CO2 23  GLUCOSE 117*  BUN 37*  CREATININE 3.66*  CALCIUM 9.6  AST 25  ALT 28  ALKPHOS 58  BILITOT 0.8   ------------------------------------------------------------------------------------------------------------------ estimated creatinine clearance is 28.3 mL/min (A) (by C-G formula based on SCr of 3.66 mg/dL (H)). ------------------------------------------------------------------------------------------------------------------ No results for input(s): TSH, T4TOTAL, T3FREE, THYROIDAB in the last 72 hours.  Invalid input(s): FREET3   Coagulation profile No results for input(s): INR, PROTIME in the last 168 hours. ------------------------------------------------------------------------------------------------------------------- No results for input(s): DDIMER in the last 72 hours. -------------------------------------------------------------------------------------------------------------------  Cardiac Enzymes No results for input(s): CKMB, TROPONINI, MYOGLOBIN in the last 168 hours.  Invalid input(s): CK ------------------------------------------------------------------------------------------------------------------ Invalid input(s): POCBNP  ---------------------------------------------------------------------------------------------------------------  Urinalysis    Component Value Date/Time   COLORURINE AMBER (A) 02/20/2017 2038   APPEARANCEUR CLOUDY (A) 02/20/2017 2038   APPEARANCEUR Clear 06/28/2014 1113   LABSPEC 1.024 02/20/2017 2038   LABSPEC 1.012 06/28/2014 1113   PHURINE 5.0 02/20/2017 2038   GLUCOSEU NEGATIVE 02/20/2017 2038   GLUCOSEU Negative 06/28/2014 1113   HGBUR LARGE (A) 02/20/2017 2038   BILIRUBINUR  SMALL (A) 02/20/2017 2038   BILIRUBINUR Negative  06/28/2014 1113   KETONESUR 5 (A) 02/20/2017 2038   PROTEINUR 100 (A) 02/20/2017 2038   NITRITE NEGATIVE 02/20/2017 2038   LEUKOCYTESUR TRACE (A) 02/20/2017 2038   LEUKOCYTESUR Negative 06/28/2014 1113     RADIOLOGY: No results found.  EKG: Orders placed or performed during the hospital encounter of 02/25/17  . ED EKG  . ED EKG  . EKG 12-Lead  . EKG 12-Lead    IMPRESSION AND PLAN: 51 year old male patient with history of coronary artery disease, ischemic cardiomyopathy, systolic heart failure, hypertension presented to the emergency room with body aches and decreased urine output.  Admitting diagnosis 1. Acute renal failure 2. Coronary artery disease 3. Hypertension 4. Chronic systolic heart failure 5. Cardiomyopathy Treatment plan Admit patient to observation bed IV fluid hydration Follow-up renal function Renal ultrasound Nephrology consultation DVT prophylaxis subcutaneous heparin  All the records are reviewed and case discussed with ED provider. Management plans discussed with the patient, family and they are in agreement.  CODE STATUS:FULL CODE Code Status History    Date Active Date Inactive Code Status Order ID Comments User Context   02/20/2017 11:31 PM 02/22/2017  2:05 PM Full Code [id number]  Oralia Manis, MD Inpatient   02/02/2017 12:12 PM 02/04/2017  5:22 PM Full Code [id number]  Ramonita Lab, MD Inpatient   02/02/2017 12:12 PM 02/02/2017 12:12 PM Full Code [id number]  Shaune Pollack, MD Inpatient   12/15/2015  6:51 PM 12/16/2015  2:08 PM Full Code [id number]  Hower, Cletis [place], MD ED   12/28/2014  1:13 PM 12/28/2014  7:17 PM Full Code [id number]  Alwyn Pea, MD Inpatient   12/27/2014  1:47 PM 12/28/2014  1:13 PM Full Code [id number]  Milagros Loll, MD ED   12/22/2014 10:39 AM 12/23/2014  6:57 PM Full Code [id number]  Gale Journey, MD Inpatient       TOTAL TIME TAKING CARE OF THIS PATIENT: 51 minutes.    Ihor Austin M.D on 03/05/2017 at 6:21 AM  Between 7am to  6pm - Pager - [phone]  After 6pm go to [email or web address] - password [email or web address]  [facility]  Office  [phone]  CC: Primary care physician; System, Pcp Not In

## 2017-03-05 NOTE — ED Provider Notes (Signed)
[facility] Emergency Department Provider Note    First MD Initiated Contact with Patient 03/05/17 0505     (approximate)  I have reviewed the triage vital signs and the nursing notes.   HISTORY  Chief Complaint Dehydration and Headache    HPI Bryce [name] is a 51 y.o. male with a history of ischemic cardiomyopathy with congestive heart failure recent admission to hospital for heart attack as well as renal failure. Patient presents to the ER with chief complaint of diffuse myalgias and muscle cramps with no urine output in 24 hours. Patient works as a Writer states that she's been trying to drink plenty of fluids but feels that he's been over heating. States that he did recently stop his lisinopril but he still taking Protonix. Denies any fevers or chest pain. Also complaining of mild headache right now. No numbness or tingling.   Past Medical History:  Diagnosis Date  . Chronic systolic congestive heart failure (HCC)   . Coronary artery disease   . Hypertension   . Ischemic cardiomyopathy   . MI (myocardial infarction) (HCC)    Family History  Problem Relation Age of Onset  . CAD Father   . Kidney disease Neg Hx   . Diabetes Mellitus II Neg Hx    Past Surgical History:  Procedure Laterality Date  . CARDIAC CATHETERIZATION N/A 12/28/2014   Procedure: Left Heart Cath and Coronary Angiography;  Surgeon: Alwyn Pea, MD;  Location: [facility] INVASIVE CV LAB;  Service: Cardiovascular;  Laterality: N/A;  . CORONARY ANGIOPLASTY     Non-ST elevation MI status post stenting within bare-metal stent of the 75% lesion of the LAD, 11/13/2010.   . CORONARY STENT INTERVENTION N/A 02/02/2017   Procedure: CORONARY/GRAFT ACUTE MI REVASCULARIZATION;  Surgeon: Alwyn Pea, MD;  Location: [facility] INVASIVE CV LAB;  Service: Cardiovascular;  Laterality: N/A;  . LEFT HEART CATH AND CORONARY ANGIOGRAPHY N/A 02/02/2017   Procedure: LEFT HEART CATH AND CORONARY  ANGIOGRAPHY;  Surgeon: Alwyn Pea, MD;  Location: [facility] INVASIVE CV LAB;  Service: Cardiovascular;  Laterality: N/A;   Patient Active Problem List   Diagnosis Date Noted  . CAD (coronary artery disease) 02/20/2017  . Dehydration 02/20/2017  . STEMI (ST elevation myocardial infarction) (HCC) 02/02/2017  . AKI (acute kidney injury) (HCC) 12/15/2015  . Solitary pulmonary nodule 12/23/2014  . Chest pain 12/22/2014  . Essential hypertension 12/22/2014  . Hyperlipidemia 12/22/2014      Prior to Admission medications   Medication Sig Start Date End Date Taking? Authorizing Provider  aspirin EC 81 MG EC tablet Take 1 tablet (81 mg total) by mouth daily. 02/05/17  Yes Katha Hamming, MD  clopidogrel (PLAVIX) 75 MG tablet Take 1 tablet (75 mg total) by mouth daily. 02/05/17  Yes Katha Hamming, MD  lisinopril (PRINIVIL,ZESTRIL) 20 MG tablet Take 20 mg by mouth daily.   Yes [provider]  metoprolol tartrate (LOPRESSOR) 25 MG tablet Take 0.5 tablets (12.5 mg total) by mouth 2 (two) times daily. 02/22/17  Yes Enid Baas, MD  nitroGLYCERIN (NITROSTAT) 0.4 MG SL tablet Place 1 tablet (0.4 mg total) under the tongue every 5 (five) minutes x 3 doses as needed for chest pain. 02/04/17  Yes Katha Hamming, MD  pantoprazole (PROTONIX) 40 MG tablet Take 1 tablet (40 mg total) by mouth daily. 02/05/17  Yes Katha Hamming, MD  pravastatin (PRAVACHOL) 40 MG tablet Take 40 mg by mouth daily.   Yes [provider]  Allergies Patient has no known allergies.    Social History Social History  Substance Use Topics  . Smoking status: Former Smoker    Packs/day: 1.00    Types: Cigarettes  . Smokeless tobacco: Never Used  . Alcohol use 0.6 oz/week    1 Cans of beer per week     Comment: "every now and then"    Review of Systems Patient denies headaches, rhinorrhea, blurry vision, numbness, shortness of breath, chest pain, edema, cough, abdominal pain,  nausea, vomiting, diarrhea, dysuria, fevers, rashes or hallucinations unless otherwise stated above in HPI. ____________________________________________   PHYSICAL EXAM:  VITAL SIGNS: Vitals:   03/05/17 0515 03/05/17 0600  BP: 104/68 93/61  Pulse: 68 (!) 59  Resp: 12 19  Temp:    SpO2: 94% 93%    Constitutional: Alert and oriented. Well appearing and in no acute distress. Eyes: Conjunctivae are normal.  Head: Atraumatic. Nose: No congestion/rhinnorhea. Mouth/Throat: Mucous membranes are moist.   Neck: No stridor. Painless ROM.  Cardiovascular: Normal rate, regular rhythm. Grossly normal heart sounds.  Good peripheral circulation. Respiratory: Normal respiratory effort.  No retractions. Lungs CTAB. Gastrointestinal: Soft and nontender. No distention. No abdominal bruits. No CVA tenderness. Musculoskeletal: No lower extremity tenderness nor edema.  No joint effusions. Neurologic:  Normal speech and language. No gross focal neurologic deficits are appreciated. No facial droop Skin:  Skin is warm, dry and intact. No rash noted. Psychiatric: Mood and affect are normal. Speech and behavior are normal.  ____________________________________________   LABS (all labs ordered are listed, but only abnormal results are displayed)  Results for orders placed or performed during the hospital encounter of 03/05/17 (from the past 24 hour(s))  Comprehensive metabolic panel     Status: Abnormal   Collection Time: 03/05/17  4:56 AM  Result Value Ref Range   Sodium 138 135 - 145 mmol/L   Potassium 3.9 3.5 - 5.1 mmol/L   Chloride 102 101 - 111 mmol/L   CO2 23 22 - 32 mmol/L   Glucose, Bld 117 (H) 65 - 99 mg/dL   BUN 37 (H) 6 - 20 mg/dL   Creatinine, Ser 1.61 (H) 0.61 - 1.24 mg/dL   Calcium 9.6 8.9 - 09.6 mg/dL   Total Protein 7.8 6.5 - 8.1 g/dL   Albumin 4.6 3.5 - 5.0 g/dL   AST 25 15 - 41 U/L   ALT 28 17 - 63 U/L   Alkaline Phosphatase 58 38 - 126 U/L   Total Bilirubin 0.8 0.3 - 1.2  mg/dL   GFR calc non Af Amer 18 (L) >60 mL/min   GFR calc Af Amer 20 (L) >60 mL/min   Anion gap 13 5 - 15  CBC with Differential (PNL)     Status: None   Collection Time: 03/05/17  4:56 AM  Result Value Ref Range   WBC 6.4 3.8 - 10.6 K/uL   RBC 4.43 4.40 - 5.90 MIL/uL   Hemoglobin 14.3 13.0 - 18.0 g/dL   HCT 04.5 40.9 - 81.1 %   MCV 93.6 80.0 - 100.0 fL   MCH 32.3 26.0 - 34.0 pg   MCHC 34.5 32.0 - 36.0 g/dL   RDW 91.4 78.2 - 95.6 %   Platelets 210 150 - 440 K/uL   Neutrophils Relative % 58 %   Neutro Abs 3.7 1.4 - 6.5 K/uL   Lymphocytes Relative 31 %   Lymphs Abs 2.0 1.0 - 3.6 K/uL   Monocytes Relative 9 %   Monocytes Absolute  0.6 0.2 - 1.0 K/uL   Eosinophils Relative 1 %   Eosinophils Absolute 0.1 0 - 0.7 K/uL   Basophils Relative 1 %   Basophils Absolute 0.0 0 - 0.1 K/uL  CK     Status: None   Collection Time: 03/05/17  4:56 AM  Result Value Ref Range   Total CK 233 49 - 397 U/L   ____________________________________________ ____________________________________________  RADIOLOGY   ____________________________________________   PROCEDURES  Procedure(s) performed:  Procedures    Critical Care performed: no ____________________________________________   INITIAL IMPRESSION / ASSESSMENT AND PLAN / ED COURSE  Pertinent labs & imaging results that were available during my care of the patient were reviewed by me and considered in my medical decision making (see chart for details).  DDX: aki, rhabdo, oliguria, dehydration  MERVILLE HIJAZI is a 51 y.o. who presents to the ED with chief complaint of muscle aches and headache with decreased urine output as described above. Patient with complex recent medical history given heart attacks in history of present illness. Blood work does show evidence of significant AK I unfortunately no evidence of hyperkalemia or acidosis. CK is mildly elevated but not dangerously so. Based on his presentation I am concerned for profound  dehydration resulting in renal injury. She'll be given IV fluids. Based on his history of heart failure patient will require admission for slow IV rehydration and further reassessment and evaluation.  Have discussed with the patient and available family all diagnostics and treatments performed thus far and all questions were answered to the best of my ability. The patient demonstrates understanding and agreement with plan.       ____________________________________________   FINAL CLINICAL IMPRESSION(S) / ED DIAGNOSES  Final diagnoses:  Acute nonintractable headache, unspecified headache type  AKI (acute kidney injury) (HCC)  Anuria and oliguria      NEW MEDICATIONS STARTED DURING THIS VISIT:  New Prescriptions   No medications on file     Note:  This document was prepared using Dragon voice recognition software and may include unintentional dictation errors.    Willy Eddy, MD 03/05/17 [id number]

## 2017-03-05 NOTE — ED Notes (Signed)
Patient returned from cath lab. Will continue to monitor.

## 2017-03-06 LAB — BASIC METABOLIC PANEL
Anion gap: 6 (ref 5–15)
BUN: 30 mg/dL — AB (ref 6–20)
CHLORIDE: 108 mmol/L (ref 101–111)
CO2: 26 mmol/L (ref 22–32)
Calcium: 8.9 mg/dL (ref 8.9–10.3)
Creatinine, Ser: 1.45 mg/dL — ABNORMAL HIGH (ref 0.61–1.24)
GFR calc Af Amer: >60 mL/min (ref >60)
GFR calc non Af Amer: 54 mL/min — ABNORMAL LOW (ref >60)
GLUCOSE: 99 mg/dL (ref 65–99)
POTASSIUM: 4.1 mmol/L (ref 3.5–5.1)
SODIUM: 140 mmol/L (ref 135–145)

## 2017-03-06 LAB — CBC
HEMATOCRIT: 38.5 % — AB (ref 40.0–52.0)
HEMOGLOBIN: 13.3 g/dL (ref 13.0–18.0)
MCH: 32.7 pg (ref 26.0–34.0)
MCHC: 34.5 g/dL (ref 32.0–36.0)
MCV: 95 fL (ref 80.0–100.0)
Platelets: 172 10*3/uL (ref 150–440)
RBC: 4.05 MIL/uL — AB (ref 4.40–5.90)
RDW: 14.3 % (ref 11.5–14.5)
WBC: 4.1 10*3/uL (ref 3.8–10.6)

## 2017-03-06 NOTE — Discharge Summary (Signed)
[facility] [place] at [facility], [place] y.o., DOB 25-Jan-1966, MRN [id number]. Admission date: 03/05/2017 Discharge Date 03/06/2017 Primary MD System, Pcp Not In Admitting Physician Ihor Austin, MD  Admission Diagnosis  Anuria and oliguria [R34] AKI (acute kidney injury) (HCC) [N17.9] Acute nonintractable headache, unspecified headache type [R51]  Discharge Diagnosis   Active Problems:   Acute renal failure   Hypotension   Coronary artery disease   Chronic systolic heart failure   Cardiomyopathy     Hospital Course  Jo Cerone  is a 51 y.o. male with a known history of Systolic heart failure, coronary artery disease, ischemic cardiomyopathy, acute renal failure in the past presented to the emergency room with decreased urine output since one day. Patient was noted to have acute renal failure and was admitted for IV fluids. Patient had similar presentation to previously. He was noted to have hypotension and was given IV fluids. Patient's renal function improved. He was also noticed to have intermittent hypotension his antihypertensives has been discontinued. I recommend that he follow-up with his cardiologist and keep a log of his blood pressure. His cortisol level was 6 feet continues to have trouble with hypotension he will need to be worked up for adrenal insufficiency. Patient feeling well denies any complaints.           Consults  nephrology  Significant Tests:  See full reports for all details     US Renal  Result Date: 03/05/2017 CLINICAL DATA:  Acute renal insufficiency. Acute left flank and back pain for the past 2 days. Coronary artery disease. EXAM: RENAL / URINARY TRACT ULTRASOUND COMPLETE COMPARISON:  CT scan of the abdomen and pelvis of June 28, 2014 FINDINGS: Right Kidney: Length: 12.2 cm. Echogenicity within normal limits. No mass or hydronephrosis visualized. Left Kidney: Length: 13.7 cm. Echogenicity within normal limits.  No mass or hydronephrosis visualized. Bladder: Appears normal for degree of bladder distention. Bilateral ureteral jets are observed. IMPRESSION: Normal renal ultrasound examination. Electronically Signed   By: David  [place] M.D.   On: 03/05/2017 07:34       Today   Subjective:   Bryce [name]  patient feeling well denies any complaints  Objective:   Blood pressure 124/78, pulse 69, temperature 98 F (36.7 C), temperature source Oral, resp. rate 16, height  (1.727 m), weight 242 lb 11.2 oz (110.1 kg), SpO2 97 %.  .  Intake/Output Summary (Last 24 hours) at 03/06/17 1330 Last data filed at 03/06/17 1203  Gross per 24 hour  Intake          1814.17 ml  Output             2950 ml  Net         -1135.83 ml    Exam VITAL SIGNS: Blood pressure 124/78, pulse 69, temperature 98 F (36.7 C), temperature source Oral, resp. rate 16, height  (1.727 m), weight 242 lb 11.2 oz (110.1 kg), SpO2 97 %.  GENERAL:  51 y.o.-year-old patient lying in the bed with no acute distress.  EYES: Pupils equal, round, reactive to light and accommodation. No scleral icterus. Extraocular muscles intact.  HEENT: Head atraumatic, normocephalic. Oropharynx and nasopharynx clear.  NECK:  Supple, no jugular venous distention. No thyroid enlargement, no tenderness.  LUNGS: Normal breath sounds bilaterally, no wheezing, rales,rhonchi or crepitation. No use of accessory muscles of respiration.  CARDIOVASCULAR: S1, S2 normal. No murmurs, rubs, or gallops.  ABDOMEN: Soft, nontender, nondistended. Bowel  sounds present. No organomegaly or mass.  EXTREMITIES: No pedal edema, cyanosis, or clubbing.  NEUROLOGIC: Cranial nerves II through XII are intact. Muscle strength 5/5 in all extremities. Sensation intact. Gait not checked.  PSYCHIATRIC: The patient is alert and oriented x 3.  SKIN: No obvious rash, lesion, or ulcer.   Data Review     CBC w Diff: Lab Results  Component Value Date   WBC 4.1 03/06/2017    HGB 13.3 03/06/2017   HGB 14.2 06/28/2014   HCT 38.5 (L) 03/06/2017   HCT 42.9 06/28/2014   PLT 172 03/06/2017   PLT 209 06/28/2014   LYMPHOPCT 31 03/05/2017   LYMPHOPCT 27.0 06/28/2014   MONOPCT 9 03/05/2017   MONOPCT 8.8 06/28/2014   EOSPCT 1 03/05/2017   EOSPCT 1.0 06/28/2014   BASOPCT 1 03/05/2017   BASOPCT 0.7 06/28/2014   CMP: Lab Results  Component Value Date   NA 140 03/06/2017   NA 140 06/28/2014   K 4.1 03/06/2017   K 4.5 06/28/2014   CL 108 03/06/2017   CL 109 (H) 06/28/2014   CO2 26 03/06/2017   CO2 27 06/28/2014   BUN 30 (H) 03/06/2017   BUN 11 06/28/2014   CREATININE 1.45 (H) 03/06/2017   CREATININE 0.90 06/28/2014   PROT 7.8 03/05/2017   PROT 6.8 06/28/2014   ALBUMIN 4.6 03/05/2017   ALBUMIN 3.6 06/28/2014   BILITOT 0.8 03/05/2017   BILITOT 0.5 06/28/2014   ALKPHOS 58 03/05/2017   ALKPHOS 67 06/28/2014   AST 25 03/05/2017   AST 29 06/28/2014   ALT 28 03/05/2017   ALT 34 06/28/2014  .  Micro Results No results found for this or any previous visit (from the past 240 hour(s)).      Code Status Orders        Start     Ordered   03/05/17 0817  Full code  Continuous     03/05/17 0816    Code Status History    Date Active Date Inactive Code Status Order ID Comments User Context   02/20/2017 11:31 PM 02/22/2017  2:05 PM Full Code [id number]  Oralia Manis, MD Inpatient   02/02/2017 12:12 PM 02/04/2017  5:22 PM Full Code [id number]  Ramonita Lab, MD Inpatient   02/02/2017 12:12 PM 02/02/2017 12:12 PM Full Code [id number]  Shaune Pollack, MD Inpatient   12/15/2015  6:51 PM 12/16/2015  2:08 PM Full Code [id number]  Hower, Cletis Athens, MD ED   12/28/2014  1:13 PM 12/28/2014  7:17 PM Full Code [id number]  Alwyn Pea, MD Inpatient   12/27/2014  1:47 PM 12/28/2014  1:13 PM Full Code [id number]  Milagros Loll, MD ED   12/22/2014 10:39 AM 12/23/2014  6:57 PM Full Code [id number]  Gale Journey, MD Inpatient          Follow-up Information    Mosetta Pigeon, MD  Follow up in 5 day(s).   Specialty:  Internal Medicine Why:  f/u bmp and renal function Contact information: [address] [place] [address] [phone]        primary cardiologist Follow up in 1 week(s).   Why:  f/u bp chf           Discharge Medications   Allergies as of 03/06/2017   No Known Allergies     Medication List    STOP taking these medications   lisinopril 20 MG tablet Commonly known as:  PRINIVIL,ZESTRIL   metoprolol tartrate 25 MG tablet Commonly  known as:  LOPRESSOR     TAKE these medications   aspirin 81 MG EC tablet Take 1 tablet (81 mg total) by mouth daily.   clopidogrel 75 MG tablet Commonly known as:  PLAVIX Take 1 tablet (75 mg total) by mouth daily.   nitroGLYCERIN 0.4 MG SL tablet Commonly known as:  NITROSTAT Place 1 tablet (0.4 mg total) under the tongue every 5 (five) minutes x 3 doses as needed for chest pain.   pantoprazole 40 MG tablet Commonly known as:  PROTONIX Take 1 tablet (40 mg total) by mouth daily.   pravastatin 40 MG tablet Commonly known as:  PRAVACHOL Take 40 mg by mouth daily.          Total Time in preparing paper work, data evaluation and todays exam - 35 minutes  Auburn Bilberry M.D on 03/06/2017 at 1:30 [facility]  [facility]   Office  [phone]

## 2017-03-06 NOTE — Discharge Instructions (Addendum)
[facility] - [place] at [facility]  DIET:  Cardiac diet  DISCHARGE CONDITION:  Stable  ACTIVITY:  Activity as tolerated  OXYGEN:  Home Oxygen: No.   Oxygen Delivery: room air  DISCHARGE LOCATION:  home    ADDITIONAL DISCHARGE INSTRUCTION: keep log of bp at home to take to cardoligist  If you experience worsening of your admission symptoms, develop shortness of breath, life threatening emergency, suicidal or homicidal thoughts you must seek medical attention immediately by calling 911 or calling your MD immediately  if symptoms less severe.  You Must read complete instructions/literature along with all the possible adverse reactions/side effects for all the Medicines you take and that have been prescribed to you. Take any new Medicines after you have completely understood and accpet all the possible adverse reactions/side effects.   Please note  You were cared for by a hospitalist during your hospital stay. If you have any questions about your discharge medications or the care you received while you were in the hospital after you are discharged, you can call the unit and asked to speak with the hospitalist on call if the hospitalist that took care of you is not available. Once you are discharged, your primary care physician will handle any further medical issues. Please note that NO REFILLS for any discharge medications will be authorized once you are discharged, as it is imperative that you return to your primary care physician (or establish a relationship with a primary care physician if you do not have one) for your aftercare needs so that they can reassess your need for medications and monitor your lab values.

## 2017-03-06 NOTE — Care Management (Signed)
Discussed during progression to notify CM of any new discharge meds so can make sure med is on [facility] formulary.  Informed no new meds.

## 2017-03-10 ENCOUNTER — Ambulatory Visit: Payer: Self-pay | Admitting: Pharmacist

## 2017-03-10 VITALS — BP 124/74 | Wt 244.0 lb

## 2017-03-10 DIAGNOSIS — Z79899 Other long term (current) drug therapy: Secondary | ICD-10-CM

## 2017-03-10 NOTE — Patient Instructions (Signed)
Follow-up with medication management clinic in 6 months.   Pick up new scale. Weigh every day in the morning. Call doctor's office if gain >3 pounds in 1 day or >5 pounds in 1 week.   Continue to drink Pedialyte to prevent dehydration.

## 2017-03-10 NOTE — Progress Notes (Signed)
[facility] Visit Note  Patient: Bryce [name] MRN: [id number] Date of Birth: November 19, 1965 PCP: System, Pcp Not In   Bryce [name] 51 y.o. male presents for a follow up visit with the pharmacist at medication management clinic today.   BP 124/74   Wt 244 lb (110.7 kg)   BMI 37.10 kg/m   Patient Information   Past Medical History:  Diagnosis Date  . Chronic systolic congestive heart failure (HCC)   . Coronary artery disease   . Hypertension   . Ischemic cardiomyopathy   . MI (myocardial infarction) [facility])       Past Surgical History:  Procedure Laterality Date  . CARDIAC CATHETERIZATION N/A 12/28/2014   Procedure: Left Heart Cath and Coronary Angiography;  Surgeon: Alwyn Pea, MD;  Location: [facility] INVASIVE CV LAB;  Service: Cardiovascular;  Laterality: N/A;  . CORONARY ANGIOPLASTY     Non-ST elevation MI status post stenting within bare-metal stent of the 75% lesion of the LAD, 11/13/2010.   . CORONARY STENT INTERVENTION N/A 02/02/2017   Procedure: CORONARY/GRAFT ACUTE MI REVASCULARIZATION;  Surgeon: Alwyn Pea, MD;  Location: [facility] INVASIVE CV LAB;  Service: Cardiovascular;  Laterality: N/A;  . LEFT HEART CATH AND CORONARY ANGIOGRAPHY N/A 02/02/2017   Procedure: LEFT HEART CATH AND CORONARY ANGIOGRAPHY;  Surgeon: Alwyn Pea, MD;  Location: [facility] INVASIVE CV LAB;  Service: Cardiovascular;  Laterality: N/A;     Family History  Problem Relation Age of Onset  . CAD Father   . Kidney disease Neg Hx   . Diabetes Mellitus II Neg Hx     New Diagnoses (since last visit): CHF       History  Alcohol Use No      History  Smoking Status  . Former Smoker  . Packs/day: 1.00  . Years: 5.00  . Types: Cigarettes  . Quit date: 01/05/2017  Smokeless Tobacco  . Never Used      Health Maintenance  Topic Date Due  . TETANUS/TDAP  10/11/1983  . COLONOSCOPY  10/11/2014  . INFLUENZA VACCINE  01/01/2017  . HIV Screening  Completed      Assessment and Plan:  1. Dehydration  - Recently hospitalized (10/3-10/4) due to acute renal failure secondary to dehydration - lisinopril and metoprolol was stopped during hospitalization. Has recently restarted lisinopril and a lower dose of metoprolol. Will follow up with cardiologist regarding possibly titrating metoprolol back up to dose he was taking before most recent hospitalization.  Has a follow up appointment with cardiologist in ~1 month and follow up appointment with nephrologist in ~1 weeks.  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    2. Systolic HF  - Most recent ECHO (02/03/17) shows EF 45-50%  - Counseled on importance of daily weights; Will pick up new scale because current scale is broken. Instructed to call doctor's office with a weight increase >3 pounds in a day or >5 pounds in 1 week.  - Counseled on the importance of not adding salt to foods and choosing lower sodium options. Patient states he uses Mrs. Dash and has been choosing healthier options (salad v cheeseburger) and has lost about 11 pounds in the last month.   - Reviewed how to use NTG when experiencing chest pain.   3. Hypertension - Controlled; BP today 124/74 - Continue current medications   Will follow up with patient in  6 months or sooner if any questions/concerns come up .   Bryce [name], PharmD Pharmacy Resident  03/10/17

## 2017-07-18 ENCOUNTER — Encounter: Payer: Self-pay | Admitting: Physician Assistant

## 2017-07-18 ENCOUNTER — Emergency Department: Payer: No Typology Code available for payment source

## 2017-07-18 ENCOUNTER — Emergency Department
Admission: EM | Admit: 2017-07-18 | Discharge: 2017-07-18 | Disposition: A | Discharge status: Home / Self Care | Payer: No Typology Code available for payment source | Attending: Emergency Medicine | Admitting: Emergency Medicine

## 2017-07-18 ENCOUNTER — Other Ambulatory Visit: Payer: Self-pay

## 2017-07-18 DIAGNOSIS — I5022 Chronic systolic (congestive) heart failure: Secondary | ICD-10-CM | POA: Diagnosis not present

## 2017-07-18 DIAGNOSIS — I252 Old myocardial infarction: Secondary | ICD-10-CM | POA: Insufficient documentation

## 2017-07-18 DIAGNOSIS — Y92410 Unspecified street and highway as the place of occurrence of the external cause: Secondary | ICD-10-CM | POA: Diagnosis not present

## 2017-07-18 DIAGNOSIS — Z7982 Long term (current) use of aspirin: Secondary | ICD-10-CM | POA: Insufficient documentation

## 2017-07-18 DIAGNOSIS — Z79899 Other long term (current) drug therapy: Secondary | ICD-10-CM | POA: Diagnosis not present

## 2017-07-18 DIAGNOSIS — Y9389 Activity, other specified: Secondary | ICD-10-CM | POA: Diagnosis not present

## 2017-07-18 DIAGNOSIS — S3992XA Unspecified injury of lower back, initial encounter: Secondary | ICD-10-CM | POA: Diagnosis present

## 2017-07-18 DIAGNOSIS — S39012A Strain of muscle, fascia and tendon of lower back, initial encounter: Secondary | ICD-10-CM | POA: Diagnosis not present

## 2017-07-18 DIAGNOSIS — Z7902 Long term (current) use of antithrombotics/antiplatelets: Secondary | ICD-10-CM | POA: Insufficient documentation

## 2017-07-18 DIAGNOSIS — I110 Hypertensive heart disease with heart failure: Secondary | ICD-10-CM | POA: Diagnosis not present

## 2017-07-18 DIAGNOSIS — Z9861 Coronary angioplasty status: Secondary | ICD-10-CM | POA: Diagnosis not present

## 2017-07-18 DIAGNOSIS — Z87891 Personal history of nicotine dependence: Secondary | ICD-10-CM | POA: Diagnosis not present

## 2017-07-18 DIAGNOSIS — I2510 Atherosclerotic heart disease of native coronary artery without angina pectoris: Secondary | ICD-10-CM | POA: Insufficient documentation

## 2017-07-18 DIAGNOSIS — Y998 Other external cause status: Secondary | ICD-10-CM | POA: Diagnosis not present

## 2017-07-18 MED ORDER — CYCLOBENZAPRINE HCL 5 MG PO TABS: 5.0000 mg | ORAL_TABLET | Freq: Three times a day (TID) | ORAL | 0 refills | Status: DC | PRN

## 2017-07-18 MED ORDER — CYCLOBENZAPRINE HCL 10 MG PO TABS
10.0000 mg | ORAL_TABLET | Freq: Once | ORAL | Status: AC
  Administered 2017-07-18: 10 mg via ORAL
  Filled 2017-07-18: qty 1
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
## 2017-07-23 ENCOUNTER — Encounter: Payer: Self-pay | Admitting: Intensive Care

## 2017-07-23 ENCOUNTER — Emergency Department
Admission: EM | Admit: 2017-07-23 | Discharge: 2017-07-23 | Disposition: A | Discharge status: Home / Self Care | Payer: No Typology Code available for payment source | Attending: Emergency Medicine | Admitting: Emergency Medicine

## 2017-07-23 ENCOUNTER — Emergency Department: Payer: No Typology Code available for payment source

## 2017-07-23 DIAGNOSIS — Y92410 Unspecified street and highway as the place of occurrence of the external cause: Secondary | ICD-10-CM | POA: Insufficient documentation

## 2017-07-23 DIAGNOSIS — Y9389 Activity, other specified: Secondary | ICD-10-CM | POA: Diagnosis not present

## 2017-07-23 DIAGNOSIS — M545 Low back pain: Secondary | ICD-10-CM | POA: Diagnosis present

## 2017-07-23 DIAGNOSIS — M5136 Other intervertebral disc degeneration, lumbar region: Secondary | ICD-10-CM | POA: Diagnosis not present

## 2017-07-23 DIAGNOSIS — M5416 Radiculopathy, lumbar region: Secondary | ICD-10-CM | POA: Diagnosis not present

## 2017-07-23 DIAGNOSIS — Y999 Unspecified external cause status: Secondary | ICD-10-CM | POA: Diagnosis not present

## 2017-07-23 MED ORDER — PREDNISONE 10 MG PO TABS: 10.0000 mg | ORAL_TABLET | Freq: Every day | ORAL | 0 refills | Status: DC

## 2017-07-23 MED ORDER — MELOXICAM 15 MG PO TABS: 15.0000 mg | ORAL_TABLET | Freq: Every day | ORAL | 0 refills | Status: DC

## 2017-07-23 NOTE — ED Triage Notes (Signed)
[facility] and d/c home. Patient is back due to lower back pain that is still causing him pain.
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
## 2017-07-23 NOTE — ED Notes (Addendum)
Pt. Verbalizes understanding of d/c instructions, medications, and follow-up. VS stable.  Pt. In NAD at time of d/c and denies further concerns regarding this visit. Pt. Stable at the time of departure from the unit, departing unit by the safest and most appropriate manner per that pt condition and limitations with all belongings accounted for. Pt advised to return to the ED at any time for emergent concerns, or for new/worsening symptoms.   

## 2017-09-04 ENCOUNTER — Telehealth: Payer: Self-pay | Admitting: Pharmacy Technician

## 2017-09-04 NOTE — Telephone Encounter (Signed)
Received updated proof of income.  Patient eligible to receive medication assistance at [facility] through 2019, as long as eligibility requirements continue to be met.  [facility]

## 2017-09-09 ENCOUNTER — Encounter: Payer: Self-pay | Admitting: Emergency Medicine

## 2017-09-09 ENCOUNTER — Other Ambulatory Visit: Payer: Self-pay

## 2017-09-09 ENCOUNTER — Emergency Department
Admission: EM | Admit: 2017-09-09 | Discharge: 2017-09-09 | Disposition: A | Discharge status: Home / Self Care | Payer: Self-pay | Attending: Emergency Medicine | Admitting: Emergency Medicine

## 2017-09-09 DIAGNOSIS — Z7982 Long term (current) use of aspirin: Secondary | ICD-10-CM | POA: Insufficient documentation

## 2017-09-09 DIAGNOSIS — M5432 Sciatica, left side: Secondary | ICD-10-CM | POA: Insufficient documentation

## 2017-09-09 DIAGNOSIS — Z87891 Personal history of nicotine dependence: Secondary | ICD-10-CM | POA: Insufficient documentation

## 2017-09-09 DIAGNOSIS — I10 Essential (primary) hypertension: Secondary | ICD-10-CM | POA: Insufficient documentation

## 2017-09-09 DIAGNOSIS — M5431 Sciatica, right side: Secondary | ICD-10-CM

## 2017-09-09 DIAGNOSIS — I2510 Atherosclerotic heart disease of native coronary artery without angina pectoris: Secondary | ICD-10-CM | POA: Insufficient documentation

## 2017-09-09 DIAGNOSIS — Z79899 Other long term (current) drug therapy: Secondary | ICD-10-CM | POA: Insufficient documentation

## 2017-09-09 MED ORDER — PREDNISONE 10 MG PO TABS: ORAL_TABLET | ORAL | 0 refills | Status: DC

## 2017-09-09 MED ORDER — MELOXICAM 15 MG PO TABS: 15.0000 mg | ORAL_TABLET | Freq: Every day | ORAL | 2 refills | Status: DC

## 2017-09-09 NOTE — ED Notes (Signed)
See triage note  Presents with lower back pain which is radiating into right leg  States pain started about 2 days ago states he has had sciatica in the past ..ambulates well to treatment room

## 2017-09-09 NOTE — ED Notes (Signed)
Pt verbalized understanding of discharge instructions. NAD at this time. 

## 2017-09-09 NOTE — Discharge Instructions (Addendum)
[facility]  Begin taking prednisone as directed.  After you have finished this begin taking meloxicam 15 mg 1 daily with food. You may use ice or heat to your back as needed.  Also use 2 pillows under your knees when sleeping on your back or use one pillow between your knees if sleeping on your side.

## 2017-09-09 NOTE — ED Triage Notes (Signed)
Pt to ED from home c/o lower back pain with pain radiating down right leg.  States pain ongoing for a long time, MRI done 2 weeks ago and told he has degenerative disc disease, unable to sleep past couple days and needs relief.  Pt ambulatory to triage with steady gait. Denies urinary changes.

## 2017-09-09 NOTE — ED Provider Notes (Signed)
[facility] Emergency Department Provider Note  ____________________________________________   First MD Initiated Contact with Patient 09/09/17 1234     (approximate)  I have reviewed the triage vital signs and the nursing notes.   HISTORY  Chief Complaint Back Pain and Sciatica   HPI Bryce [name] is a 52 y.o. male is here with complaint of low back pain with radiation into his right leg.  Patient denies any history of injury.  He denies any incontinence of bowel or bladder.  Patient continues to ambulate without assistance.  Patient states that he has had ongoing back issues and has already had an MRI done which showed degenerative disc disease.  The last couple days he has taken over-the-counter medication without relief of his pain.  This is making it difficult for him to sleep.  Has a history of taking prednisone in the past which has given him relief for short periods of time.  He rates his pain as a 10/10.   Past Medical History:  Diagnosis Date  . Chronic systolic congestive heart failure (HCC)   . Coronary artery disease   . Hypertension   . Ischemic cardiomyopathy   . MI (myocardial infarction) [facility])     Patient Active Problem List   Diagnosis Date Noted  . CAD (coronary artery disease) 02/20/2017  . Dehydration 02/20/2017  . STEMI (ST elevation myocardial infarction) (HCC) 02/02/2017  . AKI (acute kidney injury) (HCC) 12/15/2015  . Solitary pulmonary nodule 12/23/2014  . Chest pain 12/22/2014  . Essential hypertension 12/22/2014  . Hyperlipidemia 12/22/2014    Past Surgical History:  Procedure Laterality Date  . CARDIAC CATHETERIZATION N/A 12/28/2014   Procedure: Left Heart Cath and Coronary Angiography;  Surgeon: Alwyn Pea, MD;  Location: [facility] INVASIVE CV LAB;  Service: Cardiovascular;  Laterality: N/A;  . CORONARY ANGIOPLASTY     Non-ST elevation MI status post stenting within bare-metal stent of the 75% lesion of the LAD,  11/13/2010.   . CORONARY STENT INTERVENTION N/A 02/02/2017   Procedure: CORONARY/GRAFT ACUTE MI REVASCULARIZATION;  Surgeon: Alwyn Pea, MD;  Location: [facility] INVASIVE CV LAB;  Service: Cardiovascular;  Laterality: N/A;  . LEFT HEART CATH AND CORONARY ANGIOGRAPHY N/A 02/02/2017   Procedure: LEFT HEART CATH AND CORONARY ANGIOGRAPHY;  Surgeon: Alwyn Pea, MD;  Location: [facility] INVASIVE CV LAB;  Service: Cardiovascular;  Laterality: N/A;    Prior to Admission medications   Medication Sig Start Date End Date Taking? Authorizing Provider  aspirin EC 81 MG EC tablet Take 1 tablet (81 mg total) by mouth daily. 02/05/17   Katha Hamming, MD  clopidogrel (PLAVIX) 75 MG tablet Take 1 tablet (75 mg total) by mouth daily. 02/05/17   Katha Hamming, MD  lisinopril (PRINIVIL,ZESTRIL) 20 MG tablet Take 20 mg by mouth daily.    [provider]  meloxicam (MOBIC) 15 MG tablet Take 1 tablet (15 mg total) by mouth daily. 09/09/17 09/09/18  Tommi Rumps, PA-C  metoprolol tartrate (LOPRESSOR) 25 MG tablet Take 25 mg by mouth 2 (two) times daily.    [provider]  nitroGLYCERIN (NITROSTAT) 0.4 MG SL tablet Place 1 tablet (0.4 mg total) under the tongue every 5 (five) minutes x 3 doses as needed for chest pain. 02/04/17   Katha Hamming, MD  pantoprazole (PROTONIX) 40 MG tablet Take 1 tablet (40 mg total) by mouth daily. 02/05/17   Katha Hamming, MD  pravastatin (PRAVACHOL) 40 MG tablet Take 40 mg by mouth daily.  [provider]  predniSONE (DELTASONE) 10 MG tablet Take 6 tablets  today, on day 2 take 5 tablets, day 3 take 4 tablets, day 4 take 3 tablets, day 5 take  2 tablets and 1 tablet the last day 09/09/17   Tommi RumpsSummers, Rhonda L, PA-C    Allergies Atorvastatin  Family History  Problem Relation Age of Onset  . CAD Father   . Kidney disease Neg Hx   . Diabetes Mellitus II Neg Hx     Social History Social History   Tobacco Use  . Smoking status: Former  Smoker    Packs/day: 1.00    Years: 5.00    Pack years: 5.00    Types: Cigarettes    Last attempt to quit: 01/05/2017    Years since quitting: 0.6  . Smokeless tobacco: Never Used  Substance Use Topics  . Alcohol use: Yes    Comment: occassionally  . Drug use: Yes    Types: Marijuana    Review of Systems Constitutional: No fever/chills Cardiovascular: Denies chest pain. Respiratory: Denies shortness of breath. Gastrointestinal: No abdominal pain.  No nausea, no vomiting.   Genitourinary: Negative for dysuria. Musculoskeletal: Positive for right lower back pain with radiculopathy. Skin: Negative for rash. Neurological: Negative for headaches ____________________________________________   PHYSICAL EXAM:  VITAL SIGNS: ED Triage Vitals  Enc Vitals Group     BP 09/09/17 1130 110/66     Pulse Rate 09/09/17 1130 63     Resp 09/09/17 1130 16     Temp 09/09/17 1130 98.2 F (36.8 C)     Temp Source 09/09/17 1130 Oral     SpO2 09/09/17 1130 95 %     Weight 09/09/17 1131 240 lb (108.9 kg)     Height 09/09/17 1131 5\' 8"  (1.727 m)     Head Circumference --      Peak Flow --      Pain Score 09/09/17 1130 10     Pain Loc --      Pain Edu? --      Excl. in GC? --    Constitutional: Alert and oriented. Well appearing and in no acute distress. Eyes: Conjunctivae are normal.  Head: Atraumatic. Nose: No congestion/rhinnorhea. Neck: No stridor.   Cardiovascular: Normal rate, regular rhythm. Grossly normal heart sounds.  Good peripheral circulation. Respiratory: Normal respiratory effort.  No retractions. Lungs CTAB. Gastrointestinal: Soft and nontender. No distention.  No CVA tenderness noted. Musculoskeletal: Examination of the back there is no gross deformity and no point tenderness on palpation of the vertebral bodies for the lumbar spine.  There is marked tenderness on palpation of the right SI joint area and soft tissue.  Straight leg raises are unremarkable.  Good muscle  strength bilaterally.  Patient gait is within normal limits and without assistance. Neurologic:  Normal speech and language. No gross focal neurologic deficits are appreciated. No gait instability. Skin:  Skin is warm, dry and intact. No rash noted. Psychiatric: Mood and affect are normal. Speech and behavior are normal.  ____________________________________________   LABS (all labs ordered are listed, but only abnormal results are displayed)  Labs Reviewed - No data to display  PROCEDURES  Procedure(s) performed: None  Procedures  Critical Care performed: No  ____________________________________________   INITIAL IMPRESSION / ASSESSMENT AND PLAN / ED COURSE  As part of my medical decision making, I reviewed the following data within the electronic MEDICAL RECORD NUMBER Notes from prior ED visits and [place] Controlled Substance Database  Patient  was discharged with a prescription for prednisone 60 mg 6-day taper.  After finishing this medication he will begin taking meloxicam 15 mg 1 daily with food.  We discussed using ice or heat to his back and also using 2 pillows under his knees if sleeping on his back.  [facility].  He will return to the emergency room should he experience any incontinence or severe worsening of his symptoms.  ____________________________________________   FINAL CLINICAL IMPRESSION(S) / ED DIAGNOSES  Final diagnoses:  Sciatica of right side     ED Discharge Orders        Ordered    predniSONE (DELTASONE) 10 MG tablet     09/09/17 1337    meloxicam (MOBIC) 15 MG tablet  Daily     09/09/17 1337       Note:  This document was prepared using Dragon voice recognition software and may include unintentional dictation errors.    Tommi Rumps, PA-C 09/09/17 1517    Minna Antis, MD 09/12/17 1118

## 2017-09-11 ENCOUNTER — Other Ambulatory Visit: Payer: Self-pay

## 2017-09-11 ENCOUNTER — Encounter: Payer: Self-pay | Admitting: Pharmacist

## 2017-09-11 ENCOUNTER — Ambulatory Visit: Payer: Self-pay | Admitting: Pharmacist

## 2017-09-11 VITALS — BP 102/72 | Ht 68.0 in | Wt 238.0 lb

## 2017-09-11 DIAGNOSIS — Z79899 Other long term (current) drug therapy: Secondary | ICD-10-CM

## 2017-09-11 NOTE — Progress Notes (Addendum)
Medication Management Clinic Visit Note  Patient: Bryce [name] MRN: [id number] Date of Birth: Mar 28, 1966 PCP: Patient, No Pcp Per   Bryce [name] 52 y.o. male presents for a medication therapy managment visit with the pharmacist today.  BP 102/72 (BP Location: Right Arm, Patient Position: Sitting, Cuff Size: Large)   Ht 5\' 8"  (1.727 m)   Wt 238 lb (108 kg)   BMI 36.19 kg/m   Patient Information   Past Medical History:  Diagnosis Date  . Chronic systolic congestive heart failure (HCC)   . Coronary artery disease   . Hypertension   . Ischemic cardiomyopathy   . MI (myocardial infarction) [facility])       Past Surgical History:  Procedure Laterality Date  . CARDIAC CATHETERIZATION N/A 12/28/2014   Procedure: Left Heart Cath and Coronary Angiography;  Surgeon: Alwyn Pea, MD;  Location: [facility] INVASIVE CV LAB;  Service: Cardiovascular;  Laterality: N/A;  . CORONARY ANGIOPLASTY     Non-ST elevation MI status post stenting within bare-metal stent of the 75% lesion of the LAD, 11/13/2010.   . CORONARY STENT INTERVENTION N/A 02/02/2017   Procedure: CORONARY/GRAFT ACUTE MI REVASCULARIZATION;  Surgeon: Alwyn Pea, MD;  Location: [facility] INVASIVE CV LAB;  Service: Cardiovascular;  Laterality: N/A;  . LEFT HEART CATH AND CORONARY ANGIOGRAPHY N/A 02/02/2017   Procedure: LEFT HEART CATH AND CORONARY ANGIOGRAPHY;  Surgeon: Alwyn Pea, MD;  Location: [facility] INVASIVE CV LAB;  Service: Cardiovascular;  Laterality: N/A;     Family History  Problem Relation Age of Onset  . CAD Father   . Kidney disease Neg Hx   . Diabetes Mellitus II Neg Hx    Outpatient Encounter Medications as of 09/11/2017  Medication Sig  . aspirin EC 81 MG EC tablet Take 1 tablet (81 mg total) by mouth daily.  . clopidogrel (PLAVIX) 75 MG tablet Take 1 tablet (75 mg total) by mouth daily.  Marland Kitchen lisinopril (PRINIVIL,ZESTRIL) 20 MG tablet Take 20 mg by mouth daily.  . meloxicam (MOBIC) 15 MG tablet Take 1  tablet (15 mg total) by mouth daily.  . metoprolol tartrate (LOPRESSOR) 25 MG tablet Take 25 mg by mouth 2 (two) times daily.  . nitroGLYCERIN (NITROSTAT) 0.4 MG SL tablet Place 1 tablet (0.4 mg total) under the tongue every 5 (five) minutes x 3 doses as needed for chest pain.  . pantoprazole (PROTONIX) 40 MG tablet Take 1 tablet (40 mg total) by mouth daily.  . pravastatin (PRAVACHOL) 40 MG tablet Take 40 mg by mouth daily.  . predniSONE (DELTASONE) 10 MG tablet Take 6 tablets  today, on day 2 take 5 tablets, day 3 take 4 tablets, day 4 take 3 tablets, day 5 take  2 tablets and 1 tablet the last day   No facility-administered encounter medications on file as of 09/11/2017.     New Diagnoses (since last visit): NA  Family Support: Good  Lifestyle  Exercise: golfing occasionally, walking in [employer] or flea-markets    Diet:  Breakfast: country ham or sausage biscuit  Lunch: potatoes, corn, macaroni and cheese, bread and hush puppies   Dinner: hot dogs, beanie wennies  Drinks: sweet tea, water   Snacks: candy bars, fruits       Social History   Substance and Sexual Activity  Alcohol Use Yes   Comment: occassionally     Social History   Tobacco Use  Smoking Status Former Smoker  . Packs/day: 1.00  . Years: 5.00  . Pack  years: 5.00  . Types: Cigarettes  . Last attempt to quit: 01/05/2017  . Years since quitting: 0.6  Smokeless Tobacco Never Used    Health Maintenance  Topic Date Due  . TETANUS/TDAP  10/11/1983  . COLONOSCOPY  10/11/2014  . INFLUENZA VACCINE  01/01/2018  . HIV Screening  Completed    Assessment and Plan:  Medication Compliance: Patient is able to correctly state the indications and frequency of all his mediations and reports adherence to his current regimen. He reports that he prefers not use a pill box, and states that he never misses doses.   STEMI/CAD: Patient is currently taking ASA, clopidogrel, lisinopril, metoprolol, pravastatin, and has  nitroglycerine PRN. States that he has not had to take this since his last MI.   Dyslipidemia: Patient is currently taking pravastatin (moderate intensity), and was recently switched from atorvastatin due to myalgias. Pravastatin had no refills remaining, so request was sent to MD today. Reports that he is tolerating this well. Patient would likely benefit from a high intensity statin, but given history of myalgias, should probably avoid a higher intensity statin at this time.   HTN: Patient is currently taking lisinopril, BP at goal (<130/80 mmHg) in office today. Lisinopril well tolerated.   Back pain: Patient is currently taking a prednisone taper and meloxicam. States that he hasn't seen improvements in pain yet, but this is only his second day taking them. Expect to see improvement after completion of prednisone. Long term use of NSAIDs not recommended given history of STEMI/CAD, patient aware.   GERD: Patient is currently taking pantoprazole, mostly controlled. Reports taking esomeprazole OTC in addition to pantoprazole when needed (after over eating or having greasy/spicy foods). Advised patient to take Tums for breakthrough GERD symptoms to have quicker relief and avoid using an additional PPI.   Lifestyle: Patient is aware of the negative consequences associated with his poor eating habits, and states that he has always ate this way and is unwilling to make significant changes. We discussed limiting amount of sugar he adds to his sweet tea and subsutituting a starchy vegetable for a healthier option at meals and this is something he is willing to consider. He will also plan on starting to walk in his neighborhood a few days per week as tolerated.   RTC: 1 year   Cori RazorLauren Hadli [place], PharmD Candidate   Cosigned:Christan Leonor LivHolt, PharmD, RPh [facility] [phone]

## 2017-09-16 ENCOUNTER — Ambulatory Visit: Payer: Self-pay

## 2017-09-19 ENCOUNTER — Encounter: Payer: Self-pay | Admitting: Emergency Medicine

## 2017-09-19 ENCOUNTER — Telehealth: Payer: Self-pay | Admitting: Emergency Medicine

## 2017-09-19 ENCOUNTER — Emergency Department
Admission: EM | Admit: 2017-09-19 | Discharge: 2017-09-19 | Disposition: A | Discharge status: Home / Self Care | Payer: Self-pay | Attending: Emergency Medicine | Admitting: Emergency Medicine

## 2017-09-19 ENCOUNTER — Other Ambulatory Visit: Payer: Self-pay

## 2017-09-19 DIAGNOSIS — M545 Low back pain: Secondary | ICD-10-CM | POA: Insufficient documentation

## 2017-09-19 DIAGNOSIS — R1030 Lower abdominal pain, unspecified: Secondary | ICD-10-CM | POA: Insufficient documentation

## 2017-09-19 DIAGNOSIS — Z5321 Procedure and treatment not carried out due to patient leaving prior to being seen by health care provider: Secondary | ICD-10-CM | POA: Insufficient documentation

## 2017-09-19 LAB — COMPREHENSIVE METABOLIC PANEL
ALT: 36 U/L (ref 17–63)
ANION GAP: 7 (ref 5–15)
AST: 24 U/L (ref 15–41)
Albumin: 4.2 g/dL (ref 3.5–5.0)
Alkaline Phosphatase: 62 U/L (ref 38–126)
BUN: 21 mg/dL — ABNORMAL HIGH (ref 6–20)
CALCIUM: 9.3 mg/dL (ref 8.9–10.3)
CO2: 23 mmol/L (ref 22–32)
Chloride: 107 mmol/L (ref 101–111)
Creatinine, Ser: 0.88 mg/dL (ref 0.61–1.24)
Glucose, Bld: 135 mg/dL — ABNORMAL HIGH (ref 65–99)
Potassium: 3.9 mmol/L (ref 3.5–5.1)
SODIUM: 137 mmol/L (ref 135–145)
Total Bilirubin: 1.2 mg/dL (ref 0.3–1.2)
Total Protein: 7.4 g/dL (ref 6.5–8.1)

## 2017-09-19 LAB — CBC
HCT: 43.8 % (ref 40.0–52.0)
HEMOGLOBIN: 14.9 g/dL (ref 13.0–18.0)
MCH: 32.4 pg (ref 26.0–34.0)
MCHC: 34.1 g/dL (ref 32.0–36.0)
MCV: 95.1 fL (ref 80.0–100.0)
PLATELETS: 211 10*3/uL (ref 150–440)
RBC: 4.6 MIL/uL (ref 4.40–5.90)
RDW: 14.5 % (ref 11.5–14.5)
WBC: 11.2 10*3/uL — AB (ref 3.8–10.6)

## 2017-09-19 LAB — LIPASE, BLOOD: LIPASE: 41 U/L (ref 11–51)

## 2017-09-19 NOTE — ED Triage Notes (Signed)
Patient ambulatory to triage with steady gait, without difficulty or distress noted; pt reports lower abd and lower back pain since yesterday accomp by nausea

## 2017-09-19 NOTE — Telephone Encounter (Signed)
Called patient due to lwot to inquire about condition and follow up plans. No answer and no voicemail available. 

## 2017-09-30 ENCOUNTER — Emergency Department
Admission: EM | Admit: 2017-09-30 | Discharge: 2017-09-30 | Disposition: A | Discharge status: Home / Self Care | Payer: Self-pay | Attending: Emergency Medicine | Admitting: Emergency Medicine

## 2017-09-30 ENCOUNTER — Encounter: Payer: Self-pay | Admitting: Emergency Medicine

## 2017-09-30 ENCOUNTER — Other Ambulatory Visit: Payer: Self-pay

## 2017-09-30 DIAGNOSIS — I110 Hypertensive heart disease with heart failure: Secondary | ICD-10-CM | POA: Insufficient documentation

## 2017-09-30 DIAGNOSIS — Z7982 Long term (current) use of aspirin: Secondary | ICD-10-CM | POA: Insufficient documentation

## 2017-09-30 DIAGNOSIS — I5022 Chronic systolic (congestive) heart failure: Secondary | ICD-10-CM | POA: Insufficient documentation

## 2017-09-30 DIAGNOSIS — I2510 Atherosclerotic heart disease of native coronary artery without angina pectoris: Secondary | ICD-10-CM | POA: Insufficient documentation

## 2017-09-30 DIAGNOSIS — Z87891 Personal history of nicotine dependence: Secondary | ICD-10-CM | POA: Insufficient documentation

## 2017-09-30 DIAGNOSIS — Z955 Presence of coronary angioplasty implant and graft: Secondary | ICD-10-CM | POA: Insufficient documentation

## 2017-09-30 DIAGNOSIS — M6282 Rhabdomyolysis: Secondary | ICD-10-CM | POA: Insufficient documentation

## 2017-09-30 DIAGNOSIS — T672XXA Heat cramp, initial encounter: Secondary | ICD-10-CM | POA: Insufficient documentation

## 2017-09-30 DIAGNOSIS — Z79899 Other long term (current) drug therapy: Secondary | ICD-10-CM | POA: Insufficient documentation

## 2017-09-30 LAB — CBC
HCT: 42.4 % (ref 40.0–52.0)
HEMOGLOBIN: 14.3 g/dL (ref 13.0–18.0)
MCH: 32.6 pg (ref 26.0–34.0)
MCHC: 33.8 g/dL (ref 32.0–36.0)
MCV: 96.4 fL (ref 80.0–100.0)
Platelets: 262 10*3/uL (ref 150–440)
RBC: 4.4 MIL/uL (ref 4.40–5.90)
RDW: 14.5 % (ref 11.5–14.5)
WBC: 6.7 10*3/uL (ref 3.8–10.6)

## 2017-09-30 LAB — URINALYSIS, COMPLETE (UACMP) WITH MICROSCOPIC
BACTERIA UA: NONE SEEN
Bilirubin Urine: NEGATIVE
Glucose, UA: NEGATIVE mg/dL
Hgb urine dipstick: NEGATIVE
Ketones, ur: NEGATIVE mg/dL
Leukocytes, UA: NEGATIVE
Nitrite: NEGATIVE
PH: 6 (ref 5.0–8.0)
Protein, ur: NEGATIVE mg/dL
SPECIFIC GRAVITY, URINE: 1.002 — AB (ref 1.005–1.030)
SQUAMOUS EPITHELIAL / LPF: NONE SEEN (ref 0–5)
WBC UA: NONE SEEN WBC/hpf (ref 0–5)

## 2017-09-30 LAB — BASIC METABOLIC PANEL
Anion gap: 8 (ref 5–15)
BUN: 20 mg/dL (ref 6–20)
CO2: 23 mmol/L (ref 22–32)
CREATININE: 1.1 mg/dL (ref 0.61–1.24)
Calcium: 9.5 mg/dL (ref 8.9–10.3)
Chloride: 100 mmol/L — ABNORMAL LOW (ref 101–111)
GFR calc Af Amer: >60 mL/min (ref >60)
GFR calc non Af Amer: >60 mL/min (ref >60)
Glucose, Bld: 82 mg/dL (ref 65–99)
POTASSIUM: 3.8 mmol/L (ref 3.5–5.1)
SODIUM: 131 mmol/L — AB (ref 135–145)

## 2017-09-30 LAB — TROPONIN I

## 2017-09-30 LAB — CK: CK TOTAL: 497 U/L — AB (ref 49–397)

## 2017-09-30 MED ORDER — SODIUM CHLORIDE 0.9 % IV BOLUS
1000.0000 mL | Freq: Once | INTRAVENOUS | Status: AC
  Administered 2017-09-30: 1000 mL via INTRAVENOUS

## 2017-09-30 NOTE — ED Provider Notes (Signed)
[facility] Emergency Department Provider Note  ____________________________________________   First MD Initiated Contact with Patient 09/30/17 1727     (approximate)  I have reviewed the triage vital signs and the nursing notes.   HISTORY  Chief Complaint Dehydration   HPI Bryce [name] is a 52 y.o. male with a history of MI as well as rhabdomyolysis with kidney injury from dehydration who is presenting to the emergency department today with diffuse body aches after working outside.  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  He says that he started to have cramping and sweating profusely.  He says that his cramping at this time is a 7 out of 10.  Does not report any nausea or vomiting.  Says this is similar to how he felt previously with his kidney failure.   Past Medical History:  Diagnosis Date  . Chronic systolic congestive heart failure (HCC)   . Coronary artery disease   . Hypertension   . Ischemic cardiomyopathy   . MI (myocardial infarction) [facility])     Patient Active Problem List   Diagnosis Date Noted  . CAD (coronary artery disease) 02/20/2017  . Dehydration 02/20/2017  . STEMI (ST elevation myocardial infarction) (HCC) 02/02/2017  . AKI (acute kidney injury) (HCC) 12/15/2015  . Solitary pulmonary nodule 12/23/2014  . Chest pain 12/22/2014  . Essential hypertension 12/22/2014  . Hyperlipidemia 12/22/2014    Past Surgical History:  Procedure Laterality Date  . CARDIAC CATHETERIZATION N/A 12/28/2014   Procedure: Left Heart Cath and Coronary Angiography;  Surgeon: Alwyn Pea, MD;  Location: [facility] INVASIVE CV LAB;  Service: Cardiovascular;  Laterality: N/A;  . CORONARY ANGIOPLASTY     Non-ST elevation MI status post stenting within bare-metal stent of the 75% lesion of the LAD, 11/13/2010.   . CORONARY STENT INTERVENTION N/A 02/02/2017   Procedure: CORONARY/GRAFT ACUTE MI REVASCULARIZATION;  Surgeon: Alwyn Pea, MD;  Location: [facility] INVASIVE CV LAB;  Service: Cardiovascular;  Laterality: N/A;  . LEFT HEART CATH AND CORONARY ANGIOGRAPHY N/A 02/02/2017   Procedure: LEFT HEART CATH AND CORONARY ANGIOGRAPHY;  Surgeon: Alwyn Pea, MD;  Location: [facility] INVASIVE CV LAB;  Service: Cardiovascular;  Laterality: N/A;    Prior to Admission medications   Medication Sig Start Date End Date Taking? Authorizing Provider  aspirin EC 81 MG EC tablet Take 1 tablet (81 mg total) by mouth daily. 02/05/17   Katha Hamming, MD  clopidogrel (PLAVIX) 75 MG tablet Take 1 tablet (75 mg total) by mouth daily. 02/05/17   Katha Hamming, MD  lisinopril (PRINIVIL,ZESTRIL) 20 MG tablet Take 20 mg by mouth daily.    [provider]  meloxicam (MOBIC) 15 MG tablet Take 1 tablet (15 mg total) by mouth daily. 09/09/17 09/09/18  Tommi Rumps, PA-C  metoprolol tartrate (LOPRESSOR) 25 MG tablet Take 25 mg by mouth 2 (two) times daily.    [provider]  nitroGLYCERIN (NITROSTAT) 0.4 MG SL tablet Place 1 tablet (0.4 mg total) under the tongue every 5 (five) minutes x 3 doses as needed for chest pain. 02/04/17   Katha Hamming, MD  pantoprazole (PROTONIX) 40 MG tablet Take 1 tablet (40 mg total) by mouth daily. 02/05/17   Katha Hamming, MD  pravastatin (PRAVACHOL) 40 MG tablet Take 40 mg by mouth daily.    [provider]  predniSONE (DELTASONE) 10 MG tablet Take 6 tablets  today, on day 2 take 5 tablets, day  3 take 4 tablets, day 4 take 3 tablets, day 5 take  2 tablets and 1 tablet the last day 09/09/17   Tommi Rumps, PA-C    Allergies Atorvastatin  Family History  Problem Relation Age of Onset  . CAD Father   . Kidney disease Neg Hx   . Diabetes Mellitus II Neg Hx     Social History Social History   Tobacco Use  . Smoking status: Former Smoker    Packs/day: 1.00    Years: 5.00    Pack years: 5.00    Types: Cigarettes    Last attempt to quit: 01/05/2017    Years  since quitting: 0.7  . Smokeless tobacco: Never Used  Substance Use Topics  . Alcohol use: Yes    Comment: occassionally  . Drug use: Yes    Types: Marijuana    Review of Systems  Constitutional: No fever/chills Eyes: No visual changes. ENT: No sore throat. Cardiovascular: Denies chest pain. Respiratory: Denies shortness of breath. Gastrointestinal: No abdominal pain.  No nausea, no vomiting.  No diarrhea.  No constipation. Genitourinary: Negative for dysuria. Musculoskeletal: Negative for back pain. Skin: Negative for rash. Neurological: Negative for headaches, focal weakness or numbness.   ____________________________________________   PHYSICAL EXAM:  VITAL SIGNS: ED Triage Vitals  Enc Vitals Group     BP 09/30/17 1651 112/77     Pulse Rate 09/30/17 1651 74     Resp 09/30/17 1651 18     Temp 09/30/17 1651 98.2 F (36.8 C)     Temp Source 09/30/17 1651 Oral     SpO2 09/30/17 1651 97 %     Weight 09/30/17 1655 240 lb (108.9 kg)     Height 09/30/17 1655  (1.727 m)     Head Circumference --      Peak Flow --      Pain Score 09/30/17 1658 10     Pain Loc --      Pain Edu? --      Excl. in GC? --     Constitutional: Alert and oriented. Well appearing and in no acute distress. Eyes: Conjunctivae are normal.  Head: Atraumatic. Nose: No congestion/rhinnorhea. Mouth/Throat: Mucous membranes are moist.  Neck: No stridor.   Cardiovascular: Normal rate, regular rhythm. Grossly normal heart sounds.  Respiratory: Normal respiratory effort.  No retractions. Lungs CTAB. Gastrointestinal: Soft and nontender. No distention.  Musculoskeletal: No lower extremity tenderness nor edema.  No joint effusions. Neurologic:  Normal speech and language. No gross focal neurologic deficits are appreciated. Skin:  Skin is warm, dry and intact. No rash noted. Psychiatric: Mood and affect are normal. Speech and behavior are normal.  ____________________________________________     LABS (all labs ordered are listed, but only abnormal results are displayed)  Labs Reviewed  BASIC METABOLIC PANEL - Abnormal; Notable for the following components:      Result Value   Sodium 131 (*)    Chloride 100 (*)    All other components within normal limits  URINALYSIS, COMPLETE (UACMP) WITH MICROSCOPIC - Abnormal; Notable for the following components:   Color, Urine COLORLESS (*)    APPearance CLEAR (*)    Specific Gravity, Urine 1.002 (*)    All other components within normal limits  CK - Abnormal; Notable for the following components:   Total CK 497 (*)    All other components within normal limits  CBC  TROPONIN I   ____________________________________________  EKG  ED ECG REPORT I, Onalee Hua  Renard Hamper, the attending physician, personally viewed and interpreted this ECG.   Date: 09/30/2017  EKG Time: 1737  Rate: 69  Rhythm: normal sinus rhythm  Axis: Normal  Intervals:none  ST&T Change: No ST segment elevation or depression.  No abnormal T wave inversion.  ____________________________________________  RADIOLOGY   ____________________________________________   PROCEDURES  Procedure(s) performed:   Procedures  Critical Care performed:   ____________________________________________   INITIAL IMPRESSION / ASSESSMENT AND PLAN / ED COURSE  Pertinent labs & imaging results that were available during my care of the patient were reviewed by me and considered in my medical decision making (see chart for details).  DDX: Rhabdomyolysis, heat exhaustion, heat cramps, kidney failure, hypernatremia, hyperkalemia. As part of my medical decision making, I reviewed the following data within the electronic MEDICAL RECORD NUMBER Notes from prior ED visits and [place] Controlled Substance Database     ----------------------------------------- 7:27 PM on 09/30/2017 -----------------------------------------  Patient states that he feels well.  Slightly elevated CK.   However, given that the patient did not have any hemoglobin in his urine.  Is pain-free at this time without kidney failure I do not see the utility in keeping him fracture hydration and repeating his CK.  He will continue to p.o. hydrate at home.  He will be careful to work to stay cool and hydrated.  He says that he is considering at this point finding a different line of work because of repeated hospital visits and his recent MI.  He is understanding of the diagnosis as well as treatment plan and willing to comply.  ____________________________________________   FINAL CLINICAL IMPRESSION(S) / ED DIAGNOSES  Rhabdomyolysis.  He cramps.    NEW MEDICATIONS STARTED DURING THIS VISIT:  New Prescriptions   No medications on file     Note:  This document was prepared using Dragon voice recognition software and may include unintentional dictation errors.     Myrna Blazer, MD 09/30/17 [id number]

## 2017-09-30 NOTE — ED Triage Notes (Signed)
Pt in via POV with complaints cramping all over.  Pt reports hx of severe dehydration each summer with hospitalization. Vitals WDL, NAD noted at this time.

## 2018-01-13 ENCOUNTER — Other Ambulatory Visit: Payer: Self-pay

## 2018-01-13 ENCOUNTER — Inpatient Hospital Stay
Admission: EM | Admit: 2018-01-13 | Discharge: 2018-01-15 | DRG: 683 | Disposition: A | Discharge status: Home / Self Care | Payer: Self-pay | Attending: Internal Medicine | Admitting: Internal Medicine

## 2018-01-13 ENCOUNTER — Encounter: Payer: Self-pay | Admitting: Emergency Medicine

## 2018-01-13 ENCOUNTER — Emergency Department: Payer: Self-pay

## 2018-01-13 DIAGNOSIS — Z7952 Long term (current) use of systemic steroids: Secondary | ICD-10-CM

## 2018-01-13 DIAGNOSIS — Z791 Long term (current) use of non-steroidal anti-inflammatories (NSAID): Secondary | ICD-10-CM

## 2018-01-13 DIAGNOSIS — Z7982 Long term (current) use of aspirin: Secondary | ICD-10-CM

## 2018-01-13 DIAGNOSIS — Z8249 Family history of ischemic heart disease and other diseases of the circulatory system: Secondary | ICD-10-CM

## 2018-01-13 DIAGNOSIS — Z888 Allergy status to other drugs, medicaments and biological substances status: Secondary | ICD-10-CM

## 2018-01-13 DIAGNOSIS — I5022 Chronic systolic (congestive) heart failure: Secondary | ICD-10-CM | POA: Diagnosis present

## 2018-01-13 DIAGNOSIS — E860 Dehydration: Secondary | ICD-10-CM | POA: Diagnosis present

## 2018-01-13 DIAGNOSIS — I2510 Atherosclerotic heart disease of native coronary artery without angina pectoris: Secondary | ICD-10-CM | POA: Diagnosis present

## 2018-01-13 DIAGNOSIS — E785 Hyperlipidemia, unspecified: Secondary | ICD-10-CM | POA: Diagnosis present

## 2018-01-13 DIAGNOSIS — Z955 Presence of coronary angioplasty implant and graft: Secondary | ICD-10-CM

## 2018-01-13 DIAGNOSIS — Z87891 Personal history of nicotine dependence: Secondary | ICD-10-CM

## 2018-01-13 DIAGNOSIS — K219 Gastro-esophageal reflux disease without esophagitis: Secondary | ICD-10-CM | POA: Diagnosis present

## 2018-01-13 DIAGNOSIS — I252 Old myocardial infarction: Secondary | ICD-10-CM

## 2018-01-13 DIAGNOSIS — Z79899 Other long term (current) drug therapy: Secondary | ICD-10-CM

## 2018-01-13 DIAGNOSIS — I255 Ischemic cardiomyopathy: Secondary | ICD-10-CM | POA: Diagnosis present

## 2018-01-13 DIAGNOSIS — I110 Hypertensive heart disease with heart failure: Secondary | ICD-10-CM | POA: Diagnosis present

## 2018-01-13 DIAGNOSIS — N179 Acute kidney failure, unspecified: Principal | ICD-10-CM | POA: Diagnosis present

## 2018-01-13 DIAGNOSIS — Z7902 Long term (current) use of antithrombotics/antiplatelets: Secondary | ICD-10-CM

## 2018-01-13 DIAGNOSIS — F1290 Cannabis use, unspecified, uncomplicated: Secondary | ICD-10-CM | POA: Diagnosis present

## 2018-01-13 LAB — BASIC METABOLIC PANEL
ANION GAP: 12 (ref 5–15)
BUN: 37 mg/dL — ABNORMAL HIGH (ref 6–20)
CALCIUM: 10.2 mg/dL (ref 8.9–10.3)
CO2: 22 mmol/L (ref 22–32)
CREATININE: 2.92 mg/dL — AB (ref 0.61–1.24)
Chloride: 101 mmol/L (ref 98–111)
GFR calc non Af Amer: 23 mL/min — ABNORMAL LOW (ref >60)
GFR, EST AFRICAN AMERICAN: 27 mL/min — AB (ref >60)
Glucose, Bld: 156 mg/dL — ABNORMAL HIGH (ref 70–99)
Potassium: 3.6 mmol/L (ref 3.5–5.1)
SODIUM: 135 mmol/L (ref 135–145)

## 2018-01-13 LAB — CBC
HCT: 42.7 % (ref 40.0–52.0)
HEMOGLOBIN: 14.6 g/dL (ref 13.0–18.0)
MCH: 33.7 pg (ref 26.0–34.0)
MCHC: 34.2 g/dL (ref 32.0–36.0)
MCV: 98.6 fL (ref 80.0–100.0)
PLATELETS: 204 10*3/uL (ref 150–440)
RBC: 4.33 MIL/uL — ABNORMAL LOW (ref 4.40–5.90)
RDW: 14.7 % — ABNORMAL HIGH (ref 11.5–14.5)
WBC: 11.4 10*3/uL — AB (ref 3.8–10.6)

## 2018-01-13 LAB — CK: CK TOTAL: 341 U/L (ref 49–397)

## 2018-01-13 LAB — TROPONIN I: Troponin I: <0.03 ng/mL (ref <0.03)

## 2018-01-13 MED ORDER — CLOPIDOGREL BISULFATE 75 MG PO TABS
75.0000 mg | ORAL_TABLET | Freq: Every day | ORAL | Status: DC
Start: 2018-01-14 — End: 2018-01-15
  Administered 2018-01-14 – 2018-01-15 (×2): 75 mg via ORAL
  Filled 2018-01-13 (×2): qty 1

## 2018-01-13 MED ORDER — ACETAMINOPHEN 325 MG PO TABS
650.0000 mg | ORAL_TABLET | Freq: Four times a day (QID) | ORAL | Status: DC | PRN
  Administered 2018-01-13: 650 mg via ORAL
  Filled 2018-01-13: qty 2

## 2018-01-13 MED ORDER — PRAVASTATIN SODIUM 40 MG PO TABS
40.0000 mg | ORAL_TABLET | Freq: Every day | ORAL | Status: DC
  Administered 2018-01-14 – 2018-01-15 (×2): 40 mg via ORAL
  Filled 2018-01-13 (×2): qty 1

## 2018-01-13 MED ORDER — SODIUM CHLORIDE 0.9 % IV SOLN: Freq: Once | INTRAVENOUS | Status: DC

## 2018-01-13 MED ORDER — SODIUM CHLORIDE 0.9 % IV SOLN
INTRAVENOUS | Status: DC
  Administered 2018-01-13 – 2018-01-15 (×4): via INTRAVENOUS

## 2018-01-13 MED ORDER — ASPIRIN EC 81 MG PO TBEC
81.0000 mg | DELAYED_RELEASE_TABLET | Freq: Every day | ORAL | Status: DC
  Administered 2018-01-14 – 2018-01-15 (×2): 81 mg via ORAL
  Filled 2018-01-13 (×2): qty 1

## 2018-01-13 MED ORDER — METOPROLOL TARTRATE 25 MG PO TABS
12.5000 mg | ORAL_TABLET | Freq: Two times a day (BID) | ORAL | Status: DC
  Administered 2018-01-13 – 2018-01-15 (×4): 12.5 mg via ORAL
  Filled 2018-01-13 (×4): qty 1

## 2018-01-13 MED ORDER — ONDANSETRON HCL 4 MG/2ML IJ SOLN
4.0000 mg | Freq: Four times a day (QID) | INTRAMUSCULAR | Status: DC | PRN
  Administered 2018-01-13: 4 mg via INTRAVENOUS
  Filled 2018-01-13: qty 2

## 2018-01-13 MED ORDER — BUTALBITAL-APAP-CAFFEINE 50-325-40 MG PO TABS
2.0000 | ORAL_TABLET | Freq: Four times a day (QID) | ORAL | Status: DC | PRN
  Administered 2018-01-13: 2 via ORAL
  Filled 2018-01-13: qty 2

## 2018-01-13 MED ORDER — HEPARIN SODIUM (PORCINE) 5000 UNIT/ML IJ SOLN
5000.0000 [IU] | Freq: Three times a day (TID) | INTRAMUSCULAR | Status: DC
  Administered 2018-01-13 – 2018-01-15 (×5): 5000 [IU] via SUBCUTANEOUS
  Filled 2018-01-13 (×5): qty 1

## 2018-01-13 MED ORDER — NITROGLYCERIN 0.4 MG SL SUBL: 0.4000 mg | SUBLINGUAL_TABLET | SUBLINGUAL | Status: DC | PRN

## 2018-01-13 MED ORDER — ONDANSETRON HCL 4 MG PO TABS: 4.0000 mg | ORAL_TABLET | Freq: Four times a day (QID) | ORAL | Status: DC | PRN

## 2018-01-13 MED ORDER — SODIUM CHLORIDE 0.9 % IV BOLUS
1000.0000 mL | Freq: Once | INTRAVENOUS | Status: AC
  Administered 2018-01-13: 1000 mL via INTRAVENOUS

## 2018-01-13 MED ORDER — PANTOPRAZOLE SODIUM 40 MG PO TBEC
40.0000 mg | DELAYED_RELEASE_TABLET | Freq: Every day | ORAL | Status: DC
  Administered 2018-01-14 – 2018-01-15 (×2): 40 mg via ORAL
  Filled 2018-01-13 (×2): qty 1

## 2018-01-13 MED ORDER — ACETAMINOPHEN 650 MG RE SUPP: 650.0000 mg | Freq: Four times a day (QID) | RECTAL | Status: DC | PRN

## 2018-01-13 NOTE — H&P (Signed)
[employer] [facility] [place] [facility]   PATIENT NAME: Bryce [name]    MR#:  [id number]  DATE OF BIRTH:  1965/09/06  DATE OF ADMISSION:  01/13/2018  PRIMARY CARE PHYSICIAN: [facility]   REQUESTING/REFERRING PHYSICIAN: Dr. Phineas SemenGraydon Goodman  CHIEF COMPLAINT:   Chief Complaint  Patient presents with  . Chest Pain    HISTORY OF PRESENT ILLNESS:  Bryce ArchRodney [name]  is a 52 y.o. male with a known history of hypertension, coronary artery disease status post stent placement, chronic systolic CHF, ischemic cardiomyopathy, previous history of MI who presents to the hospital due to diaphoresis, dizziness and vague chest pain.  Patient says he was in his usual state of health when after work today he had an episode where he became diaphoretic and dizzy and then developed some chest tightness/pain.  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  He has a previous history of MI and therefore was a bit concerned and came to the ER for further evaluation.  In the emergency room patient was noted to be in acute kidney injury and therefore hospitalist services were contacted for admission.  Patient denies any further chest pain presently, he denies any shortness of breath, nausea vomiting abdominal pain fever chills cough or any other associated symptoms presently.  PAST MEDICAL HISTORY:   Past Medical History:  Diagnosis Date  . Chronic systolic congestive heart failure (HCC)   . Coronary artery disease   . Hypertension   . Ischemic cardiomyopathy   . MI (myocardial infarction) (HCC)     PAST SURGICAL HISTORY:   Past Surgical History:  Procedure Laterality Date  . CARDIAC CATHETERIZATION N/A 12/28/2014   Procedure: Left Heart Cath and Coronary Angiography;  Surgeon: Alwyn Peawayne D Callwood, MD;  Location: [facility] INVASIVE CV LAB;  Service: Cardiovascular;  Laterality: N/A;  . CORONARY ANGIOPLASTY     Non-ST  elevation MI status post stenting within bare-metal stent of the 75% lesion of the LAD, 11/13/2010.   . CORONARY STENT INTERVENTION N/A 02/02/2017   Procedure: CORONARY/GRAFT ACUTE MI REVASCULARIZATION;  Surgeon: Alwyn Peaallwood, Dwayne D, MD;  Location: [facility] INVASIVE CV LAB;  Service: Cardiovascular;  Laterality: N/A;  . LEFT HEART CATH AND CORONARY ANGIOGRAPHY N/A 02/02/2017   Procedure: LEFT HEART CATH AND CORONARY ANGIOGRAPHY;  Surgeon: Alwyn Peaallwood, Dwayne D, MD;  Location: [facility] INVASIVE CV LAB;  Service: Cardiovascular;  Laterality: N/A;    SOCIAL HISTORY:   Social History   Tobacco Use  . Smoking status: Former Smoker    Packs/day: 1.00    Years: 5.00    Pack years: 5.00    Types: Cigarettes    Last attempt to quit: 01/05/2017    Years since quitting: 1.0  . Smokeless tobacco: Never Used  Substance Use Topics  . Alcohol use: Yes    Comment: occassionally    FAMILY HISTORY:   Family History  Problem Relation Age of Onset  . CAD Father   . Kidney disease Neg Hx   . Diabetes Mellitus II Neg Hx     DRUG ALLERGIES:   Allergies  Allergen Reactions  . Atorvastatin     Myalgias    REVIEW OF SYSTEMS:   Review of Systems  Constitutional: Negative for fever and weight loss.  HENT: Negative for congestion, nosebleeds and tinnitus.   Eyes: Negative for blurred vision, double vision and redness.  Respiratory: Negative for cough, hemoptysis and shortness of  breath.   Cardiovascular: Positive for chest pain. Negative for orthopnea, leg swelling and PND.  Gastrointestinal: Negative for abdominal pain, diarrhea, melena, nausea and vomiting.  Genitourinary: Negative for dysuria, hematuria and urgency.  Musculoskeletal: Negative for falls and joint pain.  Neurological: Positive for weakness. Negative for dizziness, tingling, sensory change, focal weakness, seizures and headaches.  Endo/Heme/Allergies: Negative for polydipsia. Does not bruise/bleed easily.  Psychiatric/Behavioral: Negative for  depression and memory loss. The patient is not nervous/anxious.     MEDICATIONS AT HOME:   Prior to Admission medications   Medication Sig Start Date End Date Taking? Authorizing Provider  aspirin EC 81 MG EC tablet Take 1 tablet (81 mg total) by mouth daily. 02/05/17  Yes Katha HammingKonidena, Snehalatha, MD  clopidogrel (PLAVIX) 75 MG tablet Take 1 tablet (75 mg total) by mouth daily. 02/05/17  Yes Katha HammingKonidena, Snehalatha, MD  lisinopril (PRINIVIL,ZESTRIL) 20 MG tablet Take 20 mg by mouth daily.   Yes [provider]  meloxicam (MOBIC) 15 MG tablet Take 1 tablet (15 mg total) by mouth daily. Patient taking differently: Take 15 mg by mouth daily as needed for pain.  09/09/17 09/09/18 Yes Bridget HartshornSummers, Rhonda L, PA-C  metoprolol tartrate (LOPRESSOR) 25 MG tablet Take 12.5 mg by mouth 2 (two) times daily.    Yes [provider]  nitroGLYCERIN (NITROSTAT) 0.4 MG SL tablet Place 1 tablet (0.4 mg total) under the tongue every 5 (five) minutes x 3 doses as needed for chest pain. 02/04/17  Yes Katha HammingKonidena, Snehalatha, MD  pantoprazole (PROTONIX) 40 MG tablet Take 1 tablet (40 mg total) by mouth daily. 02/05/17  Yes Katha HammingKonidena, Snehalatha, MD  pravastatin (PRAVACHOL) 40 MG tablet Take 40 mg by mouth daily.   Yes [provider]      VITAL SIGNS:  Blood pressure (!) 91/59, pulse 61, temperature 98.3 F (36.8 C), temperature source Oral, resp. rate (!) 26, height 5\' 2"  (1.575 m), weight 108.9 kg, SpO2 95 %.  PHYSICAL EXAMINATION:  Physical Exam  GENERAL:  52 y.o.-year-old patient lying in the bed in no acute distress.  EYES: Pupils equal, round, reactive to light and accommodation. No scleral icterus. Extraocular muscles intact.  HEENT: Head atraumatic, normocephalic. Oropharynx and nasopharynx clear. No oropharyngeal erythema, moist oral mucosa  NECK:  Supple, no jugular venous distention. No thyroid enlargement, no tenderness.  LUNGS: Normal breath sounds bilaterally, no wheezing, rales, rhonchi. No use  of accessory muscles of respiration.  CARDIOVASCULAR: S1, S2 RRR. No murmurs, rubs, gallops, clicks.  ABDOMEN: Soft, nontender, nondistended. Bowel sounds present. No organomegaly or mass.  EXTREMITIES: No pedal edema, cyanosis, or clubbing. + 2 pedal & radial pulses b/l.   NEUROLOGIC: Cranial nerves II through XII are intact. No focal Motor or sensory deficits appreciated b/l PSYCHIATRIC: The patient is alert and oriented x 3. SKIN: No obvious rash, lesion, or ulcer.   LABORATORY PANEL:   CBC Recent Labs  Lab 01/13/18 1533  WBC 11.4*  HGB 14.6  HCT 42.7  PLT 204   ------------------------------------------------------------------------------------------------------------------  Chemistries  Recent Labs  Lab 01/13/18 1533  NA 135  K 3.6  CL 101  CO2 22  GLUCOSE 156*  BUN 37*  CREATININE 2.92*  CALCIUM 10.2   ------------------------------------------------------------------------------------------------------------------  Cardiac Enzymes Recent Labs  Lab 01/13/18 1533  TROPONINI <0.03   ------------------------------------------------------------------------------------------------------------------  RADIOLOGY:  Dg Chest 2 View  Result Date: 01/13/2018 CLINICAL DATA:  Discomfort in chest.  Chest pain.  Diaphoresis. EXAM: CHEST - 2 VIEW COMPARISON:  Two-view chest x-ray 02/02/2017  FINDINGS: The heart size is normal. Chronic interstitial coarsening and scarring is stable. No superimposed edema or effusion is present. No focal airspace disease is present. The visualized soft tissues and bony thorax are unremarkable. Stable degenerative changes are present thoracic spine. IMPRESSION: 1. No acute cardiopulmonary disease or significant interval change. 2. Stable chronic interstitial coarsening and scarring. Electronically Signed   By: Marin Roberts M.D.   On: 01/13/2018 16:08     IMPRESSION AND PLAN:   52 year old male with past medical history of hypertension,  hyperlipidemia, coronary artery disease status post stent placement, ischemic cardiomyopathy, previous history of MI, previous history of rhabdomyolysis who presents to the hospital due to diaphoresis, dizziness and vague chest pain.  1.  Acute renal failure-secondary to dehydration.  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 -We will aggressively hydrate the patient with IV fluids, hold his lisinopril.  Follow BUN and creatinine. -I will also get a renal ultrasound.  2.  Chest pain- patient symptoms a very atypical for angina.  He does have a history of coronary artery disease with stent placement. -I will observe him on telemetry, cycle his cardiac markers for now. -His EKG acutely shows no ST or T wave changes.  Continue aspirin, Plavix, metoprolol, Pravachol.  3.  Essential hypertension-continue metoprolol, hold lisinopril given the acute kidney injury.  4.  GERD-continue Protonix.  5. Hyperlipidemia - cont. Pravachol.     All the records are reviewed and case discussed with ED provider. Management plans discussed with the patient, family and they are in agreement.  CODE STATUS: Full code  TOTAL TIME TAKING CARE OF THIS PATIENT: 40 minutes.    Houston Siren M.D on 01/13/2018 at 6:14 PM  Between 7am to 6pm - Pager - [phone]  After 6pm go to [email or web address] - password [email or web address]  [facility]  Office  [phone]  CC: Primary care physician; Patient, No Pcp Per

## 2018-01-13 NOTE — ED Notes (Signed)
Pt states prior  To arrival  Er pt had some nausea and vomiting x 2 then developed a headache

## 2018-01-13 NOTE — ED Notes (Signed)
Report phoned to lexie  Pt to be admitted to  Room 241

## 2018-01-13 NOTE — ED Notes (Signed)
Patient transported to X-ray 

## 2018-01-13 NOTE — ED Triage Notes (Signed)
PT arrived from home with wife with concerns over discomfort/cramp in pt's chest. 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 PT woke up from nap diaphoretic and felt the discomfort in his chest which made him concerned. Pt took 4 asa prior to arrival. Pt hypotensive in triage.

## 2018-01-13 NOTE — ED Notes (Signed)
Weak   Headache   Chest  Feels heavy   Pt states  He worked  Today 1/2 day   Pt reports  Has  Decreased  Urination  He is awake and alert and oriented

## 2018-01-13 NOTE — ED Provider Notes (Signed)
[facility] Emergency Department Provider Note    ____________________________________________   I have reviewed the triage vital signs and the nursing notes.   HISTORY  Chief Complaint Body cramping  History limited by: Not Limited   HPI Bryce [name] Tissue is a 52 y.o. male who presents to the emergency department today because of concern for cramping. 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 Worked until about 11 am. Bryce FlesherWent home showered, napped and when he woke up started having cramping. Located primarily in his chest. Denies any radiation. Did have a couple episodes of diaphoresis. Has had heart attacks in the past but denies that they felt similar to him. He tried to do a good job with hydration today but worries that he did sweat a lot. Has had to be admitted for dehydration in the past.   Per medical record review patient has a history of admission for aki in the past.   Past Medical History:  Diagnosis Date  . Chronic systolic congestive heart failure (HCC)   . Coronary artery disease   . Hypertension   . Ischemic cardiomyopathy   . MI (myocardial infarction) [facility](HCC)     Patient Active Problem List   Diagnosis Date Noted  . CAD (coronary artery disease) 02/20/2017  . Dehydration 02/20/2017  . STEMI (ST elevation myocardial infarction) (HCC) 02/02/2017  . AKI (acute kidney injury) (HCC) 12/15/2015  . Solitary pulmonary nodule 12/23/2014  . Chest pain 12/22/2014  . Essential hypertension 12/22/2014  . Hyperlipidemia 12/22/2014    Past Surgical History:  Procedure Laterality Date  . CARDIAC CATHETERIZATION N/A 12/28/2014   Procedure: Left Heart Cath and Coronary Angiography;  Surgeon: Alwyn Peawayne D Callwood, MD;  Location: [facility] INVASIVE CV LAB;  Service: Cardiovascular;  Laterality: N/A;  . CORONARY ANGIOPLASTY     Non-ST elevation MI status post stenting within bare-metal stent of the 75% lesion of the LAD,  11/13/2010.   . CORONARY STENT INTERVENTION N/A 02/02/2017   Procedure: CORONARY/GRAFT ACUTE MI REVASCULARIZATION;  Surgeon: Alwyn Peaallwood, Dwayne D, MD;  Location: [facility] INVASIVE CV LAB;  Service: Cardiovascular;  Laterality: N/A;  . LEFT HEART CATH AND CORONARY ANGIOGRAPHY N/A 02/02/2017   Procedure: LEFT HEART CATH AND CORONARY ANGIOGRAPHY;  Surgeon: Alwyn Peaallwood, Dwayne D, MD;  Location: [facility] INVASIVE CV LAB;  Service: Cardiovascular;  Laterality: N/A;    Prior to Admission medications   Medication Sig Start Date End Date Taking? Authorizing Provider  aspirin EC 81 MG EC tablet Take 1 tablet (81 mg total) by mouth daily. 02/05/17   Katha HammingKonidena, Snehalatha, MD  clopidogrel (PLAVIX) 75 MG tablet Take 1 tablet (75 mg total) by mouth daily. 02/05/17   Katha HammingKonidena, Snehalatha, MD  lisinopril (PRINIVIL,ZESTRIL) 20 MG tablet Take 20 mg by mouth daily.    [provider]  meloxicam (MOBIC) 15 MG tablet Take 1 tablet (15 mg total) by mouth daily. 09/09/17 09/09/18  Tommi RumpsSummers, Rhonda L, PA-C  metoprolol tartrate (LOPRESSOR) 25 MG tablet Take 25 mg by mouth 2 (two) times daily.    [provider]  nitroGLYCERIN (NITROSTAT) 0.4 MG SL tablet Place 1 tablet (0.4 mg total) under the tongue every 5 (five) minutes x 3 doses as needed for chest pain. 02/04/17   Katha HammingKonidena, Snehalatha, MD  pantoprazole (PROTONIX) 40 MG tablet Take 1 tablet (40 mg total) by mouth daily. 02/05/17   Katha HammingKonidena, Snehalatha, MD  pravastatin (PRAVACHOL) 40 MG tablet Take 40 mg by mouth daily.  [provider]  predniSONE (DELTASONE) 10 MG tablet Take 6 tablets  today, on day 2 take 5 tablets, day 3 take 4 tablets, day 4 take 3 tablets, day 5 take  2 tablets and 1 tablet the last day 09/09/17   Tommi RumpsSummers, Rhonda L, PA-C    Allergies Atorvastatin  Family History  Problem Relation Age of Onset  . CAD Father   . Kidney disease Neg Hx   . Diabetes Mellitus II Neg Hx     Social History Social History   Tobacco Use  . Smoking status: Former  Smoker    Packs/day: 1.00    Years: 5.00    Pack years: 5.00    Types: Cigarettes    Last attempt to quit: 01/05/2017    Years since quitting: 1.0  . Smokeless tobacco: Never Used  Substance Use Topics  . Alcohol use: Yes    Comment: occassionally  . Drug use: Yes    Types: Marijuana    Review of Systems Constitutional: No fever/chills Eyes: No visual changes. ENT: No sore throat. Cardiovascular: Positive for chest cramping. Respiratory: Denies shortness of breath. Gastrointestinal: No abdominal pain.  No nausea, no vomiting.  No diarrhea.   Genitourinary: Negative for dysuria. Musculoskeletal: Negative for back pain. Skin: Negative for rash. Neurological: Negative for headaches, focal weakness or numbness.  ____________________________________________   PHYSICAL EXAM:  VITAL SIGNS: ED Triage Vitals  Enc Vitals Group     BP 01/13/18 1520 (!) 89/57     Pulse Rate 01/13/18 1520 66     Resp 01/13/18 1520 20     Temp 01/13/18 1520 98.3 F (36.8 C)     Temp Source 01/13/18 1520 Oral     SpO2 01/13/18 1520 96 %     Weight 01/13/18 1525 240 lb 1.3 oz (108.9 kg)     Height 01/13/18 1525 5\' 2"  (1.575 m)     Head Circumference --      Peak Flow --      Pain Score 01/13/18 1525 4   Constitutional: Alert and oriented.  Eyes: Conjunctivae are normal.  ENT      Head: Normocephalic and atraumatic.      Nose: No congestion/rhinnorhea.      Mouth/Throat: Mucous membranes are moist.      Neck: No stridor. Hematological/Lymphatic/Immunilogical: No cervical lymphadenopathy. Cardiovascular: Normal rate, regular rhythm.  No murmurs, rubs, or gallops.  Respiratory: Normal respiratory effort without tachypnea nor retractions. Breath sounds are clear and equal bilaterally. No wheezes/rales/rhonchi. Gastrointestinal: Soft and non tender. No rebound. No guarding.  Genitourinary: Deferred Musculoskeletal: Normal range of motion in all extremities. No lower extremity edema. Neurologic:   Normal speech and language. No gross focal neurologic deficits are appreciated.  Skin:  Skin is warm, dry and intact. No rash noted. Psychiatric: Mood and affect are normal. Speech and behavior are normal. Patient exhibits appropriate insight and judgment.  ____________________________________________    LABS (pertinent positives/negatives)  BMP na 135, k 3.6, glu 156, cr 2.92 CBC wbc 11.4, hgb 14.6, plt 204 Trop <0.03 CK 341  ____________________________________________   EKG  I, Phineas SemenGraydon Arlin Savona, attending physician, personally viewed and interpreted this EKG  EKG Time: 1512 Rate: 64 Rhythm: normal sinus rhythm Axis: normal Intervals: qtc 468 QRS: incomplete RBBB ST changes: no st elevation Impression: abnormal ekg   ____________________________________________    RADIOLOGY  CXR No acute disease  ____________________________________________   PROCEDURES  Procedures  ____________________________________________   INITIAL IMPRESSION / ASSESSMENT AND PLAN / ED  COURSE  Pertinent labs & imaging results that were available during my care of the patient were reviewed by me and considered in my medical decision making (see chart for details).   Presented to the emergency department today because of concerns for some chest discomfort.  Patient was noted to be somewhat hypotensive on arrival.  Additionally patient's creatinine is quite elevated.  Concerns for AKI dehydration.  Will start IV fluids.  Will plan on admission. Discussed findings and plans with patient.   ____________________________________________   FINAL CLINICAL IMPRESSION(S) / ED DIAGNOSES  Final diagnoses:  AKI (acute kidney injury) (HCC)     Note: This dictation was prepared with Dragon dictation. Any transcriptional errors that result from this process are unintentional     Phineas Semen, MD 01/13/18 1742

## 2018-01-14 ENCOUNTER — Inpatient Hospital Stay: Payer: Self-pay

## 2018-01-14 LAB — BASIC METABOLIC PANEL
ANION GAP: 6 (ref 5–15)
BUN: 38 mg/dL — AB (ref 6–20)
CALCIUM: 9 mg/dL (ref 8.9–10.3)
CO2: 25 mmol/L (ref 22–32)
CREATININE: 1.98 mg/dL — AB (ref 0.61–1.24)
Chloride: 108 mmol/L (ref 98–111)
GFR calc Af Amer: 43 mL/min — ABNORMAL LOW (ref >60)
GFR, EST NON AFRICAN AMERICAN: 37 mL/min — AB (ref >60)
GLUCOSE: 120 mg/dL — AB (ref 70–99)
Potassium: 3.7 mmol/L (ref 3.5–5.1)
Sodium: 139 mmol/L (ref 135–145)

## 2018-01-14 LAB — TROPONIN I

## 2018-01-14 NOTE — Progress Notes (Signed)
Pt complaining of migraine, pt was given 650mg  of PO tylenol in ED, has not helped his pain. Head is still a 9 out of 10. MD paged, Dr. Cherlynn KaiserSainani gave orders for 2 tablets of fioricet q6hr PRN. Will give and continue to monitor. Shirley FriarAlexis Miller, RN, BSN

## 2018-01-14 NOTE — Plan of Care (Signed)
  Problem: Clinical Measurements: Goal: Diagnostic test results will improve Outcome: Progressing Note:  BUN & creatine improving   Problem: Activity: Goal: Risk for activity intolerance will decrease Outcome: Progressing   Problem: Pain Managment: Goal: General experience of comfort will improve Outcome: Progressing Note:  Fioricet given for migraine     Problem: Safety: Goal: Ability to remain free from injury will improve Outcome: Progressing   Problem: Skin Integrity: Goal: Risk for impaired skin integrity will decrease Outcome: Progressing   Problem: Education: Goal: Knowledge of General Education information will improve Description Including pain rating scale, medication(s)/side effects and non-pharmacologic comfort measures Outcome: Completed/Met

## 2018-01-14 NOTE — Care Management Note (Signed)
Case Management Note  Patient Details  Name: Bryce [name] MRN: [id number] Date of Birth: Feb 15, 1966  Subjective/Objective:                 Patient is currently in good standing with [facility].  He has not had to go to [facility].  Discussed that his current [facility] application would suffice for [facility] and would just have to provide [facility] with a utility bill per Smiley HousemanLorrie Carter.    Action/Plan:   Expected Discharge Date:                  Expected Discharge Plan:     In-House Referral:     Discharge planning Services     Post Acute Care Choice:    Choice offered to:     DME Arranged:    DME Agency:     HH Arranged:    HH Agency:     Status of Service:     If discussed at [employer] Length of Stay Meetings, dates discussed:    Additional Comments:  Eber HongGreene, Joanie Duprey R, RN 01/14/2018, 2:42 PM

## 2018-01-14 NOTE — Progress Notes (Signed)
[employer] - [place] at [facility]                                                                                                                                                                                  Patient Demographics   Bryce [name], is a 52 y.o. male, DOB - Mar 03, 1966, JYN:[id number]  Admit date - 01/13/2018   Admitting Physician Houston Siren, MD  Outpatient Primary MD for the patient is Patient, No Pcp Per   LOS - 1  Subjective: Patient admitted with chest pain and acute renal failure chest pain resolved cardiac enzymes negative, patient doing better renal function improved   Review of Systems:   CONSTITUTIONAL: No documented fever. No fatigue, weakness. No weight gain, no weight loss.  EYES: No blurry or double vision.  ENT: No tinnitus. No postnasal drip. No redness of the oropharynx.  RESPIRATORY: No cough, no wheeze, no hemoptysis. No dyspnea.  CARDIOVASCULAR: No chest pain. No orthopnea. No palpitations. No syncope.  GASTROINTESTINAL: No nausea, no vomiting or diarrhea. No abdominal pain. No melena or hematochezia.  GENITOURINARY: No dysuria or hematuria.  ENDOCRINE: No polyuria or nocturia. No heat or cold intolerance.  HEMATOLOGY: No anemia. No bruising. No bleeding.  INTEGUMENTARY: No rashes. No lesions.  MUSCULOSKELETAL: No arthritis. No swelling. No gout.  NEUROLOGIC: No numbness, tingling, or ataxia. No seizure-type activity.  PSYCHIATRIC: No anxiety. No insomnia. No ADD.    Vitals:   Vitals:   01/13/18 2008 01/13/18 2045 01/14/18 0349 01/14/18 0804  BP: (!) 101/59 117/82 103/64 105/68  Pulse: 60 (!) 59 (!) 48 63  Resp: 18 18 18    Temp:  (!) 97.5 F (36.4 C) 98.1 F (36.7 C) 97.6 F (36.4 C)  TempSrc:  Oral  Oral  SpO2: 95% 99% 97% 99%  Weight:  102.6 kg    Height:  5\' 5"  (1.651 m)      Wt Readings from Last 3 Encounters:  01/13/18 102.6 kg  09/30/17 108.9 kg  09/19/17 108 kg     Intake/Output Summary (Last 24  hours) at 01/14/2018 1454 Last data filed at 01/14/2018 1024 Gross per 24 hour  Intake 2711.67 ml  Output 300 ml  Net 2411.67 ml    Physical Exam:   GENERAL: Pleasant-appearing in no apparent distress.  HEAD, EYES, EARS, NOSE AND THROAT: Atraumatic, normocephalic. Extraocular muscles are intact. Pupils equal and reactive to light. Sclerae anicteric. No conjunctival injection. No oro-pharyngeal erythema.  NECK: Supple. There is no jugular venous distention. No bruits, no lymphadenopathy, no thyromegaly.  HEART: Regular rate and rhythm,. No murmurs, no rubs, no clicks.  LUNGS: Clear to auscultation bilaterally. No rales or rhonchi.  No wheezes.  ABDOMEN: Soft, flat, nontender, nondistended. Has good bowel sounds. No hepatosplenomegaly appreciated.  EXTREMITIES: No evidence of any cyanosis, clubbing, or peripheral edema.  +2 pedal and radial pulses bilaterally.  NEUROLOGIC: The patient is alert, awake, and oriented x3 with no focal motor or sensory deficits appreciated bilaterally.  SKIN: Moist and warm with no rashes appreciated.  Psych: Not anxious, depressed LN: No inguinal LN enlargement    Antibiotics   Anti-infectives (From admission, onward)   None      Medications   Scheduled Meds: . aspirin EC  81 mg Oral Daily  . clopidogrel  75 mg Oral Daily  . heparin  5,000 Units Subcutaneous Q8H  . metoprolol tartrate  12.5 mg Oral BID  . pantoprazole  40 mg Oral Daily  . pravastatin  40 mg Oral Daily   Continuous Infusions: . sodium chloride 100 mL/hr at 01/14/18 0623   PRN Meds:.acetaminophen **OR** acetaminophen, butalbital-acetaminophen-caffeine, nitroGLYCERIN, ondansetron **OR** ondansetron (ZOFRAN) IV   Data Review:   Micro Results No results found for this or any previous visit (from the past 240 hour(s)).  Radiology Reports Dg Chest 2 View  Result Date: 01/13/2018 CLINICAL DATA:  Discomfort in chest.  Chest pain.  Diaphoresis. EXAM: CHEST - 2 VIEW COMPARISON:   Two-view chest x-ray 02/02/2017 FINDINGS: The heart size is normal. Chronic interstitial coarsening and scarring is stable. No superimposed edema or effusion is present. No focal airspace disease is present. The visualized soft tissues and bony thorax are unremarkable. Stable degenerative changes are present thoracic spine. IMPRESSION: 1. No acute cardiopulmonary disease or significant interval change. 2. Stable chronic interstitial coarsening and scarring. Electronically Signed   By: Marin Robertshristopher  Mattern M.D.   On: 01/13/2018 16:08   [place] Renal  Result Date: 01/14/2018 CLINICAL DATA:  Acute renal failure EXAM: RENAL / URINARY TRACT ULTRASOUND COMPLETE COMPARISON:  03/05/2017 FINDINGS: Right Kidney: Length: 11.1 cm. Echogenicity within normal limits. No mass or hydronephrosis visualized. Left Kidney: Length: 12.4 cm. Echogenicity within normal limits. No mass or hydronephrosis visualized. Bladder: Appears normal for degree of bladder distention. IMPRESSION: Normal renal ultrasound. Electronically Signed   By: Charlett NoseKevin  Dover M.D.   On: 01/14/2018 10:21     CBC Recent Labs  Lab 01/13/18 1533  WBC 11.4*  HGB 14.6  HCT 42.7  PLT 204  MCV 98.6  MCH 33.7  MCHC 34.2  RDW 14.7*    Chemistries  Recent Labs  Lab 01/13/18 1533 01/14/18 0249  NA 135 139  K 3.6 3.7  CL 101 108  CO2 22 25  GLUCOSE 156* 120*  BUN 37* 38*  CREATININE 2.92* 1.98*  CALCIUM 10.2 9.0   ------------------------------------------------------------------------------------------------------------------ estimated creatinine clearance is 47.5 mL/min (A) (by C-G formula based on SCr of 1.98 mg/dL (H)). ------------------------------------------------------------------------------------------------------------------ No results for input(s): HGBA1C in the last 72 hours. ------------------------------------------------------------------------------------------------------------------ No results for input(s): CHOL, HDL,  LDLCALC, TRIG, CHOLHDL, LDLDIRECT in the last 72 hours. ------------------------------------------------------------------------------------------------------------------ No results for input(s): TSH, T4TOTAL, T3FREE, THYROIDAB in the last 72 hours.  Invalid input(s): FREET3 ------------------------------------------------------------------------------------------------------------------ No results for input(s): VITAMINB12, FOLATE, FERRITIN, TIBC, IRON, RETICCTPCT in the last 72 hours.  Coagulation profile No results for input(s): INR, PROTIME in the last 168 hours.  No results for input(s): DDIMER in the last 72 hours.  Cardiac Enzymes Recent Labs  Lab 01/13/18 1905 01/13/18 2225 01/14/18 0249  TROPONINI <0.03 <0.03 <0.03   ------------------------------------------------------------------------------------------------------------------ Invalid input(s): POCBNP    Assessment & Plan   52 year old male with past medical  history of hypertension, hyperlipidemia, coronary artery disease status post stent placement, ischemic cardiomyopathy, previous history of MI, previous history of rhabdomyolysis who presents to the hospital due to diaphoresis, dizziness and vague chest pain.  1.  Acute renal failure-secondary to dehydration.   Continue IV fluids renal function improving Continue hold lisinopril  2.  Chest pain-   Continue aspirin, Plavix, metoprolol, Pravachol.  3.  Essential hypertension-continue metoprolol, hold lisinopril given the acute kidney injury.  4.  GERD-continue Protonix.  5. Hyperlipidemia - cont. Pravachol.       Code Status Orders  (From admission, onward)         Start     Ordered   01/13/18 2042  Full code  Continuous     01/13/18 2041        Code Status History    Date Active Date Inactive Code Status Order ID Comments User Context   03/05/2017 0817 03/06/2017 1647 Full Code [id number]  Ihor AustinPyreddy, Pavan, MD Inpatient   02/20/2017 2331  02/22/2017 1405 Full Code [id number]  Oralia ManisWillis, David, MD Inpatient   02/02/2017 1212 02/04/2017 1722 Full Code [id number]  Ramonita LabGouru, Aruna, MD Inpatient   02/02/2017 1212 02/02/2017 1212 Full Code [id number]  Shaune Pollackhen, Qing, MD Inpatient   12/15/2015 1851 12/16/2015 1408 Full Code [id number]  Clint GuyHower, Cletis Athensavid K, MD ED   12/28/2014 1313 12/28/2014 1917 Full Code [id number]  Alwyn Peaallwood, Dwayne D, MD Inpatient   12/27/2014 1347 12/28/2014 1313 Full Code [id number]  Milagros LollSudini, Srikar, MD ED   12/22/2014 1039 12/23/2014 1857 Full Code [id number]  Gale JourneyWalsh, Catherine P, MD Inpatient           Consults none   DVT Prophylaxis  Lovenox   Lab Results  Component Value Date   PLT 204 01/13/2018     Time Spent in minutes   35 minutes  Greater than 50% of time spent in care coordination and counseling patient regarding the condition and plan of care.   Auburn BilberryShreyang Kaleiah Kutzer M.D on 01/14/2018 at 2:54 PM  Between 7am to 6pm - Pager - [phone]  After 6pm go to [email or web address] - Social research officer, governmentpassword [email or web address]  [employer]   Office  [phone]

## 2018-01-15 LAB — BASIC METABOLIC PANEL
ANION GAP: 4 — AB (ref 5–15)
BUN: 17 mg/dL (ref 6–20)
CALCIUM: 8.8 mg/dL — AB (ref 8.9–10.3)
CO2: 25 mmol/L (ref 22–32)
Chloride: 112 mmol/L — ABNORMAL HIGH (ref 98–111)
Creatinine, Ser: 0.81 mg/dL (ref 0.61–1.24)
GFR calc non Af Amer: >60 mL/min (ref >60)
GLUCOSE: 95 mg/dL (ref 70–99)
Potassium: 4.3 mmol/L (ref 3.5–5.1)
Sodium: 141 mmol/L (ref 135–145)

## 2018-01-15 MED ORDER — ACYCLOVIR 5 % EX OINT
TOPICAL_OINTMENT | CUTANEOUS | Status: DC
  Administered 2018-01-15: 13:00:00 via TOPICAL
  Filled 2018-01-15 (×2): qty 15

## 2018-01-15 MED ORDER — DOCOSANOL 10 % EX CREA: 1.0000 "application " | TOPICAL_CREAM | Freq: Two times a day (BID) | CUTANEOUS | 0 refills | Status: DC

## 2018-01-15 NOTE — Progress Notes (Signed)
[employer] - [place] at [facility]    Bryce ArchRodney [name] was admitted to the Hospital on 01/13/2018 and Discharged  01/15/2018 and should be excused from work/school   for 5 days starting 01/13/2018 , may return to work/school without any restrictions.  Call Bryce [name] with questions.  Bryce [name] on 01/15/2018,at 12:15 PM  [employer] - [place] at [facility]    Office  [phone]

## 2018-01-15 NOTE — Discharge Summary (Signed)
[employer] - [place] at [facility], [place] y.o., DOB Apr 25, 1966, MRN [id number]. Admission date: 01/13/2018 Discharge Date 01/15/2018 Primary MD Patient, No Pcp Per Admitting Physician Houston Siren, MD  Admission Diagnosis  AKI (acute kidney injury) [facility]) [N17.9]  Discharge Diagnosis   Active Problems: Acute renal failure due to dehydration and weakness Chronic systolic CHF Coronary artery disease Hypertension History of MI History of ischemic cardiomyopathy Noncardiac chest pain  Hospital Course  Bryce [name]  is a 52 y.o. male with a known history of hypertension, coronary artery disease status post stent placement, chronic systolic CHF, ischemic cardiomyopathy, previous history of MI who presents to the hospital due to diaphoresis, dizziness and vague chest pain.  Patient was evaluated in the ER and noted to have acute renal failure patient was given IV fluids his lisinopril was held.  Patient has had similar presentations in the past.  With IV fluids his renal function normalized.  He is doing much better.            Consults  None  Significant Tests:  See full reports for all details     Dg Chest 2 View  Result Date: 01/13/2018 CLINICAL DATA:  Discomfort in chest.  Chest pain.  Diaphoresis. EXAM: CHEST - 2 VIEW COMPARISON:  Two-view chest x-ray 02/02/2017 FINDINGS: The heart size is normal. Chronic interstitial coarsening and scarring is stable. No superimposed edema or effusion is present. No focal airspace disease is present. The visualized soft tissues and bony thorax are unremarkable. Stable degenerative changes are present thoracic spine. IMPRESSION: 1. No acute cardiopulmonary disease or significant interval change. 2. Stable chronic interstitial coarsening and scarring. Electronically Signed   By: Marin Roberts M.D.   On: 01/13/2018 16:08   US Renal  Result Date: 01/14/2018 CLINICAL DATA:  Acute renal failure EXAM: RENAL /  URINARY TRACT ULTRASOUND COMPLETE COMPARISON:  03/05/2017 FINDINGS: Right Kidney: Length: 11.1 cm. Echogenicity within normal limits. No mass or hydronephrosis visualized. Left Kidney: Length: 12.4 cm. Echogenicity within normal limits. No mass or hydronephrosis visualized. Bladder: Appears normal for degree of bladder distention. IMPRESSION: Normal renal ultrasound. Electronically Signed   By: Charlett Nose M.D.   On: 01/14/2018 10:21       Today   Subjective:   Bryce [name] denies any chest pain or shortness of breath Objective:   Blood pressure 115/78, pulse 61, temperature 98.7 F (37.1 C), temperature source Oral, resp. rate 20, height 5\' 5"  (1.651 m), weight 102.6 kg, SpO2 97 %.  .  Intake/Output Summary (Last 24 hours) at 01/15/2018 1335 Last data filed at 01/15/2018 1222 Gross per 24 hour  Intake 2400 ml  Output 3675 ml  Net -1275 ml    Exam VITAL SIGNS: Blood pressure 115/78, pulse 61, temperature 98.7 F (37.1 C), temperature source Oral, resp. rate 20, height 5\' 5"  (1.651 m), weight 102.6 kg, SpO2 97 %.  GENERAL:  52 y.o.-year-old patient lying in the bed with no acute distress.  EYES: Pupils equal, round, reactive to light and accommodation. No scleral icterus. Extraocular muscles intact.  HEENT: Head atraumatic, normocephalic. Oropharynx and nasopharynx clear.  NECK:  Supple, no jugular venous distention. No thyroid enlargement, no tenderness.  LUNGS: Normal breath sounds bilaterally, no wheezing, rales,rhonchi or crepitation. No use of accessory muscles of respiration.  CARDIOVASCULAR: S1, S2 normal. No murmurs, rubs, or gallops.  ABDOMEN: Soft, nontender, nondistended. Bowel sounds present. No organomegaly or mass.  EXTREMITIES: No pedal edema, cyanosis, or clubbing.  NEUROLOGIC: Cranial nerves II through XII are intact. Muscle strength 5/5 in all extremities. Sensation intact. Gait not checked.  PSYCHIATRIC: The patient is alert and oriented x 3.  SKIN: No obvious  rash, lesion, or ulcer.   Data Review     CBC w Diff:  Lab Results  Component Value Date   WBC 11.4 (H) 01/13/2018   HGB 14.6 01/13/2018   HGB 14.2 06/28/2014   HCT 42.7 01/13/2018   HCT 42.9 06/28/2014   PLT 204 01/13/2018   PLT 209 06/28/2014   LYMPHOPCT 31 03/05/2017   LYMPHOPCT 27.0 06/28/2014   MONOPCT 9 03/05/2017   MONOPCT 8.8 06/28/2014   EOSPCT 1 03/05/2017   EOSPCT 1.0 06/28/2014   BASOPCT 1 03/05/2017   BASOPCT 0.7 06/28/2014   CMP:  Lab Results  Component Value Date   NA 141 01/15/2018   NA 140 06/28/2014   K 4.3 01/15/2018   K 4.5 06/28/2014   CL 112 (H) 01/15/2018   CL 109 (H) 06/28/2014   CO2 25 01/15/2018   CO2 27 06/28/2014   BUN 17 01/15/2018   BUN 11 06/28/2014   CREATININE 0.81 01/15/2018   CREATININE 0.90 06/28/2014   PROT 7.4 09/19/2017   PROT 6.8 06/28/2014   ALBUMIN 4.2 09/19/2017   ALBUMIN 3.6 06/28/2014   BILITOT 1.2 09/19/2017   BILITOT 0.5 06/28/2014   ALKPHOS 62 09/19/2017   ALKPHOS 67 06/28/2014   AST 24 09/19/2017   AST 29 06/28/2014   ALT 36 09/19/2017   ALT 34 06/28/2014  .  Micro Results No results found for this or any previous visit (from the past 240 hour(s)).      Code Status Orders  (From admission, onward)         Start     Ordered   01/13/18 2042  Full code  Continuous     01/13/18 2041        Code Status History    Date Active Date Inactive Code Status Order ID Comments User Context   03/05/2017 0817 03/06/2017 1647 Full Code [id number]  Ihor AustinPyreddy, Pavan, MD Inpatient   02/20/2017 2331 02/22/2017 1405 Full Code [id number]  Oralia ManisWillis, David, MD Inpatient   02/02/2017 1212 02/04/2017 1722 Full Code [id number]  Ramonita LabGouru, Aruna, MD Inpatient   02/02/2017 1212 02/02/2017 1212 Full Code [id number]  Shaune Pollackhen, Qing, MD Inpatient   12/15/2015 1851 12/16/2015 1408 Full Code [id number]  Wyatt HasteHower, David K, MD ED   12/28/2014 1313 12/28/2014 1917 Full Code [id number]  Alwyn Peaallwood, Dwayne D, MD Inpatient   12/27/2014 1347 12/28/2014 1313 Full Code  [id number]  Milagros LollSudini, Srikar, MD ED   12/22/2014 1039 12/23/2014 1857 Full Code [id number]  Gale JourneyWalsh, Catherine P, MD Inpatient          Follow-up Information    [facility] On 01/22/2018.   Specialty:  Primary Care Why:  Appointment Time: @ 6:15pm Contact information: [address] [place] [id number] [phone]          Discharge Medications   Allergies as of 01/15/2018      Reactions   Atorvastatin    Myalgias      Medication List    TAKE these medications   aspirin 81 MG EC tablet Take 1 tablet (81 mg total) by mouth daily.   clopidogrel 75 MG tablet Commonly known as:  PLAVIX Take 1 tablet (75 mg total) by mouth daily.   Docosanol 10 % Crea Apply 1 application topically  2 (two) times daily.   lisinopril 20 MG tablet Commonly known as:  PRINIVIL,ZESTRIL Take 20 mg by mouth daily.   meloxicam 15 MG tablet Commonly known as:  MOBIC Take 1 tablet (15 mg total) by mouth daily. What changed:    when to take this  reasons to take this   metoprolol tartrate 25 MG tablet Commonly known as:  LOPRESSOR Take 12.5 mg by mouth 2 (two) times daily.   nitroGLYCERIN 0.4 MG SL tablet Commonly known as:  NITROSTAT Place 1 tablet (0.4 mg total) under the tongue every 5 (five) minutes x 3 doses as needed for chest pain.   pantoprazole 40 MG tablet Commonly known as:  PROTONIX Take 1 tablet (40 mg total) by mouth daily.   pravastatin 40 MG tablet Commonly known as:  PRAVACHOL Take 40 mg by mouth daily.          Total Time in preparing paper work, data evaluation and todays exam - 35 minutes  Auburn BilberryShreyang Destine Ambroise M.D on 01/15/2018 at 1:35 PM [employer]   Office  [phone]

## 2018-01-15 NOTE — Progress Notes (Signed)
Discharge instructions explained to pt and pts spouse/ verbalized an understanding/ iv and tele removed/ work note given to pt/ transported off unit via wheelchair.

## 2018-01-21 ENCOUNTER — Telehealth: Payer: Self-pay

## 2018-01-21 NOTE — Telephone Encounter (Signed)
[facility] Follow-up: I received a voice message from this patient but had to leave a message on his voice mail.  I left my contact information so he could call me back if he had a concern.

## 2018-01-21 NOTE — Telephone Encounter (Signed)
[facility] Follow up Noted on report that pt. was not able to be contacted.  Spoke with patient and appt. is scheduled with [facility] on 8/22 at 6:15pm.  Pt. Was notified he would receive a second automated phone call in a couple of days.  No further needs noted.

## 2018-01-22 ENCOUNTER — Ambulatory Visit: Payer: Self-pay | Admitting: Adult Health Nurse Practitioner

## 2018-01-22 VITALS — BP 119/80 | HR 67 | Temp 98.2°F | Ht 66.0 in | Wt 236.9 lb

## 2018-01-22 DIAGNOSIS — E785 Hyperlipidemia, unspecified: Secondary | ICD-10-CM

## 2018-01-22 DIAGNOSIS — N179 Acute kidney failure, unspecified: Secondary | ICD-10-CM

## 2018-01-22 DIAGNOSIS — I10 Essential (primary) hypertension: Secondary | ICD-10-CM

## 2018-01-22 NOTE — Progress Notes (Signed)
   Subjective:    Patient ID: Bryce [name], male    DOB: 26-Mar-1966, 52 y.o.   MRN: [id number]  HPI  Bryce [name] is a 52 yo M here for f/u from ED to Hospitaliztion from 8/13-8/15/2019 for AKI. He was given IV fluids and was stabilizied. He reports he is feeling better but he still has low energy. He reports feelings of depression from low energy and unemployment. He denies intention to self-harm.  Pt endorses urination but still not as frequent. He endorses dark urine and denies pain and blood. He endorses drinking water.   Patient Active Problem List   Diagnosis Date Noted  . Acute renal failure (ARF) (HCC) 01/13/2018  . CAD (coronary artery disease) 02/20/2017  . Dehydration 02/20/2017  . STEMI (ST elevation myocardial infarction) (HCC) 02/02/2017  . AKI (acute kidney injury) (HCC) 12/15/2015  . Solitary pulmonary nodule 12/23/2014  . Chest pain 12/22/2014  . Hypertension 12/22/2014  . Hyperlipidemia 12/22/2014   Allergies as of 01/22/2018      Reactions   Atorvastatin    Myalgias      Medication List        Accurate as of 01/22/18  6:54 PM. Always use your most recent med list.          aspirin 81 MG EC tablet Take 1 tablet (81 mg total) by mouth daily.   clopidogrel 75 MG tablet Commonly known as:  PLAVIX Take 1 tablet (75 mg total) by mouth daily.   Docosanol 10 % Crea Apply 1 application topically 2 (two) times daily.   lisinopril 20 MG tablet Commonly known as:  PRINIVIL,ZESTRIL Take 20 mg by mouth daily.   meloxicam 15 MG tablet Commonly known as:  MOBIC Take 1 tablet (15 mg total) by mouth daily.   metoprolol tartrate 25 MG tablet Commonly known as:  LOPRESSOR Take 12.5 mg by mouth 2 (two) times daily.   nitroGLYCERIN 0.4 MG SL tablet Commonly known as:  NITROSTAT Place 1 tablet (0.4 mg total) under the tongue every 5 (five) minutes x 3 doses as needed for chest pain.   pantoprazole 40 MG tablet Commonly known as:  PROTONIX Take 1 tablet  (40 mg total) by mouth daily.   pravastatin 40 MG tablet Commonly known as:  PRAVACHOL Take 40 mg by mouth daily.        Review of Systems  All other systems reviewed and are negative.      Objective:   Physical Exam  Constitutional: He is oriented to person, place, and time. He appears well-developed and well-nourished.  Cardiovascular: Normal rate, regular rhythm and normal heart sounds.  Pulmonary/Chest: Effort normal and breath sounds normal.  Abdominal: Soft. Bowel sounds are normal. There is no CVA tenderness.  Neurological: He is alert and oriented to person, place, and time.  Psychiatric: He has a normal mood and affect. His behavior is normal. Judgment and thought content normal.  Vitals reviewed.   BP 119/80   Pulse 67   Temp 98.2 F (36.8 C)   Ht 5\' 6"  (1.676 m)   Wt 236 lb 14.4 oz (107.5 kg)   BMI 38.24 kg/m        Assessment & Plan:   Routine labs tonight.  Encouraged pt to continue to drink plenty of water. Discussed w/ pt if he does not urinate within 12 hours to call us or go to ED.   F/u in 2 mo for routine care.

## 2018-01-23 LAB — COMPREHENSIVE METABOLIC PANEL
A/G RATIO: 1.7 (ref 1.2–2.2)
ALK PHOS: 62 IU/L (ref 39–117)
ALT: 35 IU/L (ref 0–44)
AST: 19 IU/L (ref 0–40)
Albumin: 4.4 g/dL (ref 3.5–5.5)
BUN/Creatinine Ratio: 17 (ref 9–20)
BUN: 13 mg/dL (ref 6–24)
CHLORIDE: 102 mmol/L (ref 96–106)
CO2: 22 mmol/L (ref 20–29)
Calcium: 9.5 mg/dL (ref 8.7–10.2)
Creatinine, Ser: 0.77 mg/dL (ref 0.76–1.27)
GFR calc Af Amer: 120 mL/min/{1.73_m2} (ref >59)
GFR calc non Af Amer: 104 mL/min/{1.73_m2} (ref >59)
GLOBULIN, TOTAL: 2.6 g/dL (ref 1.5–4.5)
Glucose: 104 mg/dL — ABNORMAL HIGH (ref 65–99)
POTASSIUM: 4.9 mmol/L (ref 3.5–5.2)
SODIUM: 143 mmol/L (ref 134–144)
Total Protein: 7 g/dL (ref 6.0–8.5)

## 2018-01-23 LAB — CBC
HEMATOCRIT: 41.5 % (ref 37.5–51.0)
Hemoglobin: 14.1 g/dL (ref 13.0–17.7)
MCH: 32.6 pg (ref 26.6–33.0)
MCHC: 34 g/dL (ref 31.5–35.7)
MCV: 96 fL (ref 79–97)
Platelets: 217 10*3/uL (ref 150–450)
RBC: 4.32 x10E6/uL (ref 4.14–5.80)
RDW: 13.7 % (ref 12.3–15.4)
WBC: 5.4 10*3/uL (ref 3.4–10.8)

## 2018-01-23 LAB — LIPID PANEL
CHOLESTEROL TOTAL: 232 mg/dL — AB (ref 100–199)
Chol/HDL Ratio: 5 ratio (ref 0.0–5.0)
HDL: 46 mg/dL (ref >39)
LDL Calculated: 110 mg/dL — ABNORMAL HIGH (ref 0–99)
TRIGLYCERIDES: 382 mg/dL — AB (ref 0–149)
VLDL Cholesterol Cal: 76 mg/dL — ABNORMAL HIGH (ref 5–40)

## 2018-01-23 LAB — HEMOGLOBIN A1C
Est. average glucose Bld gHb Est-mCnc: 108 mg/dL
Hgb A1c MFr Bld: 5.4 % (ref 4.8–5.6)

## 2018-01-29 ENCOUNTER — Other Ambulatory Visit: Payer: Self-pay | Admitting: Adult Health Nurse Practitioner

## 2018-01-29 MED ORDER — LISINOPRIL 20 MG PO TABS: 20.0000 mg | ORAL_TABLET | Freq: Every day | ORAL | 2 refills | Status: DC

## 2018-02-03 ENCOUNTER — Telehealth: Payer: Self-pay | Admitting: Adult Health Nurse Practitioner

## 2018-03-24 ENCOUNTER — Ambulatory Visit: Payer: Self-pay

## 2018-03-31 ENCOUNTER — Ambulatory Visit: Payer: Medicaid Other

## 2018-04-02 ENCOUNTER — Encounter: Payer: Self-pay | Admitting: Gerontology

## 2018-04-02 ENCOUNTER — Other Ambulatory Visit: Payer: Self-pay

## 2018-04-02 ENCOUNTER — Ambulatory Visit: Payer: Medicaid Other | Admitting: Gerontology

## 2018-04-02 VITALS — BP 138/89 | HR 68 | Temp 98.1°F | Wt 242.2 lb

## 2018-04-02 DIAGNOSIS — Z8719 Personal history of other diseases of the digestive system: Secondary | ICD-10-CM

## 2018-04-02 DIAGNOSIS — M5136 Other intervertebral disc degeneration, lumbar region: Secondary | ICD-10-CM

## 2018-04-02 DIAGNOSIS — I10 Essential (primary) hypertension: Secondary | ICD-10-CM

## 2018-04-02 DIAGNOSIS — E785 Hyperlipidemia, unspecified: Secondary | ICD-10-CM

## 2018-04-02 DIAGNOSIS — N179 Acute kidney failure, unspecified: Secondary | ICD-10-CM

## 2018-04-02 DIAGNOSIS — Z0000 Encounter for general adult medical examination without abnormal findings: Secondary | ICD-10-CM

## 2018-04-02 DIAGNOSIS — I2129 ST elevation (STEMI) myocardial infarction involving other sites: Secondary | ICD-10-CM

## 2018-04-02 DIAGNOSIS — Z6379 Other stressful life events affecting family and household: Secondary | ICD-10-CM

## 2018-04-02 DIAGNOSIS — M51369 Other intervertebral disc degeneration, lumbar region without mention of lumbar back pain or lower extremity pain: Secondary | ICD-10-CM

## 2018-04-02 MED ORDER — PRAVASTATIN SODIUM 40 MG PO TABS: 40.0000 mg | ORAL_TABLET | Freq: Every day | ORAL | 3 refills | Status: DC

## 2018-04-02 MED ORDER — LISINOPRIL 20 MG PO TABS: 20.0000 mg | ORAL_TABLET | Freq: Every day | ORAL | 2 refills | Status: DC

## 2018-04-02 MED ORDER — MELOXICAM 15 MG PO TABS: 15.0000 mg | ORAL_TABLET | Freq: Every day | ORAL | 3 refills | Status: DC | PRN

## 2018-04-02 NOTE — Patient Instructions (Signed)
Calorie Counting for Weight Loss Calories are units of energy. Your body needs a certain amount of calories from food to keep you going throughout the day. When you eat more calories than your body needs, your body stores the extra calories as fat. When you eat fewer calories than your body needs, your body burns fat to get the energy it needs. Calorie counting means keeping track of how many calories you eat and drink each day. Calorie counting can be helpful if you need to lose weight. If you make sure to eat fewer calories than your body needs, you should lose weight. Ask your health care provider what a healthy weight is for you. For calorie counting to work, you will need to eat the right number of calories in a day in order to lose a healthy amount of weight per week. A dietitian can help you determine how many calories you need in a day and will give you suggestions on how to reach your calorie goal.  A healthy amount of weight to lose per week is usually 1-2 lb (0.5-0.9 kg). This usually means that your daily calorie intake should be reduced by 500-750 calories.  Eating 1,200 - 1,500 calories per day can help most women lose weight.  Eating 1,500 - 1,800 calories per day can help most men lose weight.  What is my plan? My goal is to have __________ calories per day. If I have this many calories per day, I should lose around __________ pounds per week. What do I need to know about calorie counting? In order to meet your daily calorie goal, you will need to:  Find out how many calories are in each food you would like to eat. Try to do this before you eat.  Decide how much of the food you plan to eat.  Write down what you ate and how many calories it had. Doing this is called keeping a food log.  To successfully lose weight, it is important to balance calorie counting with a healthy lifestyle that includes regular activity. Aim for 150 minutes of moderate exercise (such as walking) or 75  minutes of vigorous exercise (such as running) each week. Where do I find calorie information?  The number of calories in a food can be found on a Nutrition Facts label. If a food does not have a Nutrition Facts label, try to look up the calories online or ask your dietitian for help. Remember that calories are listed per serving. If you choose to have more than one serving of a food, you will have to multiply the calories per serving by the amount of servings you plan to eat. For example, the label on a package of bread might say that a serving size is 1 slice and that there are 90 calories in a serving. If you eat 1 slice, you will have eaten 90 calories. If you eat 2 slices, you will have eaten 180 calories. How do I keep a food log? Immediately after each meal, record the following information in your food log:  What you ate. Don't forget to include toppings, sauces, and other extras on the food.  How much you ate. This can be measured in cups, ounces, or number of items.  How many calories each food and drink had.  The total number of calories in the meal.  Keep your food log near you, such as in a small notebook in your pocket, or use a mobile app or website. Some   programs will calculate calories for you and show you how many calories you have left for the day to meet your goal. What are some calorie counting tips?  Use your calories on foods and drinks that will fill you up and not leave you hungry: ? Some examples of foods that fill you up are nuts and nut butters, vegetables, lean proteins, and high-fiber foods like whole grains. High-fiber foods are foods with more than 5 g fiber per serving. ? Drinks such as sodas, specialty coffee drinks, alcohol, and juices have a lot of calories, yet do not fill you up.  Eat nutritious foods and avoid empty calories. Empty calories are calories you get from foods or beverages that do not have many vitamins or protein, such as candy, sweets, and  soda. It is better to have a nutritious high-calorie food (such as an avocado) than a food with few nutrients (such as a bag of chips).  Know how many calories are in the foods you eat most often. This will help you calculate calorie counts faster.  Pay attention to calories in drinks. Low-calorie drinks include water and unsweetened drinks.  Pay attention to nutrition labels for "low fat" or "fat free" foods. These foods sometimes have the same amount of calories or more calories than the full fat versions. They also often have added sugar, starch, or salt, to make up for flavor that was removed with the fat.  Find a way of tracking calories that works for you. Get creative. Try different apps or programs if writing down calories does not work for you. What are some portion control tips?  Know how many calories are in a serving. This will help you know how many servings of a certain food you can have.  Use a measuring cup to measure serving sizes. You could also try weighing out portions on a kitchen scale. With time, you will be able to estimate serving sizes for some foods.  Take some time to put servings of different foods on your favorite plates, bowls, and cups so you know what a serving looks like.  Try not to eat straight from a bag or box. Doing this can lead to overeating. Put the amount you would like to eat in a cup or on a plate to make sure you are eating the right portion.  Use smaller plates, glasses, and bowls to prevent overeating.  Try not to multitask (for example, watch TV or use your computer) while eating. If it is time to eat, sit down at a table and enjoy your food. This will help you to know when you are full. It will also help you to be aware of what you are eating and how much you are eating. What are tips for following this plan? Reading food labels  Check the calorie count compared to the serving size. The serving size may be smaller than what you are used to  eating.  Check the source of the calories. Make sure the food you are eating is high in vitamins and protein and low in saturated and trans fats. Shopping  Read nutrition labels while you shop. This will help you make healthy decisions before you decide to purchase your food.  Make a grocery list and stick to it. Cooking  Try to cook your favorite foods in a healthier way. For example, try baking instead of frying.  Use low-fat dairy products. Meal planning  Use more fruits and vegetables. Half of your plate should   be fruits and vegetables.  Include lean proteins like poultry and fish. How do I count calories when eating out?  Ask for smaller portion sizes.  Consider sharing an entree and sides instead of getting your own entree.  If you get your own entree, eat only half. Ask for a box at the beginning of your meal and put the rest of your entree in it so you are not tempted to eat it.  If calories are listed on the menu, choose the lower calorie options.  Choose dishes that include vegetables, fruits, whole grains, low-fat dairy products, and lean protein.  Choose items that are boiled, broiled, grilled, or steamed. Stay away from items that are buttered, battered, fried, or served with cream sauce. Items labeled "crispy" are usually fried, unless stated otherwise.  Choose water, low-fat milk, unsweetened iced tea, or other drinks without added sugar. If you want an alcoholic beverage, choose a lower calorie option such as a glass of wine or light beer.  Ask for dressings, sauces, and syrups on the side. These are usually high in calories, so you should limit the amount you eat.  If you want a salad, choose a garden salad and ask for grilled meats. Avoid extra toppings like bacon, cheese, or fried items. Ask for the dressing on the side, or ask for olive oil and vinegar or lemon to use as dressing.  Estimate how many servings of a food you are given. For example, a serving of  cooked rice is  cup or about the size of half a baseball. Knowing serving sizes will help you be aware of how much food you are eating at restaurants. The list below tells you how big or small some common portion sizes are based on everyday objects: ? 1 oz-4 stacked dice. ? 3 oz-1 deck of cards. ? 1 tsp-1 die. ? 1 Tbsp- a ping-pong ball. ? 2 Tbsp-1 ping-pong ball. ?  cup- baseball. ? 1 cup-1 baseball. Summary  Calorie counting means keeping track of how many calories you eat and drink each day. If you eat fewer calories than your body needs, you should lose weight.  A healthy amount of weight to lose per week is usually 1-2 lb (0.5-0.9 kg). This usually means reducing your daily calorie intake by 500-750 calories.  The number of calories in a food can be found on a Nutrition Facts label. If a food does not have a Nutrition Facts label, try to look up the calories online or ask your dietitian for help.  Use your calories on foods and drinks that will fill you up, and not on foods and drinks that will leave you hungry.  Use smaller plates, glasses, and bowls to prevent overeating. This information is not intended to replace advice given to you by your health care provider. Make sure you discuss any questions you have with your health care provider. Document Released: 05/20/2005 Document Revised: 04/19/2016 Document Reviewed: 04/19/2016 Elsevier Interactive Patient Education  2018 [employer] Fat and Cholesterol Restricted Diet Getting too much fat and cholesterol in your diet may cause health problems. Following this diet helps keep your fat and cholesterol at normal levels. This can keep you from getting sick. What types of fat should I choose?  Choose monosaturated and polyunsaturated fats. These are found in foods such as olive oil, canola oil, flaxseeds, walnuts, almonds, and seeds.  Eat more omega-3 fats. Good choices include salmon, mackerel, sardines, tuna, flaxseed oil, and  ground flaxseeds.  Limit   saturated fats. These are in animal products such as meats, butter, and cream. They can also be in plant products such as palm oil, palm kernel oil, and coconut oil.  Avoid foods with partially hydrogenated oils in them. These contain trans fats. Examples of foods that have trans fats are stick margarine, some tub margarines, cookies, crackers, and other baked goods. What general guidelines do I need to follow?  Check food labels. Look for the words "trans fat" and "saturated fat."  When preparing a meal: ? Fill half of your plate with vegetables and green salads. ? Fill one fourth of your plate with whole grains. Look for the word "whole" as the first word in the ingredient list. ? Fill one fourth of your plate with lean protein foods.  Eat more foods that have fiber, like apples, carrots, beans, peas, and barley.  Eat more home-cooked foods. Eat less at restaurants and buffets.  Limit or avoid alcohol.  Limit foods high in starch and sugar.  Limit fried foods.  Cook foods without frying them. Baking, boiling, grilling, and broiling are all great options.  Lose weight if you are overweight. Losing even a small amount of weight can help your overall health. It can also help prevent diseases such as diabetes and heart disease. What foods can I eat? Grains Whole grains, such as whole wheat or whole grain breads, crackers, cereals, and pasta. Unsweetened oatmeal, bulgur, barley, quinoa, or brown rice. Corn or whole wheat flour tortillas. Vegetables Fresh or frozen vegetables (raw, steamed, roasted, or grilled). Green salads. Fruits All fresh, canned (in natural juice), or frozen fruits. Meat and Other Protein Products Ground beef (85% or leaner), grass-fed beef, or beef trimmed of fat. Skinless chicken or turkey. Ground chicken or turkey. Pork trimmed of fat. All fish and seafood. Eggs. Dried beans, peas, or lentils. Unsalted nuts or seeds. Unsalted canned or  dry beans. Dairy Low-fat dairy products, such as skim or 1% milk, 2% or reduced-fat cheeses, low-fat ricotta or cottage cheese, or plain low-fat yogurt. Fats and Oils Tub margarines without trans fats. Light or reduced-fat mayonnaise and salad dressings. Avocado. Olive, canola, sesame, or safflower oils. Natural peanut or almond butter (choose ones without added sugar and oil). The items listed above may not be a complete list of recommended foods or beverages. Contact your dietitian for more options. What foods are not recommended? Grains White bread. White pasta. White rice. Cornbread. Bagels, pastries, and croissants. Crackers that contain trans fat. Vegetables White potatoes. Corn. Creamed or fried vegetables. Vegetables in a cheese sauce. Fruits Dried fruits. Canned fruit in light or heavy syrup. Fruit juice. Meat and Other Protein Products Fatty cuts of meat. Ribs, chicken wings, bacon, sausage, bologna, salami, chitterlings, fatback, hot dogs, bratwurst, and packaged luncheon meats. Liver and organ meats. Dairy Whole or 2% milk, cream, half-and-half, and cream cheese. Whole milk cheeses. Whole-fat or sweetened yogurt. Full-fat cheeses. Nondairy creamers and whipped toppings. Processed cheese, cheese spreads, or cheese curds. Sweets and Desserts Corn syrup, sugars, honey, and molasses. Candy. Jam and jelly. Syrup. Sweetened cereals. Cookies, pies, cakes, donuts, muffins, and ice cream. Fats and Oils Butter, stick margarine, lard, shortening, ghee, or bacon fat. Coconut, palm kernel, or palm oils. Beverages Alcohol. Sweetened drinks (such as sodas, lemonade, and fruit drinks or punches). The items listed above may not be a complete list of foods and beverages to avoid. Contact your dietitian for more information. This information is not intended to replace advice given to you by   your health care provider. Make sure you discuss any questions you have with your health care  provider. Document Released: 11/19/2011 Document Revised: 01/25/2016 Document Reviewed: 08/19/2013 Elsevier Interactive Patient Education  2018 Elsevier Inc.  

## 2018-04-02 NOTE — Progress Notes (Signed)
Patient: Bryce [name] Male    DOB: 27-Aug-1965   52 y.o.   MRN: [id number] Visit Date: 04/02/2018  Today's Provider: Langston Reusing, NP   Chief Complaint  Patient presents with  . Follow-up    high blood pressure, medication questions   Subjective:    HPI Bryce [name] 52 y/o presents for follow up on AKI , hypertension, medication refill and lab review. 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 He states his urine output has increased,  And he drinks more water, denies dysuria, hematuria and urine color has improved. He states that he monitors his BP regularly. He reports 80 % improvement in his energy level.  Lab review: cMet is wnl, lipid panel is abnormal.  He states that he's under a lot of stress, lost his father 3 weeks ago, wife is in Afib due to her medication changes. He stays up at night monitoring her BP and scared to go to sleep. Denies chest pain, palpitation, shortness of breath.    Allergies  Allergen Reactions  . Atorvastatin     Myalgias   Previous Medications   ASPIRIN EC 81 MG EC TABLET    Take 1 tablet (81 mg total) by mouth daily.   CLOPIDOGREL (PLAVIX) 75 MG TABLET    Take 1 tablet (75 mg total) by mouth daily.   DOCOSANOL (ABREVA) 10 % CREA    Apply 1 application topically 2 (two) times daily.   LISINOPRIL (PRINIVIL,ZESTRIL) 20 MG TABLET    Take 1 tablet (20 mg total) by mouth daily.   MELOXICAM (MOBIC) 15 MG TABLET    Take 1 tablet (15 mg total) by mouth daily.   METOPROLOL TARTRATE (LOPRESSOR) 25 MG TABLET    Take 12.5 mg by mouth 2 (two) times daily.    NITROGLYCERIN (NITROSTAT) 0.4 MG SL TABLET    Place 1 tablet (0.4 mg total) under the tongue every 5 (five) minutes x 3 doses as needed for chest pain.   PANTOPRAZOLE (PROTONIX) 40 MG TABLET    Take 1 tablet (40 mg total) by mouth daily.   PRAVASTATIN (PRAVACHOL) 40 MG TABLET    Take 40 mg by mouth daily.    Review of Systems  Constitutional: Negative.   HENT: Negative.   Eyes:  Negative.   Respiratory: Negative.   Cardiovascular: Negative.   Gastrointestinal: Negative.   Genitourinary: Negative.   Musculoskeletal: Negative.   Skin: Negative.   Neurological: Negative.   Psychiatric/Behavioral: Negative.     Social History   Tobacco Use  . Smoking status: Current Every Day Smoker    Packs/day: 1.00    Years: 5.00    Pack years: 5.00    Types: Cigarettes    Last attempt to quit: 01/05/2017    Years since quitting: 1.2  . Smokeless tobacco: Never Used  Substance Use Topics  . Alcohol use: Not Currently   Objective:   BP 138/89 (BP Location: Left Arm, Patient Position: Sitting)   Pulse 68   Temp 98.1 F (36.7 C)   Wt 242 lb 3.2 oz (109.9 kg)   SpO2 96%   BMI 39.09 kg/m   Physical Exam  Constitutional: He is oriented to person, place, and time. He appears well-developed and well-nourished.  HENT:  Head: Normocephalic and atraumatic.  Eyes: Pupils are equal, round, and reactive to light. EOM are normal.  Neck: Normal range of motion.  Cardiovascular: Normal rate and regular rhythm.  Pulmonary/Chest: Effort normal and breath  sounds normal.  Abdominal: Soft. Bowel sounds are normal.  Musculoskeletal: Normal range of motion.  Neurological: He is alert and oriented to person, place, and time.  Skin: Skin is warm and dry.  Psychiatric: He has a normal mood and affect.        Assessment & Plan:     1. Essential hypertension  - lisinopril (PRINIVIL,ZESTRIL) 20 MG tablet; Take 1 tablet (20 mg total) by mouth daily.  Dispense: 30 tablet; Refill: 2 - Comp Met (CMET); Future - Urine Microalbumin w/creat. ratio; Future - Urinalysis; Future Encouraged to continues low salt diet, exercise 30 minutes daily.  2. ST elevation myocardial infarction (STEMI) involving other coronary artery (HCC) Continues to take Plavix 75 mg and  Aspirin 81 mg EC daily.  3. Hyperlipidemia, unspecified hyperlipidemia type Advised on low fat low cholesterol diet. -  pravastatin (PRAVACHOL) 40 MG tablet; Take 1 tablet (40 mg total) by mouth daily.  Dispense: 90 tablet; Refill: 3 - Lipid Profile; Future  4. AKI (acute kidney injury) ([place]) Increase water intake, low salt diet  5. H/O gastroesophageal reflux (GERD) Continues 40 mg Protonix daily for [place]. 6. Health care maintenance Declines flu vaccine Low fat low cholesterol diet  7. DDD (degenerative disc disease), lumbar  - meloxicam (MOBIC) 15 MG tablet; Take 1 tablet (15 mg total) by mouth daily as needed for pain.  Dispense: 30 tablet; Refill: 3 Advised to loss weight and exercise 30 minutes daily.  8. Stress due to illness of family member: Advised on grief counseling, states he will trying different coping mechanism first and will notify clinic if help is needed. He agrees to monitor wife's BP and HR once before going to bed.       Langston Reusing, NP   [facility]

## 2018-04-21 NOTE — Telephone Encounter (Signed)
Phone note left unsigned

## 2018-05-12 ENCOUNTER — Other Ambulatory Visit: Payer: Self-pay

## 2018-05-12 ENCOUNTER — Encounter: Payer: Self-pay | Admitting: Emergency Medicine

## 2018-05-12 ENCOUNTER — Emergency Department: Payer: Self-pay

## 2018-05-12 ENCOUNTER — Emergency Department
Admission: EM | Admit: 2018-05-12 | Discharge: 2018-05-12 | Disposition: A | Discharge status: Home / Self Care | Payer: Self-pay | Attending: Emergency Medicine | Admitting: Emergency Medicine

## 2018-05-12 DIAGNOSIS — Z79899 Other long term (current) drug therapy: Secondary | ICD-10-CM | POA: Insufficient documentation

## 2018-05-12 DIAGNOSIS — I5022 Chronic systolic (congestive) heart failure: Secondary | ICD-10-CM | POA: Insufficient documentation

## 2018-05-12 DIAGNOSIS — J069 Acute upper respiratory infection, unspecified: Secondary | ICD-10-CM | POA: Insufficient documentation

## 2018-05-12 DIAGNOSIS — I110 Hypertensive heart disease with heart failure: Secondary | ICD-10-CM | POA: Insufficient documentation

## 2018-05-12 DIAGNOSIS — F17210 Nicotine dependence, cigarettes, uncomplicated: Secondary | ICD-10-CM | POA: Insufficient documentation

## 2018-05-12 DIAGNOSIS — G43019 Migraine without aura, intractable, without status migrainosus: Secondary | ICD-10-CM

## 2018-05-12 DIAGNOSIS — Z7982 Long term (current) use of aspirin: Secondary | ICD-10-CM | POA: Insufficient documentation

## 2018-05-12 DIAGNOSIS — B9789 Other viral agents as the cause of diseases classified elsewhere: Secondary | ICD-10-CM

## 2018-05-12 DIAGNOSIS — I2510 Atherosclerotic heart disease of native coronary artery without angina pectoris: Secondary | ICD-10-CM | POA: Insufficient documentation

## 2018-05-12 DIAGNOSIS — G43909 Migraine, unspecified, not intractable, without status migrainosus: Secondary | ICD-10-CM | POA: Insufficient documentation

## 2018-05-12 HISTORY — DX: Migraine, unspecified, not intractable, without status migrainosus: G43.909

## 2018-05-12 MED ORDER — ASPIRIN-ACETAMINOPHEN-CAFFEINE 250-250-65 MG PO TABS: 1.0000 | ORAL_TABLET | Freq: Every day | ORAL | 0 refills | Status: DC | PRN

## 2018-05-12 MED ORDER — ACETAMINOPHEN 500 MG PO TABS
1000.0000 mg | ORAL_TABLET | Freq: Once | ORAL | Status: AC
  Administered 2018-05-12: 1000 mg via ORAL
  Filled 2018-05-12: qty 2

## 2018-05-12 MED ORDER — KETOROLAC TROMETHAMINE 30 MG/ML IJ SOLN
30.0000 mg | Freq: Once | INTRAMUSCULAR | Status: AC
  Administered 2018-05-12: 30 mg via INTRAMUSCULAR
  Filled 2018-05-12: qty 1

## 2018-05-12 MED ORDER — PREDNISONE 10 MG PO TABS: ORAL_TABLET | ORAL | 0 refills | Status: DC

## 2018-05-12 MED ORDER — SUMATRIPTAN SUCCINATE 6 MG/0.5ML ~~LOC~~ SOLN
6.0000 mg | Freq: Once | SUBCUTANEOUS | Status: AC
  Administered 2018-05-12: 6 mg via SUBCUTANEOUS
  Filled 2018-05-12: qty 0.5

## 2018-05-12 MED ORDER — ONDANSETRON 8 MG PO TBDP
8.0000 mg | ORAL_TABLET | Freq: Once | ORAL | Status: AC
  Administered 2018-05-12: 8 mg via ORAL

## 2018-05-12 MED ORDER — ONDANSETRON 8 MG PO TBDP
ORAL_TABLET | ORAL | Status: AC
  Filled 2018-05-12: qty 1

## 2018-05-12 MED ORDER — PREDNISONE 20 MG PO TABS
60.0000 mg | ORAL_TABLET | Freq: Once | ORAL | Status: AC
  Administered 2018-05-12: 60 mg via ORAL
  Filled 2018-05-12: qty 3

## 2018-05-12 NOTE — Care Management Note (Signed)
Case Management Note  Patient Details  Name: Bryce [name] MRN: [id number] Date of Birth: [birth year]  Subjective/Objective:    Patient is being seen in the ED for Migraine.  Patient reports that his last migraine was about 3 months ago.  RNCM in to see patient and evaluate PCP services.  [facility] and uses [facility] for [facility].  List of other [place] resources given, list includes services for food, shelter, legal, behavioral, and has a list of other indigent health clinics in [place].  Patient lives with his long time girlfriend of 18 years in [place].  They have a car and he works as a [employer], pt states he only makes $300/ week.  Patient and girlfriend have no questions for RNCM.      Robbie LisJeanna Precious Gilchrest RN BSN [phone]             Action/Plan:   Expected Discharge Date:                  Expected Discharge Plan:     In-House Referral:     Discharge planning Services     Post Acute Care Choice:    Choice offered to:     DME Arranged:    DME Agency:     HH Arranged:    HH Agency:     Status of Service:     If discussed at Long Length of Stay Meetings, dates discussed:    Additional Comments:  Allayne ButcherJeanna M Janica Eldred, RN 05/12/2018, 4:18 PM

## 2018-05-12 NOTE — ED Notes (Signed)
Pt also c/o head being stopped up and is frequently sniffling in triage.

## 2018-05-12 NOTE — ED Triage Notes (Signed)
Pt reports waking up with migraine headache this morning, states he took his last Excedrin migraine this morning around 0430.  Pt reports photosensitivity and nausea with the pain.

## 2018-05-12 NOTE — ED Triage Notes (Signed)
Woke up with migriane. Says he has to come get a shot if he wakes up with it. Feels similar to his migraines.

## 2018-05-12 NOTE — ED Notes (Signed)
Pt presents to ED from home with c/c of a migraine headache beginning this morning. Pt states that the pain woke him up this morning. Pt has a Hx of migraines with last occurrence approx 3 months ago. Pt usually self medicates with "excedrin migraine" but states it is usually ineffective if the headache starts in the morning so he didn't take it today. Pt has Hx of MI in Sept 2018 and HTN.

## 2018-05-12 NOTE — ED Provider Notes (Signed)
[facility] Emergency Department Provider Note  ____________________________________________  Time seen: Approximately 3:25 PM  I have reviewed the triage vital signs and the nursing notes.   HISTORY  Chief Complaint Migraine    HPI Bryce [name] is a 52 y.o. male presents emergency department for evaluation of migraine, nasal congestion, nonproductive cough this morning.  Migraine is over his forehead.  He has had photophobia with his migraine.  This feels the same as previous migraines.  Patient states that he used to get frequent migraines but they have been less frequent the last year.  His last migraine was 3 months ago.  They usually come to the emergency department and gets a shot of Toradol, which resolved migraine.  Sometimes he takes Excedrin, which also resolves the migraine.  He did not take any medications today.  He smokes a pack of cigarettes per day.  He is not sure if he has had a fever but has felt warm.    Past Medical History:  Diagnosis Date  . Chronic systolic congestive heart failure (HCC)   . Coronary artery disease   . Hypertension   . Ischemic cardiomyopathy   . MI (myocardial infarction) (HCC)   . Migraine     Patient Active Problem List   Diagnosis Date Noted  . Acute renal failure (ARF) (HCC) 01/13/2018  . CAD (coronary artery disease) 02/20/2017  . Dehydration 02/20/2017  . STEMI (ST elevation myocardial infarction) (HCC) 02/02/2017  . AKI (acute kidney injury) (HCC) 12/15/2015  . Solitary pulmonary nodule 12/23/2014  . Chest pain 12/22/2014  . Hypertension 12/22/2014  . Hyperlipidemia 12/22/2014    Past Surgical History:  Procedure Laterality Date  . CARDIAC CATHETERIZATION N/A 12/28/2014   Procedure: Left Heart Cath and Coronary Angiography;  Surgeon: Alwyn Peawayne D Callwood, MD;  Location: [facility] INVASIVE CV LAB;  Service: Cardiovascular;  Laterality: N/A;  . CORONARY ANGIOPLASTY     Non-ST elevation MI status post  stenting within bare-metal stent of the 75% lesion of the LAD, 11/13/2010.   . CORONARY STENT INTERVENTION N/A 02/02/2017   Procedure: CORONARY/GRAFT ACUTE MI REVASCULARIZATION;  Surgeon: Alwyn Peaallwood, Dwayne D, MD;  Location: [facility] INVASIVE CV LAB;  Service: Cardiovascular;  Laterality: N/A;  . LEFT HEART CATH AND CORONARY ANGIOGRAPHY N/A 02/02/2017   Procedure: LEFT HEART CATH AND CORONARY ANGIOGRAPHY;  Surgeon: Alwyn Peaallwood, Dwayne D, MD;  Location: [facility] INVASIVE CV LAB;  Service: Cardiovascular;  Laterality: N/A;    Prior to Admission medications   Medication Sig Start Date End Date Taking? Authorizing Provider  aspirin EC 81 MG EC tablet Take 1 tablet (81 mg total) by mouth daily. 02/05/17   Katha HammingKonidena, Snehalatha, MD  aspirin-acetaminophen-caffeine [facility](EXCEDRIN MIGRAINE) [phone] MG tablet Take 1 tablet by mouth daily as needed for headache. 05/12/18   Enid DerryWagner, Bernadette Gores, PA-C  clopidogrel (PLAVIX) 75 MG tablet Take 1 tablet (75 mg total) by mouth daily. 02/05/17   Katha HammingKonidena, Snehalatha, MD  Docosanol (ABREVA) 10 % CREA Apply 1 application topically 2 (two) times daily. 01/15/18   Auburn BilberryPatel, Shreyang, MD  lisinopril (PRINIVIL,ZESTRIL) 20 MG tablet Take 1 tablet (20 mg total) by mouth daily. 04/02/18   Iloabachie, Chioma E, NP  meloxicam (MOBIC) 15 MG tablet Take 1 tablet (15 mg total) by mouth daily as needed for pain. 04/02/18 04/02/19  Iloabachie, Chioma E, NP  metoprolol tartrate (LOPRESSOR) 25 MG tablet Take 12.5 mg by mouth 2 (two) times daily.     [provider]  nitroGLYCERIN (NITROSTAT) 0.4 MG SL tablet  Place 1 tablet (0.4 mg total) under the tongue every 5 (five) minutes x 3 doses as needed for chest pain. Patient not taking: Reported on 01/22/2018 02/04/17   Katha Hamming, MD  pantoprazole (PROTONIX) 40 MG tablet Take 1 tablet (40 mg total) by mouth daily. 02/05/17   Katha Hamming, MD  pravastatin (PRAVACHOL) 40 MG tablet Take 1 tablet (40 mg total) by mouth daily. 04/02/18   Iloabachie,  Chioma E, NP  predniSONE (DELTASONE) 10 MG tablet Take 6 tablets on day 1, take 5 tablets on day 2, take 4 tablets on day 3, take 3 tablets on day 4, take 2 tablets on day 5, take 1 tablet on day 6 05/12/18   Enid Derry, PA-C    Allergies Atorvastatin  Family History  Problem Relation Age of Onset  . CAD Father   . Kidney disease Neg Hx   . Diabetes Mellitus II Neg Hx     Social History Social History   Tobacco Use  . Smoking status: Current Every Day Smoker    Packs/day: 1.00    Years: 5.00    Pack years: 5.00    Types: Cigarettes    Last attempt to quit: 01/05/2017    Years since quitting: 1.3  . Smokeless tobacco: Never Used  Substance Use Topics  . Alcohol use: Not Currently  . Drug use: Yes    Types: Marijuana     Review of Systems  Constitutional: No fever/chills ENT: Positive for nasal congestion Cardiovascular: No chest pain. Respiratory: Positive for cough. No SOB. Gastrointestinal: No abdominal pain.  No vomiting.  Musculoskeletal: Negative for musculoskeletal pain. Skin: Negative for rash, abrasions, lacerations, ecchymosis. Neurological: Negative for numbness or tingling. Positive for headache   ____________________________________________   PHYSICAL EXAM:  VITAL SIGNS: ED Triage Vitals  Enc Vitals Group     BP 05/12/18 1407 110/65     Pulse Rate 05/12/18 1407 76     Resp 05/12/18 1407 20     Temp 05/12/18 1407 99 F (37.2 C)     Temp Source 05/12/18 1407 Oral     SpO2 05/12/18 1407 96 %     Weight 05/12/18 1408 240 lb (108.9 kg)     Height 05/12/18 1408 5\' 7"  (1.702 m)     Head Circumference --      Peak Flow --      Pain Score 05/12/18 1415 10     Pain Loc --      Pain Edu? --      Excl. in GC? --      Constitutional: Alert and oriented. Well appearing and in no acute distress. Eyes: Conjunctivae are normal. PERRL. EOMI. Head: Atraumatic. ENT:      Ears:      Nose: No congestion/rhinnorhea.      Mouth/Throat: Mucous membranes  are moist.  Neck: No stridor.  Cardiovascular: Normal rate, regular rhythm.  Good peripheral circulation. Respiratory: Normal respiratory effort without tachypnea or retractions. Lungs CTAB. Good air entry to the bases with no decreased or absent breath sounds. Gastrointestinal: Bowel sounds 4 quadrants. Soft and nontender to palpation. No guarding or rigidity. No palpable masses. No distention. Musculoskeletal: Full range of motion to all extremities. No gross deformities appreciated. Neurologic:  Normal speech and language. No gross focal neurologic deficits are appreciated.  Skin:  Skin is warm, dry and intact. No rash noted. Psychiatric: Mood and affect are normal. Speech and behavior are normal. Patient exhibits appropriate insight and judgement.   ____________________________________________  LABS (all labs ordered are listed, but only abnormal results are displayed)  Labs Reviewed - No data to display ____________________________________________  EKG   ____________________________________________  RADIOLOGY   Dg Chest 2 View  Result Date: 05/12/2018 CLINICAL DATA:  Migraine this morning, cough and sneezing for several days. History of CHF. EXAM: CHEST - 2 VIEW COMPARISON:  Chest radiograph January 13, 2018 FINDINGS: Cardiac silhouette is upper limits of normal size. Mediastinal silhouette is unremarkable. Chronic bronchitic changes without pleural effusion or focal consolidation. Prominent pleural fat without pleural effusion. No pneumothorax. Soft tissue planes and included osseous structures are non suspicious. Mild thoracic spondylosis. IMPRESSION: 1. Stable borderline cardiomegaly and chronic bronchitic changes. Electronically Signed   By: Awilda Metro M.D.   On: 05/12/2018 16:08    ____________________________________________    PROCEDURES  Procedure(s) performed:    Procedures    Medications  SUMAtriptan (IMITREX) injection 6 mg (6 mg Subcutaneous  Given 05/12/18 1616)  acetaminophen (TYLENOL) tablet 1,000 mg (1,000 mg Oral Given 05/12/18 1617)  ketorolac (TORADOL) 30 MG/ML injection 30 mg (30 mg Intramuscular Given 05/12/18 1658)  ondansetron (ZOFRAN-ODT) disintegrating tablet 8 mg (8 mg Oral Not Given 05/12/18 1734)  predniSONE (DELTASONE) tablet 60 mg (60 mg Oral Given 05/12/18 1812)     ____________________________________________   INITIAL IMPRESSION / ASSESSMENT AND PLAN / ED COURSE  Pertinent labs & imaging results that were available during my care of the patient were reviewed by me and considered in my medical decision making (see chart for details).  Review of the [place] CSRS was performed in accordance of the [facility] prior to dispensing any controlled drugs.   Patient's diagnosis is consistent with migraine and viral URI.  Vital signs and exam are reassuring.  Patient was given Imitrex, Zofran, Toradol, Tylenol and migraine resolved.  Checks x-ray consistent with chronic changes.  These findings were discussed with patient.  Patient will be discharged home with prescriptions for prednisone for URI symptoms and Excedrin Migraine for headache. Patient is to follow up with primary care as directed. Patient is given ED precautions to return to the ED for any worsening or new symptoms.     ____________________________________________  FINAL CLINICAL IMPRESSION(S) / ED DIAGNOSES  Final diagnoses:  Intractable migraine without aura and without status migrainosus  Viral URI with cough      NEW MEDICATIONS STARTED DURING THIS VISIT:  ED Discharge Orders         Ordered    predniSONE (DELTASONE) 10 MG tablet     05/12/18 1805    aspirin-acetaminophen-caffeine (EXCEDRIN MIGRAINE) 250-250-65 MG tablet  Daily PRN     05/12/18 1805              This chart was dictated using voice recognition software/Dragon. Despite best efforts to proofread, errors can occur which can change the meaning. Any change was purely  unintentional.    Enid Derry, PA-C 05/12/18 2244    Sharman Cheek, MD 05/12/18 [phone]

## 2018-06-25 ENCOUNTER — Other Ambulatory Visit: Payer: Medicaid Other

## 2018-06-30 ENCOUNTER — Other Ambulatory Visit: Payer: Medicaid Other

## 2018-07-01 ENCOUNTER — Other Ambulatory Visit: Payer: Medicaid Other

## 2018-07-01 DIAGNOSIS — I10 Essential (primary) hypertension: Secondary | ICD-10-CM

## 2018-07-01 DIAGNOSIS — E785 Hyperlipidemia, unspecified: Secondary | ICD-10-CM

## 2018-07-02 ENCOUNTER — Encounter: Payer: Self-pay | Admitting: Gerontology

## 2018-07-02 ENCOUNTER — Ambulatory Visit: Payer: Medicaid Other | Admitting: Gerontology

## 2018-07-02 ENCOUNTER — Other Ambulatory Visit: Payer: Self-pay

## 2018-07-02 VITALS — BP 120/79 | HR 69

## 2018-07-02 DIAGNOSIS — E785 Hyperlipidemia, unspecified: Secondary | ICD-10-CM

## 2018-07-02 DIAGNOSIS — Z0000 Encounter for general adult medical examination without abnormal findings: Secondary | ICD-10-CM

## 2018-07-02 DIAGNOSIS — I213 ST elevation (STEMI) myocardial infarction of unspecified site: Secondary | ICD-10-CM

## 2018-07-02 DIAGNOSIS — I10 Essential (primary) hypertension: Secondary | ICD-10-CM

## 2018-07-02 DIAGNOSIS — Z8719 Personal history of other diseases of the digestive system: Secondary | ICD-10-CM

## 2018-07-02 LAB — COMPREHENSIVE METABOLIC PANEL
ALT: 25 IU/L (ref 0–44)
AST: 12 IU/L (ref 0–40)
Albumin/Globulin Ratio: 1.8 (ref 1.2–2.2)
Albumin: 4.3 g/dL (ref 3.8–4.9)
Alkaline Phosphatase: 56 IU/L (ref 39–117)
BUN/Creatinine Ratio: 11 (ref 9–20)
BUN: 11 mg/dL (ref 6–24)
Bilirubin Total: 0.3 mg/dL (ref 0.0–1.2)
CHLORIDE: 106 mmol/L (ref 96–106)
CO2: 24 mmol/L (ref 20–29)
CREATININE: 1.03 mg/dL (ref 0.76–1.27)
Calcium: 9.6 mg/dL (ref 8.7–10.2)
GFR calc Af Amer: 95 mL/min/{1.73_m2} (ref >59)
GFR calc non Af Amer: 83 mL/min/{1.73_m2} (ref >59)
GLOBULIN, TOTAL: 2.4 g/dL (ref 1.5–4.5)
Glucose: 106 mg/dL — ABNORMAL HIGH (ref 65–99)
POTASSIUM: 5 mmol/L (ref 3.5–5.2)
SODIUM: 144 mmol/L (ref 134–144)
Total Protein: 6.7 g/dL (ref 6.0–8.5)

## 2018-07-02 LAB — URINALYSIS
Bilirubin, UA: NEGATIVE
GLUCOSE, UA: NEGATIVE
Ketones, UA: NEGATIVE
Leukocytes, UA: NEGATIVE
NITRITE UA: NEGATIVE
PH UA: 5 (ref 5.0–7.5)
Protein, UA: NEGATIVE
RBC, UA: NEGATIVE
Specific Gravity, UA: 1.029 (ref 1.005–1.030)
Urobilinogen, Ur: 0.2 mg/dL (ref 0.2–1.0)

## 2018-07-02 LAB — LIPID PANEL
Chol/HDL Ratio: 4 ratio (ref 0.0–5.0)
Cholesterol, Total: 178 mg/dL (ref 100–199)
HDL: 44 mg/dL (ref >39)
LDL Calculated: 82 mg/dL (ref 0–99)
TRIGLYCERIDES: 261 mg/dL — AB (ref 0–149)
VLDL Cholesterol Cal: 52 mg/dL — ABNORMAL HIGH (ref 5–40)

## 2018-07-02 LAB — MICROALBUMIN / CREATININE URINE RATIO
CREATININE, UR: 354.7 mg/dL
MICROALBUM., U, RANDOM: 13.4 ug/mL
Microalb/Creat Ratio: 4 mg/g creat (ref 0–29)

## 2018-07-02 MED ORDER — METOPROLOL TARTRATE 25 MG PO TABS: 12.5000 mg | ORAL_TABLET | Freq: Two times a day (BID) | ORAL | 3 refills | Status: DC

## 2018-07-02 MED ORDER — PANTOPRAZOLE SODIUM 40 MG PO TBEC: 40.0000 mg | DELAYED_RELEASE_TABLET | Freq: Every day | ORAL | 0 refills | Status: DC

## 2018-07-02 MED ORDER — CLOPIDOGREL BISULFATE 75 MG PO TABS: 75.0000 mg | ORAL_TABLET | Freq: Every day | ORAL | 0 refills | Status: DC

## 2018-07-02 NOTE — Progress Notes (Signed)
 Established Patient Office Visit  Subjective:  Patient ID: Bryce [name], male    DOB: 07/06/1965  Age: 52 y.o. MRN: [id number]  CC:  Chief Complaint  Patient presents with  . Follow-up    HPI Bryce [name] presents for follow up hypertension and history of Myocardial Infarction. He was treated at the ED 05/12/18 for migraine.  He states that he had a biopsy on a spot/ floater to his right ear and Nevus to  his left lower neck by Dr Miedema J on 06/24/18, and he awaits the result. He denies headache, chest pain, palpitation, shortness of breath, fever and chills.  He  reports that he continues to take 12.5 mg metoprolol bid and 20 mg lisinopril daily, and he admits to checking his blood pressure at home. He  Continues to smoke 10 cigarettes a day and admits the desire the quit. He admits to taking 40 mg Pravastatin daily and denies myalgia. He admits that his acid reflux is well controlled with 40 mg Protonix daily. Also he states that his  7/10 intermittent dull lower back pain is being relieved with taking Meloxicam 15 mg daily as needed. Otherwise he reports that he's doing well and has no further concerns.     Past Medical History:  Diagnosis Date  . Chronic systolic congestive heart failure (HCC)   . Coronary artery disease   . Hypertension   . Ischemic cardiomyopathy   . MI (myocardial infarction) (HCC)   . Migraine     Past Surgical History:  Procedure Laterality Date  . CARDIAC CATHETERIZATION N/A 12/28/2014   Procedure: Left Heart Cath and Coronary Angiography;  Surgeon: Dwayne D Callwood, MD;  Location: [facility] INVASIVE CV LAB;  Service: Cardiovascular;  Laterality: N/A;  . CORONARY ANGIOPLASTY     Non-ST elevation MI status post stenting within bare-metal stent of the 75% lesion of the LAD, 11/13/2010.   . CORONARY STENT INTERVENTION N/A 02/02/2017   Procedure: CORONARY/GRAFT ACUTE MI REVASCULARIZATION;  Surgeon: Callwood, Dwayne D, MD;  Location: [facility] INVASIVE CV  LAB;  Service: Cardiovascular;  Laterality: N/A;  . LEFT HEART CATH AND CORONARY ANGIOGRAPHY N/A 02/02/2017   Procedure: LEFT HEART CATH AND CORONARY ANGIOGRAPHY;  Surgeon: Callwood, Dwayne D, MD;  Location: [facility] INVASIVE CV LAB;  Service: Cardiovascular;  Laterality: N/A;    Family History  Problem Relation Age of Onset  . CAD Father   . Kidney disease Neg Hx   . Diabetes Mellitus II Neg Hx     Social History   Socioeconomic History  . Marital status: Single    Spouse name: Not on file  . Number of children: Not on file  . Years of education: Not on file  . Highest education level: Not on file  Occupational History  . Occupation: Brick and masonry  Social Needs  . Financial resource strain: Not on file  . Food insecurity:    Worry: Not on file    Inability: Not on file  . Transportation needs:    Medical: Not on file    Non-medical: Not on file  Tobacco Use  . Smoking status: Current Every Day Smoker    Packs/day: 1.00    Years: 5.00    Pack years: 5.00    Types: Cigarettes    Last attempt to quit: 01/05/2017    Years since quitting: 1.4  . Smokeless tobacco: Never Used  Substance and Sexual Activity  . Alcohol use: Not Currently  . Drug use: Yes      Types: Marijuana  . Sexual activity: Yes  Lifestyle  . Physical activity:    Days per week: Not on file    Minutes per session: Not on file  . Stress: Not on file  Relationships  . Social connections:    Talks on phone: Not on file    Gets together: Not on file    Attends religious service: Not on file    Active member of club or organization: Not on file    Attends meetings of clubs or organizations: Not on file    Relationship status: Not on file  . Intimate partner violence:    Fear of current or ex partner: Not on file    Emotionally abused: Not on file    Physically abused: Not on file    Forced sexual activity: Not on file  Other Topics Concern  . Not on file  Social History Narrative  . Not on file     Outpatient Medications Prior to Visit  Medication Sig Dispense Refill  . aspirin EC 81 MG EC tablet Take 1 tablet (81 mg total) by mouth daily. 30 tablet 0  . aspirin-acetaminophen-caffeine (EXCEDRIN MIGRAINE) 250-250-65 MG tablet Take 1 tablet by mouth daily as needed for headache. 30 tablet 0  . clopidogrel (PLAVIX) 75 MG tablet Take 1 tablet (75 mg total) by mouth daily. 90 tablet 0  . lisinopril (PRINIVIL,ZESTRIL) 20 MG tablet Take 1 tablet (20 mg total) by mouth daily. 30 tablet 2  . meloxicam (MOBIC) 15 MG tablet Take 1 tablet (15 mg total) by mouth daily as needed for pain. 30 tablet 3  . metoprolol tartrate (LOPRESSOR) 25 MG tablet Take 12.5 mg by mouth 2 (two) times daily.     . nitroGLYCERIN (NITROSTAT) 0.4 MG SL tablet Place 1 tablet (0.4 mg total) under the tongue every 5 (five) minutes x 3 doses as needed for chest pain. 30 tablet 0  . pantoprazole (PROTONIX) 40 MG tablet Take 1 tablet (40 mg total) by mouth daily. 30 tablet 0  . pravastatin (PRAVACHOL) 40 MG tablet Take 1 tablet (40 mg total) by mouth daily. 90 tablet 3  . predniSONE (DELTASONE) 10 MG tablet Take 6 tablets on day 1, take 5 tablets on day 2, take 4 tablets on day 3, take 3 tablets on day 4, take 2 tablets on day 5, take 1 tablet on day 6 21 tablet 0  . Docosanol (ABREVA) 10 % CREA Apply 1 application topically 2 (two) times daily. (Patient not taking: Reported on 07/02/2018) 1 Tube 0   No facility-administered medications prior to visit.     Allergies  Allergen Reactions  . Atorvastatin     Myalgias    ROS Review of Systems  Constitutional: Negative.   HENT: Negative.   Eyes: Negative.   Respiratory: Negative.   Cardiovascular: Negative.   Gastrointestinal: Negative.   Endocrine: Negative.   Genitourinary: Negative.   Musculoskeletal: Negative.   Skin: Negative.   Neurological: Negative.   Psychiatric/Behavioral: Negative.       Objective:    Physical Exam  Constitutional: He is oriented  to person, place, and time. He appears well-developed and well-nourished.  HENT:  Head: Normocephalic and atraumatic.  Ears:  Eyes: Pupils are equal, round, and reactive to light. EOM are normal.  Neck: Normal range of motion.    Cardiovascular: Normal rate and regular rhythm.  Pulmonary/Chest: Effort normal and breath sounds normal.  Abdominal: Soft. Bowel sounds are normal.  Musculoskeletal:       Right shoulder: He exhibits tenderness.       Back:  Neurological: He is alert and oriented to person, place, and time.  Skin: Skin is warm and dry.  Psychiatric: He has a normal mood and affect. His behavior is normal. Judgment and thought content normal.    BP 120/79 (BP Location: Left Arm, Patient Position: Sitting)   Pulse 69   SpO2 96%  Wt Readings from Last 3 Encounters:  05/12/18 240 lb (108.9 kg)  04/02/18 242 lb 3.2 oz (109.9 kg)  01/22/18 236 lb 14.4 oz (107.5 kg)   He reports that he's working on losing weight.  Health Maintenance Due  Topic Date Due  . TETANUS/TDAP  10/11/1983  . COLONOSCOPY  10/11/2014  . INFLUENZA VACCINE  01/01/2018   He states that he has not done colonoscopy, stool card was provided for screening and he declined influenza vaccine. There are no preventive care reminders to display for this patient.  Lab Results  Component Value Date   TSH 1.624 12/22/2014   Lab Results  Component Value Date   WBC 5.4 01/22/2018   HGB 14.1 01/22/2018   HCT 41.5 01/22/2018   MCV 96 01/22/2018   PLT 217 01/22/2018   Lab Results  Component Value Date   NA 144 07/01/2018   K 5.0 07/01/2018   CO2 24 07/01/2018   GLUCOSE 106 (H) 07/01/2018   BUN 11 07/01/2018   CREATININE 1.03 07/01/2018   BILITOT 0.3 07/01/2018   ALKPHOS 56 07/01/2018   AST 12 07/01/2018   ALT 25 07/01/2018   PROT 6.7 07/01/2018   ALBUMIN 4.3 07/01/2018   CALCIUM 9.6 07/01/2018   ANIONGAP 4 (L) 01/15/2018   Lab Results  Component Value Date   CHOL 178 07/01/2018   Lab  Results  Component Value Date   HDL 44 07/01/2018   Lab Results  Component Value Date   LDLCALC 82 07/01/2018   Lab Results  Component Value Date   TRIG 261 (H) 07/01/2018   Lab Results  Component Value Date   CHOLHDL 4.0 07/01/2018   Lab Results  Component Value Date   HGBA1C 5.4 01/22/2018      Assessment & Plan:   Problem List Items Addressed This Visit    None    1. Essential hypertension - Blood pressure is well controlled, goal is < 140/90. -He will continue his current treatment regimen and was encouraged to continue on low salt diet, exercise 30 minutes daily and continue to lose weight. - metoprolol tartrate (LOPRESSOR) 25 MG tablet; Take 0.5 tablets (12.5 mg total) by mouth 2 (two) times daily.  Dispense: 30 tablet; Refill: 3  2. Hyperlipidemia, unspecified hyperlipidemia type - He will continue on current treatment regimen and continue on low fat low cholesterol diet. Will recheck lipid panel in 4 months.  3. Health care maintenance - Labs will be rechecked in 4 months - CBC w/Diff; Future - Comp Met (CMET); Future - Lipid Profile; Future - HgB A1c; Future - Urinalysis; Future  4. ST elevation myocardial infarction (STEMI), unspecified artery (HCC) - Continue on current regimen - clopidogrel (PLAVIX) 75 MG tablet; Take 1 tablet (75 mg total) by mouth daily.  Dispense: 90 tablet; Refill: 0  5. H/O gastroesophageal reflux (GERD) - Continue on current regimen - pantoprazole (PROTONIX) 40 MG tablet; Take 1 tablet (40 mg total) by mouth daily.  Dispense: 30 tablet; Refill: 0   No orders of the defined types were placed in this encounter.     Follow-up: No follow-ups on file. Follow up in 4 months, labs 1 week prior.  Chioma E Iloabachie, NP 

## 2018-07-02 NOTE — Patient Instructions (Signed)
DASH Eating Plan  DASH stands for "Dietary Approaches to Stop Hypertension." The DASH eating plan is a healthy eating plan that has been shown to reduce high blood pressure (hypertension). It may also reduce your risk for type 2 diabetes, heart disease, and stroke. The DASH eating plan may also help with weight loss.  What are tips for following this plan?    General guidelines   Avoid eating more than 2,300 mg (milligrams) of salt (sodium) a day. If you have hypertension, you may need to reduce your sodium intake to 1,500 mg a day.   Limit alcohol intake to no more than 1 drink a day for nonpregnant women and 2 drinks a day for men. One drink equals 12 oz of beer, 5 oz of wine, or 1 oz of hard liquor.   Work with your health care provider to maintain a healthy body weight or to lose weight. Ask what an ideal weight is for you.   Get at least 30 minutes of exercise that causes your heart to beat faster (aerobic exercise) most days of the week. Activities may include walking, swimming, or biking.   Work with your health care provider or diet and nutrition specialist (dietitian) to adjust your eating plan to your individual calorie needs.  Reading food labels     Check food labels for the amount of sodium per serving. Choose foods with less than 5 percent of the Daily Value of sodium. Generally, foods with less than 300 mg of sodium per serving fit into this eating plan.   To find whole grains, look for the word "whole" as the first word in the ingredient list.  Shopping   Buy products labeled as "low-sodium" or "no salt added."   Buy fresh foods. Avoid canned foods and premade or frozen meals.  Cooking   Avoid adding salt when cooking. Use salt-free seasonings or herbs instead of table salt or sea salt. Check with your health care provider or pharmacist before using salt substitutes.   Do not fry foods. Cook foods using healthy methods such as baking, boiling, grilling, and broiling instead.   Cook with  heart-healthy oils, such as olive, canola, soybean, or sunflower oil.  Meal planning   Eat a balanced diet that includes:  ? 5 or more servings of fruits and vegetables each day. At each meal, try to fill half of your plate with fruits and vegetables.  ? Up to 6-8 servings of whole grains each day.  ? Less than 6 oz of lean meat, poultry, or fish each day. A 3-oz serving of meat is about the same size as a deck of cards. One egg equals 1 oz.  ? 2 servings of low-fat dairy each day.  ? A serving of nuts, seeds, or beans 5 times each week.  ? Heart-healthy fats. Healthy fats called Omega-3 fatty acids are found in foods such as flaxseeds and coldwater fish, like sardines, salmon, and mackerel.   Limit how much you eat of the following:  ? Canned or prepackaged foods.  ? Food that is high in trans fat, such as fried foods.  ? Food that is high in saturated fat, such as fatty meat.  ? Sweets, desserts, sugary drinks, and other foods with added sugar.  ? Full-fat dairy products.   Do not salt foods before eating.   Try to eat at least 2 vegetarian meals each week.   Eat more home-cooked food and less restaurant, buffet, and fast food.     When eating at a restaurant, ask that your food be prepared with less salt or no salt, if possible.  What foods are recommended?  The items listed may not be a complete list. Talk with your dietitian about what dietary choices are best for you.  Grains  Whole-grain or whole-wheat bread. Whole-grain or whole-wheat pasta. Brown rice. Oatmeal. Quinoa. Bulgur. Whole-grain and low-sodium cereals. Pita bread. Low-fat, low-sodium crackers. Whole-wheat flour tortillas.  Vegetables  Fresh or frozen vegetables (raw, steamed, roasted, or grilled). Low-sodium or reduced-sodium tomato and vegetable juice. Low-sodium or reduced-sodium tomato sauce and tomato paste. Low-sodium or reduced-sodium canned vegetables.  Fruits  All fresh, dried, or frozen fruit. Canned fruit in natural juice (without  added sugar).  Meat and other protein foods  Skinless chicken or turkey. Ground chicken or turkey. Pork with fat trimmed off. Fish and seafood. Egg whites. Dried beans, peas, or lentils. Unsalted nuts, nut butters, and seeds. Unsalted canned beans. Lean cuts of beef with fat trimmed off. Low-sodium, lean deli meat.  Dairy  Low-fat (1%) or fat-free (skim) milk. Fat-free, low-fat, or reduced-fat cheeses. Nonfat, low-sodium ricotta or cottage cheese. Low-fat or nonfat yogurt. Low-fat, low-sodium cheese.  Fats and oils  Soft margarine without trans fats. Vegetable oil. Low-fat, reduced-fat, or light mayonnaise and salad dressings (reduced-sodium). Canola, safflower, olive, soybean, and sunflower oils. Avocado.  Seasoning and other foods  Herbs. Spices. Seasoning mixes without salt. Unsalted popcorn and pretzels. Fat-free sweets.  What foods are not recommended?  The items listed may not be a complete list. Talk with your dietitian about what dietary choices are best for you.  Grains  Baked goods made with fat, such as croissants, muffins, or some breads. Dry pasta or rice meal packs.  Vegetables  Creamed or fried vegetables. Vegetables in a cheese sauce. Regular canned vegetables (not low-sodium or reduced-sodium). Regular canned tomato sauce and paste (not low-sodium or reduced-sodium). Regular tomato and vegetable juice (not low-sodium or reduced-sodium). Pickles. Olives.  Fruits  Canned fruit in a light or heavy syrup. Fried fruit. Fruit in cream or butter sauce.  Meat and other protein foods  Fatty cuts of meat. Ribs. Fried meat. Bacon. Sausage. Bologna and other processed lunch meats. Salami. Fatback. Hotdogs. Bratwurst. Salted nuts and seeds. Canned beans with added salt. Canned or smoked fish. Whole eggs or egg yolks. Chicken or turkey with skin.  Dairy  Whole or 2% milk, cream, and half-and-half. Whole or full-fat cream cheese. Whole-fat or sweetened yogurt. Full-fat cheese. Nondairy creamers. Whipped toppings.  Processed cheese and cheese spreads.  Fats and oils  Butter. Stick margarine. Lard. Shortening. Ghee. Bacon fat. Tropical oils, such as coconut, palm kernel, or palm oil.  Seasoning and other foods  Salted popcorn and pretzels. Onion salt, garlic salt, seasoned salt, table salt, and sea salt. Worcestershire sauce. Tartar sauce. Barbecue sauce. Teriyaki sauce. Soy sauce, including reduced-sodium. Steak sauce. Canned and packaged gravies. Fish sauce. Oyster sauce. Cocktail sauce. Horseradish that you find on the shelf. Ketchup. Mustard. Meat flavorings and tenderizers. Bouillon cubes. Hot sauce and Tabasco sauce. Premade or packaged marinades. Premade or packaged taco seasonings. Relishes. Regular salad dressings.  Where to find more information:   National Heart, Lung, and Blood Institute: www.nhlbi.nih.gov   American Heart Association: www.heart.org  Summary   The DASH eating plan is a healthy eating plan that has been shown to reduce high blood pressure (hypertension). It may also reduce your risk for type 2 diabetes, heart disease, and stroke.   With the   DASH eating plan, you should limit salt (sodium) intake to 2,300 mg a day. If you have hypertension, you may need to reduce your sodium intake to 1,500 mg a day.   When on the DASH eating plan, aim to eat more fresh fruits and vegetables, whole grains, lean proteins, low-fat dairy, and heart-healthy fats.   Work with your health care provider or diet and nutrition specialist (dietitian) to adjust your eating plan to your individual calorie needs.  This information is not intended to replace advice given to you by your health care provider. Make sure you discuss any questions you have with your health care provider.  Document Released: 05/09/2011 Document Revised: 05/13/2016 Document Reviewed: 05/13/2016  Elsevier Interactive Patient Education  2019 Elsevier Inc.

## 2018-07-14 ENCOUNTER — Encounter: Payer: Self-pay | Admitting: Gerontology

## 2018-07-14 ENCOUNTER — Other Ambulatory Visit: Payer: Self-pay

## 2018-07-14 ENCOUNTER — Ambulatory Visit: Payer: Medicaid Other | Admitting: Gerontology

## 2018-07-14 VITALS — BP 128/84 | HR 69 | Wt 249.0 lb

## 2018-07-14 DIAGNOSIS — J3489 Other specified disorders of nose and nasal sinuses: Secondary | ICD-10-CM

## 2018-07-14 NOTE — Progress Notes (Signed)
Acute Office Visit  Subjective:    Patient ID: Bryce [name], male    DOB: 01-Apr-1966, 53 y.o.   MRN: [id number]  Chief Complaint  Patient presents with  . Follow-up    bump on inside of right side of nose    HPI Patient is in today for a bump inside the right nostril. He reports that he pulled a strand of hair out of the right nostril nine days ago and it has been painful since then. He denise epistaxis, and c/o having rhinorrhea and dried crust to his right nostril for the past 3 days. He denies fever, chills, sinus congestion, chest pain, and palpitation.  Past Medical History:  Diagnosis Date  . Chronic systolic congestive heart failure (HCC)   . Coronary artery disease   . Hypertension   . Ischemic cardiomyopathy   . MI (myocardial infarction) (HCC)   . Migraine     Past Surgical History:  Procedure Laterality Date  . CARDIAC CATHETERIZATION N/A 12/28/2014   Procedure: Left Heart Cath and Coronary Angiography;  Surgeon: Alwyn Peawayne D Callwood, MD;  Location: [facility];  Service: Cardiovascular;  Laterality: N/A;  . CORONARY ANGIOPLASTY     Non-ST elevation MI status post stenting within bare-metal stent of the 75% lesion of the LAD, 11/13/2010.   . CORONARY STENT INTERVENTION N/A 02/02/2017   Procedure: CORONARY/GRAFT ACUTE MI REVASCULARIZATION;  Surgeon: Alwyn Peaallwood, Dwayne D, MD;  Location: [facility];  Service: Cardiovascular;  Laterality: N/A;  . LEFT HEART CATH AND CORONARY ANGIOGRAPHY N/A 02/02/2017   Procedure: LEFT HEART CATH AND CORONARY ANGIOGRAPHY;  Surgeon: Alwyn Peaallwood, Dwayne D, MD;  Location: [facility];  Service: Cardiovascular;  Laterality: N/A;    Family History  Problem Relation Age of Onset  . CAD Father   . Kidney disease Neg Hx   . Diabetes Mellitus II Neg Hx     Social History   Socioeconomic History  . Marital status: Single    Spouse name: Not on file  . Number of children: Not on file  . Years of education: Not on file   . Highest education level: Not on file  Occupational History  . 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 Financial resource strain: Not on file  . Food insecurity:    Worry: Not on file    Inability: Not on file  . Transportation needs:    Medical: Not on file    Non-medical: Not on file  Tobacco Use  . Smoking status: Current Every Day Smoker    Packs/day: 1.00    Years: 5.00    Pack years: 5.00    Types: Cigarettes    Last attempt to quit: 01/05/2017    Years since quitting: 1.5  . Smokeless tobacco: Never Used  Substance and Sexual Activity  . Alcohol use: Not Currently  . Drug use: Yes    Types: Marijuana  . Sexual activity: Yes  Lifestyle  . Physical activity:    Days per week: Not on file    Minutes per session: Not on file  . Stress: Not on file  Relationships  . Social connections:    Talks on phone: Not on file    Gets together: Not on file    Attends religious service: Not on file    Active member of club or organization: Not on file    Attends meetings of clubs or organizations: Not on file    Relationship status: Not  on file  . Intimate partner violence:    Fear of current or ex partner: Not on file    Emotionally abused: Not on file    Physically abused: Not on file    Forced sexual activity: Not on file  Other Topics Concern  . Not on file  Social History Narrative  . Not on file    Outpatient Medications Prior to Visit  Medication Sig Dispense Refill  . aspirin EC 81 MG EC tablet Take 1 tablet (81 mg total) by mouth daily. 30 tablet 0  . aspirin-acetaminophen-caffeine (EXCEDRIN MIGRAINE) 250-250-65 MG tablet Take 1 tablet by mouth daily as needed for headache. 30 tablet 0  . clopidogrel (PLAVIX) 75 MG tablet Take 1 tablet (75 mg total) by mouth daily. 90 tablet 0  . Docosanol (ABREVA) 10 % CREA Apply 1 application topically 2 (two) times daily. 1 Tube 0  . lisinopril (PRINIVIL,ZESTRIL) 20 MG tablet Take 1 tablet (20 mg total) by mouth daily.  30 tablet 2  . meloxicam (MOBIC) 15 MG tablet Take 1 tablet (15 mg total) by mouth daily as needed for pain. 30 tablet 3  . metoprolol tartrate (LOPRESSOR) 25 MG tablet Take 0.5 tablets (12.5 mg total) by mouth 2 (two) times daily. 30 tablet 3  . nitroGLYCERIN (NITROSTAT) 0.4 MG SL tablet Place 1 tablet (0.4 mg total) under the tongue every 5 (five) minutes x 3 doses as needed for chest pain. 30 tablet 0  . pantoprazole (PROTONIX) 40 MG tablet Take 1 tablet (40 mg total) by mouth daily. 30 tablet 0  . pravastatin (PRAVACHOL) 40 MG tablet Take 1 tablet (40 mg total) by mouth daily. 90 tablet 3   No facility-administered medications prior to visit.     Allergies  Allergen Reactions  . Atorvastatin     Myalgias    Review of Systems  Constitutional: Negative for chills and fever.  HENT: Negative for congestion, nosebleeds and sinus pain.   Respiratory: Negative.   Cardiovascular: Negative.   Skin: Negative.        Objective:    Physical Exam  Constitutional: He is oriented to person, place, and time. He appears well-developed and well-nourished.  HENT:  Head: Normocephalic.  Nose: No mucosal edema, rhinorrhea, nose lacerations, sinus tenderness, nasal deformity, septal deviation or nasal septal hematoma. No epistaxis.  No foreign bodies. Right sinus exhibits no maxillary sinus tenderness and no frontal sinus tenderness. Left sinus exhibits no maxillary sinus tenderness and no frontal sinus tenderness.    Eyes: Pupils are equal, round, and reactive to light. EOM are normal.  Neck: Normal range of motion. Neck supple.  Cardiovascular: Normal rate and regular rhythm.  Pulmonary/Chest: Effort normal and breath sounds normal.  Neurological: He is alert and oriented to person, place, and time.  Skin: Skin is warm.  Psychiatric: He has a normal mood and affect. His behavior is normal. Judgment and thought content normal.    BP 128/84 (BP Location: Left Arm, Patient Position: Sitting)    Pulse 69   Wt 249 lb (112.9 kg)   SpO2 96%   BMI 39.00 kg/m  Wt Readings from Last 3 Encounters:  07/14/18 249 lb (112.9 kg)  05/12/18 240 lb (108.9 kg)  04/02/18 242 lb 3.2 oz (109.9 kg)  He gained 9 pounds since  05/12/18, he was encouraged to decrease caloric intake, exercise 30 minutes daily and plan of losing weight.  Health Maintenance Due  Topic Date Due  . TETANUS/TDAP  10/11/1983  .  COLONOSCOPY  10/11/2014  . INFLUENZA VACCINE  01/01/2018  He was provided with stool card for occult blood screening/.  There are no preventive care reminders to display for this patient.   Lab Results  Component Value Date   TSH 1.624 12/22/2014   Lab Results  Component Value Date   WBC 5.4 01/22/2018   HGB 14.1 01/22/2018   HCT 41.5 01/22/2018   MCV 96 01/22/2018   PLT 217 01/22/2018   Lab Results  Component Value Date   NA 144 07/01/2018   K 5.0 07/01/2018   CO2 24 07/01/2018   GLUCOSE 106 (H) 07/01/2018   BUN 11 07/01/2018   CREATININE 1.03 07/01/2018   BILITOT 0.3 07/01/2018   ALKPHOS 56 07/01/2018   AST 12 07/01/2018   ALT 25 07/01/2018   PROT 6.7 07/01/2018   ALBUMIN 4.3 07/01/2018   CALCIUM 9.6 07/01/2018   ANIONGAP 4 (L) 01/15/2018   Lab Results  Component Value Date   CHOL 178 07/01/2018   Lab Results  Component Value Date   HDL 44 07/01/2018   Lab Results  Component Value Date   LDLCALC 82 07/01/2018   Lab Results  Component Value Date   TRIG 261 (H) 07/01/2018   Lab Results  Component Value Date   CHOLHDL 4.0 07/01/2018   Lab Results  Component Value Date   HGBA1C 5.4 01/22/2018       Assessment & Plan:   Problem List Items Addressed This Visit    None    Visit Diagnoses    Sore in nostril    -  Primary      1. Sore in nostril - No laceration/lesion observed to right nostril, he was encouraged to stop picking his nostril and apply small amount of bacitracin ointment to right nostril. He was advised to notify provider of worsening  symptoms.  No orders of the defined types were placed in this encounter.  Follow up in in May for already scheduled appointment or if symptom worsens  Modesty Rudy Trellis Paganini, NP

## 2018-07-14 NOTE — Patient Instructions (Signed)
Apply otc neosporin to right nostril - Reducing picking nostril

## 2018-08-07 ENCOUNTER — Emergency Department: Payer: Self-pay

## 2018-08-07 ENCOUNTER — Encounter: Payer: Self-pay | Admitting: Emergency Medicine

## 2018-08-07 ENCOUNTER — Emergency Department
Admission: EM | Admit: 2018-08-07 | Discharge: 2018-08-07 | Disposition: A | Discharge status: Home / Self Care | Payer: Self-pay | Attending: Emergency Medicine | Admitting: Emergency Medicine

## 2018-08-07 ENCOUNTER — Other Ambulatory Visit: Payer: Self-pay

## 2018-08-07 DIAGNOSIS — M791 Myalgia, unspecified site: Secondary | ICD-10-CM | POA: Insufficient documentation

## 2018-08-07 DIAGNOSIS — R51 Headache: Secondary | ICD-10-CM | POA: Insufficient documentation

## 2018-08-07 DIAGNOSIS — I2510 Atherosclerotic heart disease of native coronary artery without angina pectoris: Secondary | ICD-10-CM | POA: Insufficient documentation

## 2018-08-07 DIAGNOSIS — I5022 Chronic systolic (congestive) heart failure: Secondary | ICD-10-CM | POA: Insufficient documentation

## 2018-08-07 DIAGNOSIS — I110 Hypertensive heart disease with heart failure: Secondary | ICD-10-CM | POA: Insufficient documentation

## 2018-08-07 DIAGNOSIS — I252 Old myocardial infarction: Secondary | ICD-10-CM | POA: Insufficient documentation

## 2018-08-07 DIAGNOSIS — R519 Headache, unspecified: Secondary | ICD-10-CM

## 2018-08-07 DIAGNOSIS — R6883 Chills (without fever): Secondary | ICD-10-CM | POA: Insufficient documentation

## 2018-08-07 DIAGNOSIS — F17210 Nicotine dependence, cigarettes, uncomplicated: Secondary | ICD-10-CM | POA: Insufficient documentation

## 2018-08-07 DIAGNOSIS — R509 Fever, unspecified: Secondary | ICD-10-CM

## 2018-08-07 DIAGNOSIS — Z955 Presence of coronary angioplasty implant and graft: Secondary | ICD-10-CM | POA: Insufficient documentation

## 2018-08-07 DIAGNOSIS — R000 Tachycardia, unspecified: Secondary | ICD-10-CM | POA: Insufficient documentation

## 2018-08-07 DIAGNOSIS — F1210 Cannabis abuse, uncomplicated: Secondary | ICD-10-CM | POA: Insufficient documentation

## 2018-08-07 LAB — CBC WITH DIFFERENTIAL/PLATELET
Abs Immature Granulocytes: 0.02 10*3/uL (ref 0.00–0.07)
Basophils Absolute: 0 10*3/uL (ref 0.0–0.1)
Basophils Relative: 0 %
Eosinophils Absolute: 0.1 10*3/uL (ref 0.0–0.5)
Eosinophils Relative: 2 %
HCT: 47 % (ref 39.0–52.0)
Hemoglobin: 16.3 g/dL (ref 13.0–17.0)
Immature Granulocytes: 0 %
Lymphocytes Relative: 10 %
Lymphs Abs: 0.6 10*3/uL — ABNORMAL LOW (ref 0.7–4.0)
MCH: 31.8 pg (ref 26.0–34.0)
MCHC: 34.7 g/dL (ref 30.0–36.0)
MCV: 91.8 fL (ref 80.0–100.0)
MONOS PCT: 8 %
Monocytes Absolute: 0.5 10*3/uL (ref 0.1–1.0)
Neutro Abs: 4.9 10*3/uL (ref 1.7–7.7)
Neutrophils Relative %: 80 %
Platelets: 136 10*3/uL — ABNORMAL LOW (ref 150–400)
RBC: 5.12 MIL/uL (ref 4.22–5.81)
RDW: 13.1 % (ref 11.5–15.5)
WBC: 6.1 10*3/uL (ref 4.0–10.5)
nRBC: 0 % (ref 0.0–0.2)

## 2018-08-07 LAB — COMPREHENSIVE METABOLIC PANEL
ALK PHOS: 49 U/L (ref 38–126)
ALT: 58 U/L — ABNORMAL HIGH (ref 0–44)
AST: 46 U/L — ABNORMAL HIGH (ref 15–41)
Albumin: 3.9 g/dL (ref 3.5–5.0)
Anion gap: 12 (ref 5–15)
BUN: 19 mg/dL (ref 6–20)
CALCIUM: 9.5 mg/dL (ref 8.9–10.3)
CO2: 24 mmol/L (ref 22–32)
Chloride: 99 mmol/L (ref 98–111)
Creatinine, Ser: 0.98 mg/dL (ref 0.61–1.24)
GFR calc Af Amer: >60 mL/min (ref >60)
GFR calc non Af Amer: >60 mL/min (ref >60)
Glucose, Bld: 147 mg/dL — ABNORMAL HIGH (ref 70–99)
Potassium: 3.3 mmol/L — ABNORMAL LOW (ref 3.5–5.1)
Sodium: 135 mmol/L (ref 135–145)
TOTAL PROTEIN: 8.2 g/dL — AB (ref 6.5–8.1)
Total Bilirubin: 0.9 mg/dL (ref 0.3–1.2)

## 2018-08-07 LAB — INFLUENZA PANEL BY PCR (TYPE A & B)
Influenza A By PCR: NEGATIVE
Influenza B By PCR: NEGATIVE

## 2018-08-07 LAB — BRAIN NATRIURETIC PEPTIDE: B Natriuretic Peptide: 15 pg/mL (ref 0.0–100.0)

## 2018-08-07 LAB — TROPONIN I: Troponin I: <0.03 ng/mL (ref <0.03)

## 2018-08-07 MED ORDER — KETOROLAC TROMETHAMINE 30 MG/ML IJ SOLN
30.0000 mg | Freq: Once | INTRAMUSCULAR | Status: AC
  Administered 2018-08-07: 30 mg via INTRAVENOUS
  Filled 2018-08-07: qty 1

## 2018-08-07 MED ORDER — ACETAMINOPHEN 325 MG PO TABS
650.0000 mg | ORAL_TABLET | Freq: Once | ORAL | Status: DC
  Filled 2018-08-07: qty 2

## 2018-08-07 MED ORDER — METOCLOPRAMIDE HCL 5 MG/ML IJ SOLN
10.0000 mg | Freq: Once | INTRAMUSCULAR | Status: AC
  Administered 2018-08-07: 10 mg via INTRAVENOUS
  Filled 2018-08-07: qty 2

## 2018-08-07 NOTE — ED Notes (Signed)
EKG completed

## 2018-08-07 NOTE — ED Notes (Signed)
Another Lav tube sent to lab.

## 2018-08-07 NOTE — ED Triage Notes (Signed)
Pt to ED via POV body aches, fever, and chills. 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 Pt has not had contact with anyone out of the country. Pt has not been out of country. Pt is in NAD at this time.

## 2018-08-07 NOTE — ED Notes (Signed)
Pt requesting this RN remove his IV so he can leave. Explained to pt importance of staying. States he understands but he was more comfortable at home. Requested pt wait until this RN talks with EDP. Pt agreed. Let EDP Kinner know pt requesting to leave.

## 2018-08-07 NOTE — ED Notes (Signed)
Pt given sip of water  ?

## 2018-08-07 NOTE — ED Notes (Signed)
First Nurse Note: Pt arrives to ED stating that he wants to be evaluated for Coronavirus. 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   Pt was given mask and charge RN was called, Pt was taken out of waiting area and into room 11 using the outside side entrance.

## 2018-08-07 NOTE — ED Provider Notes (Signed)
[facility] Emergency Department Provider Note       Time seen: ----------------------------------------- 4:40 PM on 08/07/2018 -----------------------------------------   I have reviewed the triage vital signs and the nursing notes.  HISTORY   Chief Complaint flu like symptoms    HPI Bryce [name] is a 53 y.o. male with a history of CHF, coronary disease, hypertension, MI, migraine who presents to the ED for headaches, chills.  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  He has not had a contact or recent travel.  Patient was noted to have tachycardia on arrival which she states is not normal for him.  He has not had any fever but has had chills and body aches.  He denies vomiting or diarrhea.  Past Medical History:  Diagnosis Date  . Chronic systolic congestive heart failure (HCC)   . Coronary artery disease   . Hypertension   . Ischemic cardiomyopathy   . MI (myocardial infarction) (HCC)   . Migraine     Patient Active Problem List   Diagnosis Date Noted  . Acute renal failure (ARF) (HCC) 01/13/2018  . CAD (coronary artery disease) 02/20/2017  . Dehydration 02/20/2017  . STEMI (ST elevation myocardial infarction) (HCC) 02/02/2017  . AKI (acute kidney injury) (HCC) 12/15/2015  . Solitary pulmonary nodule 12/23/2014  . Chest pain 12/22/2014  . Hypertension 12/22/2014  . Hyperlipidemia 12/22/2014    Past Surgical History:  Procedure Laterality Date  . CARDIAC CATHETERIZATION N/A 12/28/2014   Procedure: Left Heart Cath and Coronary Angiography;  Surgeon: Alwyn Pea, MD;  Location: [facility];  Service: Cardiovascular;  Laterality: N/A;  . CORONARY ANGIOPLASTY     Non-ST elevation MI status post stenting within bare-metal stent of the 75% lesion of the LAD, 11/13/2010.   . CORONARY STENT INTERVENTION N/A 02/02/2017   Procedure: CORONARY/GRAFT ACUTE MI REVASCULARIZATION;  Surgeon: Alwyn Pea, MD;  Location: [facility];  Service: Cardiovascular;  Laterality: N/A;  . LEFT HEART CATH AND CORONARY ANGIOGRAPHY N/A 02/02/2017   Procedure: LEFT HEART CATH AND CORONARY ANGIOGRAPHY;  Surgeon: Alwyn Pea, MD;  Location: [facility];  Service: Cardiovascular;  Laterality: N/A;    Allergies Atorvastatin  Social History Social History   Tobacco Use  . Smoking status: Current Every Day Smoker    Packs/day: 1.00    Years: 5.00    Pack years: 5.00    Types: Cigarettes    Last attempt to quit: 01/05/2017    Years since quitting: 1.5  . Smokeless tobacco: Never Used  Substance Use Topics  . Alcohol use: Not Currently  . Drug use: Yes    Types: Marijuana   Review of Systems Constitutional: Positive for chills and body aches Cardiovascular: Negative for chest pain. Respiratory: Negative for shortness of breath.  Negative for cough Gastrointestinal: Negative for abdominal pain, vomiting and diarrhea. Musculoskeletal: Negative for back pain. Skin: Negative for rash. Neurological: Positive for headache  All systems negative/normal/unremarkable except as stated in the HPI  ____________________________________________   PHYSICAL EXAM:  VITAL SIGNS: ED Triage Vitals  Enc Vitals Group     BP 08/07/18 1345 121/89     Pulse Rate 08/07/18 1345 (!) 117     Resp 08/07/18 1345 16     Temp 08/07/18 1347 99 F (37.2 C)     Temp Source 08/07/18 1347 Oral     SpO2 08/07/18 1345 97 %  Weight 08/07/18 1344 240 lb (108.9 kg)     Height 08/07/18 1344 5\' 8"  (1.727 m)     Head Circumference --      Peak Flow --      Pain Score 08/07/18 1344 9     Pain Loc --      Pain Edu? --      Excl. in GC? --    Constitutional: Alert and oriented. Well appearing and in no distress. Eyes: Conjunctivae are normal. Normal extraocular movements. ENT      Head: Normocephalic and atraumatic.      Nose: No congestion/rhinnorhea.      Mouth/Throat: Mucous membranes  are moist.      Neck: No stridor. Cardiovascular: Normal rate, regular rhythm. No murmurs, rubs, or gallops. Respiratory: Normal respiratory effort without tachypnea nor retractions. Breath sounds are clear and equal bilaterally. No wheezes/rales/rhonchi. Gastrointestinal: Soft and nontender. Normal bowel sounds Musculoskeletal: Nontender with normal range of motion in extremities. No lower extremity tenderness nor edema. Neurologic:  Normal speech and language. No gross focal neurologic deficits are appreciated.  Skin:  Skin is warm, dry and intact. No rash noted. Psychiatric: Mood and affect are normal. Speech and behavior are normal.  ____________________________________________  EKG: Interpreted by me.  Sinus tachycardia with a rate of 118 bpm, left axis deviation, left bundle branch block, long QT  ____________________________________________  ED COURSE:  As part of my medical decision making, I reviewed the following data within the electronic MEDICAL RECORD NUMBER History obtained from family if available, nursing notes, old chart and ekg, as well as notes from prior ED visits. Patient presented for multiple complaints, we will assess with labs and imaging as indicated at this time.   Procedures ____________________________________________   LABS (pertinent positives/negatives)  Labs Reviewed  CBC WITH DIFFERENTIAL/PLATELET - Abnormal; Notable for the following components:      Result Value   Platelets 136 (*)    Lymphs Abs 0.6 (*)    All other components within normal limits  COMPREHENSIVE METABOLIC PANEL - Abnormal; Notable for the following components:   Potassium 3.3 (*)    Glucose, Bld 147 (*)    Total Protein 8.2 (*)    AST 46 (*)    ALT 58 (*)    All other components within normal limits  INFLUENZA PANEL BY PCR (TYPE A & B)  TROPONIN I  BRAIN NATRIURETIC PEPTIDE    RADIOLOGY Images were viewed by me  Chest x-ray IMPRESSION: Enlargement of cardiac silhouette  with minimal RIGHT basilar atelectasis. ____________________________________________   DIFFERENTIAL DIAGNOSIS   Arrhythmia, CHF, influenza, pneumonia, viral syndrome, anxiety  FINAL ASSESSMENT AND PLAN  Headache, tachycardia   Plan: The patient had presented for flulike symptoms with noted tachycardia. Patient's labs were overall reassuring. Patient's imaging did not reveal any acute process.  He was given headache medication and his symptoms are likely multifactorial.  Heart rate is 103 at this time.  I will advise close outpatient follow-up with his doctor.   Ulice Dash, MD    Note: This note was generated in part or whole with voice recognition software. Voice recognition is usually quite accurate but there are transcription errors that can and very often do occur. I apologize for any typographical errors that were not detected and corrected.     Emily Filbert, MD 08/07/18 [phone]

## 2018-08-07 NOTE — ED Notes (Signed)
Pt given blankets and pillow

## 2018-08-07 NOTE — ED Notes (Signed)
Lav/Grn/Red collected. Sent to lab.

## 2018-08-07 NOTE — ED Notes (Signed)
Pt refuses tylenol for his HA. States it will not help.

## 2018-08-17 ENCOUNTER — Other Ambulatory Visit: Payer: Self-pay | Admitting: Gerontology

## 2018-08-17 DIAGNOSIS — M5136 Other intervertebral disc degeneration, lumbar region: Secondary | ICD-10-CM

## 2018-09-17 ENCOUNTER — Ambulatory Visit: Payer: Self-pay | Admitting: Pharmacist

## 2018-09-17 ENCOUNTER — Other Ambulatory Visit: Payer: Self-pay | Admitting: Gerontology

## 2018-09-17 DIAGNOSIS — I10 Essential (primary) hypertension: Secondary | ICD-10-CM

## 2018-09-17 DIAGNOSIS — I213 ST elevation (STEMI) myocardial infarction of unspecified site: Secondary | ICD-10-CM

## 2018-10-09 NOTE — Progress Notes (Signed)
Pt no show

## 2018-10-12 ENCOUNTER — Other Ambulatory Visit: Payer: Self-pay

## 2018-10-12 ENCOUNTER — Ambulatory Visit: Payer: Medicaid Other | Admitting: Pharmacist

## 2018-10-12 ENCOUNTER — Encounter: Payer: Self-pay | Admitting: Pharmacist

## 2018-10-12 DIAGNOSIS — Z79899 Other long term (current) drug therapy: Secondary | ICD-10-CM

## 2018-10-12 NOTE — Progress Notes (Signed)
Medication Management Clinic Visit Note  Patient: Bryce [name] MRN: [id number] Date of Birth: [birth year] PCP: System, Pcp Not In   Bryce [name] 53 y.o. male, contacted today for an outreach call- medication therapy management review. He was verified by name and date of birth. He applied for disability a bout 4 weeks ago and is awaiting the outcome.  There were no vitals taken for this visit.  Patient Information   Past Medical History:  Diagnosis Date  . Chronic systolic congestive heart failure (HCC)   . Coronary artery disease   . Hypertension   . Ischemic cardiomyopathy   . MI (myocardial infarction) (HCC)   . Migraine       Past Surgical History:  Procedure Laterality Date  . CARDIAC CATHETERIZATION N/A 12/28/2014   Procedure: Left Heart Cath and Coronary Angiography;  Surgeon: Alwyn Pea, MD;  Location: [facility];  Service: Cardiovascular;  Laterality: N/A;  . CORONARY ANGIOPLASTY     Non-ST elevation MI status post stenting within bare-metal stent of the 75% lesion of the LAD, 11/13/2010.   . CORONARY STENT INTERVENTION N/A 02/02/2017   Procedure: CORONARY/GRAFT ACUTE MI REVASCULARIZATION;  Surgeon: Alwyn Pea, MD;  Location: [facility];  Service: Cardiovascular;  Laterality: N/A;  . LEFT HEART CATH AND CORONARY ANGIOGRAPHY N/A 02/02/2017   Procedure: LEFT HEART CATH AND CORONARY ANGIOGRAPHY;  Surgeon: Alwyn Pea, MD;  Location: [facility];  Service: Cardiovascular;  Laterality: N/A;     Family History  Problem Relation Age of Onset  . CAD Father   . Kidney disease Neg Hx   . Diabetes Mellitus II Neg Hx     New Diagnoses (since last visit):   Family Support: Good             Social History   Substance and Sexual Activity  Alcohol Use Not Currently      Social History   Tobacco Use  Smoking Status Current Every Day Smoker  . Packs/day: 1.00  . Years: 5.00  . Pack years: 5.00  . Types:  Cigarettes  . Last attempt to quit: 01/05/2017  . Years since quitting: 1.7  Smokeless Tobacco Never Used  Tobacco Comment   states smokes occasionally 10-12-18      Health Maintenance  Topic Date Due  . TETANUS/TDAP  10/11/1983  . COLONOSCOPY  10/11/2014  . INFLUENZA VACCINE  01/02/2019  . HIV Screening  Completed    Outpatient Encounter Medications as of 10/12/2018  Medication Sig  . aspirin EC 81 MG EC tablet Take 1 tablet (81 mg total) by mouth daily.  Marland Kitchen aspirin-acetaminophen-caffeine (EXCEDRIN MIGRAINE) 250-250-65 MG tablet Take 1 tablet by mouth daily as needed for headache.  . clopidogrel (PLAVIX) 75 MG tablet TAKE ONE TABLET BY MOUTH EVERY DAY  . Docosanol (ABREVA) 10 % CREA Apply 1 application topically 2 (two) times daily.  Marland Kitchen lisinopril (PRINIVIL,ZESTRIL) 20 MG tablet TAKE ONE TABLET BY MOUTH EVERY DAY  . meloxicam (MOBIC) 15 MG tablet TAKE ONE TABLET BY MOUTH EVERY DAY AS NEEDED FOR PAIN  . metoprolol tartrate (LOPRESSOR) 25 MG tablet Take 0.5 tablets (12.5 mg total) by mouth 2 (two) times daily. (Patient taking differently: Take 25 mg by mouth 2 (two) times daily. )  . nitroGLYCERIN (NITROSTAT) 0.4 MG SL tablet Place 1 tablet (0.4 mg total) under the tongue every 5 (five) minutes x 3 doses as needed for chest pain.  . pantoprazole (PROTONIX) 40 MG tablet  Take 1 tablet (40 mg total) by mouth daily.  . pravastatin (PRAVACHOL) 40 MG tablet Take 1 tablet (40 mg total) by mouth daily.   No facility-administered encounter medications on file as of 10/12/2018.      Assessment:  Medication Compliance: Take medications as prescribed. Patient is not currently using a pill box and denies missing any doses. He was able to verify his current medication regimen over the phone. [facility] is providing assistance with all of his prescription medications.  STEMI/CAD/CHF: Patient is currently on aspirin, clopidogrel, metoprolol, lisinopril, pravastatin and as needed  nitroglycerin. He has not recently used the nitroglycerin. He has not been able to follow up with a cardiologist due to cost.  Per [facility] note: Status post non-STEMI, BMS LAD 11/13/10 and lateral wall STEMI 02/02/17 with DES mid to distal left circumflex.   Dyslipidemia: Currently taking pravastatin (moderate intensity), tolerating well. Developed myalgias on atorvastatin. TC = 178 mg/dl; TG = 096261 mg/dl; HDL = 44 mg/dl; LDL = 82 mg/dl (0/45/401/29/20).  HTN: Currently taking lisinopril and metoprolol. States his metoprolol was increased to 25 mg twice daily about a month ago due to increased heart rate.   Back/Hand Pain: Currently using meloxicam for back pain and arthritis in hands. He does not use the meloxicam when he notices swelling/edema. States he has not had any issues with swelling. Patient aware of the risks with his medical history.  GERD: Currently taking pantoprazole and occasionally uses Tums in the evening.  Lifestyle: Patient states he "stays active". He continues to use salt, but states he sweats a lot and cramps. He has significantly reduced fried foods and increased his fruit. He admits to smoking tobacco, but states this is not every day.  PLAN: Request new metoprolol Rx from [facility]  [facility] for [facility] with charity care application [facility] and 10/29/18 with the provider (will request a new metoprolol Rx) Return in 1 year for MTM follow up  Bryce [name], BS, PharmD [facility] Clinic-Pharmacy Operations Coordinator [phone]

## 2018-10-19 ENCOUNTER — Telehealth: Payer: Self-pay | Admitting: Pharmacy Technician

## 2018-10-19 NOTE — Telephone Encounter (Signed)
Have attempted to contact patient twice to assist with paperwork for recertification for [facility] program and for [facility] charity care.  Sherilyn Dacosta Care Manager [facility]

## 2018-10-21 ENCOUNTER — Other Ambulatory Visit: Payer: Medicaid Other

## 2018-10-21 ENCOUNTER — Other Ambulatory Visit: Payer: Self-pay

## 2018-10-21 DIAGNOSIS — Z0000 Encounter for general adult medical examination without abnormal findings: Secondary | ICD-10-CM

## 2018-10-22 LAB — CBC WITH DIFFERENTIAL/PLATELET
Basophils Absolute: 0 10*3/uL (ref 0.0–0.2)
Basos: 0 %
EOS (ABSOLUTE): 0.1 10*3/uL (ref 0.0–0.4)
Eos: 2 %
Hematocrit: 42.8 % (ref 37.5–51.0)
Hemoglobin: 14.6 g/dL (ref 13.0–17.7)
Immature Grans (Abs): 0 10*3/uL (ref 0.0–0.1)
Immature Granulocytes: 0 %
Lymphocytes Absolute: 1.5 10*3/uL (ref 0.7–3.1)
Lymphs: 31 %
MCH: 31.9 pg (ref 26.6–33.0)
MCHC: 34.1 g/dL (ref 31.5–35.7)
MCV: 93 fL (ref 79–97)
Monocytes Absolute: 0.4 10*3/uL (ref 0.1–0.9)
Monocytes: 9 %
Neutrophils Absolute: 2.7 10*3/uL (ref 1.4–7.0)
Neutrophils: 58 %
Platelets: 217 10*3/uL (ref 150–450)
RBC: 4.58 x10E6/uL (ref 4.14–5.80)
RDW: 13.3 % (ref 11.6–15.4)
WBC: 4.8 10*3/uL (ref 3.4–10.8)

## 2018-10-22 LAB — LIPID PANEL
Chol/HDL Ratio: 4.2 ratio (ref 0.0–5.0)
Cholesterol, Total: 189 mg/dL (ref 100–199)
HDL: 45 mg/dL (ref >39)
LDL Calculated: 120 mg/dL — ABNORMAL HIGH (ref 0–99)
Triglycerides: 120 mg/dL (ref 0–149)
VLDL Cholesterol Cal: 24 mg/dL (ref 5–40)

## 2018-10-22 LAB — COMPREHENSIVE METABOLIC PANEL
ALT: 24 IU/L (ref 0–44)
AST: 17 IU/L (ref 0–40)
Albumin/Globulin Ratio: 2 (ref 1.2–2.2)
Albumin: 4.4 g/dL (ref 3.8–4.9)
Alkaline Phosphatase: 61 IU/L (ref 39–117)
BUN/Creatinine Ratio: 9 (ref 9–20)
BUN: 11 mg/dL (ref 6–24)
Bilirubin Total: 0.3 mg/dL (ref 0.0–1.2)
CO2: 21 mmol/L (ref 20–29)
Calcium: 9 mg/dL (ref 8.7–10.2)
Chloride: 104 mmol/L (ref 96–106)
Creatinine, Ser: 1.16 mg/dL (ref 0.76–1.27)
GFR calc Af Amer: 82 mL/min/{1.73_m2} (ref >59)
GFR calc non Af Amer: 71 mL/min/{1.73_m2} (ref >59)
Globulin, Total: 2.2 g/dL (ref 1.5–4.5)
Glucose: 127 mg/dL — ABNORMAL HIGH (ref 65–99)
Potassium: 4.2 mmol/L (ref 3.5–5.2)
Sodium: 141 mmol/L (ref 134–144)
Total Protein: 6.6 g/dL (ref 6.0–8.5)

## 2018-10-22 LAB — URINALYSIS
Bilirubin, UA: NEGATIVE
Glucose, UA: NEGATIVE
Ketones, UA: NEGATIVE
Leukocytes,UA: NEGATIVE
Nitrite, UA: NEGATIVE
Protein,UA: NEGATIVE
RBC, UA: NEGATIVE
Specific Gravity, UA: >=1.03 — AB (ref 1.005–1.030)
Urobilinogen, Ur: 0.2 mg/dL (ref 0.2–1.0)
pH, UA: 5 (ref 5.0–7.5)

## 2018-10-22 LAB — HEMOGLOBIN A1C
Est. average glucose Bld gHb Est-mCnc: 114 mg/dL
Hgb A1c MFr Bld: 5.6 % (ref 4.8–5.6)

## 2018-10-27 ENCOUNTER — Other Ambulatory Visit: Payer: Self-pay | Admitting: Gerontology

## 2018-10-27 DIAGNOSIS — M5136 Other intervertebral disc degeneration, lumbar region: Secondary | ICD-10-CM

## 2018-10-29 ENCOUNTER — Ambulatory Visit: Payer: Medicaid Other | Admitting: Gerontology

## 2018-10-29 ENCOUNTER — Other Ambulatory Visit: Payer: Self-pay

## 2018-10-29 DIAGNOSIS — I252 Old myocardial infarction: Secondary | ICD-10-CM

## 2018-10-29 DIAGNOSIS — I10 Essential (primary) hypertension: Secondary | ICD-10-CM

## 2018-10-29 DIAGNOSIS — Z8739 Personal history of other diseases of the musculoskeletal system and connective tissue: Secondary | ICD-10-CM

## 2018-10-29 MED ORDER — METOPROLOL TARTRATE 25 MG PO TABS: 25.0000 mg | ORAL_TABLET | Freq: Two times a day (BID) | ORAL | 0 refills | Status: DC

## 2018-10-29 NOTE — Progress Notes (Signed)
Established Patient Office Visit  Subjective:  Patient ID: Bryce [name], male    DOB: 1965-12-15  Age: 53 y.o. MRN: [id number]  CC:  Chief Complaint  Patient presents with  . Follow-up    high blood pressure   Patient consents to telephone visit and two patient identifiers was used to identify patient.  HPI Bryce [name] presents for follow up for hypertension, history of STEMI and chronic artritis to hands. He was  treated at the ED on 3//6/20 for headache and tachycardia. Chest x ray done during ED visit showed enlargement of cardiac silhouette with minimal right basilar atelectasis by Dr Marquette Saa, and Sinus tachycardia, left axis deviation, left bundle branch block and long QT on EKG per Dr Wilfred Lacy. He states that he wasn't able to follow up with his previous Cardiologist at [facility] clinic because of financial concerns.   He reports that he continues to take 20 mg lisinopril and metoprolol 25 mg bid. He states that he checks his blood pressure at home but it was last checked 2 days ago and it was 150/ 78. He denies chest pain, headache, but he reports that he had an episode of palpitation last week, which resolved in less than 5 minutes. He also reports that he has a history of arthritis to his hands and continues to experience constant 5/10 non radiating dull pain. He states that taking meloxicam 15 mg as needed relieves pain. He denies chest pain, palpitation , fever, chills and no further concerns.   Past Medical History:  Diagnosis Date  . Chronic systolic congestive heart failure (HCC)   . Coronary artery disease   . Hypertension   . Ischemic cardiomyopathy   . MI (myocardial infarction) (HCC)   . Migraine     Past Surgical History:  Procedure Laterality Date  . CARDIAC CATHETERIZATION N/A 12/28/2014   Procedure: Left Heart Cath and Coronary Angiography;  Surgeon: Alwyn Pea, MD;  Location: [facility];  Service: Cardiovascular;  Laterality: N/A;  .  CORONARY ANGIOPLASTY     Non-ST elevation MI status post stenting within bare-metal stent of the 75% lesion of the LAD, 11/13/2010.   . CORONARY STENT INTERVENTION N/A 02/02/2017   Procedure: CORONARY/GRAFT ACUTE MI REVASCULARIZATION;  Surgeon: Alwyn Pea, MD;  Location: [facility];  Service: Cardiovascular;  Laterality: N/A;  . LEFT HEART CATH AND CORONARY ANGIOGRAPHY N/A 02/02/2017   Procedure: LEFT HEART CATH AND CORONARY ANGIOGRAPHY;  Surgeon: Alwyn Pea, MD;  Location: [facility];  Service: Cardiovascular;  Laterality: N/A;    Family History  Problem Relation Age of Onset  . CAD Father   . Kidney disease Neg Hx   . Diabetes Mellitus II Neg Hx     Social History   Socioeconomic History  . Marital status: Single    Spouse name: Not on file  . Number of children: Not on file  . Years of education: Not on file  . Highest education level: Not on file  Occupational History  . Occupation: Merchant navy officer  Social Needs  . Financial resource strain: Not on file  . Food insecurity:    Worry: Not on file    Inability: Not on file  . Transportation needs:    Medical: Not on file    Non-medical: Not on file  Tobacco Use  . Smoking status: Current Every Day Smoker    Packs/day: 1.00    Years: 5.00  Pack years: 5.00    Types: Cigarettes    Last attempt to quit: 01/05/2017    Years since quitting: 1.8  . Smokeless tobacco: Never Used  . Tobacco comment: states smokes occasionally 10-12-18  Substance and Sexual Activity  . Alcohol use: Not Currently  . Drug use: Yes    Types: Marijuana  . Sexual activity: Yes  Lifestyle  . Physical activity:    Days per week: Not on file    Minutes per session: Not on file  . Stress: Not on file  Relationships  . Social connections:    Talks on phone: Not on file    Gets together: Not on file    Attends religious service: Not on file    Active member of club or organization: Not on file    Attends  meetings of clubs or organizations: Not on file    Relationship status: Not on file  . Intimate partner violence:    Fear of current or ex partner: Not on file    Emotionally abused: Not on file    Physically abused: Not on file    Forced sexual activity: Not on file  Other Topics Concern  . Not on file  Social History Narrative  . Not on file    Outpatient Medications Prior to Visit  Medication Sig Dispense Refill  . aspirin EC 81 MG EC tablet Take 1 tablet (81 mg total) by mouth daily. 30 tablet 0  . aspirin-acetaminophen-caffeine (EXCEDRIN MIGRAINE) 250-250-65 MG tablet Take 1 tablet by mouth daily as needed for headache. 30 tablet 0  . clopidogrel (PLAVIX) 75 MG tablet TAKE ONE TABLET BY MOUTH EVERY DAY 90 tablet 0  . Docosanol (ABREVA) 10 % CREA Apply 1 application topically 2 (two) times daily. 1 Tube 0  . lisinopril (PRINIVIL,ZESTRIL) 20 MG tablet TAKE ONE TABLET BY MOUTH EVERY DAY 90 tablet 0  . nitroGLYCERIN (NITROSTAT) 0.4 MG SL tablet Place 1 tablet (0.4 mg total) under the tongue every 5 (five) minutes x 3 doses as needed for chest pain. 30 tablet 0  . pantoprazole (PROTONIX) 40 MG tablet Take 1 tablet (40 mg total) by mouth daily. 30 tablet 0  . pravastatin (PRAVACHOL) 40 MG tablet Take 1 tablet (40 mg total) by mouth daily. 90 tablet 3  . meloxicam (MOBIC) 15 MG tablet TAKE ONE TABLET BY MOUTH EVERY DAY AS NEEDED FOR PAIN 30 tablet 0  . metoprolol tartrate (LOPRESSOR) 25 MG tablet Take 0.5 tablets (12.5 mg total) by mouth 2 (two) times daily. (Patient taking differently: Take 25 mg by mouth 2 (two) times daily. ) 30 tablet 3   No facility-administered medications prior to visit.     Allergies  Allergen Reactions  . Atorvastatin     Myalgias    ROS Review of Systems  Constitutional: Negative.   Respiratory: Negative.   Cardiovascular: Negative.   Gastrointestinal: Negative.   Genitourinary: Negative.   Musculoskeletal: Negative.   Skin: Negative.    Neurological: Negative.   Psychiatric/Behavioral: Negative.       Objective:    Physical Exam No vital sign and PE done There were no vitals taken for this visit. Wt Readings from Last 3 Encounters:  08/07/18 240 lb (108.9 kg)  07/14/18 249 lb (112.9 kg)  05/12/18 240 lb (108.9 kg)     Health Maintenance Due  Topic Date Due  . TETANUS/TDAP  10/11/1983  . COLONOSCOPY  10/11/2014    There are no preventive care reminders to display  for this patient.  Lab Results  Component Value Date   TSH 1.624 12/22/2014   Lab Results  Component Value Date   WBC 4.8 10/21/2018   HGB 14.6 10/21/2018   HCT 42.8 10/21/2018   MCV 93 10/21/2018   PLT 217 10/21/2018   Lab Results  Component Value Date   NA 141 10/21/2018   K 4.2 10/21/2018   CO2 21 10/21/2018   GLUCOSE 127 (H) 10/21/2018   BUN 11 10/21/2018   CREATININE 1.16 10/21/2018   BILITOT 0.3 10/21/2018   ALKPHOS 61 10/21/2018   AST 17 10/21/2018   ALT 24 10/21/2018   PROT 6.6 10/21/2018   ALBUMIN 4.4 10/21/2018   CALCIUM 9.0 10/21/2018   ANIONGAP 12 08/07/2018   Lab Results  Component Value Date   CHOL 189 10/21/2018   Lab Results  Component Value Date   HDL 45 10/21/2018   Lab Results  Component Value Date   LDLCALC 120 (H) 10/21/2018   He was advised to continue on low fat , low cholesterol diet, exercise and lose weight. Lab Results  Component Value Date   TRIG 120 10/21/2018   Lab Results  Component Value Date   CHOLHDL 4.2 10/21/2018   Lab Results  Component Value Date   HGBA1C 5.6 10/21/2018      Assessment & Plan:    1. Essential hypertension - He will continue on current treatment regimen, was advised to check, document and bring blood pressure log to next visit. - He was advised to continue on DASH diet, and exercise as tolerated. - metoprolol tartrate (LOPRESSOR) 25 MG tablet; Take 1 tablet (25 mg total) by mouth 2 (two) times daily.  Dispense: 60 tablet; Refill: 0  2. History of  ST elevation myocardial infarction (STEMI) - He was encouraged to complete charity care application for - Ambulatory referral to Cardiology.  3. History of chronic arthritis - He will continue on current treatment regimen and was notified to go to the ED with worsening symptoms.    Follow-up: Return in about 1 week (around 11/05/2018), or if symptoms worsen or fail to improve.    Koralee Wedeking Trellis Paganini, NP

## 2018-10-30 ENCOUNTER — Telehealth: Payer: Self-pay | Admitting: Cardiovascular Disease

## 2018-10-30 NOTE — Telephone Encounter (Signed)
Virtual Visit Pre-Appointment Phone Call  "(Name), I am calling you today to discuss your upcoming appointment. We are currently trying to limit exposure to the virus that causes COVID-19 by seeing patients at home rather than in the office."  1. "What is the BEST phone number to call the day of the visit?" - include this in appointment notes  2. Do you have or have access to (through a family member/friend) a smartphone with video capability that we can use for your visit?" a. If yes - list this number in appt notes as cell (if different from BEST phone #) and list the appointment type as a VIDEO visit in appointment notes b. If no - list the appointment type as a PHONE visit in appointment notes  3. Confirm consent - "In the setting of the current Covid19 crisis, you are scheduled for a (phone or video) visit with your provider on (date) at (time).  Just as we do with many in-office visits, in order for you to participate in this visit, we must obtain consent.  If you'd like, I can send this to your mychart (if signed up) or email for you to review.  Otherwise, I can obtain your verbal consent now.  All virtual visits are billed to your insurance company just like a normal visit would be.  By agreeing to a virtual visit, we'd like you to understand that the technology does not allow for your provider to perform an examination, and thus may limit your provider's ability to fully assess your condition. If your provider identifies any concerns that need to be evaluated in person, we will make arrangements to do so.  Finally, though the technology is pretty good, we cannot assure that it will always work on either your or our end, and in the setting of a video visit, we may have to convert it to a phone-only visit.  In either situation, we cannot ensure that we have a secure connection.  Are you willing to proceed?" STAFF: Did the patient verbally acknowledge consent to telehealth visit? Document  YES/NO here: yes  4. Advise patient to be prepared - "Two hours prior to your appointment, go ahead and check your blood pressure, pulse, oxygen saturation, and your weight (if you have the equipment to check those) and write them all down. When your visit starts, your provider will ask you for this information. If you have an Apple Watch or Kardia device, please plan to have heart rate information ready on the day of your appointment. Please have a pen and paper handy nearby the day of the visit as well."  5. Give patient instructions for MyChart download to smartphone OR [email or web address] as below if video visit (depending on what platform provider is using)  6. Inform patient they will receive a phone call 15 minutes prior to their appointment time (may be from unknown caller ID) so they should be prepared to answer    TELEPHONE CALL NOTE  Bryce [name] has been deemed a candidate for a follow-up tele-health visit to limit community exposure during the Covid-19 pandemic. I spoke with the patient via phone to ensure availability of phone/video source, confirm preferred email & phone number, and discuss instructions and expectations.  I reminded Bryce BanRodney W [name] to be prepared with any vital sign and/or heart rhythm information that could potentially be obtained via home monitoring, at the time of his visit. I reminded Bryce BanRodney W [name] to expect a phone call prior to  his visit.  Joline Maxcy 10/30/2018 12:31 PM   INSTRUCTIONS FOR DOWNLOADING THE MYCHART APP TO SMARTPHONE  - The patient must first make sure to have activated MyChart and know their login information - If Apple, go to [employer] and type in MyChart in the search bar and download the app. If Android, ask patient to go to [employer] and type in [place] in the search bar and download the app. The app is free but as with any other app downloads, their phone may require them to verify saved payment information or  Apple/Android password.  - The patient will need to then log into the app with their MyChart username and password, and select [facility] as their healthcare provider to link the account. When it is time for your visit, go to the MyChart app, find appointments, and click Begin Video Visit. Be sure to Select Allow for your device to access the Microphone and Camera for your visit. You will then be connected, and your provider will be with you shortly.  **If they have any issues connecting, or need assistance please contact MyChart service desk [phone])**  **If using a computer, in order to ensure the best quality for their visit they will need to use either of the following Internet Browsers: [employer] or Google Chrome**  IF USING DOXIMITY or DOXY.ME - The patient will receive a link just prior to their visit by text.     FULL LENGTH CONSENT FOR TELE-HEALTH VISIT   I hereby voluntarily request, consent and authorize [facility] and its employed or contracted physicians, physician assistants, nurse practitioners or other licensed health care professionals (the Practitioner), to provide me with telemedicine health care services (the Services") as deemed necessary by the treating Practitioner. I acknowledge and consent to receive the Services by the Practitioner via telemedicine. I understand that the telemedicine visit will involve communicating with the Practitioner through live audiovisual communication technology and the disclosure of certain medical information by electronic transmission. I acknowledge that I have been given the opportunity to request an in-person assessment or other available alternative prior to the telemedicine visit and am voluntarily participating in the telemedicine visit.  I understand that I have the right to withhold or withdraw my consent to the use of telemedicine in the course of my care at any time, without affecting my right to future care  or treatment, and that the Practitioner or I may terminate the telemedicine visit at any time. I understand that I have the right to inspect all information obtained and/or recorded in the course of the telemedicine visit and may receive copies of available information for a reasonable fee.  I understand that some of the potential risks of receiving the Services via telemedicine include:   Delay or interruption in medical evaluation due to technological equipment failure or disruption;  Information transmitted may not be sufficient (e.g. poor resolution of images) to allow for appropriate medical decision making by the Practitioner; and/or   In rare instances, security protocols could fail, causing a breach of personal health information.  Furthermore, I acknowledge that it is my responsibility to provide information about my medical history, conditions and care that is complete and accurate to the best of my ability. I acknowledge that Practitioner's advice, recommendations, and/or decision may be based on factors not within their control, such as incomplete or inaccurate data provided by me or distortions of diagnostic images or specimens that may result from electronic transmissions. I  understand that the practice of medicine is not an exact science and that Practitioner makes no warranties or guarantees regarding treatment outcomes. I acknowledge that I will receive a copy of this consent concurrently upon execution via email to the email address I last provided but may also request a printed copy by calling the office of [facility].    I understand that my insurance will be billed for this visit.   I have read or had this consent read to me.  I understand the contents of this consent, which adequately explains the benefits and risks of the Services being provided via telemedicine.   I have been provided ample opportunity to ask questions regarding this consent and the Services and have had  my questions answered to my satisfaction.  I give my informed consent for the services to be provided through the use of telemedicine in my medical care  By participating in this telemedicine visit I agree to the above.

## 2018-11-02 DIAGNOSIS — I25118 Atherosclerotic heart disease of native coronary artery with other forms of angina pectoris: Secondary | ICD-10-CM | POA: Insufficient documentation

## 2018-11-02 NOTE — Progress Notes (Signed)
Virtual Visit via Video Note   This visit type was conducted due to national recommendations for restrictions regarding the COVID-19 Pandemic (e.g. social distancing) in an effort to limit this patient's exposure and mitigate transmission in our community.  Due to his co-morbid illnesses, this patient is at least at moderate risk for complications without adequate follow up.  This format is felt to be most appropriate for this patient at this time.  All issues noted in this document were discussed and addressed.  A limited physical exam was performed with this format.  Please refer to the patient's chart for his consent to telehealth for [facility].   I connected with  Bryce [name] on 11/03/18 by a video enabled telemedicine application and verified that I am speaking with the correct person using two identifiers. I discussed the limitations of evaluation and management by telemedicine. The patient expressed understanding and agreed to proceed.   Evaluation Performed:  Follow-up visit  Date:  11/03/2018   ID:  Bryce [name], Bryce [name] 1966/02/06, MRN [id number]  Patient Location:  [address] [place] [address]   Provider location:   [facility] [place] office  PCP:  Rolm Gala, NP  Cardiologist:  Fonnie Mu   Chief Complaint:  Fatigue, SOB   History of Present Illness:    Bryce [name] is a 53 y.o. male who presents via audio/video conferencing for a telehealth visit today.   The patient does not symptoms concerning for COVID-19 infection (fever, chills, cough, or new SHORTNESS OF BREATH).   Patient has a past medical history of 1. Status post non-STEMI, BMS LAD 11/13/2010 2. Status post lateral wall STEMI 02/02/2017 3. Status post DES mid to distal left circumflex 02/02/2017 4. Essential hypertension 5. Hyperlipidemia 6. Tobacco abuse Presenting to establish care in the [facility] office in [place], for his CAD, prior  PCI   Prior records reviewed [facility] 02/02/2017 with lateral wall ST elevation myocardial infarction.  Coronary angiography revealed 100% stenosis of distal left circumflex, and normal left ventricular function.  -- PCI to the mid to distal left circumflex with a 3.0 x 23 mm Xience Alpine DES.   2D echocardiogram on 02/03/17 revealed borderline mildly reduced left ventricular function with LVEF 45-50%.   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   He restarted metoprolol and lisinopril on 03/09/2017.   LDL cholesterol is 119 on 1/91/4782,  14 days ago, still 120 on pravastatin.   2-3 months ago,swelling, fatigue,  Went to the ER, BP elevated Metoprolol increased to 25 BID Rate 119 bpm, LBBB new Initial TNT normal, not rechecked BNP 15  Feels better no energy, Denies more tachycardia  Last stress test 12/2014   Prior CV studies:   The following studies were reviewed today:    Past Medical History:  Diagnosis Date  . Chronic systolic congestive heart failure (HCC)   . Coronary artery disease   . Hypertension   . Ischemic cardiomyopathy   . MI (myocardial infarction) (HCC)   . Migraine    Past Surgical History:  Procedure Laterality Date  . CARDIAC CATHETERIZATION N/A 12/28/2014   Procedure: Left Heart Cath and Coronary Angiography;  Surgeon: Alwyn Pea, MD;  Location: [facility] INVASIVE CV LAB;  Service: Cardiovascular;  Laterality: N/A;  . CORONARY ANGIOPLASTY     Non-ST elevation MI status post stenting within bare-metal stent  of the 75% lesion of the LAD, 11/13/2010.   . CORONARY STENT INTERVENTION N/A 02/02/2017   Procedure: CORONARY/GRAFT ACUTE MI REVASCULARIZATION;  Surgeon: Alwyn Peaallwood, Dwayne D, MD;  Location: [facility] INVASIVE CV LAB;  Service: Cardiovascular;  Laterality: N/A;  . LEFT HEART CATH AND CORONARY ANGIOGRAPHY N/A 02/02/2017   Procedure: LEFT HEART CATH AND CORONARY  ANGIOGRAPHY;  Surgeon: Alwyn Peaallwood, Dwayne D, MD;  Location: [facility] INVASIVE CV LAB;  Service: Cardiovascular;  Laterality: N/A;     Current Meds  Medication Sig  . aspirin EC 81 MG EC tablet Take 1 tablet (81 mg total) by mouth daily.  Marland Kitchen. aspirin-acetaminophen-caffeine (EXCEDRIN MIGRAINE) 250-250-65 MG tablet Take 1 tablet by mouth daily as needed for headache.  . clopidogrel (PLAVIX) 75 MG tablet TAKE ONE TABLET BY MOUTH EVERY DAY  . Docosanol (ABREVA) 10 % CREA Apply 1 application topically 2 (two) times daily.  Marland Kitchen. lisinopril (PRINIVIL,ZESTRIL) 20 MG tablet TAKE ONE TABLET BY MOUTH EVERY DAY  . meloxicam (MOBIC) 15 MG tablet TAKE ONE TABLET BY MOUTH EVERY DAY AS NEEDED FOR PAIN  . metoprolol tartrate (LOPRESSOR) 25 MG tablet Take 1 tablet (25 mg total) by mouth 2 (two) times daily.  . nitroGLYCERIN (NITROSTAT) 0.4 MG SL tablet Place 1 tablet (0.4 mg total) under the tongue every 5 (five) minutes x 3 doses as needed for chest pain.  . pantoprazole (PROTONIX) 40 MG tablet Take 1 tablet (40 mg total) by mouth daily.  . pravastatin (PRAVACHOL) 40 MG tablet Take 1 tablet (40 mg total) by mouth daily.     Allergies:   Atorvastatin   Social History   Tobacco Use  . Smoking status: Current Every Day Smoker    Packs/day: 1.00    Years: 5.00    Pack years: 5.00    Types: Cigarettes    Last attempt to quit: 01/05/2017    Years since quitting: 1.8  . Smokeless tobacco: Never Used  . Tobacco comment: states smokes occasionally 10-12-18  Substance Use Topics  . Alcohol use: Not Currently  . Drug use: Yes    Types: Marijuana     Current Outpatient Medications on File Prior to Visit  Medication Sig Dispense Refill  . aspirin EC 81 MG EC tablet Take 1 tablet (81 mg total) by mouth daily. 30 tablet 0  . aspirin-acetaminophen-caffeine (EXCEDRIN MIGRAINE) 250-250-65 MG tablet Take 1 tablet by mouth daily as needed for headache. 30 tablet 0  . clopidogrel (PLAVIX) 75 MG tablet TAKE ONE TABLET BY MOUTH  EVERY DAY 90 tablet 0  . Docosanol (ABREVA) 10 % CREA Apply 1 application topically 2 (two) times daily. 1 Tube 0  . lisinopril (PRINIVIL,ZESTRIL) 20 MG tablet TAKE ONE TABLET BY MOUTH EVERY DAY 90 tablet 0  . meloxicam (MOBIC) 15 MG tablet TAKE ONE TABLET BY MOUTH EVERY DAY AS NEEDED FOR PAIN 30 tablet 0  . metoprolol tartrate (LOPRESSOR) 25 MG tablet Take 1 tablet (25 mg total) by mouth 2 (two) times daily. 60 tablet 0  . nitroGLYCERIN (NITROSTAT) 0.4 MG SL tablet Place 1 tablet (0.4 mg total) under the tongue every 5 (five) minutes x 3 doses as needed for chest pain. 30 tablet 0  . pantoprazole (PROTONIX) 40 MG tablet Take 1 tablet (40 mg total) by mouth daily. 30 tablet 0  . pravastatin (PRAVACHOL) 40 MG tablet Take 1 tablet (40 mg total) by mouth daily. 90 tablet 3   No current facility-administered medications on file prior to visit.  Family Hx: The patient's family history includes CAD in his father. There is no history of Kidney disease or Diabetes Mellitus II.  ROS:   Please see the history of present illness.    Review of Systems  Constitutional: Positive for malaise/fatigue.  Respiratory: Negative.   Cardiovascular: Positive for chest pain.  Gastrointestinal: Negative.   Musculoskeletal: Negative.   Neurological: Negative.   Psychiatric/Behavioral: Negative.   All other systems reviewed and are negative.     Labs/Other Tests and Data Reviewed:    Recent Labs: 08/07/2018: B Natriuretic Peptide 15.0 10/21/2018: ALT 24; BUN 11; Creatinine, Ser 1.16; Hemoglobin 14.6; Platelets 217; Potassium 4.2; Sodium 141   Recent Lipid Panel Lab Results  Component Value Date/Time   CHOL 189 10/21/2018 11:44 AM   TRIG 120 10/21/2018 11:44 AM   HDL 45 10/21/2018 11:44 AM   CHOLHDL 4.2 10/21/2018 11:44 AM   CHOLHDL 5.4 02/03/2017 04:14 AM   LDLCALC 120 (H) 10/21/2018 11:44 AM    Wt Readings from Last 3 Encounters:  08/07/18 240 lb (108.9 kg)  07/14/18 249 lb (112.9 kg)   05/12/18 240 lb (108.9 kg)     Exam:    Vital Signs: Vital signs may also be detailed in the HPI There were no vitals taken for this visit.  Wt Readings from Last 3 Encounters:  08/07/18 240 lb (108.9 kg)  07/14/18 249 lb (112.9 kg)  05/12/18 240 lb (108.9 kg)   Temp Readings from Last 3 Encounters:  08/07/18 99 F (37.2 C) (Oral)  05/12/18 97.9 F (36.6 C) (Oral)  04/02/18 98.1 F (36.7 C)   BP Readings from Last 3 Encounters:  08/07/18 (!) 173/98  07/14/18 128/84  07/02/18 120/79   Pulse Readings from Last 3 Encounters:  08/07/18 (!) 102  07/14/18 69  07/02/18 69    120s over 80, pulse 70, respirations 16  Well nourished, well developed male in no acute distress. Constitutional:  oriented to person, place, and time. No distress.   ASSESSMENT & PLAN:    Atherosclerosis of native coronary artery of native heart with stable angina pectoris (HCC) Long history of coronary disease with stenting going back to 2012 Recent visit to the emergency room March 2020 with new left bundle branch block, tachycardia BNP was low, less likely CHF Concern for arrhythmia even atrial tachycardia Unclear if he was having tachycardia mediated left bundle Metoprolol was increased at that time 25 twice daily, still does not feel quite right Concern for ischemia given new left bundle branch block We will order a pharmacologic Myoview Continue current medications, add Zetia  Essential hypertension Blood pressure is well controlled on today's visit. No changes made to the medications.  Mixed hyperlipidemia LDL not at goal over the past several years, goal LDL less than 70 preferably 60 Unable to tolerate Lipitor, he is tolerating pravastatin We will add Zetia 10 mg daily May need PCSK9 inhibitor if goal LDL not less than 70  Smoker We have encouraged him to continue to work on weaning his cigarettes and smoking cessation. He will continue to work on this and does not want any  assistance with chantix.   COVID-19 Education: The signs and symptoms of COVID-19 were discussed with the patient and how to seek care for testing (follow up with PCP or arrange E-visit).  The importance of social distancing was discussed today.  Patient Risk:   After full review of this patients clinical status, I feel that they are at least moderate risk at  this time.  Time:   Today, I have spent 25 minutes with the patient with telehealth technology discussing the cardiac and medical problems/diagnoses detailed above   10 min spent reviewing the chart prior to patient visit today   Medication Adjustments/Labs and Tests Ordered: Current medicines are reviewed at length with the patient today.  Concerns regarding medicines are outlined above.   Tests Ordered: No tests ordered   Medication Changes: No changes made   Disposition: Follow-up in 6 months   Signed, Julien Nordmann, MD  11/03/2018 10:39 AM    [facility] [address], [place], [place] [address]

## 2018-11-03 ENCOUNTER — Other Ambulatory Visit: Payer: Self-pay

## 2018-11-03 ENCOUNTER — Telehealth (INDEPENDENT_AMBULATORY_CARE_PROVIDER_SITE_OTHER): Payer: Self-pay | Admitting: Cardiovascular Disease

## 2018-11-03 DIAGNOSIS — I447 Left bundle-branch block, unspecified: Secondary | ICD-10-CM

## 2018-11-03 DIAGNOSIS — R079 Chest pain, unspecified: Secondary | ICD-10-CM

## 2018-11-03 DIAGNOSIS — E782 Mixed hyperlipidemia: Secondary | ICD-10-CM

## 2018-11-03 DIAGNOSIS — I10 Essential (primary) hypertension: Secondary | ICD-10-CM

## 2018-11-03 DIAGNOSIS — F17200 Nicotine dependence, unspecified, uncomplicated: Secondary | ICD-10-CM

## 2018-11-03 DIAGNOSIS — I25118 Atherosclerotic heart disease of native coronary artery with other forms of angina pectoris: Secondary | ICD-10-CM

## 2018-11-03 MED ORDER — EZETIMIBE 10 MG PO TABS: 10.0000 mg | ORAL_TABLET | Freq: Every day | ORAL | 3 refills | Status: DC

## 2018-11-03 NOTE — Patient Instructions (Addendum)
Medication Instructions:  Your physician has recommended you make the following change in your medication:  1. START Ezetimibe (Zetia) 10 mg once daily   If you need a refill on your cardiac medications before your next appointment, please call your pharmacy.    Lab work: No new labs needed   If you have labs (blood work) drawn today and your tests are completely normal, you will receive your results only by: . MyChart Message (if you have MyChart) OR . A paper copy in the mail If you have any lab test that is abnormal or we need to change your treatment, we will call you to review the results.   Testing/ProceduMarland Kitchenres: We will order a pharmacological stress test For chest pain, new LBBB  [facility] MY[facility]IEW  Your caregiver has ordered a Stress Test with nuclear imaging. The purpose of this test is to evaluate the blood supply to your heart muscle. This procedure is referred to as a "Non-Invasive Stress Test." This is because other than having an IV started in your vein, nothing is inserted or "invades" your body. Cardiac stress tests are done to find areas of poor blood flow to the heart by determining the extent of coronary artery disease (CAD). Some patients exercise on a treadmill, which naturally increases the blood flow to your heart, while others who are  unable to walk on a treadmill due to physical limitations have a pharmacologic/chemical stress agent called Lexiscan . This medicine will mimic walking on a treadmill by temporarily increasing your coronary blood flow.   Please note: these test may take anywhere between 2-4 hours to complete  PLEASE REPORT TO [facility] ENTRANCE  THE VOLUNTEERS AT THE FIRST DESK WILL DIRECT YOU WHERE TO GO  Date of Procedure:_Friday June 12, 2020_  Arrival Time for Procedure:_07:45 AM_  Instructions regarding medication:   _XX___:  Hold Metoprolol the night before procedure and morning of procedure   PLEASE NOTIFY THE OFFICE AT LEAST 24  HOURS IN ADVANCE IF YOU ARE UNABLE TO KEEP YOUR APPOINTMENT.  [phone] AND  PLEASE NOTIFY NUCLEAR MEDICINE AT [facility] AT LEAST 24 HOURS IN ADVANCE IF YOU ARE UNABLE TO KEEP YOUR APPOINTMENT. [phone]  How to prepare for your Myoview test:  1. Do not eat or drink after midnight 2. No caffeine for 24 hours prior to test 3. No smoking 24 hours prior to test. 4. Your medication may be taken with water.  If your doctor stopped a medication because of this test, do not take that medication. 5. Ladies, please do not wear dresses.  Skirts or pants are appropriate. Please wear a short sleeve shirt. 6. No perfume, cologne or lotion. 7. Wear comfortable walking shoes. No heels!   Follow-Up: At [facility], you and your health needs are our priority.  As part of our continuing mission to provide you with exceptional heart care, we have created designated Provider Care Teams.  These Care Teams include your primary Cardiologist (physician) and Advanced Practice Providers (APPs -  Physician Assistants and Nurse Practitioners) who all work together to provide you with the care you need, when you need it.  . You will need a follow up appointment in 6 months .   Please call our office 2 months in advance to schedule this appointment.    . Providers on your designated Care Team:   . Nicolasa Duckinghristopher Berge, NP . Eula Listenyan Dunn, PA-C . Marisue IvanJacquelyn Visser, PA-C  Any Other Special Instructions Will Be Listed Below (If Applicable).  For educational health videos Log in to : [email or web address] Or : [email or web address], password : [email or web address]

## 2018-11-06 ENCOUNTER — Other Ambulatory Visit: Payer: Medicaid Other

## 2018-11-13 ENCOUNTER — Encounter
Admission: RE | Admit: 2018-11-13 | Discharge: 2018-11-13 | Disposition: A | Discharge status: Home / Self Care | Payer: Self-pay | Source: Ambulatory Visit | Attending: Cardiovascular Disease | Admitting: Cardiovascular Disease

## 2018-11-13 ENCOUNTER — Other Ambulatory Visit: Payer: Self-pay

## 2018-11-13 DIAGNOSIS — I447 Left bundle-branch block, unspecified: Secondary | ICD-10-CM | POA: Insufficient documentation

## 2018-11-13 DIAGNOSIS — R0789 Other chest pain: Secondary | ICD-10-CM | POA: Insufficient documentation

## 2018-11-13 DIAGNOSIS — R079 Chest pain, unspecified: Secondary | ICD-10-CM

## 2018-11-13 LAB — NM MYOCAR MULTI W/SPECT W/WALL MOTION / EF
LV dias vol: 106 mL (ref 62–150)
LV sys vol: 50 mL
Peak HR: 110 {beats}/min
Percent HR: 66 %
Rest HR: 73 {beats}/min
SDS: 0
SRS: 18
SSS: 0
TID: 1

## 2018-11-13 MED ORDER — TECHNETIUM TC 99M TETROFOSMIN IV KIT
31.6550 | PACK | Freq: Once | INTRAVENOUS | Status: AC | PRN
  Administered 2018-11-13: 31.655 via INTRAVENOUS

## 2018-11-13 MED ORDER — TECHNETIUM TC 99M TETROFOSMIN IV KIT
9.9390 | PACK | Freq: Once | INTRAVENOUS | Status: AC | PRN
  Administered 2018-11-13: 9.939 via INTRAVENOUS

## 2018-11-13 MED ORDER — REGADENOSON 0.4 MG/5ML IV SOLN
0.4000 mg | Freq: Once | INTRAVENOUS | Status: AC
  Administered 2018-11-13: 0.4 mg via INTRAVENOUS

## 2018-11-19 ENCOUNTER — Telehealth: Payer: Self-pay | Admitting: Cardiovascular Disease

## 2018-11-19 NOTE — Telephone Encounter (Signed)
Recieved request from :disability ssa  Scanned to roi Forwarded to [employer] for processing

## 2018-12-08 ENCOUNTER — Other Ambulatory Visit: Payer: Self-pay | Admitting: Gerontology

## 2018-12-08 DIAGNOSIS — I10 Essential (primary) hypertension: Secondary | ICD-10-CM

## 2019-01-06 ENCOUNTER — Other Ambulatory Visit: Payer: Self-pay

## 2019-01-06 ENCOUNTER — Ambulatory Visit: Payer: Medicaid Other | Admitting: Gerontology

## 2019-01-06 ENCOUNTER — Encounter: Payer: Self-pay | Admitting: Gerontology

## 2019-01-06 VITALS — Ht 68.0 in | Wt 244.0 lb

## 2019-01-06 DIAGNOSIS — I10 Essential (primary) hypertension: Secondary | ICD-10-CM

## 2019-01-06 DIAGNOSIS — R200 Anesthesia of skin: Secondary | ICD-10-CM

## 2019-01-06 DIAGNOSIS — Z8669 Personal history of other diseases of the nervous system and sense organs: Secondary | ICD-10-CM | POA: Insufficient documentation

## 2019-01-06 DIAGNOSIS — R351 Nocturia: Secondary | ICD-10-CM

## 2019-01-06 DIAGNOSIS — N529 Male erectile dysfunction, unspecified: Secondary | ICD-10-CM | POA: Insufficient documentation

## 2019-01-06 MED ORDER — EXCEDRIN MIGRAINE 250-250-65 MG PO TABS: 1.0000 | ORAL_TABLET | Freq: Every day | ORAL | 0 refills | Status: AC | PRN

## 2019-01-06 MED ORDER — GABAPENTIN 100 MG PO CAPS: 100.0000 mg | ORAL_CAPSULE | Freq: Three times a day (TID) | ORAL | 0 refills | Status: DC

## 2019-01-06 MED ORDER — METOPROLOL TARTRATE 25 MG PO TABS: ORAL_TABLET | ORAL | 0 refills | Status: DC

## 2019-01-06 NOTE — Patient Instructions (Signed)

## 2019-01-06 NOTE — Progress Notes (Signed)
Established Patient Office Visit  Subjective:  Patient ID: Bryce [name], male    DOB: 1966/05/01  Age: 53 y.o. MRN: [id number]  CC:  Chief Complaint  Patient presents with  . Follow-up    high blood pressure  Patient consents to telephone visit and 2 patient identifiers was used to identify patient.  HPI Bryce [name] presents for follow up of hypertension and headache. Currently, he reports worsening paresthesia to his right hand. He states that  paresthesia occurs daily, he experiences difficulty opening bottles, wakes up at night with numbness to hand. He states that numbness lasts between few to 30 minutes, shaking and rubbing his hand minimally relieves symptoms.  He reports having difficulty achieving and maintaining erection. He also c/o nocturia, voiding 4-5 times nightly. He experiences urinary frequency, urgency, weak urinary stream, but denies dysuria, hematuria and flank pain.   He reports being compliant with his medications, doesn't check his blood pressure at home, but adheres to [employer] and exercises as tolerated. He continues to experience intermittent migraine headache and taking Excedrin migraine relieves symptoms. He denies chest pain, palpitation, shortness of breath, fever, chills and no further concerns.      Past Medical History:  Diagnosis Date  . Chronic systolic congestive heart failure ([place])   . Coronary artery disease   . GERD (gastroesophageal reflux disease)   . Hypertension   . Ischemic cardiomyopathy   . MI (myocardial infarction) ([place])   . Migraine     Past Surgical History:  Procedure Laterality Date  . CARDIAC CATHETERIZATION N/A 12/28/2014   Procedure: Left Heart Cath and Coronary Angiography;  Surgeon: Yolonda Kida, MD;  Location: [place] CV LAB;  Service: Cardiovascular;  Laterality: N/A;  . CORONARY ANGIOPLASTY     Non-ST elevation MI status post stenting within bare-metal stent of the 75% lesion of the LAD, 11/13/2010.    . CORONARY STENT INTERVENTION N/A 02/02/2017   Procedure: CORONARY/GRAFT ACUTE MI REVASCULARIZATION;  Surgeon: Yolonda Kida, MD;  Location: [place] CV LAB;  Service: Cardiovascular;  Laterality: N/A;  . LEFT HEART CATH AND CORONARY ANGIOGRAPHY N/A 02/02/2017   Procedure: LEFT HEART CATH AND CORONARY ANGIOGRAPHY;  Surgeon: Yolonda Kida, MD;  Location: [place] CV LAB;  Service: Cardiovascular;  Laterality: N/A;    Family History  Problem Relation Age of Onset  . CAD Father   . Kidney disease Neg Hx   . Diabetes Mellitus II Neg Hx     Social History   Socioeconomic History  . Marital status: Single    Spouse name: Not on file  . Number of children: Not on file  . Years of education: Not on file  . Highest education level: Not on file  Occupational History  . Occupation: Proofreader  Social Needs  . Financial resource strain: Not on file  . Food insecurity    Worry: Not on file    Inability: Not on file  . Transportation needs    Medical: Not on file    Non-medical: Not on file  Tobacco Use  . Smoking status: Current Some Day Smoker    Packs/day: 1.00    Years: 5.00    Pack years: 5.00    Types: Cigarettes    Last attempt to quit: 01/05/2017    Years since quitting: 2.0  . Smokeless tobacco: Never Used  . Tobacco comment: states smokes occasionally 10-12-18  Substance and Sexual Activity  . Alcohol use: Not Currently  .  Drug use: Yes    Types: Marijuana  . Sexual activity: Yes  Lifestyle  . Physical activity    Days per week: Not on file    Minutes per session: Not on file  . Stress: Not on file  Relationships  . Social Herbalist on phone: Not on file    Gets together: Not on file    Attends religious service: Not on file    Active member of club or organization: Not on file    Attends meetings of clubs or organizations: Not on file    Relationship status: Not on file  . Intimate partner violence    Fear of current or ex  partner: Not on file    Emotionally abused: Not on file    Physically abused: Not on file    Forced sexual activity: Not on file  Other Topics Concern  . Not on file  Social History Narrative  . Not on file    Outpatient Medications Prior to Visit  Medication Sig Dispense Refill  . aspirin EC 81 MG EC tablet Take 1 tablet (81 mg total) by mouth daily. 30 tablet 0  . clopidogrel (PLAVIX) 75 MG tablet TAKE ONE TABLET BY MOUTH EVERY DAY 90 tablet 0  . Docosanol (ABREVA) 10 % CREA Apply 1 application topically 2 (two) times daily. 1 Tube 0  . ezetimibe (ZETIA) 10 MG tablet Take 1 tablet (10 mg total) by mouth daily. 90 tablet 3  . lisinopril (PRINIVIL,ZESTRIL) 20 MG tablet TAKE ONE TABLET BY MOUTH EVERY DAY 90 tablet 0  . meloxicam (MOBIC) 15 MG tablet TAKE ONE TABLET BY MOUTH EVERY DAY AS NEEDED FOR PAIN 30 tablet 0  . nitroGLYCERIN (NITROSTAT) 0.4 MG SL tablet Place 1 tablet (0.4 mg total) under the tongue every 5 (five) minutes x 3 doses as needed for chest pain. 30 tablet 0  . pantoprazole (PROTONIX) 40 MG tablet Take 1 tablet (40 mg total) by mouth daily. 30 tablet 0  . pravastatin (PRAVACHOL) 40 MG tablet Take 1 tablet (40 mg total) by mouth daily. 90 tablet 3  . aspirin-acetaminophen-caffeine (EXCEDRIN MIGRAINE) 250-250-65 MG tablet Take 1 tablet by mouth daily as needed for headache. 30 tablet 0  . metoprolol tartrate (LOPRESSOR) 25 MG tablet TAKE ONE TABLET BY MOUTH 2 TIMES A DAY 60 tablet 0   No facility-administered medications prior to visit.     Allergies  Allergen Reactions  . Atorvastatin     Myalgias    ROS Review of Systems  Constitutional: Negative.   Respiratory: Negative.   Cardiovascular: Negative.   Genitourinary: Positive for frequency and urgency. Negative for dysuria and flank pain.  Musculoskeletal: Positive for arthralgias.  Neurological: Positive for numbness and headaches.  Psychiatric/Behavioral: Negative.       Objective:    Physical Exam No  vital sign or PE was done. Ht _0  (1.727 m)   Wt 244 lb (110.7 kg) Comment: pt reported  BMI 37.10 kg/m  Wt Readings from Last 3 Encounters:  01/06/19 244 lb (110.7 kg)  08/07/18 240 lb (108.9 kg)  07/14/18 249 lb (112.9 kg)   He was encouraged to continue on weight loss regimen.  Health Maintenance Due  Topic Date Due  . TETANUS/TDAP  10/11/1983  . COLONOSCOPY  10/11/2014  . INFLUENZA VACCINE  01/02/2019    There are no preventive care reminders to display for this patient.  Lab Results  Component Value Date   TSH 1.624 12/22/2014  Lab Results  Component Value Date   WBC 4.8 10/21/2018   HGB 14.6 10/21/2018   HCT 42.8 10/21/2018   MCV 93 10/21/2018   PLT 217 10/21/2018   Lab Results  Component Value Date   NA 141 10/21/2018   K 4.2 10/21/2018   CO2 21 10/21/2018   GLUCOSE 127 (H) 10/21/2018   BUN 11 10/21/2018   CREATININE 1.16 10/21/2018   BILITOT 0.3 10/21/2018   ALKPHOS 61 10/21/2018   AST 17 10/21/2018   ALT 24 10/21/2018   PROT 6.6 10/21/2018   ALBUMIN 4.4 10/21/2018   CALCIUM 9.0 10/21/2018   ANIONGAP 12 08/07/2018   Lab Results  Component Value Date   CHOL 189 10/21/2018   Lab Results  Component Value Date   HDL 45 10/21/2018   Lab Results  Component Value Date   LDLCALC 120 (H) 10/21/2018   Lab Results  Component Value Date   TRIG 120 10/21/2018   Lab Results  Component Value Date   CHOLHDL 4.2 10/21/2018   Lab Results  Component Value Date   HGBA1C 5.6 10/21/2018      Assessment & Plan:     1. Essential hypertension - He will be provided with blood pressure kit, advised to check and record blood pressure and bring log to office visit. He will continue on current treatment regimen and was advised to continue on DASH diet, exercise as tolerated. - metoprolol tartrate (LOPRESSOR) 25 MG tablet; TAKE ONE TABLET BY MOUTH 2 TIMES A DAY  Dispense: 60 tablet; Refill: 0  2. Erectile dysfunction, unspecified erectile dysfunction  type - PSA will be checked to rule out BPH. - PSA; Future - Testosterone; Future  3. Numbness - He will start gabapentin, advised on medication side effect. Will monitor medication outcome and might consider Neurology referral if no improvement. - gabapentin (NEURONTIN) 100 MG capsule; Take 1 capsule (100 mg total) by mouth 3 (three) times daily.  Dispense: 30 capsule; Refill: 0 - B12 and Folate Panel; Future  4. History of migraine headaches - He will continue on current treatment regimen. - aspirin-acetaminophen-caffeine (EXCEDRIN MIGRAINE) 250-250-65 MG tablet; Take 1 tablet by mouth daily as needed for headache.  Dispense: 30 tablet; Refill: 0  5. Nocturia - Urinalysis and culture will be done to rule out UTI. He was advised to limit water intake at night. HgbA1c will be checked to rule out diabetes. - Urinalysis; Future - PSA; Future - HgB A1c; Future   Follow-up: Return in about 20 days (around 01/26/2019), or if symptoms worsen or fail to improve.    Jovan Colligan Jerold Coombe, NP

## 2019-01-20 ENCOUNTER — Other Ambulatory Visit: Payer: Medicaid Other

## 2019-01-26 ENCOUNTER — Ambulatory Visit: Payer: Medicaid Other | Admitting: Gerontology

## 2019-01-27 ENCOUNTER — Other Ambulatory Visit: Payer: Medicaid Other

## 2019-01-27 ENCOUNTER — Other Ambulatory Visit: Payer: Self-pay

## 2019-01-27 DIAGNOSIS — N529 Male erectile dysfunction, unspecified: Secondary | ICD-10-CM

## 2019-01-27 DIAGNOSIS — R200 Anesthesia of skin: Secondary | ICD-10-CM

## 2019-01-27 DIAGNOSIS — R351 Nocturia: Secondary | ICD-10-CM

## 2019-01-28 ENCOUNTER — Ambulatory Visit: Payer: Medicaid Other | Admitting: Gerontology

## 2019-01-28 VITALS — BP 116/68 | HR 93 | Temp 97.3°F | Ht 68.0 in | Wt 244.1 lb

## 2019-01-28 DIAGNOSIS — R200 Anesthesia of skin: Secondary | ICD-10-CM

## 2019-01-28 DIAGNOSIS — N529 Male erectile dysfunction, unspecified: Secondary | ICD-10-CM

## 2019-01-28 DIAGNOSIS — R7303 Prediabetes: Secondary | ICD-10-CM | POA: Insufficient documentation

## 2019-01-28 DIAGNOSIS — Z8719 Personal history of other diseases of the digestive system: Secondary | ICD-10-CM

## 2019-01-28 LAB — HEMOGLOBIN A1C
Est. average glucose Bld gHb Est-mCnc: 120 mg/dL
Hgb A1c MFr Bld: 5.8 % — ABNORMAL HIGH (ref 4.8–5.6)

## 2019-01-28 LAB — B12 AND FOLATE PANEL
Folate: 8.7 ng/mL (ref >3.0)
Vitamin B-12: 220 pg/mL — ABNORMAL LOW (ref 232–1245)

## 2019-01-28 LAB — PSA: Prostate Specific Ag, Serum: 0.8 ng/mL (ref 0.0–4.0)

## 2019-01-28 LAB — TESTOSTERONE: Testosterone: 570 ng/dL (ref 264–916)

## 2019-01-28 MED ORDER — PANTOPRAZOLE SODIUM 40 MG PO TBEC: 40.0000 mg | DELAYED_RELEASE_TABLET | Freq: Every day | ORAL | 0 refills | Status: DC

## 2019-01-28 MED ORDER — GABAPENTIN 100 MG PO CAPS: 100.0000 mg | ORAL_CAPSULE | Freq: Three times a day (TID) | ORAL | 0 refills | Status: DC

## 2019-01-28 NOTE — Progress Notes (Signed)
Established Patient Office Visit  Subjective:  Patient ID: Bryce BanRodney W Ortego, male    DOB: 1965/08/03  Age: 53 y.o. MRN: [id number]  CC:  Chief Complaint  Patient presents with  . Hand Pain    HPI Bryce [name] presents for follow up of paresthesia to right hand, erectile dysfunction, lab review and medication refill. He reports that he continues to experience worsening tingling non radiating 7/10 pain and numbness to his right hand. He states that he's being taking gabapentin 100 mg daily instead of tid with no relief. Also he reports that he continues to experience difficulty achieving or maintaining erection. His testosterone and PSA done yesterday were normal. He denies penile or scrotal pain, erythema or penile discharge. His HgbA1c was 5.8% and he states that he consumes large quantities of candies daily. He states that he's doing well, denies chest pain, palpitation, light headedness, fever, chills and no further concerns.   Past Medical History:  Diagnosis Date  . Chronic systolic congestive heart failure (HCC)   . Coronary artery disease   . GERD (gastroesophageal reflux disease)   . Hypertension   . Ischemic cardiomyopathy   . MI (myocardial infarction) (HCC)   . Migraine     Past Surgical History:  Procedure Laterality Date  . CARDIAC CATHETERIZATION N/A 12/28/2014   Procedure: Left Heart Cath and Coronary Angiography;  Surgeon: Alwyn Peawayne D Callwood, MD;  Location: [facility];  Service: Cardiovascular;  Laterality: N/A;  . CORONARY ANGIOPLASTY     Non-ST elevation MI status post stenting within bare-metal stent of the 75% lesion of the LAD, 11/13/2010.   . CORONARY STENT INTERVENTION N/A 02/02/2017   Procedure: CORONARY/GRAFT ACUTE MI REVASCULARIZATION;  Surgeon: Alwyn Peaallwood, Dwayne D, MD;  Location: [facility];  Service: Cardiovascular;  Laterality: N/A;  . LEFT HEART CATH AND CORONARY ANGIOGRAPHY N/A 02/02/2017   Procedure: LEFT HEART CATH AND CORONARY  ANGIOGRAPHY;  Surgeon: Alwyn Peaallwood, Dwayne D, MD;  Location: [facility];  Service: Cardiovascular;  Laterality: N/A;    Family History  Problem Relation Age of Onset  . CAD Father   . Kidney disease Neg Hx   . Diabetes Mellitus II Neg Hx     Social History   Socioeconomic History  . Marital status: Single    Spouse name: Not on file  . Number of children: Not on file  . Years of education: Not on file  . Highest education level: Not on file  Occupational History  . 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 Financial resource strain: Not on file  . Food insecurity    Worry: Not on file    Inability: Not on file  . Transportation needs    Medical: Not on file    Non-medical: Not on file  Tobacco Use  . Smoking status: Current Some Day Smoker    Packs/day: 1.00    Years: 5.00    Pack years: 5.00    Types: Cigarettes    Last attempt to quit: 01/05/2017    Years since quitting: 2.0  . Smokeless tobacco: Never Used  . Tobacco comment: states smokes occasionally 10-12-18  Substance and Sexual Activity  . Alcohol use: Not Currently  . Drug use: Yes    Types: Marijuana  . Sexual activity: Yes  Lifestyle  . Physical activity    Days per week: Not on file    Minutes per session: Not on file  . Stress: Not on file  Relationships  . Social Herbalist on phone: Not on file    Gets together: Not on file    Attends religious service: Not on file    Active member of club or organization: Not on file    Attends meetings of clubs or organizations: Not on file    Relationship status: Not on file  . Intimate partner violence    Fear of current or ex partner: Not on file    Emotionally abused: Not on file    Physically abused: Not on file    Forced sexual activity: Not on file  Other Topics Concern  . Not on file  Social History Narrative  . Not on file    Outpatient Medications Prior to Visit  Medication Sig Dispense Refill  . aspirin EC 81 MG EC  tablet Take 1 tablet (81 mg total) by mouth daily. 30 tablet 0  . aspirin-acetaminophen-caffeine (EXCEDRIN MIGRAINE) 250-250-65 MG tablet Take 1 tablet by mouth daily as needed for headache. 30 tablet 0  . clopidogrel (PLAVIX) 75 MG tablet TAKE ONE TABLET BY MOUTH EVERY DAY 90 tablet 0  . Docosanol (ABREVA) 10 % CREA Apply 1 application topically 2 (two) times daily. 1 Tube 0  . ezetimibe (ZETIA) 10 MG tablet Take 1 tablet (10 mg total) by mouth daily. 90 tablet 3  . lisinopril (PRINIVIL,ZESTRIL) 20 MG tablet TAKE ONE TABLET BY MOUTH EVERY DAY 90 tablet 0  . meloxicam (MOBIC) 15 MG tablet TAKE ONE TABLET BY MOUTH EVERY DAY AS NEEDED FOR PAIN 30 tablet 0  . metoprolol tartrate (LOPRESSOR) 25 MG tablet TAKE ONE TABLET BY MOUTH 2 TIMES A DAY 60 tablet 0  . nitroGLYCERIN (NITROSTAT) 0.4 MG SL tablet Place 1 tablet (0.4 mg total) under the tongue every 5 (five) minutes x 3 doses as needed for chest pain. 30 tablet 0  . pravastatin (PRAVACHOL) 40 MG tablet Take 1 tablet (40 mg total) by mouth daily. 90 tablet 3  . gabapentin (NEURONTIN) 100 MG capsule Take 1 capsule (100 mg total) by mouth 3 (three) times daily. 30 capsule 0  . pantoprazole (PROTONIX) 40 MG tablet Take 1 tablet (40 mg total) by mouth daily. 30 tablet 0   No facility-administered medications prior to visit.     Allergies  Allergen Reactions  . Atorvastatin     Myalgias    ROS Review of Systems  Constitutional: Negative.   Respiratory: Negative.   Cardiovascular: Negative.   Endocrine: Negative.   Genitourinary: Negative.   Skin: Negative.   Neurological: Positive for numbness (chronic numbness to right hand).  Psychiatric/Behavioral: Negative.       Objective:    Physical Exam  Constitutional: He is oriented to person, place, and time. He appears well-developed and well-nourished.  HENT:  Head: Normocephalic and atraumatic.  Eyes: Pupils are equal, round, and reactive to light. EOM are normal.  Neck: Normal range  of motion. Neck supple.  Cardiovascular: Normal rate and regular rhythm.  Pulmonary/Chest: Effort normal and breath sounds normal.  Abdominal: Soft. Bowel sounds are normal. Distention: central obesity.  Musculoskeletal: Normal range of motion.  Neurological: He is alert and oriented to person, place, and time. He has normal reflexes.  Skin: Skin is warm and dry.  Psychiatric: He has a normal mood and affect. His behavior is normal. Judgment and thought content normal.    BP 116/68 (BP Location: Right Arm, Patient Position: Sitting)   Pulse 93   Temp (!) 97.3 F (  36.3 C)   Ht 5\' 8"  (1.727 m)   Wt 244 lb 1.6 oz (110.7 kg)   SpO2 98%   BMI 37.12 kg/m  Wt Readings from Last 3 Encounters:  01/28/19 244 lb 1.6 oz (110.7 kg)  01/06/19 244 lb (110.7 kg)  08/07/18 240 lb (108.9 kg)   He was encouraged to continue on weight loss regimen.  Health Maintenance Due  Topic Date Due  . TETANUS/TDAP  10/11/1983  . COLONOSCOPY  10/11/2014  . INFLUENZA VACCINE  01/02/2019    There are no preventive care reminders to display for this patient.  Lab Results  Component Value Date   TSH 1.624 12/22/2014   Lab Results  Component Value Date   WBC 4.8 10/21/2018   HGB 14.6 10/21/2018   HCT 42.8 10/21/2018   MCV 93 10/21/2018   PLT 217 10/21/2018   Lab Results  Component Value Date   NA 141 10/21/2018   K 4.2 10/21/2018   CO2 21 10/21/2018   GLUCOSE 127 (H) 10/21/2018   BUN 11 10/21/2018   CREATININE 1.16 10/21/2018   BILITOT 0.3 10/21/2018   ALKPHOS 61 10/21/2018   AST 17 10/21/2018   ALT 24 10/21/2018   PROT 6.6 10/21/2018   ALBUMIN 4.4 10/21/2018   CALCIUM 9.0 10/21/2018   ANIONGAP 12 08/07/2018   Lab Results  Component Value Date   CHOL 189 10/21/2018   Lab Results  Component Value Date   HDL 45 10/21/2018   Lab Results  Component Value Date   LDLCALC 120 (H) 10/21/2018   Lab Results  Component Value Date   TRIG 120 10/21/2018   Lab Results  Component Value  Date   CHOLHDL 4.2 10/21/2018   Lab Results  Component Value Date   HGBA1C 5.8 (H) 01/27/2019      Assessment & Plan:     1. Numbness - He was encouraged to take medication as ordered, educated on medication side effects and to notify clinic. - gabapentin (NEURONTIN) 100 MG capsule; Take 1 capsule (100 mg total) by mouth 3 (three) times daily.  Dispense: 90 capsule; Refill: 0  2. H/O gastroesophageal reflux (GERD) - He will continue on current treatment regimen. -Avoid spicy, fatty and fried food -Avoid sodas and sour juices -Avoid heavy meals -Avoid eating 4 hours before bedtime -Elevate head of bed at night - pantoprazole (PROTONIX) 40 MG tablet; Take 1 tablet (40 mg total) by mouth daily.  Dispense: 30 tablet; Refill: 0  3. Erectile dysfunction, unspecified erectile dysfunction type - He was encouraged to complete charity care application for - Ambulatory referral to Urology  4. Prediabetes - His HgbA1c was 5.8%, he defers to take metformin, and states that he will make dietry changes. Will recheck HgbA1c in 3 months.   Follow-up: Return in about 1 month (around 02/28/2019), or if symptoms worsen or fail to improve.    Neve Branscomb Trellis Paganini, NP

## 2019-01-28 NOTE — Patient Instructions (Signed)
Carbohydrate Counting for Diabetes Mellitus, Adult  Carbohydrate counting is a method of keeping track of how many carbohydrates you eat. Eating carbohydrates naturally increases the amount of sugar (glucose) in the blood. Counting how many carbohydrates you eat helps keep your blood glucose within normal limits, which helps you manage your diabetes (diabetes mellitus). It is important to know how many carbohydrates you can safely have in each meal. This is different for every person. A diet and nutrition specialist (registered dietitian) can help you make a meal plan and calculate how many carbohydrates you should have at each meal and snack. Carbohydrates are found in the following foods:  Grains, such as breads and cereals.  Dried beans and soy products.  Starchy vegetables, such as potatoes, peas, and corn.  Fruit and fruit juices.  Milk and yogurt.  Sweets and snack foods, such as cake, cookies, candy, chips, and soft drinks. How do I count carbohydrates? There are two ways to count carbohydrates in food. You can use either of the methods or a combination of both. Reading "Nutrition Facts" on packaged food The "Nutrition Facts" list is included on the labels of almost all packaged foods and beverages in the [place] It includes:  The serving size.  Information about nutrients in each serving, including the grams (g) of carbohydrate per serving. To use the "Nutrition Facts":  Decide how many servings you will have.  Multiply the number of servings by the number of carbohydrates per serving.  The resulting number is the total amount of carbohydrates that you will be having. Learning standard serving sizes of other foods When you eat carbohydrate foods that are not packaged or do not include "Nutrition Facts" on the label, you need to measure the servings in order to count the amount of carbohydrates:  Measure the foods that you will eat with a food scale or measuring cup, if needed.   Decide how many standard-size servings you will eat.  Multiply the number of servings by 15. Most carbohydrate-rich foods have about 15 g of carbohydrates per serving. ? For example, if you eat 8 oz (170 g) of strawberries, you will have eaten 2 servings and 30 g of carbohydrates (2 servings x 15 g = 30 g).  For foods that have more than one food mixed, such as soups and casseroles, you must count the carbohydrates in each food that is included. The following list contains standard serving sizes of common carbohydrate-rich foods. Each of these servings has about 15 g of carbohydrates:   hamburger bun or  English muffin.   oz (15 mL) syrup.   oz (14 g) jelly.  1 slice of bread.  1 six-inch tortilla.  3 oz (85 g) cooked rice or pasta.  4 oz (113 g) cooked dried beans.  4 oz (113 g) starchy vegetable, such as peas, corn, or potatoes.  4 oz (113 g) hot cereal.  4 oz (113 g) mashed potatoes or  of a large baked potato.  4 oz (113 g) canned or frozen fruit.  4 oz (120 mL) fruit juice.  4-6 crackers.  6 chicken nuggets.  6 oz (170 g) unsweetened dry cereal.  6 oz (170 g) plain fat-free yogurt or yogurt sweetened with artificial sweeteners.  8 oz (240 mL) milk.  8 oz (170 g) fresh fruit or one small piece of fruit.  24 oz (680 g) popped popcorn. Example of carbohydrate counting Sample meal  3 oz (85 g) chicken breast.  6 oz (170 g)   brown rice.  4 oz (113 g) corn.  8 oz (240 mL) milk.  8 oz (170 g) strawberries with sugar-free whipped topping. Carbohydrate calculation 1. Identify the foods that contain carbohydrates: ? Rice. ? Corn. ? Milk. ? Strawberries. 2. Calculate how many servings you have of each food: ? 2 servings rice. ? 1 serving corn. ? 1 serving milk. ? 1 serving strawberries. 3. Multiply each number of servings by 15 g: ? 2 servings rice x 15 g = 30 g. ? 1 serving corn x 15 g = 15 g. ? 1 serving milk x 15 g = 15 g. ? 1 serving  strawberries x 15 g = 15 g. 4. Add together all of the amounts to find the total grams of carbohydrates eaten: ? 30 g + 15 g + 15 g + 15 g = 75 g of carbohydrates total. Summary  Carbohydrate counting is a method of keeping track of how many carbohydrates you eat.  Eating carbohydrates naturally increases the amount of sugar (glucose) in the blood.  Counting how many carbohydrates you eat helps keep your blood glucose within normal limits, which helps you manage your diabetes.  A diet and nutrition specialist (registered dietitian) can help you make a meal plan and calculate how many carbohydrates you should have at each meal and snack. This information is not intended to replace advice given to you by your health care provider. Make sure you discuss any questions you have with your health care provider. Document Released: 05/20/2005 Document Revised: 12/12/2016 Document Reviewed: 11/01/2015 Elsevier Patient Education  2020 Elsevier Inc.  

## 2019-02-12 ENCOUNTER — Other Ambulatory Visit: Payer: Self-pay | Admitting: Gerontology

## 2019-02-12 DIAGNOSIS — M5136 Other intervertebral disc degeneration, lumbar region: Secondary | ICD-10-CM

## 2019-02-12 DIAGNOSIS — M51369 Other intervertebral disc degeneration, lumbar region without mention of lumbar back pain or lower extremity pain: Secondary | ICD-10-CM

## 2019-02-16 ENCOUNTER — Other Ambulatory Visit: Payer: Self-pay | Admitting: Gerontology

## 2019-02-16 DIAGNOSIS — M5136 Other intervertebral disc degeneration, lumbar region: Secondary | ICD-10-CM

## 2019-02-18 ENCOUNTER — Ambulatory Visit: Payer: Medicaid Other | Admitting: Gerontology

## 2019-02-23 ENCOUNTER — Encounter: Payer: Self-pay | Admitting: Emergency Medicine

## 2019-02-23 ENCOUNTER — Other Ambulatory Visit: Payer: Self-pay

## 2019-02-23 ENCOUNTER — Emergency Department
Admission: EM | Admit: 2019-02-23 | Discharge: 2019-02-23 | Disposition: A | Discharge status: Home / Self Care | Payer: Medicaid Other | Attending: Emergency Medicine | Admitting: Emergency Medicine

## 2019-02-23 DIAGNOSIS — Z7982 Long term (current) use of aspirin: Secondary | ICD-10-CM | POA: Insufficient documentation

## 2019-02-23 DIAGNOSIS — I110 Hypertensive heart disease with heart failure: Secondary | ICD-10-CM | POA: Insufficient documentation

## 2019-02-23 DIAGNOSIS — Z7902 Long term (current) use of antithrombotics/antiplatelets: Secondary | ICD-10-CM | POA: Insufficient documentation

## 2019-02-23 DIAGNOSIS — Z79899 Other long term (current) drug therapy: Secondary | ICD-10-CM | POA: Insufficient documentation

## 2019-02-23 DIAGNOSIS — Z951 Presence of aortocoronary bypass graft: Secondary | ICD-10-CM | POA: Insufficient documentation

## 2019-02-23 DIAGNOSIS — G8929 Other chronic pain: Secondary | ICD-10-CM

## 2019-02-23 DIAGNOSIS — F17210 Nicotine dependence, cigarettes, uncomplicated: Secondary | ICD-10-CM | POA: Insufficient documentation

## 2019-02-23 DIAGNOSIS — I259 Chronic ischemic heart disease, unspecified: Secondary | ICD-10-CM | POA: Insufficient documentation

## 2019-02-23 DIAGNOSIS — I5022 Chronic systolic (congestive) heart failure: Secondary | ICD-10-CM | POA: Insufficient documentation

## 2019-02-23 DIAGNOSIS — M5440 Lumbago with sciatica, unspecified side: Secondary | ICD-10-CM | POA: Insufficient documentation

## 2019-02-23 MED ORDER — METHYLPREDNISOLONE 4 MG PO TBPK: ORAL_TABLET | ORAL | 0 refills | Status: DC

## 2019-02-23 MED ORDER — KETOROLAC TROMETHAMINE 60 MG/2ML IM SOLN
60.0000 mg | Freq: Once | INTRAMUSCULAR | Status: AC
  Administered 2019-02-23: 60 mg via INTRAMUSCULAR
  Filled 2019-02-23: qty 2

## 2019-02-23 MED ORDER — ORPHENADRINE CITRATE 30 MG/ML IJ SOLN
60.0000 mg | Freq: Two times a day (BID) | INTRAMUSCULAR | Status: DC
  Administered 2019-02-23: 60 mg via INTRAMUSCULAR
  Filled 2019-02-23: qty 2

## 2019-02-23 MED ORDER — HYDROMORPHONE HCL 1 MG/ML IJ SOLN
1.0000 mg | Freq: Once | INTRAMUSCULAR | Status: AC
  Administered 2019-02-23: 1 mg via INTRAMUSCULAR
  Filled 2019-02-23: qty 1

## 2019-02-23 MED ORDER — OXYCODONE-ACETAMINOPHEN 7.5-325 MG PO TABS: 1.0000 | ORAL_TABLET | Freq: Four times a day (QID) | ORAL | 0 refills | Status: AC | PRN

## 2019-02-23 MED ORDER — CYCLOBENZAPRINE HCL 10 MG PO TABS: 10.0000 mg | ORAL_TABLET | Freq: Three times a day (TID) | ORAL | 0 refills | Status: DC | PRN

## 2019-02-23 NOTE — ED Notes (Signed)
See triage note  Presents with lower back pain which is moving into right leg  States he was seen by MD on Saturday  Was told to jump up and down on left leg  States developed pain after  Unable to bear full wt d/t pain

## 2019-02-23 NOTE — ED Triage Notes (Signed)
Pain from right buttock down right leg for 2 days.  No injury

## 2019-02-23 NOTE — Discharge Instructions (Addendum)
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  No strenuous activities until reevaluation by PCP.

## 2019-02-23 NOTE — ED Provider Notes (Signed)
[facility] Emergency Department Provider Note   ____________________________________________   First MD Initiated Contact with Patient 02/23/19 [phone]     (approximate)  I have reviewed the triage vital signs and the nursing notes.   HISTORY  Chief Complaint Back Pain    HPI Bryce [name] is a 53 y.o. male patient complain of back pain for the component to the right buttocks for 2 days.  Patient onset of complaint was while he was taking a disability test 2 days ago.  Patient was told to alternate hopping as part of the evaluation.  Patient state he told the doctor that he has degenerative disc disease in his back and cannot perform the procedure.  Patient stated was necessary in order to complete evaluation.  Patient say after doing the procedure he went home and start experiencing back pain which is worse in the past 2 days.  Patient denies bowel bladder dysfunction.  Reviewed patient MRI which shows moderate degenerative disc disease from study done last year.  Patient rates his pain as a 10/10.  Patient described pain is "achy".  No relief taking "Goody powders".  Patient described the pain as "sharp".         Past Medical History:  Diagnosis Date   Chronic systolic congestive heart failure (HCC)    Coronary artery disease    GERD (gastroesophageal reflux disease)    Hypertension    Ischemic cardiomyopathy    MI (myocardial infarction) (HCC)    Migraine     Patient Active Problem List   Diagnosis Date Noted   Prediabetes 01/28/2019   Erectile dysfunction 01/06/2019   Numbness 01/06/2019   History of migraine headaches 01/06/2019   Atherosclerosis of native coronary artery of native heart with stable angina pectoris (HCC) 11/02/2018   Acute renal failure (ARF) (HCC) 01/13/2018   CAD (coronary artery disease) 02/20/2017   Dehydration 02/20/2017   STEMI (ST elevation myocardial infarction) (HCC) 02/02/2017   AKI (acute  kidney injury) (HCC) 12/15/2015   Solitary pulmonary nodule 12/23/2014   Chest pain 12/22/2014   Hypertension 12/22/2014   Hyperlipidemia 12/22/2014    Past Surgical History:  Procedure Laterality Date   CARDIAC CATHETERIZATION N/A 12/28/2014   Procedure: Left Heart Cath and Coronary Angiography;  Surgeon: Alwyn Pea, MD;  Location: [facility];  Service: Cardiovascular;  Laterality: N/A;   CORONARY ANGIOPLASTY     Non-ST elevation MI status post stenting within bare-metal stent of the 75% lesion of the LAD, 11/13/2010.    CORONARY STENT INTERVENTION N/A 02/02/2017   Procedure: CORONARY/GRAFT ACUTE MI REVASCULARIZATION;  Surgeon: Alwyn Pea, MD;  Location: [facility];  Service: Cardiovascular;  Laterality: N/A;   LEFT HEART CATH AND CORONARY ANGIOGRAPHY N/A 02/02/2017   Procedure: LEFT HEART CATH AND CORONARY ANGIOGRAPHY;  Surgeon: Alwyn Pea, MD;  Location: [facility];  Service: Cardiovascular;  Laterality: N/A;    Prior to Admission medications   Medication Sig Start Date End Date Taking? Authorizing Provider  aspirin EC 81 MG EC tablet Take 1 tablet (81 mg total) by mouth daily. 02/05/17   Katha Hamming, MD  aspirin-acetaminophen-caffeine (EXCEDRIN MIGRAINE) [phone] MG tablet Take 1 tablet by mouth daily as needed for headache. 01/06/19   Iloabachie, Chioma E, NP  clopidogrel (PLAVIX) 75 MG tablet TAKE ONE TABLET BY MOUTH EVERY DAY 09/17/18   Iloabachie, Chioma E, NP  cyclobenzaprine (FLEXERIL) 10 MG tablet Take 1 tablet (10 mg total) by mouth  3 (three) times daily as needed. 02/23/19   Sable Feil, PA-C  Docosanol (ABREVA) 10 % CREA Apply 1 application topically 2 (two) times daily. 01/15/18   Dustin Flock, MD  ezetimibe (ZETIA) 10 MG tablet Take 1 tablet (10 mg total) by mouth daily. 11/03/18 02/01/19  Minna Merritts, MD  gabapentin (NEURONTIN) 100 MG capsule Take 1 capsule (100 mg total) by mouth 3 (three) times daily.  01/28/19   Iloabachie, Chioma E, NP  lisinopril (PRINIVIL,ZESTRIL) 20 MG tablet TAKE ONE TABLET BY MOUTH EVERY DAY 09/17/18   Iloabachie, Chioma E, NP  meloxicam (MOBIC) 15 MG tablet TAKE ONE TABLET BY MOUTH EVERY DAY AS NEEDED FOR PAIN 02/16/19   Iloabachie, Chioma E, NP  methylPREDNISolone (MEDROL DOSEPAK) 4 MG TBPK tablet Take Tapered dose as directed 02/23/19   Sable Feil, PA-C  metoprolol tartrate (LOPRESSOR) 25 MG tablet TAKE ONE TABLET BY MOUTH 2 TIMES A DAY 01/06/19   Iloabachie, Chioma E, NP  nitroGLYCERIN (NITROSTAT) 0.4 MG SL tablet Place 1 tablet (0.4 mg total) under the tongue every 5 (five) minutes x 3 doses as needed for chest pain. 02/04/17   Epifanio Lesches, MD  oxyCODONE-acetaminophen (PERCOCET) 7.5-325 MG tablet Take 1 tablet by mouth every 6 (six) hours as needed for up to 5 days. 02/23/19 02/28/19  Sable Feil, PA-C  pantoprazole (PROTONIX) 40 MG tablet Take 1 tablet (40 mg total) by mouth daily. 01/28/19   Iloabachie, Chioma E, NP  pravastatin (PRAVACHOL) 40 MG tablet Take 1 tablet (40 mg total) by mouth daily. 04/02/18   Iloabachie, Chioma E, NP    Allergies Atorvastatin  Family History  Problem Relation Age of Onset   CAD Father    Kidney disease Neg Hx    Diabetes Mellitus II Neg Hx     Social History Social History   Tobacco Use   Smoking status: Current Some Day Smoker    Packs/day: 1.00    Years: 5.00    Pack years: 5.00    Types: Cigarettes    Last attempt to quit: 01/05/2017    Years since quitting: 2.1   Smokeless tobacco: Never Used   Tobacco comment: states smokes occasionally 10-12-18  Substance Use Topics   Alcohol use: Not Currently   Drug use: Yes    Types: Marijuana    Review of Systems Constitutional: No fever/chills Eyes: No visual changes. ENT: No sore throat. Cardiovascular: Denies chest pain. Respiratory: Denies shortness of breath. Gastrointestinal: No abdominal pain.  No nausea, no vomiting.  No diarrhea.  No  constipation. Genitourinary: Negative for dysuria. Musculoskeletal: Positive for back pain. Skin: Negative for rash. Neurological: Negative for headaches, focal weakness or numbness. Endocrine: Hypertension  Prediabetic Allergic/Immunilogical: Atorvastatin ____________________________________________   PHYSICAL EXAM:  VITAL SIGNS: ED Triage Vitals  Enc Vitals Group     BP 02/23/19 0839 (!) 165/118     Pulse Rate 02/23/19 0839 88     Resp 02/23/19 0839 18     Temp 02/23/19 0839 98.3 F (36.8 C)     Temp Source 02/23/19 0839 Oral     SpO2 02/23/19 0839 99 %     Weight 02/23/19 0833 245 lb (111.1 kg)     Height 02/23/19 0833 5\' 8"  (1.727 m)     Head Circumference --      Peak Flow --      Pain Score 02/23/19 0833 10     Pain Loc --      Pain Edu? --  Excl. in GC? --     Constitutional: Alert and oriented.  Moderate distress.  Morbid obesity. Neck: No cervical spine tenderness to palpation. Cardiovascular: Normal rate, regular rhythm. Grossly normal heart sounds.  Good peripheral circulation.  Elevated blood pressure Respiratory: Normal respiratory effort.  No retractions. Lungs CTAB. Gastrointestinal: Soft and nontender. No distention. No abdominal bruits. No CVA tenderness. Musculoskeletal: Patient is diffuse guarding along the lumbar spine.  Patient straight leg test on the right lower extremity.  Neurologic:  Normal speech and language. No gross focal neurologic deficits are appreciated. No gait instability. Skin:  Skin is warm, dry and intact. No rash noted. Psychiatric: Mood and affect are normal. Speech and behavior are normal.  ____________________________________________   LABS (all labs ordered are listed, but only abnormal results are displayed)  Labs Reviewed - No data to display ____________________________________________  EKG   ____________________________________________  RADIOLOGY  ED MD interpretation:    Official radiology report(s): No  results found.  ____________________________________________   PROCEDURES  Procedure(s) performed (including Critical Care):  Procedures   ____________________________________________   INITIAL IMPRESSION / ASSESSMENT AND PLAN / ED COURSE  As part of my medical decision making, I reviewed the following data within the electronic MEDICAL RECORD NUMBER         Bryce [name] was evaluated in Emergency Department on 02/23/2019 for the symptoms described in the history of present illness. He was evaluated in the context of the global COVID-19 pandemic, which necessitated consideration that the patient might be at risk for infection with the SARS-CoV-2 virus that causes COVID-19. Institutional protocols and algorithms that pertain to the evaluation of patients at risk for COVID-19 are in a state of rapid change based on information released by regulatory bodies including the CDC and federal and state organizations. These policies and algorithms were followed during the patient's care in the ED.  Patient presents with acute back pain exacerbated by disability testing 2 days ago.  Patient given discharge care instructions advised take medication as directed.  Patient vies follow-up PCP.      ____________________________________________   FINAL CLINICAL IMPRESSION(S) / ED DIAGNOSES  Final diagnoses:  Chronic midline low back pain with sciatica, sciatica laterality unspecified     ED Discharge Orders         Ordered    oxyCODONE-acetaminophen (PERCOCET) 7.5-325 MG tablet  Every 6 hours PRN     02/23/19 1025    cyclobenzaprine (FLEXERIL) 10 MG tablet  3 times daily PRN     02/23/19 1025    methylPREDNISolone (MEDROL DOSEPAK) 4 MG TBPK tablet     02/23/19 1025           Note:  This document was prepared using Dragon voice recognition software and may include unintentional dictation errors.    Joni Reining, PA-C 02/23/19 1029    Phineas Semen, MD 02/23/19 [phone]

## 2019-02-24 ENCOUNTER — Ambulatory Visit: Payer: Medicaid Other | Admitting: Urology

## 2019-02-25 ENCOUNTER — Ambulatory Visit: Payer: Medicaid Other | Admitting: Gerontology

## 2019-03-07 ENCOUNTER — Other Ambulatory Visit: Payer: Self-pay

## 2019-03-07 ENCOUNTER — Emergency Department
Admission: EM | Admit: 2019-03-07 | Discharge: 2019-03-07 | Disposition: A | Discharge status: Home / Self Care | Payer: Self-pay | Attending: Student | Admitting: Student

## 2019-03-07 ENCOUNTER — Encounter: Payer: Self-pay | Admitting: Emergency Medicine

## 2019-03-07 ENCOUNTER — Emergency Department: Payer: Self-pay

## 2019-03-07 DIAGNOSIS — I2510 Atherosclerotic heart disease of native coronary artery without angina pectoris: Secondary | ICD-10-CM | POA: Insufficient documentation

## 2019-03-07 DIAGNOSIS — I5022 Chronic systolic (congestive) heart failure: Secondary | ICD-10-CM | POA: Insufficient documentation

## 2019-03-07 DIAGNOSIS — Y9289 Other specified places as the place of occurrence of the external cause: Secondary | ICD-10-CM | POA: Insufficient documentation

## 2019-03-07 DIAGNOSIS — Y999 Unspecified external cause status: Secondary | ICD-10-CM | POA: Insufficient documentation

## 2019-03-07 DIAGNOSIS — Y9389 Activity, other specified: Secondary | ICD-10-CM | POA: Insufficient documentation

## 2019-03-07 DIAGNOSIS — I110 Hypertensive heart disease with heart failure: Secondary | ICD-10-CM | POA: Insufficient documentation

## 2019-03-07 DIAGNOSIS — F17210 Nicotine dependence, cigarettes, uncomplicated: Secondary | ICD-10-CM | POA: Insufficient documentation

## 2019-03-07 DIAGNOSIS — I252 Old myocardial infarction: Secondary | ICD-10-CM | POA: Insufficient documentation

## 2019-03-07 DIAGNOSIS — Z7982 Long term (current) use of aspirin: Secondary | ICD-10-CM | POA: Insufficient documentation

## 2019-03-07 DIAGNOSIS — Z79899 Other long term (current) drug therapy: Secondary | ICD-10-CM | POA: Insufficient documentation

## 2019-03-07 DIAGNOSIS — Z955 Presence of coronary angioplasty implant and graft: Secondary | ICD-10-CM | POA: Insufficient documentation

## 2019-03-07 DIAGNOSIS — S92154A Nondisplaced avulsion fracture (chip fracture) of right talus, initial encounter for closed fracture: Secondary | ICD-10-CM | POA: Insufficient documentation

## 2019-03-07 DIAGNOSIS — X501XXA Overexertion from prolonged static or awkward postures, initial encounter: Secondary | ICD-10-CM | POA: Insufficient documentation

## 2019-03-07 MED ORDER — MELOXICAM 15 MG PO TABS: 15.0000 mg | ORAL_TABLET | Freq: Every day | ORAL | 0 refills | Status: DC

## 2019-03-07 MED ORDER — OXYCODONE-ACETAMINOPHEN 5-325 MG PO TABS: 1.0000 | ORAL_TABLET | Freq: Four times a day (QID) | ORAL | 0 refills | Status: DC | PRN

## 2019-03-07 NOTE — ED Triage Notes (Signed)
Pt to ED via POV c/o right foot pain. Pt states that he was outside last night and stepped wrong, thinks he may have broken his foot. Pt is in NAD.

## 2019-03-07 NOTE — ED Notes (Signed)
Pt tripped and fell and hurt right ankle. Top of right foot and ankle swollen. Ice pack given

## 2019-03-07 NOTE — Discharge Instructions (Signed)
Pickup an ASO lace up stirrup ankle brace to further support her foot.

## 2019-03-07 NOTE — ED Provider Notes (Signed)
[facility] Emergency Department Provider Note  ____________________________________________  Time seen: Approximately 4:04 PM  I have reviewed the triage vital signs and the nursing notes.   HISTORY  Chief Complaint Foot Pain    HPI Bryce [name] is a 53 y.o. male who presents the emergency department complaining of right ankle and foot pain.  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  Patient reports that he was coming down on his back stairs when he missed a step, rolling his ankle and landing heavily on his foot.  This occurred yesterday, patient thought he just sprained his ankle and give it overnight to rest.  Patient reports that he can bear weight with difficulty and that the pain has increased instead of improving.  Patient denies any other injury or complaint at this time.  Over-the-counter medications have not provided relief of symptoms.  Patient has medical history as described below with no complaints of chronic medical problems.         Past Medical History:  Diagnosis Date  . Chronic systolic congestive heart failure (HCC)   . Coronary artery disease   . GERD (gastroesophageal reflux disease)   . Hypertension   . Ischemic cardiomyopathy   . MI (myocardial infarction) (HCC)   . Migraine     Patient Active Problem List   Diagnosis Date Noted  . Prediabetes 01/28/2019  . Erectile dysfunction 01/06/2019  . Numbness 01/06/2019  . History of migraine headaches 01/06/2019  . Atherosclerosis of native coronary artery of native heart with stable angina pectoris (HCC) 11/02/2018  . Acute renal failure (ARF) (HCC) 01/13/2018  . CAD (coronary artery disease) 02/20/2017  . Dehydration 02/20/2017  . STEMI (ST elevation myocardial infarction) (HCC) 02/02/2017  . AKI (acute kidney injury) (HCC) 12/15/2015  . Solitary pulmonary nodule  12/23/2014  . Chest pain 12/22/2014  . Hypertension 12/22/2014  . Hyperlipidemia 12/22/2014    Past Surgical History:  Procedure Laterality Date  . CARDIAC CATHETERIZATION N/A 12/28/2014   Procedure: Left Heart Cath and Coronary Angiography;  Surgeon: Alwyn Pea, MD;  Location: [facility] INVASIVE CV LAB;  Service: Cardiovascular;  Laterality: N/A;  . CORONARY ANGIOPLASTY     Non-ST elevation MI status post stenting within bare-metal stent of the 75% lesion of the LAD, 11/13/2010.   . CORONARY STENT INTERVENTION N/A 02/02/2017   Procedure: CORONARY/GRAFT ACUTE MI REVASCULARIZATION;  Surgeon: Alwyn Pea, MD;  Location: [facility] INVASIVE CV LAB;  Service: Cardiovascular;  Laterality: N/A;  . LEFT HEART CATH AND CORONARY ANGIOGRAPHY N/A 02/02/2017   Procedure: LEFT HEART CATH AND CORONARY ANGIOGRAPHY;  Surgeon: Alwyn Pea, MD;  Location: [facility] INVASIVE CV LAB;  Service: Cardiovascular;  Laterality: N/A;    Prior to Admission medications   Medication Sig Start Date End Date Taking? Authorizing Provider  aspirin EC 81 MG EC tablet Take 1 tablet (81 mg total) by mouth daily. 02/05/17   Katha Hamming, MD  aspirin-acetaminophen-caffeine (EXCEDRIN MIGRAINE) [phone] MG tablet Take 1 tablet by mouth daily as needed for headache. 01/06/19   Iloabachie, Chioma E, NP  clopidogrel (PLAVIX) 75 MG tablet TAKE ONE TABLET BY MOUTH EVERY DAY 09/17/18   Iloabachie, Chioma E, NP  cyclobenzaprine (FLEXERIL) 10 MG tablet Take 1 tablet (10 mg total) by mouth 3 (three) times daily as needed. 02/23/19   Joni Reining, PA-C  Docosanol (ABREVA) 10 % CREA Apply 1 application  topically 2 (two) times daily. 01/15/18   Dustin Flock, MD  ezetimibe (ZETIA) 10 MG tablet Take 1 tablet (10 mg total) by mouth daily. 11/03/18 02/01/19  Minna Merritts, MD  gabapentin (NEURONTIN) 100 MG capsule Take 1 capsule (100 mg total) by mouth 3 (three) times daily. 01/28/19   Iloabachie, Chioma E, NP  lisinopril  (PRINIVIL,ZESTRIL) 20 MG tablet TAKE ONE TABLET BY MOUTH EVERY DAY 09/17/18   Iloabachie, Chioma E, NP  meloxicam (MOBIC) 15 MG tablet Take 1 tablet (15 mg total) by mouth daily. 03/07/19   Ellamay Fors, Charline Bills, PA-C  methylPREDNISolone (MEDROL DOSEPAK) 4 MG TBPK tablet Take Tapered dose as directed 02/23/19   Sable Feil, PA-C  metoprolol tartrate (LOPRESSOR) 25 MG tablet TAKE ONE TABLET BY MOUTH 2 TIMES A DAY 01/06/19   Iloabachie, Chioma E, NP  nitroGLYCERIN (NITROSTAT) 0.4 MG SL tablet Place 1 tablet (0.4 mg total) under the tongue every 5 (five) minutes x 3 doses as needed for chest pain. 02/04/17   Epifanio Lesches, MD  oxyCODONE-acetaminophen (PERCOCET/ROXICET) 5-325 MG tablet Take 1 tablet by mouth every 6 (six) hours as needed for severe pain. 03/07/19   Aftan Vint, Charline Bills, PA-C  pantoprazole (PROTONIX) 40 MG tablet Take 1 tablet (40 mg total) by mouth daily. 01/28/19   Iloabachie, Chioma E, NP  pravastatin (PRAVACHOL) 40 MG tablet Take 1 tablet (40 mg total) by mouth daily. 04/02/18   Iloabachie, Chioma E, NP    Allergies Atorvastatin  Family History  Problem Relation Age of Onset  . CAD Father   . Kidney disease Neg Hx   . Diabetes Mellitus II Neg Hx     Social History Social History   Tobacco Use  . Smoking status: Current Some Day Smoker    Packs/day: 1.00    Years: 5.00    Pack years: 5.00    Types: Cigarettes    Last attempt to quit: 01/05/2017    Years since quitting: 2.1  . Smokeless tobacco: Never Used  . Tobacco comment: states smokes occasionally 10-12-18  Substance Use Topics  . Alcohol use: Not Currently  . Drug use: Yes    Types: Marijuana     Review of Systems  Constitutional: No fever/chills Eyes: No visual changes. No discharge ENT: No upper respiratory complaints. Cardiovascular: no chest pain. Respiratory: no cough. No SOB. Gastrointestinal: No abdominal pain.  No nausea, no vomiting.   Musculoskeletal: Right foot and ankle pain following  injury last night Skin: Negative for rash, abrasions, lacerations, ecchymosis. Neurological: Negative for headaches, focal weakness or numbness. 10-point ROS otherwise negative.  ____________________________________________   PHYSICAL EXAM:  VITAL SIGNS: ED Triage Vitals  Enc Vitals Group     BP 03/07/19 1524 125/90     Pulse Rate 03/07/19 1524 100     Resp 03/07/19 1524 16     Temp 03/07/19 1524 98.4 F (36.9 C)     Temp Source 03/07/19 1524 Oral     SpO2 03/07/19 1524 100 %     Weight 03/07/19 1525 245 lb (111.1 kg)     Height 03/07/19 1525 5\' 8"  (1.727 m)     Head Circumference --      Peak Flow --      Pain Score 03/07/19 1525 7     Pain Loc --      Pain Edu? --      Excl. in [place]? --      Constitutional: Alert and oriented. Well appearing and in no acute  distress. Eyes: Conjunctivae are normal. PERRL. EOMI. Head: Atraumatic. ENT:      Ears:       Nose: No congestion/rhinnorhea.      Mouth/Throat: Mucous membranes are moist.  Neck: No stridor.    Cardiovascular: Normal rate, regular rhythm. Normal S1 and S2.  Good peripheral circulation. Respiratory: Normal respiratory effort without tachypnea or retractions. Lungs CTAB. Good air entry to the bases with no decreased or absent breath sounds. Musculoskeletal: Full range of motion to all extremities. No gross deformities appreciated.  Visualization of the right foot and ankle reveals no gross deformity.  Patient does have mild edema along the talonavicular joint line.  No lacerations or abrasions noted.  Patient is very tender to palpation along the talonavicular joint line without any other significant tenderness to palpation over the osseous structures of the foot and ankle.  Dorsalis pedis pulse intact.  Sensation intact all digits.  Capillary refill less than 2 seconds all digits. Neurologic:  Normal speech and language. No gross focal neurologic deficits are appreciated.  Skin:  Skin is warm, dry and intact. No rash  noted. Psychiatric: Mood and affect are normal. Speech and behavior are normal. Patient exhibits appropriate insight and judgement.   ____________________________________________   LABS (all labs ordered are listed, but only abnormal results are displayed)  Labs Reviewed - No data to display ____________________________________________  EKG   ____________________________________________  RADIOLOGY I personally viewed and evaluated these images as part of my medical decision making, as well as reviewing the written report by the radiologist.  Dg Ankle Complete Right  Result Date: 03/07/2019 CLINICAL DATA:  Rolling injury right ankle after missing a step. EXAM: RIGHT ANKLE - COMPLETE 3+ VIEW COMPARISON:  None. FINDINGS: Ankle mortise is normal. No acute fracture or dislocation. No significant soft tissue abnormality. IMPRESSION: Negative. Electronically Signed   By: Elberta Fortisaniel  Boyle M.D.   On: 03/07/2019 16:43   Dg Foot Complete Right  Result Date: 03/07/2019 CLINICAL DATA:  Lateral right ankle and foot pain following an injury last night. EXAM: RIGHT FOOT COMPLETE - 3+ VIEW COMPARISON:  Right ankle radiographs obtained today. FINDINGS: Small avulsion fracture off the lateral aspect of the distal talus. Medial soft tissue swelling. Mild dorsal distal talar spur formation. IMPRESSION: Small avulsion fracture off the lateral aspect of the distal talus. Electronically Signed   By: Beckie SaltsSteven  Reid M.D.   On: 03/07/2019 16:44    ____________________________________________    PROCEDURES  Procedure(s) performed:    Procedures    Medications - No data to display   ____________________________________________   INITIAL IMPRESSION / ASSESSMENT AND PLAN / ED COURSE  Pertinent labs & imaging results that were available during my care of the patient were reviewed by me and considered in my medical decision making (see chart for details).  Review of the [place] CSRS was performed in  accordance of the [facility] prior to dispensing any controlled drugs.           Patient's diagnosis is consistent with nondisplaced avulsion fracture of the right talus.  Patient presented to the emergency department after rolling his ankle last night.  Patient complained of ongoing pain.  Patient was tender to palpation over the talonavicular joint line.  Findings on x-ray revealed nondisplaced avulsion fracture of the talus.  Patient is already using a cane and does not want to use crutches.  Patient's ankle will be wrapped with Ace bandage the patient is instructed to use an ASO lace up stirrup ankle brace  outside of the emergency department.  Patient will be prescribed anti-inflammatory and very limited pain medication.  Follow-up with podiatry as needed..  Patient is given ED precautions to return to the ED for any worsening or new symptoms.     ____________________________________________  FINAL CLINICAL IMPRESSION(S) / ED DIAGNOSES  Final diagnoses:  Closed nondisplaced avulsion fracture of right talus, initial encounter      NEW MEDICATIONS STARTED DURING THIS VISIT:  ED Discharge Orders         Ordered    meloxicam (MOBIC) 15 MG tablet  Daily     03/07/19 1759    oxyCODONE-acetaminophen (PERCOCET/ROXICET) 5-325 MG tablet  Every 6 hours PRN     03/07/19 1759              This chart was dictated using voice recognition software/Dragon. Despite best efforts to proofread, errors can occur which can change the meaning. Any change was purely unintentional.    Racheal Patches, PA-C 03/07/19 1800    Miguel Aschoff., MD 03/08/19 1248

## 2019-03-10 ENCOUNTER — Telehealth: Payer: Self-pay | Admitting: Pharmacy Technician

## 2019-03-10 NOTE — Telephone Encounter (Signed)
Received 2020 proof of income.  Patient eligible to receive medication assistance at [facility] as long as eligibility requirements continue to be met.  [facility]

## 2019-03-11 ENCOUNTER — Encounter: Payer: Self-pay | Admitting: Emergency Medicine

## 2019-03-11 ENCOUNTER — Other Ambulatory Visit: Payer: Self-pay

## 2019-03-11 ENCOUNTER — Emergency Department
Admission: EM | Admit: 2019-03-11 | Discharge: 2019-03-11 | Disposition: A | Discharge status: Home / Self Care | Payer: Self-pay | Attending: Emergency Medicine | Admitting: Emergency Medicine

## 2019-03-11 ENCOUNTER — Emergency Department: Payer: Self-pay

## 2019-03-11 DIAGNOSIS — Z79899 Other long term (current) drug therapy: Secondary | ICD-10-CM | POA: Insufficient documentation

## 2019-03-11 DIAGNOSIS — I5022 Chronic systolic (congestive) heart failure: Secondary | ICD-10-CM | POA: Insufficient documentation

## 2019-03-11 DIAGNOSIS — I2510 Atherosclerotic heart disease of native coronary artery without angina pectoris: Secondary | ICD-10-CM | POA: Insufficient documentation

## 2019-03-11 DIAGNOSIS — Y939 Activity, unspecified: Secondary | ICD-10-CM | POA: Insufficient documentation

## 2019-03-11 DIAGNOSIS — F17210 Nicotine dependence, cigarettes, uncomplicated: Secondary | ICD-10-CM | POA: Insufficient documentation

## 2019-03-11 DIAGNOSIS — I110 Hypertensive heart disease with heart failure: Secondary | ICD-10-CM | POA: Insufficient documentation

## 2019-03-11 DIAGNOSIS — M7021 Olecranon bursitis, right elbow: Secondary | ICD-10-CM | POA: Insufficient documentation

## 2019-03-11 DIAGNOSIS — Z7982 Long term (current) use of aspirin: Secondary | ICD-10-CM | POA: Insufficient documentation

## 2019-03-11 DIAGNOSIS — W19XXXD Unspecified fall, subsequent encounter: Secondary | ICD-10-CM | POA: Insufficient documentation

## 2019-03-11 NOTE — ED Triage Notes (Signed)
States he fell a few days ago   Developed pain and swelling to right elbow

## 2019-03-11 NOTE — ED Provider Notes (Signed)
[facility] Emergency Department Provider Note   ____________________________________________   First MD Initiated Contact with Patient 03/11/19 [phone]     (approximate)  I have reviewed the triage vital signs and the nursing notes.   HISTORY  Chief Complaint Fall   HPI Bryce [name] is a 53 y.o. male patient complain of right posterior elbow pain and edema secondary to a fall 4 days ago.  Patient receives facility 4 days ago secondary to fall and was diagnosed with a small avulsion fracture of the ankle.  Patient states that ankle hurts the most that he did not notice the pain in the elbow.  Patient states 2 days ago he started experiencing increased right elbow pain and noticed increased swelling.  Patient states pain increases with extension of the elbow.  Patient rates the pain as a 7/10.  Patient described the pain as "aching".  No palliative measure for complaint.         Past Medical History:  Diagnosis Date   Chronic systolic congestive heart failure (HCC)    Coronary artery disease    GERD (gastroesophageal reflux disease)    Hypertension    Ischemic cardiomyopathy    MI (myocardial infarction) (HCC)    Migraine     Patient Active Problem List   Diagnosis Date Noted   Prediabetes 01/28/2019   Erectile dysfunction 01/06/2019   Numbness 01/06/2019   History of migraine headaches 01/06/2019   Atherosclerosis of native coronary artery of native heart with stable angina pectoris (HCC) 11/02/2018   Acute renal failure (ARF) (HCC) 01/13/2018   CAD (coronary artery disease) 02/20/2017   Dehydration 02/20/2017   STEMI (ST elevation myocardial infarction) (HCC) 02/02/2017   AKI (acute kidney injury) (HCC) 12/15/2015   Solitary pulmonary nodule 12/23/2014   Chest pain 12/22/2014   Hypertension 12/22/2014   Hyperlipidemia 12/22/2014    Past Surgical History:  Procedure Laterality Date   CARDIAC CATHETERIZATION N/A  12/28/2014   Procedure: Left Heart Cath and Coronary Angiography;  Surgeon: Alwyn Peawayne D Callwood, MD;  Location: [facility] INVASIVE CV LAB;  Service: Cardiovascular;  Laterality: N/A;   CORONARY ANGIOPLASTY     Non-ST elevation MI status post stenting within bare-metal stent of the 75% lesion of the LAD, 11/13/2010.    CORONARY STENT INTERVENTION N/A 02/02/2017   Procedure: CORONARY/GRAFT ACUTE MI REVASCULARIZATION;  Surgeon: Alwyn Peaallwood, Dwayne D, MD;  Location: [facility] INVASIVE CV LAB;  Service: Cardiovascular;  Laterality: N/A;   LEFT HEART CATH AND CORONARY ANGIOGRAPHY N/A 02/02/2017   Procedure: LEFT HEART CATH AND CORONARY ANGIOGRAPHY;  Surgeon: Alwyn Peaallwood, Dwayne D, MD;  Location: [facility] INVASIVE CV LAB;  Service: Cardiovascular;  Laterality: N/A;    Prior to Admission medications   Medication Sig Start Date End Date Taking? Authorizing Provider  aspirin EC 81 MG EC tablet Take 1 tablet (81 mg total) by mouth daily. 02/05/17   Katha HammingKonidena, Snehalatha, MD  aspirin-acetaminophen-caffeine (EXCEDRIN MIGRAINE) [id number] MG tablet Take 1 tablet by mouth daily as needed for headache. 01/06/19   Iloabachie, Chioma E, NP  clopidogrel (PLAVIX) 75 MG tablet TAKE ONE TABLET BY MOUTH EVERY DAY 09/17/18   Iloabachie, Chioma E, NP  cyclobenzaprine (FLEXERIL) 10 MG tablet Take 1 tablet (10 mg total) by mouth 3 (three) times daily as needed. 02/23/19   Joni ReiningSmith, Tige Meas K, PA-C  Docosanol (ABREVA) 10 % CREA Apply 1 application topically 2 (two) times daily. 01/15/18   Auburn BilberryPatel, Shreyang, MD  ezetimibe (ZETIA) 10 MG tablet Take 1 tablet (10 mg  total) by mouth daily. 11/03/18 02/01/19  Antonieta Iba, MD  gabapentin (NEURONTIN) 100 MG capsule Take 1 capsule (100 mg total) by mouth 3 (three) times daily. 01/28/19   Iloabachie, Chioma E, NP  lisinopril (PRINIVIL,ZESTRIL) 20 MG tablet TAKE ONE TABLET BY MOUTH EVERY DAY 09/17/18   Iloabachie, Chioma E, NP  meloxicam (MOBIC) 15 MG tablet Take 1 tablet (15 mg total) by mouth daily. 03/07/19   Cuthriell,  Delorise Royals, PA-C  metoprolol tartrate (LOPRESSOR) 25 MG tablet TAKE ONE TABLET BY MOUTH 2 TIMES A DAY 01/06/19   Iloabachie, Chioma E, NP  nitroGLYCERIN (NITROSTAT) 0.4 MG SL tablet Place 1 tablet (0.4 mg total) under the tongue every 5 (five) minutes x 3 doses as needed for chest pain. 02/04/17   Katha Hamming, MD  oxyCODONE-acetaminophen (PERCOCET/ROXICET) 5-325 MG tablet Take 1 tablet by mouth every 6 (six) hours as needed for severe pain. 03/07/19   Cuthriell, Delorise Royals, PA-C  pantoprazole (PROTONIX) 40 MG tablet Take 1 tablet (40 mg total) by mouth daily. 01/28/19   Iloabachie, Chioma E, NP  pravastatin (PRAVACHOL) 40 MG tablet Take 1 tablet (40 mg total) by mouth daily. 04/02/18   Iloabachie, Chioma E, NP    Allergies Atorvastatin  Family History  Problem Relation Age of Onset   CAD Father    Kidney disease Neg Hx    Diabetes Mellitus II Neg Hx     Social History Social History   Tobacco Use   Smoking status: Current Some Day Smoker    Packs/day: 1.00    Years: 5.00    Pack years: 5.00    Types: Cigarettes    Last attempt to quit: 01/05/2017    Years since quitting: 2.1   Smokeless tobacco: Never Used   Tobacco comment: states smokes occasionally 10-12-18  Substance Use Topics   Alcohol use: Not Currently   Drug use: Yes    Types: Marijuana    Review of Systems Constitutional: No fever/chills Eyes: No visual changes. ENT: No sore throat. Cardiovascular: Denies chest pain. Respiratory: Denies shortness of breath. Gastrointestinal: No abdominal pain.  No nausea, no vomiting.  No diarrhea.  No constipation. Genitourinary: Negative for dysuria. Musculoskeletal: Right elbow pain.   Skin: Negative for rash. Neurological: Negative for headaches, focal weakness or numbness. Endocrine:  Hyperlipidemia, hypertension, prediabetes.  Allergic/Immunilogical: Atorvastatin  ____________________________________________   PHYSICAL EXAM:  VITAL SIGNS: ED Triage  Vitals [03/11/19 0737]  Enc Vitals Group     BP      Pulse      Resp      Temp      Temp src      SpO2      Weight      Height      Head Circumference      Peak Flow      Pain Score 7     Pain Loc      Pain Edu?      Excl. in [place]?    Constitutional: Alert and oriented. Well appearing and in no acute distress.  Obesity Cardiovascular: Normal rate, regular rhythm. Grossly normal heart sounds.  Good peripheral circulation. Respiratory: Normal respiratory effort.  No retractions. Lungs CTAB. Gastrointestinal: Soft and nontender. No distention. No abdominal bruits. No CVA tenderness. Genitourinary: Deferred Musculoskeletal: No lower extremity tenderness nor edema.  No joint effusions.  Mild peripheral edema bilateral leg.  Mild guarding palpation anterior aspect of right ankle. Neurologic:  Normal speech and language. No gross focal neurologic deficits  are appreciated. No gait instability. Skin:  Skin is warm, dry and intact. No rash noted. Psychiatric: Mood and affect are normal. Speech and behavior are normal.  ____________________________________________   LABS (all labs ordered are listed, but only abnormal results are displayed)  Labs Reviewed - No data to display ____________________________________________  EKG   ____________________________________________  RADIOLOGY  ED MD interpretation:    Official radiology report(s): Dg Elbow 2 Views Right  Result Date: 03/11/2019 CLINICAL DATA:  Fall, elbow pain EXAM: RIGHT ELBOW - 2 VIEW COMPARISON:  None. FINDINGS: Technical note: Ordered examination is radiographs of the right elbow. Images submitted for review are labeled left. No fracture or dislocation of the left elbow. Joint spaces are preserved. No elbow joint effusion. There is soft tissue edema overlying the olecranon. IMPRESSION: 1. Technical note: Ordered examination is radiographs of the right elbow. Images submitted for review are labeled left. 2. No fracture or  dislocation of the left elbow. Joint spaces are preserved. No elbow joint effusion. 3.  There is soft tissue edema overlying the olecranon. Electronically Signed   By: Eddie Candle M.D.   On: 03/11/2019 08:39    ____________________________________________   PROCEDURES  Procedure(s) performed (including Critical Care):  Procedures   ____________________________________________   INITIAL IMPRESSION / ASSESSMENT AND PLAN / ED COURSE  As part of my medical decision making, I reviewed the following data within the [place] was evaluated in Emergency Department on 03/11/2019 for the symptoms described in the history of present illness. He was evaluated in the context of the global COVID-19 pandemic, which necessitated consideration that the patient might be at risk for infection with the SARS-CoV-2 virus that causes COVID-19. Institutional protocols and algorithms that pertain to the evaluation of patients at risk for COVID-19 are in a state of rapid change based on information released by regulatory bodies including the CDC and federal and state organizations. These policies and algorithms were followed during the patient's care in the ED.  Patient presents with pain and edema to the posterior right elbow secondary to fall.  Discussed x-ray findings with patient.  Patient placed in arm sling given discharge care instruction.  Patient advised follow orthopedic if no improvement or worsening complaint in 4 days.      ____________________________________________   FINAL CLINICAL IMPRESSION(S) / ED DIAGNOSES  Final diagnoses:  Olecranon bursitis of right elbow     ED Discharge Orders    None       Note:  This document was prepared using Dragon voice recognition software and may include unintentional dictation errors.    Sable Feil, PA-C 03/11/19 [id number]    Nena Polio, MD 03/11/19 [phone]

## 2019-03-11 NOTE — Discharge Instructions (Signed)
Wear sling follow discharge care instruction.  If no improvement in 4 days follow-up by calling orthopedic department.  Tell them you are a follow-up from the emergency room.

## 2019-03-17 ENCOUNTER — Other Ambulatory Visit: Payer: Self-pay

## 2019-03-17 ENCOUNTER — Encounter: Payer: Self-pay | Admitting: Emergency Medicine

## 2019-03-17 ENCOUNTER — Emergency Department
Admission: EM | Admit: 2019-03-17 | Discharge: 2019-03-17 | Disposition: A | Discharge status: Home / Self Care | Payer: Medicaid Other | Attending: Emergency Medicine | Admitting: Emergency Medicine

## 2019-03-17 DIAGNOSIS — I110 Hypertensive heart disease with heart failure: Secondary | ICD-10-CM | POA: Insufficient documentation

## 2019-03-17 DIAGNOSIS — W19XXXA Unspecified fall, initial encounter: Secondary | ICD-10-CM | POA: Insufficient documentation

## 2019-03-17 DIAGNOSIS — I2510 Atherosclerotic heart disease of native coronary artery without angina pectoris: Secondary | ICD-10-CM | POA: Insufficient documentation

## 2019-03-17 DIAGNOSIS — L03113 Cellulitis of right upper limb: Secondary | ICD-10-CM

## 2019-03-17 DIAGNOSIS — F17210 Nicotine dependence, cigarettes, uncomplicated: Secondary | ICD-10-CM | POA: Insufficient documentation

## 2019-03-17 DIAGNOSIS — Z955 Presence of coronary angioplasty implant and graft: Secondary | ICD-10-CM | POA: Insufficient documentation

## 2019-03-17 DIAGNOSIS — Z7902 Long term (current) use of antithrombotics/antiplatelets: Secondary | ICD-10-CM | POA: Insufficient documentation

## 2019-03-17 DIAGNOSIS — Z7982 Long term (current) use of aspirin: Secondary | ICD-10-CM | POA: Insufficient documentation

## 2019-03-17 DIAGNOSIS — I5022 Chronic systolic (congestive) heart failure: Secondary | ICD-10-CM | POA: Insufficient documentation

## 2019-03-17 DIAGNOSIS — Z79899 Other long term (current) drug therapy: Secondary | ICD-10-CM | POA: Insufficient documentation

## 2019-03-17 LAB — COMPREHENSIVE METABOLIC PANEL
ALT: 28 U/L (ref 0–44)
AST: 19 U/L (ref 15–41)
Albumin: 4.1 g/dL (ref 3.5–5.0)
Alkaline Phosphatase: 66 U/L (ref 38–126)
Anion gap: 7 (ref 5–15)
BUN: 14 mg/dL (ref 6–20)
CO2: 25 mmol/L (ref 22–32)
Calcium: 9.2 mg/dL (ref 8.9–10.3)
Chloride: 106 mmol/L (ref 98–111)
Creatinine, Ser: 0.85 mg/dL (ref 0.61–1.24)
GFR calc Af Amer: >60 mL/min (ref >60)
GFR calc non Af Amer: >60 mL/min (ref >60)
Glucose, Bld: 106 mg/dL — ABNORMAL HIGH (ref 70–99)
Potassium: 3.8 mmol/L (ref 3.5–5.1)
Sodium: 138 mmol/L (ref 135–145)
Total Bilirubin: 0.9 mg/dL (ref 0.3–1.2)
Total Protein: 7.4 g/dL (ref 6.5–8.1)

## 2019-03-17 LAB — CBC WITH DIFFERENTIAL/PLATELET
Abs Immature Granulocytes: 0.01 10*3/uL (ref 0.00–0.07)
Basophils Absolute: 0 10*3/uL (ref 0.0–0.1)
Basophils Relative: 0 %
Eosinophils Absolute: 0.1 10*3/uL (ref 0.0–0.5)
Eosinophils Relative: 2 %
HCT: 42.7 % (ref 39.0–52.0)
Hemoglobin: 14.5 g/dL (ref 13.0–17.0)
Immature Granulocytes: 0 %
Lymphocytes Relative: 24 %
Lymphs Abs: 1.4 10*3/uL (ref 0.7–4.0)
MCH: 31.7 pg (ref 26.0–34.0)
MCHC: 34 g/dL (ref 30.0–36.0)
MCV: 93.4 fL (ref 80.0–100.0)
Monocytes Absolute: 0.5 10*3/uL (ref 0.1–1.0)
Monocytes Relative: 9 %
Neutro Abs: 4 10*3/uL (ref 1.7–7.7)
Neutrophils Relative %: 65 %
Platelets: 206 10*3/uL (ref 150–400)
RBC: 4.57 MIL/uL (ref 4.22–5.81)
RDW: 13.2 % (ref 11.5–15.5)
WBC: 6.1 10*3/uL (ref 4.0–10.5)
nRBC: 0 % (ref 0.0–0.2)

## 2019-03-17 LAB — LACTIC ACID, PLASMA: Lactic Acid, Venous: 1.2 mmol/L (ref 0.5–1.9)

## 2019-03-17 MED ORDER — CEPHALEXIN 500 MG PO CAPS: 500.0000 mg | ORAL_CAPSULE | Freq: Four times a day (QID) | ORAL | 0 refills | Status: AC

## 2019-03-17 NOTE — ED Triage Notes (Signed)
Fell Saturday, came in Sunday for rt elbow/ankle injury; having increased swelling to rt elbow/upper FA with redness & warmth; awoke hr PTA with swelling to LE(s); last wk 242lb and tonight 255lb

## 2019-03-17 NOTE — ED Provider Notes (Signed)
[facility] Emergency Department Provider Note   ____________________________________________   I have reviewed the triage vital signs and the nursing notes.   HISTORY  Chief Complaint Elbow Injury   History limited by: Not Limited   HPI Bryce [name] is a 53 y.o. male who presents to the emergency department today because of concerns for right elbow pain, swelling and redness.  Patient states that he had a fall onto that elbow.  He had been seen in the emergency department and had negative x-ray of the elbow.  Since his most recent ED visit he states he has noticed some increased redness to that area.  He has also noticed some burning to that area.  The patient denies any fevers.  Denies any nausea or vomiting.   Records reviewed. Per medical record review patient has a history of CAD, GERD, MI. Recent ER visits after fall. Negative x-ray of the right elbow.   Past Medical History:  Diagnosis Date  . Chronic systolic congestive heart failure ([place])   . Coronary artery disease   . GERD (gastroesophageal reflux disease)   . Hypertension   . Ischemic cardiomyopathy   . MI (myocardial infarction) ([place])   . Migraine     Patient Active Problem List   Diagnosis Date Noted  . Prediabetes 01/28/2019  . Erectile dysfunction 01/06/2019  . Numbness 01/06/2019  . History of migraine headaches 01/06/2019  . Atherosclerosis of native coronary artery of native heart with stable angina pectoris ([place]) 11/02/2018  . Acute renal failure (ARF) ([place]) 01/13/2018  . CAD (coronary artery disease) 02/20/2017  . Dehydration 02/20/2017  . STEMI (ST elevation myocardial infarction) ([place]) 02/02/2017  . AKI (acute kidney injury) ([place]) 12/15/2015  . Solitary pulmonary nodule 12/23/2014  . Chest pain 12/22/2014  . Hypertension 12/22/2014  . Hyperlipidemia 12/22/2014    Past Surgical History:  Procedure Laterality Date  . CARDIAC CATHETERIZATION N/A 12/28/2014   Procedure: Left Heart Cath and Coronary Angiography;  Surgeon: Yolonda Kida, MD;  Location: [place] [facility];  Service: Cardiovascular;  Laterality: N/A;  . CORONARY ANGIOPLASTY     Non-ST elevation MI status post stenting within bare-metal stent of the 75% lesion of the LAD, 11/13/2010.   . CORONARY STENT INTERVENTION N/A 02/02/2017   Procedure: CORONARY/GRAFT ACUTE MI REVASCULARIZATION;  Surgeon: Yolonda Kida, MD;  Location: [place] [facility];  Service: Cardiovascular;  Laterality: N/A;  . LEFT HEART CATH AND CORONARY ANGIOGRAPHY N/A 02/02/2017   Procedure: LEFT HEART CATH AND CORONARY ANGIOGRAPHY;  Surgeon: Yolonda Kida, MD;  Location: [place] [facility];  Service: Cardiovascular;  Laterality: N/A;    Prior to Admission medications   Medication Sig Start Date End Date Taking? Authorizing Provider  aspirin EC 81 MG EC tablet Take 1 tablet (81 mg total) by mouth daily. 02/05/17   Epifanio Lesches, MD  aspirin-acetaminophen-caffeine (EXCEDRIN MIGRAINE) [phone] MG tablet Take 1 tablet by mouth daily as needed for headache. 01/06/19   Iloabachie, Chioma E, NP  clopidogrel (PLAVIX) 75 MG tablet TAKE ONE TABLET BY MOUTH EVERY DAY 09/17/18   Iloabachie, Chioma E, NP  cyclobenzaprine (FLEXERIL) 10 MG tablet Take 1 tablet (10 mg total) by mouth 3 (three) times daily as needed. 02/23/19   Sable Feil, PA-C  Docosanol (ABREVA) 10 % CREA Apply 1 application topically 2 (two) times daily. 01/15/18   Dustin Flock, MD  ezetimibe (ZETIA) 10 MG tablet Take 1 tablet (10 mg total) by mouth daily. 11/03/18 02/01/19  Antonieta IbaGollan, Timothy J, MD  gabapentin (NEURONTIN) 100 MG capsule Take 1 capsule (100 mg total) by mouth 3 (three) times daily. 01/28/19   Iloabachie, Chioma E, NP  lisinopril (PRINIVIL,ZESTRIL) 20 MG tablet TAKE ONE TABLET BY MOUTH EVERY DAY 09/17/18   Iloabachie, Chioma E, NP  meloxicam (MOBIC) 15 MG tablet Take 1 tablet (15 mg total) by mouth daily. 03/07/19   Cuthriell, Delorise RoyalsJonathan D,  PA-C  metoprolol tartrate (LOPRESSOR) 25 MG tablet TAKE ONE TABLET BY MOUTH 2 TIMES A DAY 01/06/19   Iloabachie, Chioma E, NP  nitroGLYCERIN (NITROSTAT) 0.4 MG SL tablet Place 1 tablet (0.4 mg total) under the tongue every 5 (five) minutes x 3 doses as needed for chest pain. 02/04/17   Katha HammingKonidena, Snehalatha, MD  oxyCODONE-acetaminophen (PERCOCET/ROXICET) 5-325 MG tablet Take 1 tablet by mouth every 6 (six) hours as needed for severe pain. 03/07/19   Cuthriell, Delorise RoyalsJonathan D, PA-C  pantoprazole (PROTONIX) 40 MG tablet Take 1 tablet (40 mg total) by mouth daily. 01/28/19   Iloabachie, Chioma E, NP  pravastatin (PRAVACHOL) 40 MG tablet Take 1 tablet (40 mg total) by mouth daily. 04/02/18   Iloabachie, Chioma E, NP    Allergies Atorvastatin  Family History  Problem Relation Age of Onset  . CAD Father   . Kidney disease Neg Hx   . Diabetes Mellitus II Neg Hx     Social History Social History   Tobacco Use  . Smoking status: Current Some Day Smoker    Packs/day: 1.00    Years: 5.00    Pack years: 5.00    Types: Cigarettes    Last attempt to quit: 01/05/2017    Years since quitting: 2.1  . Smokeless tobacco: Never Used  . Tobacco comment: states smokes occasionally 10-12-18  Substance Use Topics  . Alcohol use: Not Currently  . Drug use: Yes    Types: Marijuana    Review of Systems Constitutional: No fever/chills Eyes: No visual changes. ENT: No sore throat. Cardiovascular: Denies chest pain. Respiratory: Denies shortness of breath. Gastrointestinal: No abdominal pain.  No nausea, no vomiting.  No diarrhea.   Genitourinary: Negative for dysuria. Musculoskeletal: Positive for right ankle pain. Right elbow pain and redness.  Skin: Negative for rash. Neurological: Negative for headaches, focal weakness or numbness.  ____________________________________________   PHYSICAL EXAM:  VITAL SIGNS: ED Triage Vitals  Enc Vitals Group     BP 03/17/19 0102 (!) 160/108     Pulse Rate 03/17/19  0102 (!) 104     Resp 03/17/19 0102 16     Temp 03/17/19 0102 98.4 F (36.9 C)     Temp Source 03/17/19 0102 Oral     SpO2 03/17/19 0102 98 %     Weight 03/17/19 0102 255 lb (115.7 kg)     Height 03/17/19 0102 5\' 8"  (1.727 m)     Head Circumference --      Peak Flow --      Pain Score 03/17/19 0111 7   Constitutional: Alert and oriented.  Eyes: Conjunctivae are normal.  ENT      Head: Normocephalic and atraumatic.      Nose: No congestion/rhinnorhea.      Mouth/Throat: Mucous membranes are moist.      Neck: No stridor. Hematological/Lymphatic/Immunilogical: No cervical lymphadenopathy. Cardiovascular: Normal rate, regular rhythm.  No murmurs, rubs, or gallops. Respiratory: Normal respiratory effort without tachypnea nor retractions. Breath sounds are clear and equal bilaterally. No wheezes/rales/rhonchi. Gastrointestinal: Soft and non tender. No rebound. No guarding.  Genitourinary: Deferred Musculoskeletal: Some erythema noted to right elbow and right forearm. No fluctuance noted. Right ankle in brace.  Neurologic:  Normal speech and language. No gross focal neurologic deficits are appreciated.  Skin:  Skin is warm, dry and intact. No rash noted. Psychiatric: Mood and affect are normal. Speech and behavior are normal. Patient exhibits appropriate insight and judgment.  ____________________________________________    LABS (pertinent positives/negatives)  CBC wbc 6.1, hgb 14.5, plt 206 CMP wnl except glu 106 Lactic acid 1.2 ____________________________________________   EKG  None  ____________________________________________    RADIOLOGY  None  ____________________________________________   PROCEDURES  Procedures  ____________________________________________   INITIAL IMPRESSION / ASSESSMENT AND PLAN / ED COURSE  Pertinent labs & imaging results that were available during my care of the patient were reviewed by me and considered in my medical decision  making (see chart for details).   Patient presented to the emergency department today because of concerns for right elbow pain and swelling and redness.  On exam patient does have some erythema to the right elbow and right forearm.  I do have concerns for cellulitis.  No fluctuance to suggest abscess.  No fever or leukocytosis.  Will start antibiotics.  Discussed this with the patient.   ____________________________________________   FINAL CLINICAL IMPRESSION(S) / ED DIAGNOSES  Final diagnoses:  Cellulitis of right upper extremity     Note: This dictation was prepared with Dragon dictation. Any transcriptional errors that result from this process are unintentional     Phineas Semen, MD 03/17/19 [phone]

## 2019-03-17 NOTE — Discharge Instructions (Addendum)
Please seek medical attention for any high fevers, chest pain, shortness of breath, change in behavior, persistent vomiting, bloody stool or any other new or concerning symptoms.  

## 2019-03-17 NOTE — ED Notes (Signed)
1st set of blood cultures drawn by Verlene Mayer RN

## 2019-03-30 ENCOUNTER — Emergency Department
Admission: EM | Admit: 2019-03-30 | Discharge: 2019-03-30 | Disposition: A | Discharge status: Home / Self Care | Payer: Self-pay | Attending: Emergency Medicine | Admitting: Emergency Medicine

## 2019-03-30 ENCOUNTER — Emergency Department: Payer: Self-pay

## 2019-03-30 ENCOUNTER — Other Ambulatory Visit: Payer: Self-pay

## 2019-03-30 ENCOUNTER — Encounter: Payer: Self-pay | Admitting: Emergency Medicine

## 2019-03-30 DIAGNOSIS — R600 Localized edema: Secondary | ICD-10-CM

## 2019-03-30 DIAGNOSIS — I110 Hypertensive heart disease with heart failure: Secondary | ICD-10-CM | POA: Insufficient documentation

## 2019-03-30 DIAGNOSIS — Z7902 Long term (current) use of antithrombotics/antiplatelets: Secondary | ICD-10-CM | POA: Insufficient documentation

## 2019-03-30 DIAGNOSIS — F17210 Nicotine dependence, cigarettes, uncomplicated: Secondary | ICD-10-CM | POA: Insufficient documentation

## 2019-03-30 DIAGNOSIS — I2510 Atherosclerotic heart disease of native coronary artery without angina pectoris: Secondary | ICD-10-CM | POA: Insufficient documentation

## 2019-03-30 DIAGNOSIS — R0602 Shortness of breath: Secondary | ICD-10-CM | POA: Insufficient documentation

## 2019-03-30 DIAGNOSIS — R200 Anesthesia of skin: Secondary | ICD-10-CM

## 2019-03-30 DIAGNOSIS — Z7982 Long term (current) use of aspirin: Secondary | ICD-10-CM | POA: Insufficient documentation

## 2019-03-30 DIAGNOSIS — I5022 Chronic systolic (congestive) heart failure: Secondary | ICD-10-CM | POA: Insufficient documentation

## 2019-03-30 DIAGNOSIS — Z79899 Other long term (current) drug therapy: Secondary | ICD-10-CM | POA: Insufficient documentation

## 2019-03-30 LAB — BASIC METABOLIC PANEL
Anion gap: 9 (ref 5–15)
BUN: 17 mg/dL (ref 6–20)
CO2: 23 mmol/L (ref 22–32)
Calcium: 9 mg/dL (ref 8.9–10.3)
Chloride: 105 mmol/L (ref 98–111)
Creatinine, Ser: 0.8 mg/dL (ref 0.61–1.24)
GFR calc Af Amer: >60 mL/min (ref >60)
GFR calc non Af Amer: >60 mL/min (ref >60)
Glucose, Bld: 122 mg/dL — ABNORMAL HIGH (ref 70–99)
Potassium: 3.9 mmol/L (ref 3.5–5.1)
Sodium: 137 mmol/L (ref 135–145)

## 2019-03-30 LAB — CBC
HCT: 43.4 % (ref 39.0–52.0)
Hemoglobin: 14.4 g/dL (ref 13.0–17.0)
MCH: 31.2 pg (ref 26.0–34.0)
MCHC: 33.2 g/dL (ref 30.0–36.0)
MCV: 94.1 fL (ref 80.0–100.0)
Platelets: 214 10*3/uL (ref 150–400)
RBC: 4.61 MIL/uL (ref 4.22–5.81)
RDW: 13.2 % (ref 11.5–15.5)
WBC: 6 10*3/uL (ref 4.0–10.5)
nRBC: 0 % (ref 0.0–0.2)

## 2019-03-30 LAB — BRAIN NATRIURETIC PEPTIDE: B Natriuretic Peptide: 10 pg/mL (ref 0.0–100.0)

## 2019-03-30 LAB — TROPONIN I (HIGH SENSITIVITY)
Troponin I (High Sensitivity): 5 ng/L (ref <18)
Troponin I (High Sensitivity): 4 ng/L (ref <18)

## 2019-03-30 LAB — PROTIME-INR
INR: 0.9 (ref 0.8–1.2)
Prothrombin Time: 12.1 seconds (ref 11.4–15.2)

## 2019-03-30 MED ORDER — GABAPENTIN 100 MG PO CAPS: 100.0000 mg | ORAL_CAPSULE | Freq: Three times a day (TID) | ORAL | 0 refills | Status: DC

## 2019-03-30 MED ORDER — FUROSEMIDE 20 MG PO TABS: 20.0000 mg | ORAL_TABLET | Freq: Every day | ORAL | 0 refills | Status: DC

## 2019-03-30 NOTE — ED Notes (Signed)
Pt wheeled to treatment room. No distress noted.

## 2019-03-30 NOTE — ED Triage Notes (Signed)
Patient reports swelling in bilateral legs x1 week. Swelling and pain worse in right lower leg. Patient also reports increased fatigue and SOB for the last week. States he cannot lay down to sleep because he feels like he's suffocating. Patient states he was told he has CHF in the past but denies taking diuretic. Denies CP.

## 2019-03-30 NOTE — ED Notes (Addendum)
Pt's wife updated about pt status. Pt given phone to call wife back.

## 2019-03-30 NOTE — ED Notes (Signed)
US at bedside

## 2019-03-30 NOTE — ED Notes (Signed)
Pt ambulatory to toilet with steady gait.  

## 2019-03-30 NOTE — ED Provider Notes (Signed)
[facility] Emergency Department Provider Note ____________________________________________   First MD Initiated Contact with Patient 03/30/19 1239     (approximate)  I have reviewed the triage vital signs and the nursing notes.   HISTORY  Chief Complaint Leg Swelling and Shortness of Breath    HPI Bryce [name] is a 53 y.o. male with PMH as noted below who presents with bilateral leg swelling, gradual onset over about the last week, and acutely worsened on the right.  The patient denies any rash.  He has had no fever.  He reports mild worsening shortness of breath with exertion, but denies chest pain.  Past Medical History:  Diagnosis Date  . Chronic systolic congestive heart failure ([place])   . Coronary artery disease   . GERD (gastroesophageal reflux disease)   . Hypertension   . Ischemic cardiomyopathy   . MI (myocardial infarction) ([place])   . Migraine     Patient Active Problem List   Diagnosis Date Noted  . Prediabetes 01/28/2019  . Erectile dysfunction 01/06/2019  . Numbness 01/06/2019  . History of migraine headaches 01/06/2019  . Atherosclerosis of native coronary artery of native heart with stable angina pectoris ([place]) 11/02/2018  . Acute renal failure (ARF) ([place]) 01/13/2018  . CAD (coronary artery disease) 02/20/2017  . Dehydration 02/20/2017  . STEMI (ST elevation myocardial infarction) ([place]) 02/02/2017  . AKI (acute kidney injury) ([place]) 12/15/2015  . Solitary pulmonary nodule 12/23/2014  . Chest pain 12/22/2014  . Hypertension 12/22/2014  . Hyperlipidemia 12/22/2014    Past Surgical History:  Procedure Laterality Date  . CARDIAC CATHETERIZATION N/A 12/28/2014   Procedure: Left Heart Cath and Coronary Angiography;  Surgeon: Yolonda Kida, MD;  Location: [place] [facility];  Service: Cardiovascular;  Laterality: N/A;  . CORONARY ANGIOPLASTY     Non-ST elevation MI status post stenting within bare-metal stent of the 75%  lesion of the LAD, 11/13/2010.   . CORONARY STENT INTERVENTION N/A 02/02/2017   Procedure: CORONARY/GRAFT ACUTE MI REVASCULARIZATION;  Surgeon: Yolonda Kida, MD;  Location: [place] [facility];  Service: Cardiovascular;  Laterality: N/A;  . LEFT HEART CATH AND CORONARY ANGIOGRAPHY N/A 02/02/2017   Procedure: LEFT HEART CATH AND CORONARY ANGIOGRAPHY;  Surgeon: Yolonda Kida, MD;  Location: [place] [facility];  Service: Cardiovascular;  Laterality: N/A;    Prior to Admission medications   Medication Sig Start Date End Date Taking? Authorizing Provider  aspirin EC 81 MG EC tablet Take 1 tablet (81 mg total) by mouth daily. 02/05/17   Epifanio Lesches, MD  aspirin-acetaminophen-caffeine (EXCEDRIN MIGRAINE) [phone] MG tablet Take 1 tablet by mouth daily as needed for headache. 01/06/19   Iloabachie, Chioma E, NP  clopidogrel (PLAVIX) 75 MG tablet TAKE ONE TABLET BY MOUTH EVERY DAY 09/17/18   Iloabachie, Chioma E, NP  cyclobenzaprine (FLEXERIL) 10 MG tablet Take 1 tablet (10 mg total) by mouth 3 (three) times daily as needed. 02/23/19   Sable Feil, PA-C  Docosanol (ABREVA) 10 % CREA Apply 1 application topically 2 (two) times daily. 01/15/18   Dustin Flock, MD  ezetimibe (ZETIA) 10 MG tablet Take 1 tablet (10 mg total) by mouth daily. 11/03/18 02/01/19  Minna Merritts, MD  gabapentin (NEURONTIN) 100 MG capsule Take 1 capsule (100 mg total) by mouth 3 (three) times daily. 03/30/19   Iloabachie, Chioma E, NP  lisinopril (PRINIVIL,ZESTRIL) 20 MG tablet TAKE ONE TABLET BY MOUTH EVERY DAY 09/17/18   Iloabachie, Chioma E, NP  meloxicam (MOBIC) 15 MG tablet Take 1 tablet (15 mg total) by mouth daily. 03/07/19   Cuthriell, Delorise Royals, PA-C  metoprolol tartrate (LOPRESSOR) 25 MG tablet TAKE ONE TABLET BY MOUTH 2 TIMES A DAY 01/06/19   Iloabachie, Chioma E, NP  nitroGLYCERIN (NITROSTAT) 0.4 MG SL tablet Place 1 tablet (0.4 mg total) under the tongue every 5 (five) minutes x 3 doses as needed for  chest pain. 02/04/17   Katha Hamming, MD  oxyCODONE-acetaminophen (PERCOCET/ROXICET) 5-325 MG tablet Take 1 tablet by mouth every 6 (six) hours as needed for severe pain. 03/07/19   Cuthriell, Delorise Royals, PA-C  pantoprazole (PROTONIX) 40 MG tablet Take 1 tablet (40 mg total) by mouth daily. 01/28/19   Iloabachie, Chioma E, NP  pravastatin (PRAVACHOL) 40 MG tablet Take 1 tablet (40 mg total) by mouth daily. 04/02/18   Iloabachie, Chioma E, NP    Allergies Atorvastatin  Family History  Problem Relation Age of Onset  . CAD Father   . Kidney disease Neg Hx   . Diabetes Mellitus II Neg Hx     Social History Social History   Tobacco Use  . Smoking status: Current Some Day Smoker    Packs/day: 1.00    Years: 5.00    Pack years: 5.00    Types: Cigarettes    Last attempt to quit: 01/05/2017    Years since quitting: 2.2  . Smokeless tobacco: Never Used  . Tobacco comment: states smokes occasionally 10-12-18  Substance Use Topics  . Alcohol use: Not Currently  . Drug use: Yes    Types: Marijuana    Review of Systems  Constitutional: No fever. Eyes: No redness. ENT: No sore throat. Cardiovascular: Denies chest pain. Respiratory: Positive for exertional shortness of breath. Gastrointestinal: No vomiting or diarrhea.  Genitourinary: Negative for dysuria.  Positive for frequency. Musculoskeletal: Negative for back pain.  Positive for leg swelling. Skin: Negative for rash. Neurological: Negative for focal weakness or numbness.   ____________________________________________   PHYSICAL EXAM:  VITAL SIGNS: ED Triage Vitals  Enc Vitals Group     BP 03/30/19 1117 (!) 145/110     Pulse Rate 03/30/19 1117 88     Resp 03/30/19 1117 18     Temp 03/30/19 1117 98.5 F (36.9 C)     Temp Source 03/30/19 1117 Oral     SpO2 03/30/19 1117 96 %     Weight 03/30/19 1118 260 lb (117.9 kg)     Height 03/30/19 1118  (1.727 m)     Head Circumference --      Peak Flow --      Pain  Score 03/30/19 1118 7     Pain Loc --      Pain Edu? --      Excl. in GC? --     Constitutional: Alert and oriented. Well appearing and in no acute distress. Eyes: Conjunctivae are normal.  Head: Atraumatic. Nose: No congestion/rhinnorhea. Mouth/Throat: Mucous membranes are moist.   Neck: Normal range of motion.  Cardiovascular: Normal rate, regular rhythm. Grossly normal heart sounds.  Good peripheral circulation. Respiratory: Normal respiratory effort.  No retractions. Lungs CTAB. Gastrointestinal: No distention.  Musculoskeletal: 1+ bilateral lower extremity edema, but right leg appears slightly larger than the left.  Extremities warm and well perfused.  No significant erythema, induration, or abnormal warmth. Neurologic:  Normal speech and language.  5/5 motor strength and intact sensation to bilateral lower extremities.  No gross focal neurologic deficits are appreciated.  Skin:  Skin is warm and dry. No rash noted. Psychiatric: Mood and affect are normal. Speech and behavior are normal.  ____________________________________________   LABS (all labs ordered are listed, but only abnormal results are displayed)  Labs Reviewed  BASIC METABOLIC PANEL - Abnormal; Notable for the following components:      Result Value   Glucose, Bld 122 (*)    All other components within normal limits  CBC  PROTIME-INR  BRAIN NATRIURETIC PEPTIDE  URINALYSIS, COMPLETE (UACMP) WITH MICROSCOPIC  TROPONIN I (HIGH SENSITIVITY)  TROPONIN I (HIGH SENSITIVITY)   ____________________________________________  EKG  ED ECG REPORT I, Dionne Bucy, the attending physician, personally viewed and interpreted this ECG.  Date: 03/30/2019 EKG Time: 1122 Rate: 93 Rhythm: normal sinus rhythm QRS Axis: normal Intervals: normal ST/T Wave abnormalities: Nonspecific lateral T wave abnormalities Narrative Interpretation: no evidence of acute ischemia: No significant change when compared to EKG of  01/13/2018  ____________________________________________  RADIOLOGY  CXR: No focal infiltrate or significant edema US venous LE bilateral: No acute DVT  ____________________________________________   PROCEDURES  Procedure(s) performed: No  Procedures  Critical Care performed: No ____________________________________________   INITIAL IMPRESSION / ASSESSMENT AND PLAN / ED COURSE  Pertinent labs & imaging results that were available during my care of the patient were reviewed by me and considered in my medical decision making (see chart for details).  53 year old male with PMH as noted above including history of CAD with prior MI and history of CHF presents with bilateral lower extremity swelling which has worsened over the last week, and is worse on the right.  He denies any injury.  He reports mild exertional shortness of breath but has no chest pain.  I reviewed the past medical records in Epic and confirmed the above PMH.  The patient was seen in the ED a few weeks ago with right elbow pain and was diagnosed with cellulitis.  He states that this has resolved with antibiotics.  I do not see recent prior ED visits or admissions for cardiac type symptoms.  On exam the patient is well-appearing.  His vital signs are normal.  He has no respiratory distress or significantly increased work of breathing.  He does have 1+ edema bilaterally and the right leg appears slightly larger.  There is no evidence of cellulitis.  Overall presentation is consistent with peripheral edema related to his CHF.  There is no clinical evidence for cellulitis.  Given the asymmetrical swelling, however I am somewhat concerned for possible DVT.  We will obtain an ultrasound.  If it is negative I will start the patient on a low-dose of diuretic and have him follow-up with his cardiologist.  ----------------------------------------- 3:30 PM on 03/30/2019 -----------------------------------------  The lab  work-up is reassuring.  Chest x-ray shows no evidence of pulmonary edema.  Ultrasound is negative for DVT.  At this time, the patient is stable for discharge home.  I discussed the results of the work-up with him.  We will start him on a low-dose of Lasix and have him follow-up with his cardiologist Dr. Mariah Milling in 1 to 2 weeks.  The patient agrees with the plan.  Return precautions given, and he expresses understanding.  ____________________________________________   FINAL CLINICAL IMPRESSION(S) / ED DIAGNOSES  Final diagnoses:  Leg edema      NEW MEDICATIONS STARTED DURING THIS VISIT:  New Prescriptions   No medications on file     Note:  This document was prepared using Dragon voice recognition software and may include  unintentional dictation errors.    Dionne BucySiadecki, Garlan Drewes, MD 03/30/19 1531

## 2019-03-30 NOTE — ED Notes (Addendum)
PT reports increasing shortness of breath and bilateral leg swelling x 1 week. Pt states it has gotten worse over the past two days, he states that when he can't lay down flat without feeling like he can't breathe so has had to sleep sitting up. Pt also states his family pointed out to him how swollen he is looking. Reports a 20lb weight gain in two weeks. Pt denies chest pain. Does report a headache at this time 4/10.  Bilateral pitting edema present, right leg 2+ pitting edema and left leg 1+ pitting edema.  Pt reports having a heart attack in march and hasn't bounced back like he has from his 2 previous heart attacks.

## 2019-03-30 NOTE — Discharge Instructions (Signed)
Follow-up with Dr. Rockey Situ within the next 1 to 2 weeks.  Take the Lasix once daily over the next couple of weeks until you follow-up.  Return to the ER immediately for new, worsening, or persistent leg swelling or pain, redness or rash, fevers, shortness of breath, weakness or lightheadedness, severe chest pain, or any other new or worsening symptoms that concern you.

## 2019-03-31 ENCOUNTER — Telehealth: Payer: Self-pay | Admitting: Cardiovascular Disease

## 2019-03-31 NOTE — Telephone Encounter (Signed)
Pt c/o swelling: STAT is pt has developed SOB within 24 hours  1) How much weight have you gained and in what time span? 20 pounds in 2 weeks  2) If swelling, where is the swelling located? Both legs, right is more than left  3) Are you currently taking a fluid pill? Yes, started last night  4) Are you currently SOB? yes  5) Do you have a log of your daily weights (if so, list)? 244 2 weeks ago, yesterday 266  Have you gained 3 pounds in a day or 5 pounds in a week? Yes  6) Have you traveled recently? no  Patient was seen at [facility] ED yesterday

## 2019-03-31 NOTE — Telephone Encounter (Signed)
Call to patient to discuss c/o SOB and weight gain. No answer. LMTCB

## 2019-04-01 ENCOUNTER — Other Ambulatory Visit: Payer: Self-pay

## 2019-04-01 ENCOUNTER — Ambulatory Visit: Payer: Medicaid Other | Admitting: Gerontology

## 2019-04-01 ENCOUNTER — Encounter: Payer: Self-pay | Admitting: Gerontology

## 2019-04-01 VITALS — BP 135/86 | HR 82 | Temp 96.6°F | Ht 68.0 in | Wt 257.0 lb

## 2019-04-01 DIAGNOSIS — I509 Heart failure, unspecified: Secondary | ICD-10-CM | POA: Insufficient documentation

## 2019-04-01 DIAGNOSIS — E785 Hyperlipidemia, unspecified: Secondary | ICD-10-CM

## 2019-04-01 DIAGNOSIS — R7303 Prediabetes: Secondary | ICD-10-CM

## 2019-04-01 DIAGNOSIS — E538 Deficiency of other specified B group vitamins: Secondary | ICD-10-CM | POA: Insufficient documentation

## 2019-04-01 DIAGNOSIS — R600 Localized edema: Secondary | ICD-10-CM | POA: Insufficient documentation

## 2019-04-01 DIAGNOSIS — I10 Essential (primary) hypertension: Secondary | ICD-10-CM

## 2019-04-01 DIAGNOSIS — M79601 Pain in right arm: Secondary | ICD-10-CM

## 2019-04-01 MED ORDER — VITAMIN B 12 100 MCG PO LOZG: 1000.0000 ug | LOZENGE | Freq: Every day | ORAL | 3 refills | Status: DC

## 2019-04-01 MED ORDER — LISINOPRIL 20 MG PO TABS: 20.0000 mg | ORAL_TABLET | Freq: Every day | ORAL | 1 refills | Status: DC

## 2019-04-01 MED ORDER — BLOOD PRESSURE KIT: 1.0000 | PACK | Freq: Every day | 0 refills | Status: AC

## 2019-04-01 MED ORDER — METOPROLOL TARTRATE 25 MG PO TABS: ORAL_TABLET | ORAL | 2 refills | Status: DC

## 2019-04-01 NOTE — Patient Instructions (Signed)
Heart Failure, Self Care Heart failure is a serious condition. This sheet explains things you need to do to take care of yourself at home. To help you stay as healthy as possible, you may be asked to change your diet, take certain medicines, and make other changes in your life. Your doctor may also give you more specific instructions. If you have problems or questions, call your doctor. What are the risks? Having heart failure makes it more likely for you to have some problems. These problems can get worse if you do not take good care of yourself. Problems may include:  Blood clotting problems. This may cause a stroke.  Damage to the kidneys, liver, or lungs.  Abnormal heart rhythms. Supplies needed:  Scale for weighing yourself.  Blood pressure monitor.  Notebook.  Medicines. How to care for yourself when you have heart failure Medicines Take over-the-counter and prescription medicines only as told by your doctor. Take your medicines every day.  Do not stop taking your medicine unless your doctor tells you to do so.  Do not skip any medicines.  Get your prescriptions refilled before you run out of medicine. This is important. Eating and drinking   Eat heart-healthy foods. Talk with a diet specialist (dietitian) to create an eating plan.  Choose foods that: ? Have no trans fat. ? Are low in saturated fat and cholesterol.  Choose healthy foods, such as: ? Fresh or frozen fruits and vegetables. ? Fish. ? Low-fat (lean) meats. ? Legumes, such as beans, peas, and lentils. ? Fat-free or low-fat dairy products. ? Whole-grain foods. ? High-fiber foods.  Limit salt (sodium) if told by your doctor. Ask your diet specialist to tell you which seasonings are healthy for your heart.  Cook in healthy ways instead of frying. Healthy ways of cooking include roasting, grilling, broiling, baking, poaching, steaming, and stir-frying.  Limit how much fluid you drink, if told by your  doctor. Alcohol use  Do not drink alcohol if: ? Your doctor tells you not to drink. ? Your heart was damaged by alcohol, or you have very bad heart failure. ? You are pregnant, may be pregnant, or are planning to become pregnant.  If you drink alcohol: ? Limit how much you use to:  0-1 drink a day for women.  0-2 drinks a day for men. ? Be aware of how much alcohol is in your drink. In the [place] one drink equals one 12 oz bottle of beer (355 mL), one 5 oz glass of wine (148 mL), or one 1 oz glass of hard liquor (44 mL). Lifestyle   Do not use any products that contain nicotine or tobacco, such as cigarettes, e-cigarettes, and chewing tobacco. If you need help quitting, ask your doctor. ? Do not use nicotine gum or patches before talking to your doctor.  Do not use illegal drugs.  Lose weight if told by your doctor.  Do physical activity if told by your doctor. Talk to your doctor before you begin an exercise if: ? You are an older adult. ? You have very bad heart failure.  Learn to manage stress. If you need help, ask your doctor.  Get rehab (rehabilitation) to help you stay independent and to help with your quality of life.  Plan time to rest when you get tired. Check weight and blood pressure   Weigh yourself every day. This will help you to know if fluid is building up in your body. ? Weigh yourself every morning   after you pee (urinate) and before you eat breakfast. ? Wear the same amount of clothing each time. ? Write down your daily weight. Give your record to your doctor.  Check and write down your blood pressure as told by your doctor.  Check your pulse as told by your doctor. Dealing with very hot and very cold weather  If it is very hot: ? Avoid activities that take a lot of energy. ? Use air conditioning or fans, or find a cooler place. ? Avoid caffeine and alcohol. ? Wear clothing that is loose-fitting, lightweight, and light-colored.  If it is very  cold: ? Avoid activities that take a lot of energy. ? Layer your clothes. ? Wear mittens or gloves, a hat, and a scarf when you go outside. ? Avoid alcohol. Follow these instructions at home:  Stay up to date with shots (vaccines). Get pneumococcal and flu (influenza) shots.  Keep all follow-up visits as told by your doctor. This is important. Contact a doctor if:  You gain weight quickly.  You have increasing shortness of breath.  You cannot do your normal activities.  You get tired easily.  You cough a lot.  You don't feel like eating or feel like you may vomit (nauseous).  You become puffy (swell) in your hands, feet, ankles, or belly (abdomen).  You cannot sleep well because it is hard to breathe.  You feel like your heart is beating fast (palpitations).  You get dizzy when you stand up. Get help right away if:  You have trouble breathing.  You or someone else notices a change in your behavior, such as having trouble staying awake.  You have chest pain or discomfort.  You pass out (faint). These symptoms may be an emergency. Do not wait to see if the symptoms will go away. Get medical help right away. Call your local emergency services (911 in the [place] Do not drive yourself to the hospital. Summary  Heart failure is a serious condition. To care for yourself, you may have to change your diet, take medicines, and make other lifestyle changes.  Take your medicines every day. Do not stop taking them unless your doctor tells you to do so.  Eat heart-healthy foods, such as fresh or frozen fruits and vegetables, fish, lean meats, legumes, fat-free or low-fat dairy products, and whole-grain or high-fiber foods.  Ask your doctor if you can drink alcohol. You may have to stop alcohol use if you have very bad heart failure.  Contact your doctor if you gain weight quickly or feel that your heart is beating too fast. Get help right away if you pass out, or have chest pain  or trouble breathing. This information is not intended to replace advice given to you by your health care provider. Make sure you discuss any questions you have with your health care provider. Document Released: 09/02/2018 Document Revised: 09/01/2018 Document Reviewed: 09/02/2018 Elsevier Patient Education  2020 [employer] DASH Eating Plan DASH stands for "Dietary Approaches to Stop Hypertension." The DASH eating plan is a healthy eating plan that has been shown to reduce high blood pressure (hypertension). It may also reduce your risk for type 2 diabetes, heart disease, and stroke. The DASH eating plan may also help with weight loss. What are tips for following this plan?  General guidelines  Avoid eating more than 2,300 mg (milligrams) of salt (sodium) a day. If you have hypertension, you may need to reduce your sodium intake to 1,500 mg a  day.  Limit alcohol intake to no more than 1 drink a day for nonpregnant women and 2 drinks a day for men. One drink equals 12 oz of beer, 5 oz of wine, or 1 oz of hard liquor.  Work with your health care provider to maintain a healthy body weight or to lose weight. Ask what an ideal weight is for you.  Get at least 30 minutes of exercise that causes your heart to beat faster (aerobic exercise) most days of the week. Activities may include walking, swimming, or biking.  Work with your health care provider or diet and nutrition specialist (dietitian) to adjust your eating plan to your individual calorie needs. Reading food labels   Check food labels for the amount of sodium per serving. Choose foods with less than 5 percent of the Daily Value of sodium. Generally, foods with less than 300 mg of sodium per serving fit into this eating plan.  To find whole grains, look for the word "whole" as the first word in the ingredient list. Shopping  Buy products labeled as "low-sodium" or "no salt added."  Buy fresh foods. Avoid canned foods and premade or  frozen meals. Cooking  Avoid adding salt when cooking. Use salt-free seasonings or herbs instead of table salt or sea salt. Check with your health care provider or pharmacist before using salt substitutes.  Do not fry foods. Cook foods using healthy methods such as baking, boiling, grilling, and broiling instead.  Cook with heart-healthy oils, such as olive, canola, soybean, or sunflower oil. Meal planning  Eat a balanced diet that includes: ? 5 or more servings of fruits and vegetables each day. At each meal, try to fill half of your plate with fruits and vegetables. ? Up to 6-8 servings of whole grains each day. ? Less than 6 oz of lean meat, poultry, or fish each day. A 3-oz serving of meat is about the same size as a deck of cards. One egg equals 1 oz. ? 2 servings of low-fat dairy each day. ? A serving of nuts, seeds, or beans 5 times each week. ? Heart-healthy fats. Healthy fats called Omega-3 fatty acids are found in foods such as flaxseeds and coldwater fish, like sardines, salmon, and mackerel.  Limit how much you eat of the following: ? Canned or prepackaged foods. ? Food that is high in trans fat, such as fried foods. ? Food that is high in saturated fat, such as fatty meat. ? Sweets, desserts, sugary drinks, and other foods with added sugar. ? Full-fat dairy products.  Do not salt foods before eating.  Try to eat at least 2 vegetarian meals each week.  Eat more home-cooked food and less restaurant, buffet, and fast food.  When eating at a restaurant, ask that your food be prepared with less salt or no salt, if possible. What foods are recommended? The items listed may not be a complete list. Talk with your dietitian about what dietary choices are best for you. Grains Whole-grain or whole-wheat bread. Whole-grain or whole-wheat pasta. Brown rice. Orpah Cobb. Bulgur. Whole-grain and low-sodium cereals. Pita bread. Low-fat, low-sodium crackers. Whole-wheat flour  tortillas. Vegetables Fresh or frozen vegetables (raw, steamed, roasted, or grilled). Low-sodium or reduced-sodium tomato and vegetable juice. Low-sodium or reduced-sodium tomato sauce and tomato paste. Low-sodium or reduced-sodium canned vegetables. Fruits All fresh, dried, or frozen fruit. Canned fruit in natural juice (without added sugar). Meat and other protein foods Skinless chicken or [place]. Ground chicken or [place]. Pork  with fat trimmed off. Fish and seafood. Egg whites. Dried beans, peas, or lentils. Unsalted nuts, nut butters, and seeds. Unsalted canned beans. Lean cuts of beef with fat trimmed off. Low-sodium, lean deli meat. Dairy Low-fat (1%) or fat-free (skim) milk. Fat-free, low-fat, or reduced-fat cheeses. Nonfat, low-sodium ricotta or cottage cheese. Low-fat or nonfat yogurt. Low-fat, low-sodium cheese. Fats and oils Soft margarine without trans fats. Vegetable oil. Low-fat, reduced-fat, or light mayonnaise and salad dressings (reduced-sodium). Canola, safflower, olive, soybean, and sunflower oils. Avocado. Seasoning and other foods Herbs. Spices. Seasoning mixes without salt. Unsalted popcorn and pretzels. Fat-free sweets. What foods are not recommended? The items listed may not be a complete list. Talk with your dietitian about what dietary choices are best for you. Grains Baked goods made with fat, such as croissants, muffins, or some breads. Dry pasta or rice meal packs. Vegetables Creamed or fried vegetables. Vegetables in a cheese sauce. Regular canned vegetables (not low-sodium or reduced-sodium). Regular canned tomato sauce and paste (not low-sodium or reduced-sodium). Regular tomato and vegetable juice (not low-sodium or reduced-sodium). Angie Fava. Olives. Fruits Canned fruit in a light or heavy syrup. Fried fruit. Fruit in cream or butter sauce. Meat and other protein foods Fatty cuts of meat. Ribs. Fried meat. Berniece Salines. Sausage. Bologna and other processed lunch meats.  Salami. Fatback. Hotdogs. Bratwurst. Salted nuts and seeds. Canned beans with added salt. Canned or smoked fish. Whole eggs or egg yolks. Chicken or [place] with skin. Dairy Whole or 2% milk, cream, and half-and-half. Whole or full-fat cream cheese. Whole-fat or sweetened yogurt. Full-fat cheese. Nondairy creamers. Whipped toppings. Processed cheese and cheese spreads. Fats and oils Butter. Stick margarine. Lard. Shortening. Ghee. Bacon fat. Tropical oils, such as coconut, palm kernel, or palm oil. Seasoning and other foods Salted popcorn and pretzels. Onion salt, garlic salt, seasoned salt, table salt, and sea salt. Worcestershire sauce. Tartar sauce. Barbecue sauce. Teriyaki sauce. Soy sauce, including reduced-sodium. Steak sauce. Canned and packaged gravies. Fish sauce. Oyster sauce. Cocktail sauce. Horseradish that you find on the shelf. Ketchup. Mustard. Meat flavorings and tenderizers. Bouillon cubes. Hot sauce and Tabasco sauce. Premade or packaged marinades. Premade or packaged taco seasonings. Relishes. Regular salad dressings. Where to find more information:  National Heart, Lung, and [place]: https://wilson-eaton.com/  American Heart Association: www.heart.org Summary  The DASH eating plan is a healthy eating plan that has been shown to reduce high blood pressure (hypertension). It may also reduce your risk for type 2 diabetes, heart disease, and stroke.  With the DASH eating plan, you should limit salt (sodium) intake to 2,300 mg a day. If you have hypertension, you may need to reduce your sodium intake to 1,500 mg a day.  When on the DASH eating plan, aim to eat more fresh fruits and vegetables, whole grains, lean proteins, low-fat dairy, and heart-healthy fats.  Work with your health care provider or diet and nutrition specialist (dietitian) to adjust your eating plan to your individual calorie needs. This information is not intended to replace advice given to you by your health  care provider. Make sure you discuss any questions you have with your health care provider. Document Released: 05/09/2011 Document Revised: 05/02/2017 Document Reviewed: 05/13/2016 Elsevier Patient Education  2020 [employer].

## 2019-04-01 NOTE — Progress Notes (Signed)
Established Patient Office Visit  Subjective:  Patient ID: Bryce [name], male    DOB: [birth year]  Age: 53 y.o. MRN: [id number]  CC:  Chief Complaint  Patient presents with  . Cellulitis    Right arm, started around 03/13/2019    HPI [name] presents for presents for follow up of Cellulitis and bilateral lower extremity edema. He was seen at the ED on 03/30/2019 for Leg swelling and shortness of breath, Chest x ray was done and showed no active cardiopulmonary disease, and bilateral lower extremity ultrasound was negative for deep venous thrombosis in either extremity. He was started on Furosemide 20 mg daily and he states that the swelling to his legs has decreased 70%. He was also evaluated at the ED on 03/17/2019 for  Right elbow injury and was diagnosed with cellulitis and he finished the course of antibiotics. He states that he still experiences mild pain with touching the area overlying the olecranon and the swelling has decreased 75%. Xray to right elbow was done during his ED visit and it showed no fracture or dislocation of the elbow, joint spaces are preserved and no elbow effusion. There is soft tissue edema overlying the olecranon. He states that his feeling better, denies chest pain, but experiences mild shortness of breath when walking more than 2 blocks. He continues to smoke 2 cigarettes daily and admits the desire to quit. He states that he's compliant with his medications, adheres to healthy diet and will start exercising, otherwise he offers no further complaint.   Past Medical History:  Diagnosis Date  . Chronic systolic congestive heart failure ([place])   . Coronary artery disease   . GERD (gastroesophageal reflux disease)   . Hypertension   . Ischemic cardiomyopathy   . MI (myocardial infarction) ([place])   . Migraine     Past Surgical History:  Procedure Laterality Date  . CARDIAC CATHETERIZATION N/A 12/28/2014   Procedure: Left Heart Cath and Coronary  Angiography;  Surgeon: Yolonda Kida, MD;  Location: [facility];  Service: Cardiovascular;  Laterality: N/A;  . CORONARY ANGIOPLASTY     Non-ST elevation MI status post stenting within bare-metal stent of the 75% lesion of the LAD, 11/13/2010.   . CORONARY STENT INTERVENTION N/A 02/02/2017   Procedure: CORONARY/GRAFT ACUTE MI REVASCULARIZATION;  Surgeon: Yolonda Kida, MD;  Location: [facility];  Service: Cardiovascular;  Laterality: N/A;  . LEFT HEART CATH AND CORONARY ANGIOGRAPHY N/A 02/02/2017   Procedure: LEFT HEART CATH AND CORONARY ANGIOGRAPHY;  Surgeon: Yolonda Kida, MD;  Location: [facility];  Service: Cardiovascular;  Laterality: N/A;    Family History  Problem Relation Age of Onset  . CAD Father   . Kidney disease Neg Hx   . Diabetes Mellitus II Neg Hx     Social History   Socioeconomic History  . Marital status: Single    Spouse name: Not on file  . Number of children: Not on file  . Years of education: Not on file  . Highest education level: Not on file  Occupational History  . Occupation: Proofreader  Social Needs  . Financial resource strain: Not on file  . Food insecurity    Worry: Not on file    Inability: Not on file  . Transportation needs    Medical: Not on file    Non-medical: Not on file  Tobacco Use  . Smoking status: Current Some Day Smoker  Packs/day: 1.00    Years: 5.00    Pack years: 5.00    Types: Cigarettes    Last attempt to quit: 01/05/2017    Years since quitting: 2.2  . Smokeless tobacco: Never Used  . Tobacco comment: states smokes occasionally 10-12-18  Substance and Sexual Activity  . Alcohol use: Not Currently  . Drug use: Yes    Types: Marijuana  . Sexual activity: Yes  Lifestyle  . Physical activity    Days per week: Not on file    Minutes per session: Not on file  . Stress: Not on file  Relationships  . Social connections    Talks on phone: Not on file    Gets together: Not on  file    Attends religious service: Not on file    Active member of club or organization: Not on file    Attends meetings of clubs or organizations: Not on file    Relationship status: Not on file  . Intimate partner violence    Fear of current or ex partner: Not on file    Emotionally abused: Not on file    Physically abused: Not on file    Forced sexual activity: Not on file  Other Topics Concern  . Not on file  Social History Narrative  . Not on file    Outpatient Medications Prior to Visit  Medication Sig Dispense Refill  . aspirin EC 81 MG EC tablet Take 1 tablet (81 mg total) by mouth daily. 30 tablet 0  . aspirin-acetaminophen-caffeine (EXCEDRIN MIGRAINE) 250-250-65 MG tablet Take 1 tablet by mouth daily as needed for headache. 30 tablet 0  . clopidogrel (PLAVIX) 75 MG tablet TAKE ONE TABLET BY MOUTH EVERY DAY 90 tablet 0  . ezetimibe (ZETIA) 10 MG tablet Take 1 tablet (10 mg total) by mouth daily. 90 tablet 3  . furosemide (LASIX) 20 MG tablet Take 1 tablet (20 mg total) by mouth daily for 14 days. 14 tablet 0  . gabapentin (NEURONTIN) 100 MG capsule Take 1 capsule (100 mg total) by mouth 3 (three) times daily. 90 capsule 0  . meloxicam (MOBIC) 15 MG tablet Take 1 tablet (15 mg total) by mouth daily. 30 tablet 0  . nitroGLYCERIN (NITROSTAT) 0.4 MG SL tablet Place 1 tablet (0.4 mg total) under the tongue every 5 (five) minutes x 3 doses as needed for chest pain. 30 tablet 0  . pantoprazole (PROTONIX) 40 MG tablet Take 1 tablet (40 mg total) by mouth daily. 30 tablet 0  . pravastatin (PRAVACHOL) 40 MG tablet Take 1 tablet (40 mg total) by mouth daily. 90 tablet 3  . lisinopril (PRINIVIL,ZESTRIL) 20 MG tablet TAKE ONE TABLET BY MOUTH EVERY DAY 90 tablet 0  . metoprolol tartrate (LOPRESSOR) 25 MG tablet TAKE ONE TABLET BY MOUTH 2 TIMES A DAY 60 tablet 0  . cyclobenzaprine (FLEXERIL) 10 MG tablet Take 1 tablet (10 mg total) by mouth 3 (three) times daily as needed. (Patient not  taking: Reported on 04/01/2019) 15 tablet 0  . Docosanol (ABREVA) 10 % CREA Apply 1 application topically 2 (two) times daily. 1 Tube 0  . oxyCODONE-acetaminophen (PERCOCET/ROXICET) 5-325 MG tablet Take 1 tablet by mouth every 6 (six) hours as needed for severe pain. 12 tablet 0   No facility-administered medications prior to visit.     Allergies  Allergen Reactions  . Atorvastatin     Myalgias    ROS Review of Systems  Constitutional: Negative.   Respiratory: Positive for   shortness of breath.   Cardiovascular: Positive for leg swelling.  Genitourinary: Negative.   Musculoskeletal: Negative for arthralgias and joint swelling.  Skin: Negative.   Neurological: Negative.   Psychiatric/Behavioral: Negative.       Objective:    Physical Exam  Constitutional: He is oriented to person, place, and time. He appears well-developed and well-nourished.  Obese  HENT:  Head: Normocephalic.  Eyes: Pupils are equal, round, and reactive to light. EOM are normal.  Cardiovascular: Normal rate and regular rhythm.  Pulmonary/Chest: Effort normal and breath sounds normal.  Abdominal: Soft. Bowel sounds are normal.  Central obesity  Musculoskeletal:        General: Tenderness (on palpation to area overlying the olecranon.) and edema (+1 right leg, trace to left leg. +1 to area overlying the olecranon) present.  Neurological: He is alert and oriented to person, place, and time.  Skin: Skin is warm and dry.  Psychiatric: He has a normal mood and affect. His behavior is normal. Judgment and thought content normal.    BP 135/86 (BP Location: Right Arm, Patient Position: Sitting, Cuff Size: Normal)   Pulse 82   Temp (!) 96.6 F (35.9 C)   Ht 5' 8" (1.727 m)   Wt 257 lb (116.6 kg)   SpO2 97%   BMI 39.08 kg/m  Wt Readings from Last 3 Encounters:  04/01/19 257 lb (116.6 kg)  03/30/19 260 lb (117.9 kg)  03/17/19 255 lb (115.7 kg)   He was encouraged to continue on his weight loss  regimen.  Health Maintenance Due  Topic Date Due  . TETANUS/TDAP  10/11/1983  . COLONOSCOPY  10/11/2014    There are no preventive care reminders to display for this patient.  Lab Results  Component Value Date   TSH 1.624 12/22/2014   Lab Results  Component Value Date   WBC 6.0 03/30/2019   HGB 14.4 03/30/2019   HCT 43.4 03/30/2019   MCV 94.1 03/30/2019   PLT 214 03/30/2019   Lab Results  Component Value Date   NA 137 03/30/2019   K 3.9 03/30/2019   CO2 23 03/30/2019   GLUCOSE 122 (H) 03/30/2019   BUN 17 03/30/2019   CREATININE 0.80 03/30/2019   BILITOT 0.9 03/17/2019   ALKPHOS 66 03/17/2019   AST 19 03/17/2019   ALT 28 03/17/2019   PROT 7.4 03/17/2019   ALBUMIN 4.1 03/17/2019   CALCIUM 9.0 03/30/2019   ANIONGAP 9 03/30/2019   Lab Results  Component Value Date   CHOL 189 10/21/2018   Lab Results  Component Value Date   HDL 45 10/21/2018   Lab Results  Component Value Date   LDLCALC 120 (H) 10/21/2018   Lab Results  Component Value Date   TRIG 120 10/21/2018   Lab Results  Component Value Date   CHOLHDL 4.2 10/21/2018   Lab Results  Component Value Date   HGBA1C 5.8 (H) 01/27/2019      Assessment & Plan:    1. Essential hypertension - His blood pressure was rechecked and it was 135/86. His goal is < 140/90. He was advised to continue on -Low salt DASH diet -Take medications regularly on time -Exercise regularly as tolerated -Check blood pressure at least once daily at home ,record -Goal is less than 140/90 and normal blood pressure is 120/80 - lisinopril (ZESTRIL) 20 MG tablet; Take 1 tablet (20 mg total) by mouth daily.  Dispense: 90 tablet; Refill: 1 - metoprolol tartrate (LOPRESSOR) 25 MG tablet; TAKE ONE TABLET   BY MOUTH 2 TIMES A DAY  Dispense: 60 tablet; Refill: 2 - Basic Metabolic Panel (BMET); Future - Blood Pressure KIT; 1 kit by Does not apply route daily.  Dispense: 1 kit; Refill: 0  2. Congestive heart failure, unspecified HF  chronicity, unspecified heart failure type ([place]) - He was advised to continue on his medication, weigh self daily and notify clinic for excess weight gain, or worsening shortness of breath. He was advised to complete the charity care application for - AMB referral to CHF clinic  3. Vitamin B12 deficiency - He will start on otc vitamin B 12. - Cyanocobalamin (VITAMIN B 12) 100 MCG LOZG; Take 1,000 mcg by mouth daily.  Dispense: 30 lozenge; Refill: 3  4. Right arm pain - He was advised to take tylenol 650 mg every 12 hours for arm pain.  5. Hyperlipidemia, unspecified hyperlipidemia type - He will continue on current treatment regimen and was advised to continue  -Low fat Diet, like low fat dairy products eg skimmed milk -Avoid any fried food -Regular exercise/walk -Goal for Total Cholesterol is less than 200 -Goal for bad cholesterol LDL is less than 70 --Goal for Good cholesterol HDL is more than 45 -Goal for Triglyceride is less than 150 - Lipid panel; Future  6. Prediabetes - He defers Metformin therapy and was advised to continue on low carb/ non concentrated sweet diet. - HgB A1c; Future   7. Bilateral lower extremity edema - He was advised to take 20 mg Furosemide daily,  -Continue on DASH diet and  -Elevate legs while sitting down.    Follow-up: Return in about 6 weeks (around 05/13/2019), or if symptoms worsen or fail to improve.    Maisyn Nouri Jerold Coombe, NP

## 2019-04-01 NOTE — Telephone Encounter (Signed)
Patient returning call.

## 2019-04-02 NOTE — Progress Notes (Deleted)
Cardiology Office Note    Date:  04/02/2019   ID:  Bryce, [name] [birth year], MRN [id number]  PCP:  Rolm Gala, NP  Cardiologist:  Julien Nordmann, MD  Electrophysiologist:  None   Chief Complaint: ED follow up  History of Present Illness:   Bryce [name] is a 53 y.o. male with history of CAD s/p multiple PCIs as below, systolic dysfunction, HTN, HLD, and ongoing tobacco abuse who presents for ***.   Patient was previously followed by [facility]. He was admitted in 11/2010 with a NSTEMI and underwent PCI/BMS to the LAD. He was readmitted in 02/2017 with a lateral ST elevation MI and underwent successful PCI/DES to the mid to distal LCx on 02/02/2017. Echo at that time showed an EF of 45-50%. He was readmitted in 03/2017 with dehydration/hypotension after working outdoors [employer] in the hot weather. He established care with [facility] in 11/2018. At that time, he reported being seen in the ED in 08/2018 for swelling and fatigue. He was noted to have elevated BP leading his metoprolol to be increased. He also had a new LBBB. Initial troponin was normal and not trended. Given his prior symptoms and new LBBB, he underwent Lexiscan Myoview in 11/2018 that showed no significant ischemia, a fixed apical and periapical defect with concern for over-processing, normal wall motion with an EF of 48% (felt to be reduced secondary to GI uptake artifact), EKG with baseline LBBB. Overall, this was a low risk scan.   He has been seen in the ED 4 times in 03/2019, with the first three visit being related to trauma from a fall in which he hit his right side leading to an avulsion fracture of the right ankle as well as olecranon bursitis at the 2nd visit and right upper extremity cellulitis on the third visit respectively. More recently, he was seen in the ED on 03/30/2019 with complaints of SOB and lower extremity edema that had been gradual in onset over the prior week and was  acutely worse on the right. Ultrasound was negative for lower extremity DVT bilaterally. HS-Tn of 5 with a delta of 4. BNP 10, CBC unremarkable, K+ 3.9, BUN 17, SCr 0.8. CXR without acute cardiopulmonary disease. EKG showed sinus rhythm, 93 bpm, possible prior anterior and inferior infarct, poor R wave progression along the precordial leads, nonspecific st/t changes. He was given a diuretic in the ED and advised to follow up as an outpatient. He called our office on 10/28 and reported a weight of 242 pounds ~ 3 weeks prior and a weight of 266 pounds on 10/27, as well as abdominal swelling. He reported good UOP, though persistent swelling. He felt like his symptoms began with the above mentioned fall earlier in 03/2019.   He was seen by his PCP on 10/29 with a documented weight of 257 pounds and advised to continue Lasix 20 mg daily/elevate the legs.  ***    Labs: 03/2019 - HGB 14.4, PLT 214, K+ 3.9, BUN 17, SCr 0.8, albumin 4.1, AST/ALT normal 01/2019 - A1c 5.8 10/2018 - TC 189, TG 120, HDL 45, LDL 120   Past Medical History:  Diagnosis Date   Chronic systolic congestive heart failure ([facility])    Coronary artery disease    GERD (gastroesophageal reflux disease)    Hypertension    Ischemic cardiomyopathy    MI (myocardial infarction) ([facility])    Migraine     Past Surgical History:  Procedure Laterality Date  CARDIAC CATHETERIZATION N/A 12/28/2014   Procedure: Left Heart Cath and Coronary Angiography;  Surgeon: Alwyn Peawayne D Callwood, MD;  Location: [facility];  Service: Cardiovascular;  Laterality: N/A;   CORONARY ANGIOPLASTY     Non-ST elevation MI status post stenting within bare-metal stent of the 75% lesion of the LAD, 11/13/2010.    CORONARY STENT INTERVENTION N/A 02/02/2017   Procedure: CORONARY/GRAFT ACUTE MI REVASCULARIZATION;  Surgeon: Alwyn Peaallwood, Dwayne D, MD;  Location: [facility];  Service: Cardiovascular;  Laterality: N/A;   LEFT HEART CATH AND CORONARY  ANGIOGRAPHY N/A 02/02/2017   Procedure: LEFT HEART CATH AND CORONARY ANGIOGRAPHY;  Surgeon: Alwyn Peaallwood, Dwayne D, MD;  Location: [facility];  Service: Cardiovascular;  Laterality: N/A;    Current Medications: No outpatient medications have been marked as taking for the 04/05/19 encounter (Appointment) with Sondra Bargesunn, Veda Arrellano M, PA-C.    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  Social Needs   Financial resource strain: Not on file   Food insecurity    Worry: Not on file    Inability: Not on file   Transportation needs    Medical: Not on file    Non-medical: Not on file  Tobacco Use   Smoking status: Current Some Day Smoker    Packs/day: 1.00    Years: 5.00    Pack years: 5.00    Types: Cigarettes    Last attempt to quit: 01/05/2017    Years since quitting: 2.2   Smokeless tobacco: Never Used   Tobacco comment: states smokes occasionally 10-12-18  Substance and Sexual Activity   Alcohol use: Not Currently   Drug use: Yes    Types: Marijuana   Sexual activity: Yes  Lifestyle   Physical activity    Days per week: Not on file    Minutes per session: Not on file   Stress: Not on file  Relationships   Social connections    Talks on phone: Not on file    Gets together: Not on file    Attends religious service: Not on file    Active member of club or organization: Not on file    Attends meetings of clubs or organizations: Not on file    Relationship status: Not on file  Other Topics Concern   Not on file  Social History Narrative   Not on file     Family History:  The patient's family history includes CAD in his father. There is no history of Kidney disease or Diabetes Mellitus II.  ROS:    ROS   EKGs/Labs/Other Studies Reviewed:    Studies reviewed were summarized above. The additional studies were reviewed today: As above.   EKG:  EKG is ordered today.  The EKG ordered today demonstrates ***  Recent Labs: 03/17/2019: ALT 28 03/30/2019: B Natriuretic Peptide 10.0; BUN 17; Creatinine, Ser 0.80; Hemoglobin 14.4; Platelets 214; Potassium 3.9; Sodium 137  Recent Lipid Panel    Component Value Date/Time   CHOL 189 10/21/2018 1144   TRIG 120 10/21/2018 1144   HDL 45 10/21/2018 1144   CHOLHDL 4.2 10/21/2018 1144   CHOLHDL 5.4 02/03/2017 0414   VLDL 36 02/03/2017 0414   LDLCALC 120 (  H) 10/21/2018 1144    PHYSICAL EXAM:    VS:  There were no vitals taken for this visit.  BMI: There is no height or weight on file to calculate BMI.  Physical Exam  Wt Readings from Last 3 Encounters:  04/01/19 257 lb (116.6 kg)  03/30/19 260 lb (117.9 kg)  03/17/19 255 lb (115.7 kg)     ASSESSMENT & PLAN:   1. ***  Disposition: F/u with Dr. Rockey Situ or an APP in ***.   Medication Adjustments/Labs and Tests Ordered: Current medicines are reviewed at length with the patient today.  Concerns regarding medicines are outlined above. Medication changes, Labs and Tests ordered today are summarized above and listed in the Patient Instructions accessible in Encounters.   Signed, Christell Faith, PA-C 04/02/2019 12:34 PM     [place] [address] [place] [address] [place], [place] [address] [phone]

## 2019-04-02 NOTE — Telephone Encounter (Signed)
I spoke with the patient.  He was in the ER on 10/27 with bilateral lower extremity swelling (right > left). He states he feel ~ 3 weeks ago and tore some ligaments in his right leg and bruised his right elbow. He has been treated for cellulitis in the right leg.  He advised his weight ws 242 lbs ~ 3 weeks ago. He got up to 266 lbs just prior to going to the ER on 10/27. In the ER his weight was 260 lbs He was discharge on lasix 20 mg once daily. Weight- 10/28 (he did not weigh)/ weight 10/29- 255 lbs.  He is still concerned as his legs are still swollen although he is urinating on lasix 20 mg once daily. He is having abdominal swelling as well.  He denies any change in his diet, but feels like all his issues started with his fall.  BP- 119/76 He complains of right hand numbness.  I have advised the patient that I feel better knowing his weight is coming back down on the lasix 20 mg once daily. His last echo was 02/2017- EF 45-50%. He is still complaining of quite a bit of swelling in the right leg. I advised this is probably fluid with inflammation from his injury.   I have offered him an appointment on Monday 11/2 at 9:30 am with Christell Faith, PA to follow up. The patient voices understanding and is agreeable.

## 2019-04-05 ENCOUNTER — Ambulatory Visit: Payer: Medicaid Other | Admitting: Physician Assistant

## 2019-04-05 NOTE — Progress Notes (Signed)
° °Cardiology Office Note   ° °Date:  04/06/2019  ° °ID:  Bryce [name], DOB 06/11/1965, MRN [id number] ° °PCP:  Iloabachie, Chioma E, NP  °Cardiologist:  Timothy Gollan, MD  °Electrophysiologist:  None  ° °Chief Complaint: ED follow-up ° °History of Present Illness:  ° °Bryce [name] is a 53 y.o. male with history of CAD s/p multiple PCIs as below, systolic dysfunction, HTN, HLD, and ongoing tobacco abuse who presents for ED follow-up.  ° °Patient was previously followed by [facility]. He was admitted in 11/2010 with a NSTEMI and underwent PCI/BMS to the LAD. He was readmitted in 02/2017 with a lateral ST elevation MI and underwent successful PCI/DES to the mid to distal LCx on 02/02/2017. Echo at that time showed an EF of 45-50%. 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 He established care with [facility] in 11/2018. At that time, he reported being seen in the ED in 08/2018 for swelling and fatigue. He was noted to have elevated BP leading his metoprolol to be increased. He also had a new LBBB. Initial troponin was normal and not trended. Given his prior symptoms and new LBBB, he underwent Lexiscan Myoview in 11/2018 that showed no significant ischemia, a fixed apical and periapical defect with concern for over-processing, normal wall motion with an EF of 48% (felt to be reduced secondary to GI uptake artifact), EKG with baseline LBBB. Overall, this was a low risk scan.  ° °He has been seen in the ED 4 times in 03/2019, with the first three visit being related to trauma from a fall in which he hit his right side leading to an avulsion fracture of the right ankle as well as olecranon bursitis at the 2nd visit and right upper extremity cellulitis on the third visit respectively. More recently, he was seen in the ED on 03/30/2019 with complaints of SOB and lower extremity edema that had been gradual in onset over the prior week and  was acutely worse on the right. Ultrasound was negative for lower extremity DVT bilaterally. HS-Tn of 5 with a delta of 4. BNP 10, CBC unremarkable, K+ 3.9, BUN 17, SCr 0.8. CXR without acute cardiopulmonary disease. EKG showed sinus rhythm, 93 bpm, possible prior anterior and inferior infarct, poor R wave progression along the precordial leads, nonspecific st/t changes. He was given a diuretic in the ED and advised to follow up as an outpatient. He called our office on 10/28 and reported a weight of 242 pounds ~ 3 weeks prior with a weight of 266 pounds on 10/27, as well as abdominal swelling. He reported good UOP, though persistent swelling. He felt like his symptoms began with the above mentioned fall earlier in 03/2019.  ° °He was seen by his PCP on 10/29 with a documented weight of 257 pounds and advised to continue Lasix 20 mg daily/elevate the legs. ° °Patient comes in today indicating he has not felt well since his ED visit dating back to 08/2018.  Around the time of his ED visit in March 2020 reported tachypalpitations that were persistent.  He was noted to have a tachycardic heart rate on EKG with new left bundle.  Further work-up was not obtained at that time.  He indicates the tachypalpitations persisted for several weeks afterwards with spontaneous resolution.  More recently, after sustaining a mechanical fall in early 03/2019.  In talking with the patient about this fall, he was out at his   shed with his granddaughter in which she stopped suddenly to turn around and show him something.  The patient did not want to fall onto his daughter so he leaned over towards his side causing him to fall off the side of a step and landed on his right side leading to the above trauma.  Since then, he has noted increase in bilateral lower extremity swelling with the right being greater than the left side.  He has noted increased dyspnea on exertion and increased fatigue.  He denies any chest pain or palpitations.  No  dizziness, presyncope, or syncope.  Over the past week he has noted increased orthopnea and is now sleeping with 3 pillows with a baseline of 1-1-1/2 pillows.  He denies any changes in diet and in fact has been trying to eat foods lower in sodium and healthier.  He does report some improvement in his breathing and lower extremity swelling following initiation of Lasix 20 mg daily.  However, he does continue to note significant abdominal distention. ° ° °Labs: °03/2019 - HGB 14.4, PLT 214, K+ 3.9, BUN 17, SCr 0.8, albumin 4.1, AST/ALT normal °01/2019 - A1c 5.8 °10/2018 - TC 189, TG 120, HDL 45, LDL 120 ° °Past Medical History:  °Diagnosis Date  °• Chronic systolic congestive heart failure (HCC)   °• Coronary artery disease   °• GERD (gastroesophageal reflux disease)   °• Hypertension   °• Ischemic cardiomyopathy   °• MI (myocardial infarction) (HCC)   °• Migraine   ° ° °Past Surgical History:  °Procedure Laterality Date  °• CARDIAC CATHETERIZATION N/A 12/28/2014  ° Procedure: Left Heart Cath and Coronary Angiography;  Surgeon: Dwayne D Callwood, MD;  Location: [facility];  Service: Cardiovascular;  Laterality: N/A;  °• CORONARY ANGIOPLASTY    ° Non-ST elevation MI status post stenting within bare-metal stent of the 75% lesion of the LAD, 11/13/2010.   °• CORONARY STENT INTERVENTION N/A 02/02/2017  ° Procedure: CORONARY/GRAFT ACUTE MI REVASCULARIZATION;  Surgeon: Callwood, Dwayne D, MD;  Location: [facility];  Service: Cardiovascular;  Laterality: N/A;  °• LEFT HEART CATH AND CORONARY ANGIOGRAPHY N/A 02/02/2017  ° Procedure: LEFT HEART CATH AND CORONARY ANGIOGRAPHY;  Surgeon: Callwood, Dwayne D, MD;  Location: [facility];  Service: Cardiovascular;  Laterality: N/A;  ° ° °Current Medications: °Current Meds  °Medication Sig  °• aspirin EC 81 MG EC tablet Take 1 tablet (81 mg total) by mouth daily.  °• aspirin-acetaminophen-caffeine (EXCEDRIN MIGRAINE) 250-250-65 MG tablet Take 1 tablet by mouth  daily as needed for headache.  °• Blood Pressure KIT 1 kit by Does not apply route daily.  °• clopidogrel (PLAVIX) 75 MG tablet TAKE ONE TABLET BY MOUTH EVERY DAY  °• Cyanocobalamin (VITAMIN B 12) 100 MCG LOZG Take 1,000 mcg by mouth daily.  °• cyclobenzaprine (FLEXERIL) 10 MG tablet Take 1 tablet (10 mg total) by mouth 3 (three) times daily as needed.  °• Docosanol (ABREVA) 10 % CREA Apply 1 application topically 2 (two) times daily.  °• ezetimibe (ZETIA) 10 MG tablet Take 1 tablet (10 mg total) by mouth daily.  °• furosemide (LASIX) 20 MG tablet Take 1 tablet (20 mg total) by mouth daily for 14 days.  °• gabapentin (NEURONTIN) 100 MG capsule Take 1 capsule (100 mg total) by mouth 3 (three) times daily.  °• lisinopril (ZESTRIL) 20 MG tablet Take 1 tablet (20 mg total) by mouth daily.  °• meloxicam (MOBIC) 15 MG tablet Take 1 tablet (  tablet (15 mg total) by mouth daily.   metoprolol tartrate (LOPRESSOR) 25 MG tablet TAKE ONE TABLET BY MOUTH 2 TIMES A DAY   nitroGLYCERIN (NITROSTAT) 0.4 MG SL tablet Place 1 tablet (0.4 mg total) under the tongue every 5 (five) minutes x 3 doses as needed for chest pain.   pantoprazole (PROTONIX) 40 MG tablet Take 1 tablet (40 mg total) by mouth daily.   pravastatin (PRAVACHOL) 40 MG tablet Take 1 tablet (40 mg total) by mouth daily.    Allergies:   Atorvastatin   Social History   Socioeconomic History   Marital status: Single    Spouse name: Not on file   Number of children: Not on file   Years of education: Not on file   Highest education level: Not on file  Occupational History   Occupation: Scientist, water quality and Regulatory affairs officer strain: Not on file   Food insecurity    Worry: Not on file    Inability: Not on file   Transportation needs    Medical: Not on file    Non-medical: Not on file  Tobacco Use   Smoking status: Current Some Day Smoker    Packs/day: 1.00    Years: 5.00    Pack years: 5.00    Types: Cigarettes    Last attempt  to quit: 01/05/2017    Years since quitting: 2.2   Smokeless tobacco: Never Used   Tobacco comment: states smokes occasionally 10-12-18  Substance and Sexual Activity   Alcohol use: Not Currently   Drug use: Yes    Types: Marijuana   Sexual activity: Yes  Lifestyle   Physical activity    Days per week: Not on file    Minutes per session: Not on file   Stress: Not on file  Relationships   Social connections    Talks on phone: Not on file    Gets together: Not on file    Attends religious service: Not on file    Active member of club or organization: Not on file    Attends meetings of clubs or organizations: Not on file    Relationship status: Not on file  Other Topics Concern   Not on file  Social History Narrative   Not on file     Family History:  The patient's family history includes CAD in his father. There is no history of Kidney disease or Diabetes Mellitus II.  ROS:   Review of Systems  Constitutional: Positive for malaise/fatigue. Negative for chills, diaphoresis, fever and weight loss.  HENT: Negative for congestion.   Eyes: Negative for discharge and redness.  Respiratory: Positive for shortness of breath. Negative for cough, hemoptysis, sputum production and wheezing.   Cardiovascular: Positive for leg swelling. Negative for chest pain, palpitations, orthopnea, claudication and PND.  Gastrointestinal: Negative for abdominal pain, blood in stool, heartburn, melena, nausea and vomiting.  Genitourinary: Negative for hematuria.  Musculoskeletal: Positive for falls. Negative for myalgias.  Skin: Negative for rash.  Neurological: Positive for weakness. Negative for dizziness, tingling, tremors, sensory change, speech change, focal weakness and loss of consciousness.  Endo/Heme/Allergies: Does not bruise/bleed easily.  Psychiatric/Behavioral: Negative for substance abuse. The patient is not nervous/anxious.   All other systems reviewed and are  negative.    EKGs/Labs/Other Studies Reviewed:    Studies reviewed were summarized above. The additional studies were reviewed today: As above.    EKG:  EKG is ordered today.  The EKG ordered today demonstrates  69 bpm, left axis deviation, diffuse T wave inversion involving the inferior, anterior, and lateral leads, which are new/more pronounced when compared to prior study ° °Recent Labs: °03/17/2019: ALT 28 °03/30/2019: B Natriuretic Peptide 10.0; BUN 17; Creatinine, Ser 0.80; Hemoglobin 14.4; Platelets 214; Potassium 3.9; Sodium 137  °Recent Lipid Panel °   °Component Value Date/Time  ° CHOL 189 10/21/2018 1144  ° TRIG 120 10/21/2018 1144  ° HDL 45 10/21/2018 1144  ° CHOLHDL 4.2 10/21/2018 1144  ° CHOLHDL 5.4 02/03/2017 0414  ° VLDL 36 02/03/2017 0414  ° LDLCALC 120 (H) 10/21/2018 1144  ° ° °PHYSICAL EXAM:   ° °VS:  BP 108/70 (BP Location: Left Arm, Patient Position: Sitting, Cuff Size: Large)    Pulse 69    Ht 5' 8" (1.727 m)    Wt 259 lb 8 oz (117.7 kg)    SpO2 98%    BMI 39.46 kg/m²   BMI: Body mass index is 39.46 kg/m². ° °Physical Exam  °Constitutional: He is oriented to person, place, and time. He appears well-developed and well-nourished.  °HENT:  °Head: Normocephalic and atraumatic.  °Eyes: Right eye exhibits no discharge. Left eye exhibits no discharge.  °Neck: Normal range of motion. No JVD present.  °JVD difficult to assess secondary to body habitus  °Cardiovascular: Normal rate, regular rhythm, S1 normal, S2 normal and normal heart sounds. Exam reveals no distant heart sounds, no friction rub, no midsystolic click and no opening snap.  °No murmur heard. °Pulmonary/Chest: Effort normal and breath sounds normal. No respiratory distress. He has no decreased breath sounds. He has no wheezes. He has no rales. He exhibits no tenderness.  °Abdominal: Soft. He exhibits distension. There is no abdominal tenderness.  °Musculoskeletal:     °   General: Edema present.  °   Comments: 1+ bilateral  lower extremity pitting edema to the knees.  Ankle brace noted along the right lower extremity  °Neurological: He is alert and oriented to person, place, and time.  °Skin: Skin is warm and dry. No cyanosis. Nails show no clubbing.  °Psychiatric: He has a normal mood and affect. His speech is normal and behavior is normal. Judgment and thought content normal.  °Vitals reviewed. ° ° °Wt Readings from Last 3 Encounters:  °04/06/19 259 lb 8 oz (117.7 kg)  °04/01/19 257 lb (116.6 kg)  °03/30/19 260 lb (117.9 kg)  °  ° °ASSESSMENT & PLAN:  ° °1. CAD involving the native coronary arteries without angina: Currently without symptoms of chest pain.  He does note significant dyspnea on exertion which may be his anginal equivalent.  Given recent Lexiscan Myoview demonstrating no significant ischemia we will begin with optimization of medical therapy and diuresis and reassess thereafter.  Check echo as below.  Pending echo results and symptom improvement we may need to revisit right and left cardiac catheterization.  Continue aspirin and Plavix. ° °2. Acute on chronic systolic dysfunction: Patient reports a dry weight of approximately 242 pounds (this is since is a sedentary lifestyle in the setting of the COVID-19 pandemic) with a current weight of 259 pounds.  He has bilateral lower extremity pitting edema, orthopnea, and abdominal distention.  Increase Lasix to 40 mg daily for 5 days with addition of KCl 20 mEq daily for 5 days followed by decreasing of Lasix back down to 20 mg daily thereafter with KCl 10 mEq daily.  Check echo to evaluate LV systolic function and right-sided pressure.  May need right and left cardiac   cath following outpatient diuresis and echo.  Continue metoprolol and lisinopril. ° °3. Palpitations: Patient reported a history of prolonged palpitations around the time of his ED visit in 08/2018 in which she was noted to be tachycardic and have a new left bundle.  Subsequent EKGs have demonstrated sinus  rhythm with no further left bundle.  Cannot exclude potential rate related bundle.  He has not had any further palpitations therefore we have deferred outpatient cardiac monitoring at this time though this may need to be revisited in the future. ° °4. HTN: Blood pressure well controlled as outlined above. ° °5. HLD: LDL of 120 from 10/2018 with goal LDL being less than 70.  He is intolerant to atorvastatin.  He does not feel like he has tried Crestor previously.  Currently remains on pravastatin and Zetia.  Check CMP, lipid panel, and direct LDL.  If LDL remains above 70 will recommend discontinuation of pravastatin and trial of Crestor.  If he is intolerant to Crestor we will need to refer him to the lipid clinic for consideration of PCSK9 inhibitor versus bempedoic acid.  ° °6. Tobacco abuse: Complete cessation is recommended. ° °7. Obesity: Cannot exclude some degree of sleep apnea or obesity hypoventilation syndrome playing a role as well.  In follow-up, recommend discussion of outpatient sleep study. ° °Disposition: F/u with Dr. Gollan or an APP in 2 weeks. ° ° °Medication Adjustments/Labs and Tests Ordered: °Current medicines are reviewed at length with the patient today.  Concerns regarding medicines are outlined above. Medication changes, Labs and Tests ordered today are summarized above and listed in the Patient Instructions accessible in Encounters.  ° °Signed, °Ryan Dunn, PA-C °04/06/2019 2:33 PM    ° °[facility] °[address] °[place], [place] [address] °[phone] °

## 2019-04-06 ENCOUNTER — Other Ambulatory Visit: Payer: Self-pay

## 2019-04-06 ENCOUNTER — Ambulatory Visit
Admission: RE | Admit: 2019-04-06 | Discharge: 2019-04-06 | Disposition: A | Discharge status: Home / Self Care | Payer: Disability Insurance | Source: Ambulatory Visit | Attending: Dentistry | Admitting: Dentistry

## 2019-04-06 ENCOUNTER — Encounter: Payer: Self-pay | Admitting: Physician Assistant

## 2019-04-06 ENCOUNTER — Ambulatory Visit (INDEPENDENT_AMBULATORY_CARE_PROVIDER_SITE_OTHER): Payer: Self-pay | Admitting: Physician Assistant

## 2019-04-06 ENCOUNTER — Other Ambulatory Visit: Payer: Self-pay | Admitting: Dentistry

## 2019-04-06 VITALS — BP 108/70 | HR 69 | Ht 68.0 in | Wt 259.5 lb

## 2019-04-06 DIAGNOSIS — I10 Essential (primary) hypertension: Secondary | ICD-10-CM

## 2019-04-06 DIAGNOSIS — F17200 Nicotine dependence, unspecified, uncomplicated: Secondary | ICD-10-CM

## 2019-04-06 DIAGNOSIS — R0600 Dyspnea, unspecified: Secondary | ICD-10-CM

## 2019-04-06 DIAGNOSIS — I25118 Atherosclerotic heart disease of native coronary artery with other forms of angina pectoris: Secondary | ICD-10-CM

## 2019-04-06 DIAGNOSIS — E785 Hyperlipidemia, unspecified: Secondary | ICD-10-CM

## 2019-04-06 DIAGNOSIS — M5136 Other intervertebral disc degeneration, lumbar region: Secondary | ICD-10-CM

## 2019-04-06 DIAGNOSIS — I2510 Atherosclerotic heart disease of native coronary artery without angina pectoris: Secondary | ICD-10-CM

## 2019-04-06 DIAGNOSIS — I447 Left bundle-branch block, unspecified: Secondary | ICD-10-CM

## 2019-04-06 DIAGNOSIS — I519 Heart disease, unspecified: Secondary | ICD-10-CM

## 2019-04-06 DIAGNOSIS — R002 Palpitations: Secondary | ICD-10-CM

## 2019-04-06 MED ORDER — POTASSIUM CHLORIDE CRYS ER 10 MEQ PO TBCR: 10.0000 meq | EXTENDED_RELEASE_TABLET | Freq: Every day | ORAL | 11 refills | Status: DC

## 2019-04-06 MED ORDER — CYCLOBENZAPRINE HCL 10 MG PO TABS: 10.0000 mg | ORAL_TABLET | Freq: Three times a day (TID) | ORAL | 0 refills | Status: DC | PRN

## 2019-04-06 MED ORDER — FUROSEMIDE 20 MG PO TABS: 20.0000 mg | ORAL_TABLET | Freq: Every day | ORAL | 11 refills | Status: DC

## 2019-04-06 MED ORDER — NITROGLYCERIN 0.4 MG SL SUBL: 0.4000 mg | SUBLINGUAL_TABLET | SUBLINGUAL | 1 refills | Status: AC | PRN

## 2019-04-06 NOTE — Telephone Encounter (Signed)
Pt seen in clinic today. Encounter completed at this time.

## 2019-04-06 NOTE — Patient Instructions (Signed)
Medication Instructions:  1- INCREASE Lasix to 2 tablets (40 mg total) for 5 days, then continue to 1 tablet (20 mg total) once daily. 2- START K Cl take 2 tablets (20 mEq total) once daily for 5 days, then take 1 tablet (10 mEq total) once daily.  *If you need a refill on your cardiac medications before your next appointment, please call your pharmacy*  Lab Work: 1- Your physician recommends that you have lab work today(CMET)  2- Your physician recommends that you return for lab work in: 1 week at the medical mall. (BMET) No appt is needed. Hours are M-F 7AM- 6 PM.   If you have labs (blood work) drawn today and your tests are completely normal, you will receive your results only by: Marland Kitchen MyChart Message (if you have MyChart) OR . A paper copy in the mail If you have any lab test that is abnormal or we need to change your treatment, we will call you to review the results.  Testing/Procedures: 1- Echo  Please return to [facility] on ______________ at _______________ AM/PM for an Echocardiogram. Your physician has requested that you have an echocardiogram. Echocardiography is a painless test that uses sound waves to create images of your heart. It provides your doctor with information about the size and shape of your heart and how well your heart's chambers and valves are working. This procedure takes approximately one hour. There are no restrictions for this procedure. Please note; depending on visual quality an IV may need to be placed.    Follow-Up: At [facility], you and your health needs are our priority.  As part of our continuing mission to provide you with exceptional heart care, we have created designated Provider Care Teams.  These Care Teams include your primary Cardiologist (physician) and Advanced Practice Providers (APPs -  Physician Assistants and Nurse Practitioners) who all work together to provide you with the care you need, when you need it.  Your next  appointment:   2 weeks  The format for your next appointment:   In Person  Provider:    You may see Ida Rogue, MD or Christell Faith, PA-C.

## 2019-04-07 ENCOUNTER — Telehealth: Payer: Self-pay | Admitting: Cardiovascular Disease

## 2019-04-07 ENCOUNTER — Other Ambulatory Visit: Payer: Self-pay | Admitting: Gerontology

## 2019-04-07 DIAGNOSIS — Z0000 Encounter for general adult medical examination without abnormal findings: Secondary | ICD-10-CM

## 2019-04-07 DIAGNOSIS — I2510 Atherosclerotic heart disease of native coronary artery without angina pectoris: Secondary | ICD-10-CM

## 2019-04-07 LAB — LIPID PANEL
Chol/HDL Ratio: 4.6 ratio (ref 0.0–5.0)
Cholesterol, Total: 184 mg/dL (ref 100–199)
HDL: 40 mg/dL (ref >39)
LDL Chol Calc (NIH): 77 mg/dL (ref 0–99)
Triglycerides: 420 mg/dL — ABNORMAL HIGH (ref 0–149)
VLDL Cholesterol Cal: 67 mg/dL — ABNORMAL HIGH (ref 5–40)

## 2019-04-07 LAB — COMPREHENSIVE METABOLIC PANEL
ALT: 28 IU/L (ref 0–44)
AST: 26 IU/L (ref 0–40)
Albumin/Globulin Ratio: 1.3 (ref 1.2–2.2)
Albumin: 3.8 g/dL (ref 3.8–4.9)
Alkaline Phosphatase: 71 IU/L (ref 39–117)
BUN/Creatinine Ratio: 12 (ref 9–20)
BUN: 14 mg/dL (ref 6–24)
Bilirubin Total: 0.3 mg/dL (ref 0.0–1.2)
CO2: 21 mmol/L (ref 20–29)
Calcium: 9.2 mg/dL (ref 8.7–10.2)
Chloride: 105 mmol/L (ref 96–106)
Creatinine, Ser: 1.13 mg/dL (ref 0.76–1.27)
GFR calc Af Amer: 85 mL/min/{1.73_m2} (ref >59)
GFR calc non Af Amer: 73 mL/min/{1.73_m2} (ref >59)
Globulin, Total: 2.9 g/dL (ref 1.5–4.5)
Glucose: 109 mg/dL — ABNORMAL HIGH (ref 65–99)
Potassium: 4.6 mmol/L (ref 3.5–5.2)
Sodium: 141 mmol/L (ref 134–144)
Total Protein: 6.7 g/dL (ref 6.0–8.5)

## 2019-04-07 LAB — LDL CHOLESTEROL, DIRECT: LDL Direct: 107 mg/dL — ABNORMAL HIGH (ref 0–99)

## 2019-04-07 MED ORDER — ROSUVASTATIN CALCIUM 40 MG PO TABS: 40.0000 mg | ORAL_TABLET | Freq: Every day | ORAL | 3 refills | Status: DC

## 2019-04-07 NOTE — Telephone Encounter (Signed)
-----   Message from Rise Mu, PA-C sent at 04/07/2019  2:57 PM EST ----- LDL above goal of < 70.  Patient is intolerant to Lipitor.  If he is agreeable, please send in Crestor 40 mg daily and have him stop pravastatin.  He should keep taking Zetia.  Follow up fasting lipid and liver function in 8 weeks.

## 2019-04-07 NOTE — Telephone Encounter (Signed)
Returned call to [facility], they are trying to track down MD who wrote order.   I made them aware that Dr. Truddie Crumble does not work out of our [facility].   I will forward information to Christell Faith, PA as he saw pt last in office.   Lumbar xray from [facility] radiology, "Aortoiliac atherosclerotic vascular disease."

## 2019-04-07 NOTE — Telephone Encounter (Signed)
Returned call and spoke with clerk. I told her that I had forwarded info to Christell Faith, PA but he will only look over cardiac portion. I advised it would be best to contact his PCP listed, Iloabachie, Chioma E, NP". I googled her number and shared it with clerk.

## 2019-04-07 NOTE — Telephone Encounter (Signed)
[facility]([place]) is calling, states that they are aware this pt sees Christell Faith, [place] and would like to give report results for Lumbar spine xray. I informed her that Dr. Truddie Crumble is not in our practice. She states Dr Shaune Leeks office is disability determination office and does not take result reports. [facility] is concerned of the importance of this report being addressed with the patient. Please call to discuss.

## 2019-04-07 NOTE — Progress Notes (Signed)
Patient ID: Bryce [name], male    DOB: 1965-11-25, 53 y.o.   MRN: [id number]  HPI  Bryce [name] is a 53 y/o male with a history of CAD (MI), HTN, GERD, tobacco use and chronic heart failure.   Echo report from 02/03/17 reviewed and showed an EF of 45-50% along with mild inferior hypokinesis.   Catheterization done 02/02/17 which showed:  Mid LAD lesion, 0 %stenosed.  Mid RCA lesion, 40 %stenosed.  A STENT XIENCE ALPINE RX 3.0X23 drug eluting stent was successfully placed, and does not overlap previously placed stent.  Dist Cx lesion, 100 %stenosed.  Post intervention, there is a 0% residual stenosis.  There is mild left ventricular systolic dysfunction.  LV end diastolic pressure is mildly elevated.  The left ventricular ejection fraction is 50-55% by visual estimate. Conclusion Successful PCI and stent to mid to distal circumflex with DES stent 3.023 mm xience Reducing the lesion from 100% down to 0 point TIMI-3 from 0 back to 3.  Was in the ED 03/30/2019 due to leg edema. CXR negative for pulmonary edema. Ultrasound negative for DVT. Released with oral diuretic. Was in the ED 03/17/2019 due to cellulitis of right arm where he was treated and released. Was in the ED 03/11/2019 due to elbow pain where he was treated and released. Was in the ED 03/07/2019 due to ankle pain after a mechanical fall. Diagnosed with nondisplaced avulsion fracture of the right talus and released with podiatry follow-up.   He presents today for his initial visit with a chief complaint of moderate fatigue upon minimal exertion. He describes this as chronic in nature having been present for several months. He has associated shortness of breath, light-headedness, intermittent chest pain, pedal edema and depression along with this. He denies any difficulty sleeping, abdominal distention, palpitations, cough or weight gain.   Past Medical History:  Diagnosis Date  . Chronic systolic congestive heart failure  ([place])   . Coronary artery disease   . GERD (gastroesophageal reflux disease)   . Hypertension   . Ischemic cardiomyopathy   . MI (myocardial infarction) ([place])   . Migraine    Past Surgical History:  Procedure Laterality Date  . CARDIAC CATHETERIZATION N/A 12/28/2014   Procedure: Left Heart Cath and Coronary Angiography;  Surgeon: Yolonda Kida, MD;  Location: [facility];  Service: Cardiovascular;  Laterality: N/A;  . CORONARY ANGIOPLASTY     Non-ST elevation MI status post stenting within bare-metal stent of the 75% lesion of the LAD, 11/13/2010.   . CORONARY STENT INTERVENTION N/A 02/02/2017   Procedure: CORONARY/GRAFT ACUTE MI REVASCULARIZATION;  Surgeon: Yolonda Kida, MD;  Location: [facility];  Service: Cardiovascular;  Laterality: N/A;  . LEFT HEART CATH AND CORONARY ANGIOGRAPHY N/A 02/02/2017   Procedure: LEFT HEART CATH AND CORONARY ANGIOGRAPHY;  Surgeon: Yolonda Kida, MD;  Location: [facility];  Service: Cardiovascular;  Laterality: N/A;   Family History  Problem Relation Age of Onset  . CAD Father   . Kidney disease Neg Hx   . Diabetes Mellitus II Neg Hx    Social History   Tobacco Use  . Smoking status: Current Some Day Smoker    Packs/day: 1.00    Years: 5.00    Pack years: 5.00    Types: Cigarettes    Last attempt to quit: 01/05/2017    Years since quitting: 2.2  . Smokeless tobacco: Never Used  . Tobacco comment: states smokes occasionally 10-12-18  Substance Use Topics  . Alcohol use: Not Currently   Allergies  Allergen Reactions  . Atorvastatin     Myalgias   Prior to Admission medications   Medication Sig Start Date End Date Taking? Authorizing Provider  aspirin EC 81 MG EC tablet Take 1 tablet (81 mg total) by mouth daily. 02/05/17  Yes Epifanio Lesches, MD  aspirin-acetaminophen-caffeine (EXCEDRIN MIGRAINE) [phone] MG tablet Take 1 tablet by mouth daily as needed for headache. 01/06/19  Yes Iloabachie, Chioma E,  NP  Blood Pressure KIT 1 kit by Does not apply route daily. 04/01/19  Yes Iloabachie, Chioma E, NP  clopidogrel (PLAVIX) 75 MG tablet TAKE ONE TABLET BY MOUTH EVERY DAY 09/17/18  Yes Iloabachie, Chioma E, NP  Cyanocobalamin (VITAMIN B 12) 100 MCG LOZG Take 1,000 mcg by mouth daily. 04/01/19  Yes Iloabachie, Chioma E, NP  cyclobenzaprine (FLEXERIL) 10 MG tablet Take 1 tablet (10 mg total) by mouth 3 (three) times daily as needed. 04/06/19  Yes Dunn, Ryan M, PA-C  Docosanol (ABREVA) 10 % CREA Apply 1 application topically 2 (two) times daily. 01/15/18  Yes Dustin Flock, MD  ezetimibe (ZETIA) 10 MG tablet Take 1 tablet (10 mg total) by mouth daily. 11/03/18 04/08/19 Yes Gollan, Kathlene November, MD  furosemide (LASIX) 20 MG tablet Take 1 tablet (20 mg total) by mouth daily. 04/06/19 05/06/19 Yes Dunn, Areta Haber, PA-C  gabapentin (NEURONTIN) 100 MG capsule Take 1 capsule (100 mg total) by mouth 3 (three) times daily. 03/30/19  Yes Iloabachie, Chioma E, NP  lisinopril (ZESTRIL) 20 MG tablet Take 1 tablet (20 mg total) by mouth daily. 04/01/19  Yes Iloabachie, Chioma E, NP  meloxicam (MOBIC) 15 MG tablet Take 1 tablet (15 mg total) by mouth daily. 03/07/19  Yes Cuthriell, Charline Bills, PA-C  metoprolol tartrate (LOPRESSOR) 25 MG tablet TAKE ONE TABLET BY MOUTH 2 TIMES A DAY 04/01/19  Yes Iloabachie, Chioma E, NP  nitroGLYCERIN (NITROSTAT) 0.4 MG SL tablet Place 1 tablet (0.4 mg total) under the tongue every 5 (five) minutes x 3 doses as needed for chest pain. 04/06/19  Yes Dunn, Areta Haber, PA-C  pantoprazole (PROTONIX) 40 MG tablet Take 1 tablet (40 mg total) by mouth daily. 01/28/19  Yes Iloabachie, Chioma E, NP  potassium chloride (KLOR-CON) 10 MEQ tablet Take 1 tablet (10 mEq total) by mouth daily. 04/06/19  Yes Dunn, Areta Haber, PA-C  rosuvastatin (CRESTOR) 40 MG tablet Take 1 tablet (40 mg total) by mouth daily. 04/07/19 07/06/19 Yes Dunn, Areta Haber, PA-C    Review of Systems  Constitutional: Positive for fatigue (tire easily).  Negative for appetite change.  HENT: Negative for congestion, postnasal drip and sore throat.   Eyes: Negative.   Respiratory: Positive for shortness of breath. Negative for cough.   Cardiovascular: Positive for chest pain (at times) and leg swelling. Negative for palpitations.  Gastrointestinal: Negative for abdominal distention and abdominal pain.  Endocrine: Negative.   Genitourinary: Negative.   Musculoskeletal: Negative for back pain and neck pain.  Skin: Negative.   Allergic/Immunologic: Negative.   Neurological: Positive for light-headedness (when getting up too quickly). Negative for dizziness.  Hematological: Negative for adenopathy. Does not bruise/bleed easily.  Psychiatric/Behavioral: Positive for dysphoric mood. Negative for sleep disturbance (sleeping on 2 pillows).    Vitals:   04/08/19 1324  BP: 134/87  Pulse: 82  Resp: 18  SpO2: 98%  Weight: 257 lb 6.4 oz (116.8 kg)  Height: _0  (1.727 m)   Wt Readings from Last  3 Encounters:  04/08/19 257 lb 6.4 oz (116.8 kg)  04/06/19 259 lb 8 oz (117.7 kg)  04/01/19 257 lb (116.6 kg)   Lab Results  Component Value Date   CREATININE 1.13 04/06/2019   CREATININE 0.80 03/30/2019   CREATININE 0.85 03/17/2019     Physical Exam Vitals signs and nursing note reviewed.  Constitutional:      Appearance: Normal appearance.  HENT:     Head: Normocephalic and atraumatic.  Neck:     Musculoskeletal: Normal range of motion and neck supple.  Cardiovascular:     Rate and Rhythm: Normal rate and regular rhythm.  Pulmonary:     Effort: Pulmonary effort is normal. No respiratory distress.     Breath sounds: No wheezing or rales.  Abdominal:     General: There is no distension.     Palpations: Abdomen is soft.  Musculoskeletal:        General: No tenderness.     Right lower leg: Edema (trace pitting) present.     Left lower leg: Edema (trace pitting) present.  Skin:    General: Skin is warm and dry.  Neurological:      General: No focal deficit present.     Mental Status: He is alert and oriented to person, place, and time.  Psychiatric:        Mood and Affect: Mood normal.        Behavior: Behavior normal.        Thought Content: Thought content normal.      Assessment & Plan:  1: Chronic heart failure with mildly reduced ejection fraction- - NYHA class III - euvolemic today - weighing daily and he was reminded to call for an overnight weight gain of >2 pounds or a weekly weight gain of >5 pounds - adds salt "to everything" as he says that he gets "bad cramps"; encouraged him to not add salt to his food and to read food labels for sodium content - saw cardiology (Dunn) 04/06/2019 with increase in diuretic for 5 days - echo scheduled for 04/26/2019 - BNP 03/30/2019 was 10.0 - elevating his legs when sitting for long periods of time; encouraged him to get compression socks and put them on in the morning with removal at bedtime  2: HTN- - BP looks good today - saw PCP (Iloabachie) 04/01/2019 - BMP 04/06/2019 reviewed and showed sodium 141, potassium 4.6, creatinine 1.13 and GFR 73  3: Tobacco use- - smokes "some" - complete cessation discussed - patient says that he had an xray done and "something showed up" but he wasn't told what and is waiting to get a CT done for further evaluation  Patient did not bring his medications nor a list. Each medication was verbally reviewed with the patient and he was encouraged to bring the bottles to every visit to confirm accuracy of list.  Return in 6 weeks or sooner for any questions/problems before then.

## 2019-04-07 NOTE — Telephone Encounter (Signed)
° °  Valarie Merino from [facility] radiology was following up on his call that he placed to Dr. Rockey Situ and Christell Faith yesterday. There were some results from an imaging procedure that they wanted to make sure Dr. Rockey Situ and Christell Faith were aware of.  Anyone who answers the phone when our office calls will be able to help if Valarie Merino is not available.

## 2019-04-07 NOTE — Telephone Encounter (Signed)
Call to patient to discuss POC based on lab results.   Pt verbalized understanding and agreeable to POC.   Med change sent to pt preferred pharmacy.   [facility]. Nothing to eat or drink after midnight except for black coffee and water. No appt needed.   LDL above goal of < 70.   Start Crestor 40 mg daily and  stop pravastatin.  He should keep taking Zetia.    Sent information to patient via mychart.

## 2019-04-07 NOTE — Telephone Encounter (Signed)
All findings on this study are deferred to the ordering provider. [facility] needs to contact that provider. I will route the note to his PCP as well given needed follow up on findings at their vs ordering provider's discretion. I will also route to Shanda Howells to get risk management involved as the call report should have gone to the ordering provider. I did not order this study.   With regards to PAD noted, his direct LDL was noted to be 107. Please see result note attached to that lab for medication recommendations.

## 2019-04-07 NOTE — Telephone Encounter (Signed)
Patient returning call for results 

## 2019-04-08 ENCOUNTER — Telehealth: Payer: Self-pay | Admitting: Gerontology

## 2019-04-08 ENCOUNTER — Other Ambulatory Visit: Payer: Self-pay | Admitting: Gerontology

## 2019-04-08 ENCOUNTER — Ambulatory Visit: Payer: Self-pay | Admitting: Family

## 2019-04-08 ENCOUNTER — Other Ambulatory Visit: Payer: Self-pay

## 2019-04-08 ENCOUNTER — Ambulatory Visit
Admission: RE | Admit: 2019-04-08 | Discharge: 2019-04-08 | Disposition: A | Discharge status: Home / Self Care | Payer: Self-pay | Source: Ambulatory Visit | Attending: Gerontology | Admitting: Gerontology

## 2019-04-08 ENCOUNTER — Encounter: Payer: Self-pay | Admitting: Family

## 2019-04-08 VITALS — BP 134/87 | HR 82 | Resp 18 | Ht 68.0 in | Wt 257.4 lb

## 2019-04-08 DIAGNOSIS — I10 Essential (primary) hypertension: Secondary | ICD-10-CM

## 2019-04-08 DIAGNOSIS — I519 Heart disease, unspecified: Secondary | ICD-10-CM | POA: Insufficient documentation

## 2019-04-08 DIAGNOSIS — F17200 Nicotine dependence, unspecified, uncomplicated: Secondary | ICD-10-CM

## 2019-04-08 DIAGNOSIS — Z0000 Encounter for general adult medical examination without abnormal findings: Secondary | ICD-10-CM | POA: Insufficient documentation

## 2019-04-08 DIAGNOSIS — R9389 Abnormal findings on diagnostic imaging of other specified body structures: Secondary | ICD-10-CM

## 2019-04-08 NOTE — Patient Instructions (Signed)
Continue weighing daily and call for an overnight weight gain of > 2 pounds or a weekly weight gain of >5 pounds. 

## 2019-04-08 NOTE — Telephone Encounter (Signed)
Mr Doris was notified to go and have Chest X ray done.

## 2019-04-14 ENCOUNTER — Other Ambulatory Visit: Payer: Self-pay | Admitting: *Deleted

## 2019-04-14 ENCOUNTER — Other Ambulatory Visit
Admission: RE | Admit: 2019-04-14 | Discharge: 2019-04-14 | Disposition: A | Discharge status: Home / Self Care | Payer: Medicaid Other | Attending: Cardiovascular Disease | Admitting: Cardiovascular Disease

## 2019-04-14 DIAGNOSIS — I10 Essential (primary) hypertension: Secondary | ICD-10-CM

## 2019-04-14 DIAGNOSIS — I2510 Atherosclerotic heart disease of native coronary artery without angina pectoris: Secondary | ICD-10-CM

## 2019-04-14 DIAGNOSIS — Z79899 Other long term (current) drug therapy: Secondary | ICD-10-CM

## 2019-04-14 LAB — LIPID PANEL
Cholesterol: 188 mg/dL (ref 0–200)
HDL: 49 mg/dL (ref >40)
LDL Cholesterol: 87 mg/dL (ref 0–99)
Total CHOL/HDL Ratio: 3.8 RATIO
Triglycerides: 261 mg/dL — ABNORMAL HIGH (ref <150)
VLDL: 52 mg/dL — ABNORMAL HIGH (ref 0–40)

## 2019-04-14 LAB — HEPATIC FUNCTION PANEL
ALT: 29 U/L (ref 0–44)
AST: 18 U/L (ref 15–41)
Albumin: 4.1 g/dL (ref 3.5–5.0)
Alkaline Phosphatase: 54 U/L (ref 38–126)
Bilirubin, Direct: <0.1 mg/dL (ref 0.0–0.2)
Total Bilirubin: 0.8 mg/dL (ref 0.3–1.2)
Total Protein: 7.2 g/dL (ref 6.5–8.1)

## 2019-04-14 LAB — BASIC METABOLIC PANEL
Anion gap: 9 (ref 5–15)
BUN: 22 mg/dL — ABNORMAL HIGH (ref 6–20)
CO2: 26 mmol/L (ref 22–32)
Calcium: 9 mg/dL (ref 8.9–10.3)
Chloride: 103 mmol/L (ref 98–111)
Creatinine, Ser: 0.89 mg/dL (ref 0.61–1.24)
GFR calc Af Amer: >60 mL/min (ref >60)
GFR calc non Af Amer: >60 mL/min (ref >60)
Glucose, Bld: 102 mg/dL — ABNORMAL HIGH (ref 70–99)
Potassium: 4 mmol/L (ref 3.5–5.1)
Sodium: 138 mmol/L (ref 135–145)

## 2019-04-15 ENCOUNTER — Telehealth: Payer: Self-pay | Admitting: *Deleted

## 2019-04-15 DIAGNOSIS — I2510 Atherosclerotic heart disease of native coronary artery without angina pectoris: Secondary | ICD-10-CM

## 2019-04-15 NOTE — Telephone Encounter (Signed)
Agree with RN note. Patient must follow up with ordering provider regarding abnormal imaging.

## 2019-04-15 NOTE — Telephone Encounter (Signed)
Reviewed results and recommendations with patient. He reports that he has not started the Crestor yet due to pending from medication management. He was agreeable to doing labs in late January. Let him know that I would have scheduling call him to get those arranged to be done here in our office. He then went on to say that he was told he had a mass which showed up on some imaging and now he is worrying to death over this. Advised that I am not able to review that information with him and encouraged him to reach out to their office for any questions he may continue to have about this. He verbalized understanding of our conversation with results and recommendations.

## 2019-04-15 NOTE — Telephone Encounter (Signed)
-----   Message from Rise Mu, PA-C sent at 04/14/2019 11:33 AM EST ----- It is too early to have these rechecked. He just started Crestor in early 04/2019. Recheck was not due for 8 weeks. Nonetheless, his triglycerides are elevated. Please have him cut down on sugary foods and drinks. LDL is improving on Crestor. Liver function is normal. Recheck fasting lipid and liver function in mid 06/2019.

## 2019-04-21 NOTE — Telephone Encounter (Signed)
Pam - no scheduled for lab until January .  I can call him then or [facility]   Please advise

## 2019-04-21 NOTE — Telephone Encounter (Signed)
Call in January for labs .

## 2019-04-26 ENCOUNTER — Ambulatory Visit (INDEPENDENT_AMBULATORY_CARE_PROVIDER_SITE_OTHER): Payer: Self-pay

## 2019-04-26 ENCOUNTER — Other Ambulatory Visit: Payer: Self-pay

## 2019-04-26 DIAGNOSIS — R0600 Dyspnea, unspecified: Secondary | ICD-10-CM

## 2019-04-26 MED ORDER — PERFLUTREN LIPID MICROSPHERE
1.0000 mL | INTRAVENOUS | Status: AC | PRN
  Administered 2019-04-26: 2 mL via INTRAVENOUS

## 2019-04-27 ENCOUNTER — Ambulatory Visit
Admission: RE | Admit: 2019-04-27 | Discharge: 2019-04-27 | Disposition: A | Discharge status: Home / Self Care | Payer: Self-pay | Source: Ambulatory Visit | Attending: Gerontology | Admitting: Gerontology

## 2019-04-27 ENCOUNTER — Other Ambulatory Visit: Payer: Self-pay

## 2019-04-27 DIAGNOSIS — Z8719 Personal history of other diseases of the digestive system: Secondary | ICD-10-CM

## 2019-04-27 DIAGNOSIS — R9389 Abnormal findings on diagnostic imaging of other specified body structures: Secondary | ICD-10-CM | POA: Insufficient documentation

## 2019-04-27 MED ORDER — PANTOPRAZOLE SODIUM 40 MG PO TBEC: 40.0000 mg | DELAYED_RELEASE_TABLET | Freq: Every day | ORAL | 0 refills | Status: DC

## 2019-04-27 MED ORDER — IOHEXOL 300 MG/ML  SOLN
100.0000 mL | Freq: Once | INTRAMUSCULAR | Status: AC | PRN
  Administered 2019-04-27: 100 mL via INTRAVENOUS

## 2019-05-03 ENCOUNTER — Telehealth: Payer: Self-pay

## 2019-05-03 ENCOUNTER — Other Ambulatory Visit: Payer: Self-pay | Admitting: Physician Assistant

## 2019-05-03 ENCOUNTER — Other Ambulatory Visit: Payer: Self-pay | Admitting: Gerontology

## 2019-05-03 DIAGNOSIS — R200 Anesthesia of skin: Secondary | ICD-10-CM

## 2019-05-03 NOTE — Telephone Encounter (Signed)
Call to patient to review results of Echo.  No further orders at this time.   Pt reports that he has been feeling well but is concerned that he has not received results from recent CT to r/o mass noted on x ray. After reviewed file, I forwarded onto Christell Faith, PA for further review in regards to CAD dx.   Pt has no further questions at this time and I let him know that we would call when we had further instruction from Christell Faith, [place].

## 2019-05-03 NOTE — Telephone Encounter (Signed)
-----   Message from Ryan M Dunn, PA-C sent at 04/28/2019  8:44 AM EST ----- Echo showed a slightly reduced pump function with an EF of 45-50% (normal 55-60%), mild thickening of the heart muscle, no significant valvular abnormalities, small atrial septal defect with left to right shunting noted.   Pump function is stable when compared to prior study. In follow up with Dr. Gollan, consider addition of spironolactone with discontinuation of KCl. This is deferred at this time in the setting of worsening COVID-19 pandemic in an effort to minimize patient exposure. 

## 2019-05-03 NOTE — Telephone Encounter (Signed)
-----   Message from Rise Mu, PA-C sent at 04/28/2019  8:44 AM EST ----- Echo showed a slightly reduced pump function with an EF of 45-50% (normal 55-60%), mild thickening of the heart muscle, no significant valvular abnormalities, small atrial septal defect with left to right shunting noted.   Pump function is stable when compared to prior study. In follow up with Dr. Rockey Situ, consider addition of spironolactone with discontinuation of KCl. This is deferred at this time in the setting of worsening COVID-19 pandemic in an effort to minimize patient exposure.

## 2019-05-03 NOTE — Telephone Encounter (Signed)
Attempted to call patient. No answer, no vm. 05/03/2019

## 2019-05-04 ENCOUNTER — Telehealth: Payer: Self-pay

## 2019-05-04 ENCOUNTER — Other Ambulatory Visit: Payer: Self-pay | Admitting: Gerontology

## 2019-05-04 DIAGNOSIS — R9389 Abnormal findings on diagnostic imaging of other specified body structures: Secondary | ICD-10-CM

## 2019-05-04 DIAGNOSIS — I2510 Atherosclerotic heart disease of native coronary artery without angina pectoris: Secondary | ICD-10-CM

## 2019-05-04 NOTE — Telephone Encounter (Signed)
Call to patient to review note from Christell Faith, [place].   Pt verbalized understanding and will get labs prior to appt in Jan.   Advised pt to call for any further questions or concerns.

## 2019-05-04 NOTE — Telephone Encounter (Signed)
-----   Message from Ryan M Dunn, PA-C sent at 05/03/2019 12:24 PM EST ----- He has known CAD with multiple PCIs. See his note from 11/3. Chest CT is not gated to evaluate degree of stenosis. Myoview low risk. Continue medical therapy. He is due for follow up fasting lipid panel in 06/2019.  ----- Message ----- From: Bryce [name].Bryce Kretsch, RN Sent: 05/03/2019  10:48 AM EST To: Ryan M Dunn, PA-C  Ryan,  I called patient to make him aware of Echo results. He reported that he recently had CT, after ,looking over it I see some issues with CAD. Not sure if this is something we should f/u on. He recently had low risk myoview and is not currently having sx.  [name]   

## 2019-05-04 NOTE — Telephone Encounter (Signed)
-----   Message from Rise Mu, [place] sent at 05/03/2019 12:24 PM EST ----- He has known CAD with multiple PCIs. See his note from 11/3. Chest CT is not gated to evaluate degree of stenosis. Myoview low risk. Continue medical therapy. He is due for follow up fasting lipid panel in 06/2019.  ----- Message ----- From: Alroy Dust.Daleen Bo, RN Sent: 05/03/2019  10:48 AM EST To: Rise Mu, PA-C  Ryan,  I called patient to make him aware of Echo results. He reported that he recently had CT, after ,looking over it I see some issues with CAD. Not sure if this is something we should f/u on. He recently had low risk myoview and is not currently having sx.  Kynlie Jane

## 2019-05-07 ENCOUNTER — Other Ambulatory Visit: Payer: Self-pay | Admitting: Gerontology

## 2019-05-07 DIAGNOSIS — Z8719 Personal history of other diseases of the digestive system: Secondary | ICD-10-CM

## 2019-05-10 ENCOUNTER — Other Ambulatory Visit: Payer: Self-pay

## 2019-05-10 DIAGNOSIS — Z8719 Personal history of other diseases of the digestive system: Secondary | ICD-10-CM

## 2019-05-10 MED ORDER — PANTOPRAZOLE SODIUM 40 MG PO TBEC: 40.0000 mg | DELAYED_RELEASE_TABLET | Freq: Every day | ORAL | 2 refills | Status: DC

## 2019-05-13 ENCOUNTER — Ambulatory Visit: Payer: Medicaid Other | Admitting: Gerontology

## 2019-05-13 ENCOUNTER — Other Ambulatory Visit: Payer: Self-pay

## 2019-05-13 ENCOUNTER — Encounter: Payer: Self-pay | Admitting: Gerontology

## 2019-05-13 VITALS — BP 137/87 | HR 72 | Ht 68.0 in | Wt 270.0 lb

## 2019-05-13 DIAGNOSIS — I509 Heart failure, unspecified: Secondary | ICD-10-CM

## 2019-05-13 DIAGNOSIS — R600 Localized edema: Secondary | ICD-10-CM

## 2019-05-13 MED ORDER — METOLAZONE 2.5 MG PO TABS: 2.5000 mg | ORAL_TABLET | Freq: Every day | ORAL | 0 refills | Status: DC

## 2019-05-13 MED ORDER — POTASSIUM CHLORIDE CRYS ER 10 MEQ PO TBCR: 10.0000 meq | EXTENDED_RELEASE_TABLET | Freq: Every day | ORAL | 11 refills | Status: DC

## 2019-05-13 NOTE — Patient Instructions (Signed)
Calorie Counting for Weight Loss Calories are units of energy. Your body needs a certain amount of calories from food to keep you going throughout the day. When you eat more calories than your body needs, your body stores the extra calories as fat. When you eat fewer calories than your body needs, your body burns fat to get the energy it needs. Calorie counting means keeping track of how many calories you eat and drink each day. Calorie counting can be helpful if you need to lose weight. If you make sure to eat fewer calories than your body needs, you should lose weight. Ask your health care provider what a healthy weight is for you. For calorie counting to work, you will need to eat the right number of calories in a day in order to lose a healthy amount of weight per week. A dietitian can help you determine how many calories you need in a day and will give you suggestions on how to reach your calorie goal.  A healthy amount of weight to lose per week is usually 1-2 lb (0.5-0.9 kg). This usually means that your daily calorie intake should be reduced by 500-750 calories.  Eating 1,200 - 1,500 calories per day can help most women lose weight.  Eating 1,500 - 1,800 calories per day can help most men lose weight. What is my plan? My goal is to have __________ calories per day. If I have this many calories per day, I should lose around __________ pounds per week. What do I need to know about calorie counting? In order to meet your daily calorie goal, you will need to:  Find out how many calories are in each food you would like to eat. Try to do this before you eat.  Decide how much of the food you plan to eat.  Write down what you ate and how many calories it had. Doing this is called keeping a food log. To successfully lose weight, it is important to balance calorie counting with a healthy lifestyle that includes regular activity. Aim for 150 minutes of moderate exercise (such as walking) or 75  minutes of vigorous exercise (such as running) each week. Where do I find calorie information?  The number of calories in a food can be found on a Nutrition Facts label. If a food does not have a Nutrition Facts label, try to look up the calories online or ask your dietitian for help. Remember that calories are listed per serving. If you choose to have more than one serving of a food, you will have to multiply the calories per serving by the amount of servings you plan to eat. For example, the label on a package of bread might say that a serving size is 1 slice and that there are 90 calories in a serving. If you eat 1 slice, you will have eaten 90 calories. If you eat 2 slices, you will have eaten 180 calories. How do I keep a food log? Immediately after each meal, record the following information in your food log:  What you ate. Don't forget to include toppings, sauces, and other extras on the food.  How much you ate. This can be measured in cups, ounces, or number of items.  How many calories each food and drink had.  The total number of calories in the meal. Keep your food log near you, such as in a small notebook in your pocket, or use a mobile app or website. Some programs will calculate   calories for you and show you how many calories you have left for the day to meet your goal. What are some calorie counting tips?   Use your calories on foods and drinks that will fill you up and not leave you hungry: ? Some examples of foods that fill you up are nuts and nut butters, vegetables, lean proteins, and high-fiber foods like whole grains. High-fiber foods are foods with more than 5 g fiber per serving. ? Drinks such as sodas, specialty coffee drinks, alcohol, and juices have a lot of calories, yet do not fill you up.  Eat nutritious foods and avoid empty calories. Empty calories are calories you get from foods or beverages that do not have many vitamins or protein, such as candy, sweets, and  soda. It is better to have a nutritious high-calorie food (such as an avocado) than a food with few nutrients (such as a bag of chips).  Know how many calories are in the foods you eat most often. This will help you calculate calorie counts faster.  Pay attention to calories in drinks. Low-calorie drinks include water and unsweetened drinks.  Pay attention to nutrition labels for "low fat" or "fat free" foods. These foods sometimes have the same amount of calories or more calories than the full fat versions. They also often have added sugar, starch, or salt, to make up for flavor that was removed with the fat.  Find a way of tracking calories that works for you. Get creative. Try different apps or programs if writing down calories does not work for you. What are some portion control tips?  Know how many calories are in a serving. This will help you know how many servings of a certain food you can have.  Use a measuring cup to measure serving sizes. You could also try weighing out portions on a kitchen scale. With time, you will be able to estimate serving sizes for some foods.  Take some time to put servings of different foods on your favorite plates, bowls, and cups so you know what a serving looks like.  Try not to eat straight from a bag or box. Doing this can lead to overeating. Put the amount you would like to eat in a cup or on a plate to make sure you are eating the right portion.  Use smaller plates, glasses, and bowls to prevent overeating.  Try not to multitask (for example, watch TV or use your computer) while eating. If it is time to eat, sit down at a table and enjoy your food. This will help you to know when you are full. It will also help you to be aware of what you are eating and how much you are eating. What are tips for following this plan? Reading food labels  Check the calorie count compared to the serving size. The serving size may be smaller than what you are used to  eating.  Check the source of the calories. Make sure the food you are eating is high in vitamins and protein and low in saturated and trans fats. Shopping  Read nutrition labels while you shop. This will help you make healthy decisions before you decide to purchase your food.  Make a grocery list and stick to it. Cooking  Try to cook your favorite foods in a healthier way. For example, try baking instead of frying.  Use low-fat dairy products. Meal planning  Use more fruits and vegetables. Half of your plate should be fruits   and vegetables.  Include lean proteins like poultry and fish. How do I count calories when eating out?  Ask for smaller portion sizes.  Consider sharing an entree and sides instead of getting your own entree.  If you get your own entree, eat only half. Ask for a box at the beginning of your meal and put the rest of your entree in it so you are not tempted to eat it.  If calories are listed on the menu, choose the lower calorie options.  Choose dishes that include vegetables, fruits, whole grains, low-fat dairy products, and lean protein.  Choose items that are boiled, broiled, grilled, or steamed. Stay away from items that are buttered, battered, fried, or served with cream sauce. Items labeled "crispy" are usually fried, unless stated otherwise.  Choose water, low-fat milk, unsweetened iced tea, or other drinks without added sugar. If you want an alcoholic beverage, choose a lower calorie option such as a glass of wine or light beer.  Ask for dressings, sauces, and syrups on the side. These are usually high in calories, so you should limit the amount you eat.  If you want a salad, choose a garden salad and ask for grilled meats. Avoid extra toppings like bacon, cheese, or fried items. Ask for the dressing on the side, or ask for olive oil and vinegar or lemon to use as dressing.  Estimate how many servings of a food you are given. For example, a serving of  cooked rice is  cup or about the size of half a baseball. Knowing serving sizes will help you be aware of how much food you are eating at restaurants. The list below tells you how big or small some common portion sizes are based on everyday objects: ? 1 oz-4 stacked dice. ? 3 oz-1 deck of cards. ? 1 tsp-1 die. ? 1 Tbsp- a ping-pong ball. ? 2 Tbsp-1 ping-pong ball. ?  cup- baseball. ? 1 cup-1 baseball. Summary  Calorie counting means keeping track of how many calories you eat and drink each day. If you eat fewer calories than your body needs, you should lose weight.  A healthy amount of weight to lose per week is usually 1-2 lb (0.5-0.9 kg). This usually means reducing your daily calorie intake by 500-750 calories.  The number of calories in a food can be found on a Nutrition Facts label. If a food does not have a Nutrition Facts label, try to look up the calories online or ask your dietitian for help.  Use your calories on foods and drinks that will fill you up, and not on foods and drinks that will leave you hungry.  Use smaller plates, glasses, and bowls to prevent overeating. This information is not intended to replace advice given to you by your health care provider. Make sure you discuss any questions you have with your health care provider. Document Released: 05/20/2005 Document Revised: 02/06/2018 Document Reviewed: 04/19/2016 Elsevier Patient Education  2020 Else[employer] Inc[employer] Heart Failure, Diagnosis  Heart failure means that your heart is not able to pump blood in the right way. This makes it hard for your body to work well. Heart failure is usually a long-term (chronic) condition. You must take good care of yourself and follow your treatment plan from your doctor. What are the causes? This condition may be caused by:  High blood pressure.  Build up of cholesterol and fat in the arteries.  Heart attack. This injures the heart muscle.  Heart valves that   do not open and  close properly.  Damage of the heart muscle. This is also called cardiomyopathy.  Lung disease.  Abnormal heart rhythms. What increases the risk? The risk of heart failure goes up as a person ages. This condition is also more likely to develop in people who:  Are overweight.  Are male.  Smoke or chew tobacco.  Abuse alcohol or illegal drugs.  Have taken medicines that can damage the heart.  Have diabetes.  Have abnormal heart rhythms.  Have thyroid problems.  Have low blood counts (anemia). What are the signs or symptoms? Symptoms of this condition include:  Shortness of breath.  Coughing.  Swelling of the feet, ankles, legs, or belly.  Losing weight for no reason.  Trouble breathing.  Waking from sleep because of the need to sit up and get more air.  Rapid heartbeat.  Being very tired.  Feeling dizzy, or feeling like you may pass out (faint).  Having no desire to eat.  Feeling like you may vomit (nauseous).  Peeing (urinating) more at night.  Feeling confused. How is this treated?     This condition may be treated with:  Medicines. These can be given to treat blood pressure and to make the heart muscles stronger.  Changes in your daily life. These may include eating a healthy diet, staying at a healthy body weight, quitting tobacco and illegal drug use, or doing exercises.  Surgery. Surgery can be done to open blocked valves, or to put devices in the heart, such as pacemakers.  A donor heart (heart transplant). You will receive a healthy heart from a donor. Follow these instructions at home:  Treat other conditions as told by your doctor. These may include high blood pressure, diabetes, thyroid disease, or abnormal heart rhythms.  Learn as much as you can about heart failure.  Get support as you need it.  Keep all follow-up visits as told by your doctor. This is important. Summary  Heart failure means that your heart is not able to pump  blood in the right way.  This condition is caused by high blood pressure, heart attack, or damage of the heart muscle.  Symptoms of this condition include shortness of breath and swelling of the feet, ankles, legs, or belly. You may also feel very tired or feel like you may vomit.  You may be treated with medicines, surgery, or changes in your daily life.  Treat other health conditions as told by your doctor. This information is not intended to replace advice given to you by your health care provider. Make sure you discuss any questions you have with your health care provider. Document Released: 02/27/2008 Document Revised: 08/07/2018 Document Reviewed: 08/07/2018 Elsevier Patient Education  2020 Elsevier Inc.  

## 2019-05-13 NOTE — Progress Notes (Signed)
Established Patient Office Visit  Subjective:  Patient ID: Bryce [name], male    DOB: 1965/10/25  Age: 53 y.o. MRN: [id number]  CC: No chief complaint on file.   HPI Bryce [name] presents for follow up of bilateral lower extremity edema.  He states that he continues to experience swelling to his bilateral lower extremity, he denies wheezing, experiences occasional shortness of breath with walking more than 5 blocks.  He is compliant with his medications, but states that he continues to gain weight.  He states that he checks his weight daily, but last night it  was 268.2 pounds.  He denies chest pain shortness of breath, dizziness, cough, bruises, hematuria, hematochezia and myalgia.  He reports that he will follow-up with [facility] nephrology at 2 PM with regards to an 18 mm exophytic hypodense lesion from the posterior interpolar right kidney which is not well characterized but may represent a cyst found on  CT scan of the abdomen/pelvis. He states that he is doing well but is concerned with his weight gain, and he offers no further complaint.  Past Medical History:  Diagnosis Date  . Chronic systolic congestive heart failure ([place])   . Coronary artery disease   . GERD (gastroesophageal reflux disease)   . Hypertension   . Ischemic cardiomyopathy   . MI (myocardial infarction) ([place])   . Migraine     Past Surgical History:  Procedure Laterality Date  . CARDIAC CATHETERIZATION N/A 12/28/2014   Procedure: Left Heart Cath and Coronary Angiography;  Surgeon: Yolonda Kida, MD;  Location: [facility];  Service: Cardiovascular;  Laterality: N/A;  . CORONARY ANGIOPLASTY     Non-ST elevation MI status post stenting within bare-metal stent of the 75% lesion of the LAD, 11/13/2010.   . CORONARY STENT INTERVENTION N/A 02/02/2017   Procedure: CORONARY/GRAFT ACUTE MI REVASCULARIZATION;  Surgeon: Yolonda Kida, MD;  Location: [facility];  Service: Cardiovascular;   Laterality: N/A;  . LEFT HEART CATH AND CORONARY ANGIOGRAPHY N/A 02/02/2017   Procedure: LEFT HEART CATH AND CORONARY ANGIOGRAPHY;  Surgeon: Yolonda Kida, MD;  Location: [facility];  Service: Cardiovascular;  Laterality: N/A;    Family History  Problem Relation Age of Onset  . CAD Father   . Kidney disease Neg Hx   . Diabetes Mellitus II Neg Hx     Social History   Socioeconomic History  . Marital status: Single    Spouse name: Not on file  . Number of children: Not on file  . Years of education: Not on file  . Highest education level: Not on file  Occupational History  . Occupation: Proofreader  Tobacco Use  . Smoking status: Current Some Day Smoker    Packs/day: 1.00    Years: 5.00    Pack years: 5.00    Types: Cigarettes    Last attempt to quit: 01/05/2017    Years since quitting: 2.3  . Smokeless tobacco: Never Used  . Tobacco comment: states smokes occasionally 10-12-18  Substance and Sexual Activity  . Alcohol use: Not Currently  . Drug use: Yes    Types: Marijuana  . Sexual activity: Yes  Other Topics Concern  . Not on file  Social History Narrative  . Not on file   Social Determinants of Health   Financial Resource Strain: Low Risk   . Difficulty of Paying Living Expenses: Not hard at all  Food Insecurity: No Food Insecurity  .  Worried About Charity fundraiser in the Last Year: Never true  . Ran Out of Food in the Last Year: Never true  Transportation Needs: No Transportation Needs  . Lack of Transportation (Medical): No  . Lack of Transportation (Non-Medical): No  Physical Activity: Sufficiently Active  . Days of Exercise per Week: 7 days  . Minutes of Exercise per Session: 30 min  Stress: No Stress Concern Present  . Feeling of Stress : Not at all  Social Connections: Somewhat Isolated  . Frequency of Communication with Friends and Family: More than three times a week  . Frequency of Social Gatherings with Friends and Family: More  than three times a week  . Attends Religious Services: Never  . Active Member of Clubs or Organizations: No  . Attends Archivist Meetings: Never  . Marital Status: Married  Human resources officer Violence: Not At Risk  . Fear of Current or Ex-Partner: No  . Emotionally Abused: No  . Physically Abused: No  . Sexually Abused: No    Outpatient Medications Prior to Visit  Medication Sig Dispense Refill  . aspirin EC 81 MG EC tablet Take 1 tablet (81 mg total) by mouth daily. 30 tablet 0  . aspirin-acetaminophen-caffeine (EXCEDRIN MIGRAINE) 250-250-65 MG tablet Take 1 tablet by mouth daily as needed for headache. 30 tablet 0  . clopidogrel (PLAVIX) 75 MG tablet TAKE ONE TABLET BY MOUTH EVERY DAY 90 tablet 0  . Cyanocobalamin (VITAMIN B 12) 100 MCG LOZG Take 1,000 mcg by mouth daily. 30 lozenge 3  . cyclobenzaprine (FLEXERIL) 10 MG tablet Take 1 tablet (10 mg total) by mouth 3 (three) times daily as needed. 90 tablet 0  . ezetimibe (ZETIA) 10 MG tablet Take 1 tablet (10 mg total) by mouth daily. 90 tablet 3  . furosemide (LASIX) 20 MG tablet Take 1 tablet (20 mg total) by mouth daily. 30 tablet 11  . gabapentin (NEURONTIN) 100 MG capsule TAKE ONE CAPSULE BY MOUTH 3 TIMES A DAY 90 capsule 0  . lisinopril (ZESTRIL) 20 MG tablet Take 1 tablet (20 mg total) by mouth daily. 90 tablet 1  . meloxicam (MOBIC) 15 MG tablet TAKE ONE TABLET BY MOUTH EVERY DAY AS NEEDED FOR PAIN 30 tablet 0  . metoprolol tartrate (LOPRESSOR) 25 MG tablet TAKE ONE TABLET BY MOUTH 2 TIMES A DAY 60 tablet 2  . nitroGLYCERIN (NITROSTAT) 0.4 MG SL tablet Place 1 tablet (0.4 mg total) under the tongue every 5 (five) minutes x 3 doses as needed for chest pain. 25 tablet 1  . pantoprazole (PROTONIX) 40 MG tablet Take 1 tablet (40 mg total) by mouth daily. 30 tablet 2  . rosuvastatin (CRESTOR) 40 MG tablet Take 1 tablet (40 mg total) by mouth daily. 90 tablet 3  . potassium chloride (KLOR-CON) 10 MEQ tablet Take 1 tablet (10  mEq total) by mouth daily. 30 tablet 11  . Blood Pressure KIT 1 kit by Does not apply route daily. 1 kit 0  . Docosanol (ABREVA) 10 % CREA Apply 1 application topically 2 (two) times daily. 1 Tube 0   No facility-administered medications prior to visit.    Allergies  Allergen Reactions  . Atorvastatin     Myalgias    ROS Review of Systems  Constitutional: Negative.   Respiratory: Negative.   Cardiovascular: Positive for leg swelling.  Gastrointestinal: Negative.   Skin: Negative.   Neurological: Negative.   Psychiatric/Behavioral: Negative.       Objective:  Physical Exam  Constitutional: He is oriented to person, place, and time. He appears well-developed.  HENT:  Head: Normocephalic and atraumatic.  Cardiovascular: Normal rate and regular rhythm.  Pulmonary/Chest: Effort normal. No respiratory distress. He has wheezes (Mild expiratory wheezes) in the right upper field, the right middle field, the right lower field, the left upper field, the left middle field and the left lower field. He exhibits no tenderness.  Abdominal: Bowel sounds are normal. He exhibits distension (central obesity, firm).  Musculoskeletal:        General: Edema (+1 edema bilateral lower extremities.) present.  Neurological: He is alert and oriented to person, place, and time.  Skin: Skin is warm and dry.  Psychiatric: He has a normal mood and affect. His behavior is normal. Judgment and thought content normal.    BP 137/87 (BP Location: Left Arm, Patient Position: Sitting)   Pulse 72   Ht _0  (1.727 m)   Wt 270 lb (122.5 kg)   SpO2 97%   BMI 41.05 kg/m  Wt Readings from Last 3 Encounters:  05/13/19 270 lb (122.5 kg)  04/08/19 257 lb 6.4 oz (116.8 kg)  04/06/19 259 lb 8 oz (117.7 kg)   He gained 13 pounds in 5 weeks and he was advised to continue his weight loss regimen.  Health Maintenance Due  Topic Date Due  . TETANUS/TDAP  10/11/1983  . COLONOSCOPY  10/11/2014    There are no  preventive care reminders to display for this patient.  Lab Results  Component Value Date   TSH 1.624 12/22/2014   Lab Results  Component Value Date   WBC 6.0 03/30/2019   HGB 14.4 03/30/2019   HCT 43.4 03/30/2019   MCV 94.1 03/30/2019   PLT 214 03/30/2019   Lab Results  Component Value Date   NA 138 04/14/2019   K 4.0 04/14/2019   CO2 26 04/14/2019   GLUCOSE 102 (H) 04/14/2019   BUN 22 (H) 04/14/2019   CREATININE 0.89 04/14/2019   BILITOT 0.8 04/14/2019   ALKPHOS 54 04/14/2019   AST 18 04/14/2019   ALT 29 04/14/2019   PROT 7.2 04/14/2019   ALBUMIN 4.1 04/14/2019   CALCIUM 9.0 04/14/2019   ANIONGAP 9 04/14/2019   Lab Results  Component Value Date   CHOL 188 04/14/2019   Lab Results  Component Value Date   HDL 49 04/14/2019   Lab Results  Component Value Date   LDLCALC 87 04/14/2019   Lab Results  Component Value Date   TRIG 261 (H) 04/14/2019   Lab Results  Component Value Date   CHOLHDL 3.8 04/14/2019   Lab Results  Component Value Date   HGBA1C 5.8 (H) 01/27/2019      Assessment & Plan:    1.  Bilateral lower extremity edema -He has mild expiratory wheezes and +1 edema to bilateral lower extremity.  He was advised to take metolazone 2.5 mg daily for 3 days, and 20 mEq potassium for 3 days.  He was advised to continue on DASH diet, and elevate legs while sitting down.  He was also encouraged to check and record his weight first thing in the morning and bring log to his appointment.  He will follow-up with the heart failure clinic on May 17, 2019.  He was advised to go to the emergency room with worsening wheezing, shortness of breath and bilateral lower extremity edema. - metolazone (ZAROXOLYN) 2.5 MG tablet; Take 1 tablet (2.5 mg total) by mouth daily.  Dispense:  3 tablet; Refill: 0 - potassium chloride (KLOR-CON) 10 MEQ tablet; Take 1 tablet (10 mEq total) by mouth daily. Also take 2 tablets ( 20 mEq for the next 3 days and continue on 10 mEq  daily)  Dispense: 30 tablet; Refill: 11  2. Congestive heart failure, unspecified HF chronicity, unspecified heart failure type ([place]) -Continue to monitor weights daily at home -Call office for signs and symptoms of acute heart failure as discussed during appointment today( weight gain, extremity swelling, chest pain, shortness of breath at rest-exertion or laying flat) -Continue current medications as directed. -Go to the Emergency room if symptoms are sudden and severe. -Limit fluid intake if swelling worsens. - metolazone (ZAROXOLYN) 2.5 MG tablet; Take 1 tablet (2.5 mg total) by mouth daily.  Dispense: 3 tablet; Refill: 0 - potassium chloride (KLOR-CON) 10 MEQ tablet; Take 1 tablet (10 mEq total) by mouth daily. Also take 2 tablets ( 20 mEq for the next 3 days and continue on 10 mEq daily)  Dispense: 30 tablet; Refill: 11    Follow-up: Return in about 27 days (around 06/09/2019), or if symptoms worsen or fail to improve.    Samaia Iwata Jerold Coombe, NP

## 2019-05-14 NOTE — Progress Notes (Deleted)
Patient ID: Bryce [name], male    DOB: 1965/07/18, 53 y.o.   MRN: [id number]  HPI  Bryce [name] is a 53 y/o male with a history of CAD (MI), HTN, GERD, tobacco use and chronic heart failure.   Echo report from 04/26/2019 reviewed and showed an EF of 45-50%. Echo report from 02/03/17 reviewed and showed an EF of 45-50% along with mild inferior hypokinesis.   Catheterization done 02/02/17 which showed:  Mid LAD lesion, 0 %stenosed.  Mid RCA lesion, 40 %stenosed.  A STENT XIENCE ALPINE RX 3.0X23 drug eluting stent was successfully placed, and does not overlap previously placed stent.  Dist Cx lesion, 100 %stenosed.  Post intervention, there is a 0% residual stenosis.  There is mild left ventricular systolic dysfunction.  LV end diastolic pressure is mildly elevated.  The left ventricular ejection fraction is 50-55% by visual estimate. Conclusion Successful PCI and stent to mid to distal circumflex with DES stent 3.023 mm xience Reducing the lesion from 100% down to 0 point TIMI-3 from 0 back to 3.  Was in the ED 03/30/2019 due to leg edema. CXR negative for pulmonary edema. Ultrasound negative for DVT. Released with oral diuretic. Was in the ED 03/17/2019 due to cellulitis of right arm where he was treated and released. Was in the ED 03/11/2019 due to elbow pain where he was treated and released. Was in the ED 03/07/2019 due to ankle pain after a mechanical fall. Diagnosed with nondisplaced avulsion fracture of the right talus and released with podiatry follow-up.   He presents today for a follow-up visit with a chief complaint of   Past Medical History:  Diagnosis Date  . Chronic systolic congestive heart failure (HCC)   . Coronary artery disease   . GERD (gastroesophageal reflux disease)   . Hypertension   . Ischemic cardiomyopathy   . MI (myocardial infarction) (HCC)   . Migraine    Past Surgical History:  Procedure Laterality Date  . CARDIAC CATHETERIZATION N/A 12/28/2014    Procedure: Left Heart Cath and Coronary Angiography;  Surgeon: Alwyn Pea, MD;  Location: [facility] INVASIVE CV [facility];  Service: Cardiovascular;  Laterality: N/A;  . CORONARY ANGIOPLASTY     Non-ST elevation MI status post stenting within bare-metal stent of the 75% lesion of the LAD, 11/13/2010.   . CORONARY STENT INTERVENTION N/A 02/02/2017   Procedure: CORONARY/GRAFT ACUTE MI REVASCULARIZATION;  Surgeon: Alwyn Pea, MD;  Location: [facility] INVASIVE CV [facility];  Service: Cardiovascular;  Laterality: N/A;  . LEFT HEART CATH AND CORONARY ANGIOGRAPHY N/A 02/02/2017   Procedure: LEFT HEART CATH AND CORONARY ANGIOGRAPHY;  Surgeon: Alwyn Pea, MD;  Location: [facility];  Service: Cardiovascular;  Laterality: N/A;   Family History  Problem Relation Age of Onset  . CAD Father   . Kidney disease Neg Hx   . Diabetes Mellitus II Neg Hx    Social History   Tobacco Use  . Smoking status: Current Some Day Smoker    Packs/day: 1.00    Years: 5.00    Pack years: 5.00    Types: Cigarettes    Last attempt to quit: 01/05/2017    Years since quitting: 2.3  . Smokeless tobacco: Never Used  . Tobacco comment: states smokes occasionally 10-12-18  Substance Use Topics  . Alcohol use: Not Currently   Allergies  Allergen Reactions  . Atorvastatin     Myalgias     Review of Systems  Constitutional: Positive for fatigue (tire  easily). Negative for appetite change.  HENT: Negative for congestion, postnasal drip and sore throat.   Eyes: Negative.   Respiratory: Positive for shortness of breath. Negative for cough.   Cardiovascular: Positive for chest pain (at times) and leg swelling. Negative for palpitations.  Gastrointestinal: Negative for abdominal distention and abdominal pain.  Endocrine: Negative.   Genitourinary: Negative.   Musculoskeletal: Negative for back pain and neck pain.  Skin: Negative.   Allergic/Immunologic: Negative.   Neurological: Positive for light-headedness  (when getting up too quickly). Negative for dizziness.  Hematological: Negative for adenopathy. Does not bruise/bleed easily.  Psychiatric/Behavioral: Positive for dysphoric mood. Negative for sleep disturbance (sleeping on 2 pillows).       Physical Exam Vitals and nursing note reviewed.  Constitutional:      Appearance: Normal appearance.  HENT:     Head: Normocephalic and atraumatic.  Cardiovascular:     Rate and Rhythm: Normal rate and regular rhythm.  Pulmonary:     Effort: Pulmonary effort is normal. No respiratory distress.     Breath sounds: No wheezing or rales.  Abdominal:     General: There is no distension.     Palpations: Abdomen is soft.  Musculoskeletal:        General: No tenderness.     Cervical back: Normal range of motion and neck supple.     Right lower leg: Edema (trace pitting) present.     Left lower leg: Edema (trace pitting) present.  Skin:    General: Skin is warm and dry.  Neurological:     General: No focal deficit present.     Mental Status: He is alert and oriented to person, place, and time.  Psychiatric:        Mood and Affect: Mood normal.        Behavior: Behavior normal.        Thought Content: Thought content normal.      Assessment & Plan:  1: Chronic heart failure with mildly reduced ejection fraction- - NYHA class III - euvolemic today - weighing daily and he was reminded to call for an overnight weight gain of >2 pounds or a weekly weight gain of >5 pounds - weight 257.6 from last visit here 6 weeks ago - adds salt "to everything" as he says that he gets "bad cramps"; encouraged him to not add salt to his food and to read food labels for sodium content - saw cardiology (Dunn) 04/06/2019  - BNP 03/30/2019 was 10.0 - elevating his legs when sitting for long periods of time; encouraged him to get compression socks and put them on in the morning with removal at bedtime  2: HTN- - BP looks good today - saw PCP (Iloabachie)  05/13/2019 - BMP 04/14/2019 reviewed and showed sodium 138, potassium 4.0, creatinine 0.89 and GFR >60  3: Tobacco use- - smokes "some" - complete cessation discussed - patient says that he had an xray done and "something showed up" but he wasn't told what and is waiting to get a CT done for further evaluation  Patient did not bring his medications nor a list. Each medication was verbally reviewed with the patient and he was encouraged to bring the bottles to every visit to confirm accuracy of list.

## 2019-05-17 ENCOUNTER — Ambulatory Visit: Payer: Medicaid Other | Admitting: Family

## 2019-05-17 ENCOUNTER — Telehealth: Payer: Self-pay | Admitting: Family

## 2019-05-17 NOTE — Telephone Encounter (Signed)
Patient did not show for his [facility] appointment on 05/17/2019. Will attempt to reschedule.

## 2019-05-26 ENCOUNTER — Emergency Department
Admission: EM | Admit: 2019-05-26 | Discharge: 2019-05-26 | Disposition: A | Discharge status: Home / Self Care | Payer: Medicaid Other | Attending: Student in an Organized Health Care Education/Training Program | Admitting: Student in an Organized Health Care Education/Training Program

## 2019-05-26 ENCOUNTER — Other Ambulatory Visit: Payer: Self-pay

## 2019-05-26 ENCOUNTER — Emergency Department: Payer: Medicaid Other

## 2019-05-26 ENCOUNTER — Encounter: Payer: Self-pay | Admitting: Emergency Medicine

## 2019-05-26 DIAGNOSIS — B353 Tinea pedis: Secondary | ICD-10-CM | POA: Insufficient documentation

## 2019-05-26 DIAGNOSIS — Z79899 Other long term (current) drug therapy: Secondary | ICD-10-CM | POA: Insufficient documentation

## 2019-05-26 DIAGNOSIS — I110 Hypertensive heart disease with heart failure: Secondary | ICD-10-CM | POA: Insufficient documentation

## 2019-05-26 DIAGNOSIS — I5022 Chronic systolic (congestive) heart failure: Secondary | ICD-10-CM | POA: Insufficient documentation

## 2019-05-26 DIAGNOSIS — Z7982 Long term (current) use of aspirin: Secondary | ICD-10-CM | POA: Insufficient documentation

## 2019-05-26 DIAGNOSIS — I2510 Atherosclerotic heart disease of native coronary artery without angina pectoris: Secondary | ICD-10-CM | POA: Insufficient documentation

## 2019-05-26 DIAGNOSIS — F17210 Nicotine dependence, cigarettes, uncomplicated: Secondary | ICD-10-CM | POA: Insufficient documentation

## 2019-05-26 DIAGNOSIS — Z7902 Long term (current) use of antithrombotics/antiplatelets: Secondary | ICD-10-CM | POA: Insufficient documentation

## 2019-05-26 LAB — CBC
HCT: 40.2 % (ref 39.0–52.0)
Hemoglobin: 14 g/dL (ref 13.0–17.0)
MCH: 31.7 pg (ref 26.0–34.0)
MCHC: 34.8 g/dL (ref 30.0–36.0)
MCV: 91 fL (ref 80.0–100.0)
Platelets: 197 10*3/uL (ref 150–400)
RBC: 4.42 MIL/uL (ref 4.22–5.81)
RDW: 12.9 % (ref 11.5–15.5)
WBC: 6.4 10*3/uL (ref 4.0–10.5)
nRBC: 0 % (ref 0.0–0.2)

## 2019-05-26 LAB — BASIC METABOLIC PANEL
Anion gap: 10 (ref 5–15)
BUN: 20 mg/dL (ref 6–20)
CO2: 26 mmol/L (ref 22–32)
Calcium: 9.3 mg/dL (ref 8.9–10.3)
Chloride: 104 mmol/L (ref 98–111)
Creatinine, Ser: 1.21 mg/dL (ref 0.61–1.24)
GFR calc Af Amer: >60 mL/min (ref >60)
GFR calc non Af Amer: >60 mL/min (ref >60)
Glucose, Bld: 113 mg/dL — ABNORMAL HIGH (ref 70–99)
Potassium: 4.1 mmol/L (ref 3.5–5.1)
Sodium: 140 mmol/L (ref 135–145)

## 2019-05-26 MED ORDER — TERBINAFINE HCL 1 % EX CREA: 1.0000 "application " | TOPICAL_CREAM | Freq: Two times a day (BID) | CUTANEOUS | 0 refills | Status: DC

## 2019-05-26 NOTE — ED Triage Notes (Signed)
Says he has chf since October and has recently gained wt.  Says he has a lot of fluid in legs.  Says he looked at his toes and has one place that is sore and his toes are looking green

## 2019-05-26 NOTE — ED Notes (Signed)
Pt presents to ED with c/o bilateral foot pain, pt states dx with CHF several months ago. Pt c/o burning behind his toes, BLE foot swelling, and pain to his L great toe nail. Pt with noted BLE foot swelling at this time, L great toe nail noted to be yellowish in color with thickening noted. Pt ambulatory without difficulty at this time. Pt repositioned in bed at this time for patient comfort.

## 2019-05-26 NOTE — ED Provider Notes (Signed)
[facility] Emergency Department Provider Note    First MD Initiated Contact with Patient 05/26/19 1837     (approximate)  I have reviewed the triage vital signs and the nursing notes.   HISTORY  Chief Complaint Foot Pain    HPI Bryce [name] is a 53 y.o. male below listed past medical history presents the ER for several days of burning in his feet particular between his toes.  Does have a history of CHF extensive coronary disease.  Denies any chest pain or shortness of breath.  Does have chronic swelling in his legs is on Lasix.  Feels that his weight is improving.  Denies any trauma.  Has noted some discoloration both of his great toenails.  States that he always wears socks and shoes  States he will immediately put socks on after getting out of the shower.    Past Medical History:  Diagnosis Date  . Chronic systolic congestive heart failure ([place])   . Coronary artery disease   . GERD (gastroesophageal reflux disease)   . Hypertension   . Ischemic cardiomyopathy   . MI (myocardial infarction) ([place])   . Migraine    Family History  Problem Relation Age of Onset  . CAD Father   . Kidney disease Neg Hx   . Diabetes Mellitus II Neg Hx    Past Surgical History:  Procedure Laterality Date  . CARDIAC CATHETERIZATION N/A 12/28/2014   Procedure: Left Heart Cath and Coronary Angiography;  Surgeon: Yolonda Kida, MD;  Location: [place] CV [facility];  Service: Cardiovascular;  Laterality: N/A;  . CORONARY ANGIOPLASTY     Non-ST elevation MI status post stenting within bare-metal stent of the 75% lesion of the LAD, 11/13/2010.   . CORONARY STENT INTERVENTION N/A 02/02/2017   Procedure: CORONARY/GRAFT ACUTE MI REVASCULARIZATION;  Surgeon: Yolonda Kida, MD;  Location: [place] CV LAB;  Service: Cardiovascular;  Laterality: N/A;  . LEFT HEART CATH AND CORONARY ANGIOGRAPHY N/A 02/02/2017   Procedure: LEFT HEART CATH AND CORONARY ANGIOGRAPHY;  Surgeon:  Yolonda Kida, MD;  Location: [place] CV LAB;  Service: Cardiovascular;  Laterality: N/A;   Patient Active Problem List   Diagnosis Date Noted  . Congestive heart failure (CHF) ([place]) 04/01/2019  . Vitamin B12 deficiency 04/01/2019  . Right arm pain 04/01/2019  . Bilateral lower extremity edema 04/01/2019  . Prediabetes 01/28/2019  . Erectile dysfunction 01/06/2019  . Numbness 01/06/2019  . History of migraine headaches 01/06/2019  . Atherosclerosis of native coronary artery of native heart with stable angina pectoris ([place]) 11/02/2018  . Acute renal failure (ARF) ([place]) 01/13/2018  . CAD (coronary artery disease) 02/20/2017  . Dehydration 02/20/2017  . STEMI (ST elevation myocardial infarction) ([place]) 02/02/2017  . AKI (acute kidney injury) ([place]) 12/15/2015  . Solitary pulmonary nodule 12/23/2014  . Chest pain 12/22/2014  . Hypertension 12/22/2014  . Hyperlipidemia 12/22/2014      Prior to Admission medications   Medication Sig Start Date End Date Taking? Authorizing Provider  aspirin EC 81 MG EC tablet Take 1 tablet (81 mg total) by mouth daily. 02/05/17   Epifanio Lesches, MD  aspirin-acetaminophen-caffeine (EXCEDRIN MIGRAINE) [phone] MG tablet Take 1 tablet by mouth daily as needed for headache. 01/06/19   Iloabachie, Chioma E, NP  Blood Pressure KIT 1 kit by Does not apply route daily. 04/01/19   Iloabachie, Chioma E, NP  clopidogrel (PLAVIX) 75 MG tablet TAKE ONE TABLET BY MOUTH EVERY DAY 09/17/18  Iloabachie, Chioma E, NP  Cyanocobalamin (VITAMIN B 12) 100 MCG LOZG Take 1,000 mcg by mouth daily. 04/01/19   Iloabachie, Chioma E, NP  cyclobenzaprine (FLEXERIL) 10 MG tablet Take 1 tablet (10 mg total) by mouth 3 (three) times daily as needed. 04/06/19   Dunn, Areta Haber, PA-C  Docosanol (ABREVA) 10 % CREA Apply 1 application topically 2 (two) times daily. 01/15/18   Dustin Flock, MD  ezetimibe (ZETIA) 10 MG tablet Take 1 tablet (10 mg total) by mouth daily. 11/03/18  05/13/19  Minna Merritts, MD  furosemide (LASIX) 20 MG tablet Take 1 tablet (20 mg total) by mouth daily. 04/06/19 05/13/19  Dunn, Areta Haber, PA-C  gabapentin (NEURONTIN) 100 MG capsule TAKE ONE CAPSULE BY MOUTH 3 TIMES A DAY 05/04/19   Iloabachie, Chioma E, NP  lisinopril (ZESTRIL) 20 MG tablet Take 1 tablet (20 mg total) by mouth daily. 04/01/19   Iloabachie, Chioma E, NP  meloxicam (MOBIC) 15 MG tablet TAKE ONE TABLET BY MOUTH EVERY DAY AS NEEDED FOR PAIN 05/04/19   Iloabachie, Chioma E, NP  metolazone (ZAROXOLYN) 2.5 MG tablet Take 1 tablet (2.5 mg total) by mouth daily. 05/13/19   Iloabachie, Chioma E, NP  metoprolol tartrate (LOPRESSOR) 25 MG tablet TAKE ONE TABLET BY MOUTH 2 TIMES A DAY 04/01/19   Iloabachie, Chioma E, NP  nitroGLYCERIN (NITROSTAT) 0.4 MG SL tablet Place 1 tablet (0.4 mg total) under the tongue every 5 (five) minutes x 3 doses as needed for chest pain. 04/06/19   Dunn, Areta Haber, PA-C  pantoprazole (PROTONIX) 40 MG tablet Take 1 tablet (40 mg total) by mouth daily. 05/10/19   Iloabachie, Chioma E, NP  potassium chloride (KLOR-CON) 10 MEQ tablet Take 1 tablet (10 mEq total) by mouth daily. Also take 2 tablets ( 20 mEq for the next 3 days and continue on 10 mEq daily) 05/13/19   Iloabachie, Chioma E, NP  rosuvastatin (CRESTOR) 40 MG tablet Take 1 tablet (40 mg total) by mouth daily. 04/07/19 07/06/19  Rise Mu, PA-C  terbinafine (ATHLETES FOOT AF) 1 % cream Apply 1 application topically 2 (two) times daily. 05/26/19   Merlyn Lot, MD    Allergies Atorvastatin    Social History Social History   Tobacco Use  . Smoking status: Current Some Day Smoker    Packs/day: 1.00    Years: 5.00    Pack years: 5.00    Types: Cigarettes    Last attempt to quit: 01/05/2017    Years since quitting: 2.3  . Smokeless tobacco: Never Used  . Tobacco comment: states smokes occasionally 10-12-18  Substance Use Topics  . Alcohol use: Not Currently  . Drug use: Yes    Types: Marijuana     Review of Systems Patient denies headaches, rhinorrhea, blurry vision, numbness, shortness of breath, chest pain, edema, cough, abdominal pain, nausea, vomiting, diarrhea, dysuria, fevers, rashes or hallucinations unless otherwise stated above in HPI. ____________________________________________   PHYSICAL EXAM:  VITAL SIGNS: Vitals:   05/26/19 1710  BP: 121/76  Pulse: 71  Resp: 16  Temp: 98.6 F (37 C)  SpO2: 100%    Constitutional: Alert and oriented.  Eyes: Conjunctivae are normal.  Head: Atraumatic. Nose: No congestion/rhinnorhea. Mouth/Throat: Mucous membranes are moist.   Neck: No stridor. Painless ROM.  Cardiovascular: Normal rate, regular rhythm. Grossly normal heart sounds.  Good peripheral circulation. Respiratory: Normal respiratory effort.  No retractions. Lungs CTAB. Gastrointestinal: Soft and nontender. No distention. No abdominal bruits. No CVA tenderness. Musculoskeletal:  No lower extremity tenderness 1+ BLE  edema. Strong DP and PT pulses bilaterally.  Brisk cap refill.   No joint effusions. Neurologic:  Normal speech and language. No gross focal neurologic deficits are appreciated. No facial droop Skin:  Skin is warm, dry and intact.  Erythema irritation externally lesions in between toes bilateral feet consistent with athlete's foot.  No overlying cellulitis.  No fluctuance.  No crepitus.  No purulence. Psychiatric: Mood and affect are normal. Speech and behavior are normal.  ____________________________________________   LABS (all labs ordered are listed, but only abnormal results are displayed)  Results for orders placed or performed during the hospital encounter of 05/26/19 (from the past 24 hour(s))  Basic metabolic panel     Status: Abnormal   Collection Time: 05/26/19  5:24 PM  Result Value Ref Range   Sodium 140 135 - 145 mmol/L   Potassium 4.1 3.5 - 5.1 mmol/L   Chloride 104 98 - 111 mmol/L   CO2 26 22 - 32 mmol/L   Glucose, Bld 113 (H) 70  - 99 mg/dL   BUN 20 6 - 20 mg/dL   Creatinine, Ser 1.21 0.61 - 1.24 mg/dL   Calcium 9.3 8.9 - 10.3 mg/dL   GFR calc non Af Amer >60 >60 mL/min   GFR calc Af Amer >60 >60 mL/min   Anion gap 10 5 - 15  CBC     Status: None   Collection Time: 05/26/19  5:24 PM  Result Value Ref Range   WBC 6.4 4.0 - 10.5 K/uL   RBC 4.42 4.22 - 5.81 MIL/uL   Hemoglobin 14.0 13.0 - 17.0 g/dL   HCT 40.2 39.0 - 52.0 %   MCV 91.0 80.0 - 100.0 fL   MCH 31.7 26.0 - 34.0 pg   MCHC 34.8 30.0 - 36.0 g/dL   RDW 12.9 11.5 - 15.5 %   Platelets 197 150 - 400 K/uL   nRBC 0.0 0.0 - 0.2 %   ____________________________________________  EKG My review and personal interpretation at Time: 17:19   Indication: foot pain  Rate: 70  Rhythm: sinus Axis: normal Other: diffuse t wave inversions c/w previous ____________________________________________  RADIOLOGY  I personally reviewed all radiographic images ordered to evaluate for the above acute complaints and reviewed radiology reports and findings.  These findings were personally discussed with the patient.  Please see medical record for radiology report.  ____________________________________________   PROCEDURES  Procedure(s) performed:  Procedures    Critical Care performed: no ____________________________________________   INITIAL IMPRESSION / ASSESSMENT AND PLAN / ED COURSE  Pertinent labs & imaging results that were available during my care of the patient were reviewed by me and considered in my medical decision making (see chart for details).   DDX: tinea, cellulitis,  Edema, ischemia, claudication  Bryce [name] is a 53 y.o. who presents to the ED with symptoms as described above patient in no acute distress.  Exam reassuring.  Medical history none.  Denies any chest pain.  Not consistent with acute heart failure.  Not consistent with ischemic limb claudication.  Not consistent with cellulitis.  Most consistent with athlete's foot and tinea.   Discussed symptomatic management will prescribe treatment discussed outpatient follow-up.  Have discussed with the patient and available family all diagnostics and treatments performed thus far and all questions were answered to the best of my ability. The patient demonstrates understanding and agreement with plan.      The patient was evaluated in Emergency Department  today for the symptoms described in the history of present illness. He/she was evaluated in the context of the global COVID-19 pandemic, which necessitated consideration that the patient might be at risk for infection with the SARS-CoV-2 virus that causes COVID-19. Institutional protocols and algorithms that pertain to the evaluation of patients at risk for COVID-19 are in a state of rapid change based on information released by regulatory bodies including the CDC and federal and state organizations. These policies and algorithms were followed during the patient's care in the ED.  As part of my medical decision making, I reviewed the following data within the [facility] notes reviewed and incorporated, Labs reviewed, notes from prior ED visits and [place] Controlled Substance Database   ____________________________________________   FINAL CLINICAL IMPRESSION(S) / ED DIAGNOSES  Final diagnoses:  Tinea pedis of both feet      NEW MEDICATIONS STARTED DURING THIS VISIT:  Current Discharge Medication List    START taking these medications   Details  terbinafine (ATHLETES FOOT AF) 1 % cream Apply 1 application topically 2 (two) times daily. Qty: 30 g, Refills: 0         Note:  This document was prepared using Dragon voice recognition software and may include unintentional dictation errors.    Merlyn Lot, MD 05/26/19 [phone]

## 2019-05-29 NOTE — Progress Notes (Signed)
Patient ID: Bryce [name], male    DOB: 1965/12/04, 53 y.o.   MRN: [id number]  HPI  Bryce [name] is a 53 y/o male with a history of CAD (MI), HTN, GERD, tobacco use and chronic heart failure.   Echo report from 04/26/2019 reviewed and showed an EF of 45-50%. Echo report from 02/03/17 reviewed and showed an EF of 45-50% along with mild inferior hypokinesis.   Catheterization done 02/02/17 which showed:  Mid LAD lesion, 0 %stenosed.  Mid RCA lesion, 40 %stenosed.  A STENT XIENCE ALPINE RX 3.0X23 drug eluting stent was successfully placed, and does not overlap previously placed stent.  Dist Cx lesion, 100 %stenosed.  Post intervention, there is a 0% residual stenosis.  There is mild left ventricular systolic dysfunction.  LV end diastolic pressure is mildly elevated.  The left ventricular ejection fraction is 50-55% by visual estimate. Conclusion Successful PCI and stent to mid to distal circumflex with DES stent 3.023 mm xience Reducing the lesion from 100% down to 0 point TIMI-3 from 0 back to 3.  Was in the ED 05/26/2019 due to tinea pedis of both feet. Evaluated and released. Was in the ED 03/30/2019 due to leg edema. CXR negative for pulmonary edema. Ultrasound negative for DVT. Released with oral diuretic. Was in the ED 03/17/2019 due to cellulitis of right arm where he was treated and released. Was in the ED 03/11/2019 due to elbow pain where he was treated and released. Was in the ED 03/07/2019 due to ankle pain after a mechanical fall. Diagnosed with nondisplaced avulsion fracture of the right talus and released with podiatry follow-up.   He presents today for a follow-up visit with a chief complaint of moderate fatigue upon minimal exertion. He describes this as chronic in nature having been present for several years. He has associated shortness of breath, intermittent chest pain, pedal edema, depression and gradual weight gain along with this. He denies any difficulty sleeping,  dizziness, abdominal distention or palpitations. Has been experiencing leg cramps intermittently.   Past Medical History:  Diagnosis Date  . Chronic systolic congestive heart failure ([place])   . Coronary artery disease   . GERD (gastroesophageal reflux disease)   . Hypertension   . Ischemic cardiomyopathy   . MI (myocardial infarction) ([place])   . Migraine    Past Surgical History:  Procedure Laterality Date  . CARDIAC CATHETERIZATION N/A 12/28/2014   Procedure: Left Heart Cath and Coronary Angiography;  Surgeon: Yolonda Kida, MD;  Location: [place] [facility];  Service: Cardiovascular;  Laterality: N/A;  . CORONARY ANGIOPLASTY     Non-ST elevation MI status post stenting within bare-metal stent of the 75% lesion of the LAD, 11/13/2010.   . CORONARY STENT INTERVENTION N/A 02/02/2017   Procedure: CORONARY/GRAFT ACUTE MI REVASCULARIZATION;  Surgeon: Yolonda Kida, MD;  Location: [place] [facility];  Service: Cardiovascular;  Laterality: N/A;  . LEFT HEART CATH AND CORONARY ANGIOGRAPHY N/A 02/02/2017   Procedure: LEFT HEART CATH AND CORONARY ANGIOGRAPHY;  Surgeon: Yolonda Kida, MD;  Location: [facility];  Service: Cardiovascular;  Laterality: N/A;   Family History  Problem Relation Age of Onset  . CAD Father   . Kidney disease Neg Hx   . Diabetes Mellitus II Neg Hx    Social History   Tobacco Use  . Smoking status: Current Some Day Smoker    Packs/day: 1.00    Years: 5.00    Pack years: 5.00  Types: Cigarettes    Last attempt to quit: 01/05/2017    Years since quitting: 2.3  . Smokeless tobacco: Never Used  . Tobacco comment: states smokes occasionally 10-12-18  Substance Use Topics  . Alcohol use: Not Currently   Allergies  Allergen Reactions  . Atorvastatin     Myalgias   Prior to Admission medications   Medication Sig Start Date End Date Taking? Authorizing Provider  aspirin EC 81 MG EC tablet Take 1 tablet (81 mg total) by mouth daily. 02/05/17   Yes Epifanio Lesches, MD  aspirin-acetaminophen-caffeine (EXCEDRIN MIGRAINE) [phone] MG tablet Take 1 tablet by mouth daily as needed for headache. 01/06/19  Yes Iloabachie, Chioma E, NP  Blood Pressure KIT 1 kit by Does not apply route daily. 04/01/19  Yes Iloabachie, Chioma E, NP  clopidogrel (PLAVIX) 75 MG tablet TAKE ONE TABLET BY MOUTH EVERY DAY 09/17/18  Yes Iloabachie, Chioma E, NP  Cyanocobalamin (VITAMIN B 12) 100 MCG LOZG Take 1,000 mcg by mouth daily. 04/01/19  Yes Iloabachie, Chioma E, NP  cyclobenzaprine (FLEXERIL) 10 MG tablet Take 1 tablet (10 mg total) by mouth 3 (three) times daily as needed. 04/06/19  Yes Dunn, Ryan M, PA-C  Docosanol (ABREVA) 10 % CREA Apply 1 application topically 2 (two) times daily. 01/15/18  Yes Dustin Flock, MD  ezetimibe (ZETIA) 10 MG tablet Take 1 tablet (10 mg total) by mouth daily. 11/03/18 05/31/19 Yes Gollan, Kathlene November, MD  furosemide (LASIX) 20 MG tablet Take 1 tablet (20 mg total) by mouth daily. 04/06/19 05/31/19 Yes Dunn, Areta Haber, PA-C  gabapentin (NEURONTIN) 100 MG capsule TAKE ONE CAPSULE BY MOUTH 3 TIMES A DAY 05/04/19  Yes Iloabachie, Chioma E, NP  lisinopril (ZESTRIL) 20 MG tablet Take 1 tablet (20 mg total) by mouth daily. 04/01/19  Yes Iloabachie, Chioma E, NP  meloxicam (MOBIC) 15 MG tablet TAKE ONE TABLET BY MOUTH EVERY DAY AS NEEDED FOR PAIN 05/04/19  Yes Iloabachie, Chioma E, NP  metoprolol tartrate (LOPRESSOR) 25 MG tablet TAKE ONE TABLET BY MOUTH 2 TIMES A DAY 04/01/19  Yes Iloabachie, Chioma E, NP  nitroGLYCERIN (NITROSTAT) 0.4 MG SL tablet Place 1 tablet (0.4 mg total) under the tongue every 5 (five) minutes x 3 doses as needed for chest pain. 04/06/19  Yes Dunn, Areta Haber, PA-C  pantoprazole (PROTONIX) 40 MG tablet Take 1 tablet (40 mg total) by mouth daily. 05/10/19  Yes Iloabachie, Chioma E, NP  potassium chloride (KLOR-CON) 10 MEQ tablet Take 1 tablet (10 mEq total) by mouth daily. Also take 2 tablets ( 20 mEq for the next 3 days and  continue on 10 mEq daily) 05/13/19  Yes Iloabachie, Chioma E, NP  rosuvastatin (CRESTOR) 40 MG tablet Take 1 tablet (40 mg total) by mouth daily. 04/07/19 07/06/19 Yes Dunn, Areta Haber, PA-C  terbinafine (ATHLETES FOOT AF) 1 % cream Apply 1 application topically 2 (two) times daily. 05/26/19  Yes Merlyn Lot, MD     Review of Systems  Constitutional: Positive for fatigue (tire easily). Negative for appetite change.  HENT: Negative for congestion, postnasal drip and sore throat.   Eyes: Negative.   Respiratory: Positive for shortness of breath. Negative for cough.   Cardiovascular: Positive for chest pain (last week) and leg swelling. Negative for palpitations.  Gastrointestinal: Negative for abdominal distention and abdominal pain.  Endocrine: Negative.   Genitourinary: Negative.   Musculoskeletal: Negative for back pain and neck pain.  Skin: Negative.   Allergic/Immunologic: Negative.   Neurological: Negative for  dizziness and light-headedness.  Hematological: Negative for adenopathy. Does not bruise/bleed easily.  Psychiatric/Behavioral: Positive for dysphoric mood. Negative for sleep disturbance (sleeping on 2 pillows).   Vitals:   05/31/19 0833  BP: (!) 144/98  Pulse: 82  Resp: 18  SpO2: 99%  Weight: 263 lb 9.6 oz (119.6 kg)  Height: _0  (1.727 m)   Wt Readings from Last 3 Encounters:  05/31/19 263 lb 9.6 oz (119.6 kg)  05/26/19 264 lb (119.7 kg)  05/13/19 270 lb (122.5 kg)   Lab Results  Component Value Date   CREATININE 1.21 05/26/2019   CREATININE 0.89 04/14/2019   CREATININE 1.13 04/06/2019    Physical Exam Vitals and nursing note reviewed.  Constitutional:      Appearance: Normal appearance.  HENT:     Head: Normocephalic and atraumatic.  Cardiovascular:     Rate and Rhythm: Normal rate and regular rhythm.  Pulmonary:     Effort: Pulmonary effort is normal. No respiratory distress.     Breath sounds: No wheezing or rales.  Abdominal:     General: There  is no distension.     Palpations: Abdomen is soft.  Musculoskeletal:        General: No tenderness.     Cervical back: Normal range of motion and neck supple.     Right lower leg: No tenderness. Edema (1+ pitting) present.     Left lower leg: No tenderness. Edema (1+ pitting) present.  Skin:    General: Skin is warm and dry.  Neurological:     General: No focal deficit present.     Mental Status: He is alert and oriented to person, place, and time.  Psychiatric:        Mood and Affect: Mood normal.        Behavior: Behavior normal.        Thought Content: Thought content normal.      Assessment & Plan:  1: Chronic heart failure with mildly reduced ejection fraction- - NYHA class III - minimally fluid overloaded today with increased edema and weight gain - weighing daily and he was reminded to call for an overnight weight gain of >2 pounds or a weekly weight gain of >5 pounds - weight up 6 pounds from last visit here 6 weeks ago - adds salt "to everything" as he says that he gets "bad cramps"; encouraged him to not add salt to his food and to read food labels for sodium content - will increase his furosemide to 68m daily along with increasing his potassium to 214m daily - instructed to get magnesium and take 1 tablet daily to see if that helps with his leg cramps - saw cardiology (Dunn) 04/06/2019 & returns 06/07/19 - BNP 03/30/2019 was 10.0 - elevating his legs when sitting for long periods of time; encouraged him to get compression socks and put them on in the morning with removal at bedtime  2: HTN- - BP mildly elevated today although weight is up 6 pounds - saw PCP (IHattie Perch12/03/2019 & returns 06/09/19 - BMP 05/26/2019 reviewed and showed sodium 140, potassium 4.1, creatinine 1.21 and GFR >60  3: Tobacco use- - smokes "some" - complete cessation discussed for 3 minutes   Patient did not bring his medications nor a list. Each medication was verbally reviewed with the  patient and he was encouraged to bring the bottles to every visit to confirm accuracy of list.  Since he is seeing cardiology and PCP next week, will have patient  return in 3 weeks or sooner for any questions/problems before then.

## 2019-05-31 ENCOUNTER — Encounter: Payer: Self-pay | Admitting: Family

## 2019-05-31 ENCOUNTER — Ambulatory Visit: Payer: Medicaid Other | Attending: Family | Admitting: Family

## 2019-05-31 ENCOUNTER — Other Ambulatory Visit: Payer: Self-pay | Admitting: Gerontology

## 2019-05-31 ENCOUNTER — Other Ambulatory Visit: Payer: Self-pay

## 2019-05-31 VITALS — BP 144/98 | HR 82 | Resp 18 | Ht 68.0 in | Wt 263.6 lb

## 2019-05-31 DIAGNOSIS — I252 Old myocardial infarction: Secondary | ICD-10-CM | POA: Insufficient documentation

## 2019-05-31 DIAGNOSIS — Z79899 Other long term (current) drug therapy: Secondary | ICD-10-CM | POA: Insufficient documentation

## 2019-05-31 DIAGNOSIS — Z955 Presence of coronary angioplasty implant and graft: Secondary | ICD-10-CM | POA: Insufficient documentation

## 2019-05-31 DIAGNOSIS — I5022 Chronic systolic (congestive) heart failure: Secondary | ICD-10-CM | POA: Insufficient documentation

## 2019-05-31 DIAGNOSIS — I10 Essential (primary) hypertension: Secondary | ICD-10-CM

## 2019-05-31 DIAGNOSIS — I110 Hypertensive heart disease with heart failure: Secondary | ICD-10-CM | POA: Insufficient documentation

## 2019-05-31 DIAGNOSIS — Z888 Allergy status to other drugs, medicaments and biological substances status: Secondary | ICD-10-CM | POA: Insufficient documentation

## 2019-05-31 DIAGNOSIS — I2510 Atherosclerotic heart disease of native coronary artery without angina pectoris: Secondary | ICD-10-CM | POA: Insufficient documentation

## 2019-05-31 DIAGNOSIS — K219 Gastro-esophageal reflux disease without esophagitis: Secondary | ICD-10-CM | POA: Insufficient documentation

## 2019-05-31 DIAGNOSIS — I509 Heart failure, unspecified: Secondary | ICD-10-CM

## 2019-05-31 DIAGNOSIS — Z8249 Family history of ischemic heart disease and other diseases of the circulatory system: Secondary | ICD-10-CM | POA: Insufficient documentation

## 2019-05-31 DIAGNOSIS — F17210 Nicotine dependence, cigarettes, uncomplicated: Secondary | ICD-10-CM | POA: Insufficient documentation

## 2019-05-31 DIAGNOSIS — Z7982 Long term (current) use of aspirin: Secondary | ICD-10-CM | POA: Insufficient documentation

## 2019-05-31 DIAGNOSIS — F17200 Nicotine dependence, unspecified, uncomplicated: Secondary | ICD-10-CM

## 2019-05-31 DIAGNOSIS — I255 Ischemic cardiomyopathy: Secondary | ICD-10-CM | POA: Insufficient documentation

## 2019-05-31 DIAGNOSIS — Z7902 Long term (current) use of antithrombotics/antiplatelets: Secondary | ICD-10-CM | POA: Insufficient documentation

## 2019-05-31 DIAGNOSIS — R600 Localized edema: Secondary | ICD-10-CM

## 2019-05-31 MED ORDER — POTASSIUM CHLORIDE CRYS ER 10 MEQ PO TBCR: 20.0000 meq | EXTENDED_RELEASE_TABLET | Freq: Every day | ORAL | 3 refills | Status: DC

## 2019-05-31 MED ORDER — FUROSEMIDE 20 MG PO TABS: 40.0000 mg | ORAL_TABLET | Freq: Every day | ORAL | 3 refills | Status: DC

## 2019-05-31 NOTE — Patient Instructions (Addendum)
Continue weighing daily and call for an overnight weight gain of > 2 pounds or a weekly weight gain of >5 pounds.  Increase furosemide to 2 tablets daily along with increasing potassium to 2 tablets daily.   Get over the counter magnesium and take 1 tablet daily.

## 2019-06-05 NOTE — Progress Notes (Signed)
`       Evaluation Performed:  Follow-up visit  Date:  06/07/2019   ID:  Bryce [name], Bryce [name] 1966/04/21, MRN [id number]  Patient Location:  [place] [address]   Provider location:   [facility], [place] office  PCP:  Langston Reusing, NP  Cardiologist:  Patsy Baltimore  Chief Complaint  Patient presents with  . office visit    2 month F/U-Meds concerns/Patient reports cramping in legs, hands, and feet; Meds verbally reviewed with patient.    History of Present Illness:    Bryce [name] is a 54 y.o. male  past medical history of 1. Status post non-STEMI, BMS LAD 11/13/2010 2. Status post lateral wall STEMI 02/02/2017 3. Status post DES mid to distal left circumflex 02/02/2017 4. Essential hypertension 5. Hyperlipidemia 6. Tobacco abuse Presenting for f/u of his CAD, prior PCI  Frequent trips to ER for leg swelling and SOB in 03/2019  Stress test 11/2018: low risk  Echo 04/2019 records reviewed  1. Left ventricular ejection fraction, by visual estimation, is 45 to 50%. The left ventricle has mildly decreased function. There is mildly increased left ventricular hypertrophy.  2. Global right ventricle has normal systolic function.The right ventricular size is normal. No increase in right ventricular wall thickness.  3. Left atrial size was mildly dilated.  5. The mitral valve is normal in structure. No evidence of mitral valve regurgitation. No evidence of mitral stenosis.  6. The tricuspid valve is normal in structure. Tricuspid valve regurgitation is not demonstrated.  9. The inferior vena cava is dilated in size with >50% respiratory variability, suggesting right atrial pressure of 8 mmHg. 10. Small atrial septal defect with predominantly left to right shunting across the atrial septum.  Weight up to 270 Now watching diet, down 10 pounds  Mistake on lasix , was taking 80 daily Potassium 40 daily Was supposed to take 1/2   doses  High fluid intake Still with pitting edema R>L  EKG personally reviewed by myself on todays visit NSR rate 63 bpm    Prior records reviewed [facility] 02/02/2017 with lateral wall ST elevation myocardial infarction.  Coronary angiography revealed 100% stenosis of distal left circumflex, and normal left ventricular function.  -- PCI to the mid to distal left circumflex with a 3.0 x 23 mm Xience Alpine DES.   2D echocardiogram on 02/03/17 revealed borderline mildly reduced left ventricular function with LVEF 45-50%.   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   He restarted metoprolol and lisinopril on 03/09/2017.   LDL cholesterol is 119 on 03/02/2017,  14 days ago, still 120 on pravastatin.   2-3 months ago,swelling, fatigue,  Went to the ER, BP elevated Metoprolol increased to 25 BID Rate 119 bpm, LBBB new Initial TNT normal, not rechecked BNP 15  Feels better no energy, Denies more tachycardia  Last stress test 12/2014   Prior CV studies:   The following studies were reviewed today:    Past Medical History:  Diagnosis Date  . Chronic systolic congestive heart failure ([place])   . Coronary artery disease   . GERD (gastroesophageal reflux disease)   . Hypertension   . Ischemic cardiomyopathy   . MI (myocardial infarction) ([place])   . Migraine    Past Surgical History:  Procedure Laterality Date  . CARDIAC CATHETERIZATION N/A 12/28/2014   Procedure: Left Heart Cath and Coronary Angiography;  Surgeon: Yolonda Kida, MD;  Location: [place] [facility];  Service: Cardiovascular;  Laterality: N/A;  . CORONARY ANGIOPLASTY     Non-ST elevation MI status post stenting within bare-metal stent of the 75% lesion of the LAD, 11/13/2010.   . CORONARY STENT INTERVENTION N/A 02/02/2017   Procedure: CORONARY/GRAFT ACUTE MI REVASCULARIZATION;  Surgeon: Yolonda Kida, MD;  Location:  [place] [facility];  Service: Cardiovascular;  Laterality: N/A;  . LEFT HEART CATH AND CORONARY ANGIOGRAPHY N/A 02/02/2017   Procedure: LEFT HEART CATH AND CORONARY ANGIOGRAPHY;  Surgeon: Yolonda Kida, MD;  Location: [place] [facility];  Service: Cardiovascular;  Laterality: N/A;     Current Meds  Medication Sig  . aspirin EC 81 MG EC tablet Take 1 tablet (81 mg total) by mouth daily.  Marland Kitchen aspirin-acetaminophen-caffeine (EXCEDRIN MIGRAINE) 250-250-65 MG tablet Take 1 tablet by mouth daily as needed for headache.  . Blood Pressure KIT 1 kit by Does not apply route daily.  . clopidogrel (PLAVIX) 75 MG tablet TAKE ONE TABLET BY MOUTH EVERY DAY  . Docosanol (ABREVA) 10 % CREA Apply 1 application topically 2 (two) times daily. (Patient taking differently: Apply 1 application topically 2 (two) times daily as needed. )  . ezetimibe (ZETIA) 10 MG tablet Take 1 tablet (10 mg total) by mouth daily.  . furosemide (LASIX) 20 MG tablet Take 2 tablets (40 mg total) by mouth daily. (Patient taking differently: Take 40 mg by mouth 2 (two) times daily. )  . gabapentin (NEURONTIN) 100 MG capsule TAKE ONE CAPSULE BY MOUTH 3 TIMES A DAY  . lisinopril (ZESTRIL) 20 MG tablet Take 1 tablet (20 mg total) by mouth daily.  . meloxicam (MOBIC) 15 MG tablet TAKE ONE TABLET BY MOUTH EVERY DAY AS NEEDED FOR PAIN  . metoprolol tartrate (LOPRESSOR) 25 MG tablet TAKE ONE TABLET BY MOUTH 2 TIMES A DAY  . nitroGLYCERIN (NITROSTAT) 0.4 MG SL tablet Place 1 tablet (0.4 mg total) under the tongue every 5 (five) minutes x 3 doses as needed for chest pain.  . pantoprazole (PROTONIX) 40 MG tablet Take 1 tablet (40 mg total) by mouth daily.  . potassium chloride (KLOR-CON) 10 MEQ tablet Take 2 tablets (20 mEq total) by mouth daily. (Patient taking differently: Take 40 mEq by mouth daily. )  . rosuvastatin (CRESTOR) 40 MG tablet Take 1 tablet (40 mg total) by mouth daily.  Marland Kitchen terbinafine (ATHLETES FOOT AF) 1 % cream Apply 1  application topically 2 (two) times daily.     Allergies:   Atorvastatin   Social History   Tobacco Use  . Smoking status: Current Some Day Smoker    Packs/day: 1.00    Years: 5.00    Pack years: 5.00    Types: Cigarettes    Last attempt to quit: 01/05/2017    Years since quitting: 2.4  . Smokeless tobacco: Never Used  . Tobacco comment: states smokes occasionally 10-12-18  Substance Use Topics  . Alcohol use: Not Currently  . Drug use: Yes    Types: Marijuana     Current Outpatient Medications on File Prior to Visit  Medication Sig Dispense Refill  . aspirin EC 81 MG EC tablet Take 1 tablet (81 mg total) by mouth daily. 30 tablet 0  . aspirin-acetaminophen-caffeine (EXCEDRIN MIGRAINE) 250-250-65 MG tablet Take 1 tablet by mouth daily as needed for headache. 30 tablet 0  . Blood Pressure KIT 1 kit by Does not apply route daily. 1 kit 0  .  clopidogrel (PLAVIX) 75 MG tablet TAKE ONE TABLET BY MOUTH EVERY DAY 90 tablet 0  . Docosanol (ABREVA) 10 % CREA Apply 1 application topically 2 (two) times daily. (Patient taking differently: Apply 1 application topically 2 (two) times daily as needed. ) 1 Tube 0  . ezetimibe (ZETIA) 10 MG tablet Take 1 tablet (10 mg total) by mouth daily. 90 tablet 3  . furosemide (LASIX) 20 MG tablet Take 2 tablets (40 mg total) by mouth daily. (Patient taking differently: Take 40 mg by mouth 2 (two) times daily. ) 180 tablet 3  . gabapentin (NEURONTIN) 100 MG capsule TAKE ONE CAPSULE BY MOUTH 3 TIMES A DAY 90 capsule 0  . lisinopril (ZESTRIL) 20 MG tablet Take 1 tablet (20 mg total) by mouth daily. 90 tablet 1  . meloxicam (MOBIC) 15 MG tablet TAKE ONE TABLET BY MOUTH EVERY DAY AS NEEDED FOR PAIN 30 tablet 0  . metoprolol tartrate (LOPRESSOR) 25 MG tablet TAKE ONE TABLET BY MOUTH 2 TIMES A DAY 60 tablet 2  . nitroGLYCERIN (NITROSTAT) 0.4 MG SL tablet Place 1 tablet (0.4 mg total) under the tongue every 5 (five) minutes x 3 doses as needed for chest pain. 25  tablet 1  . pantoprazole (PROTONIX) 40 MG tablet Take 1 tablet (40 mg total) by mouth daily. 30 tablet 2  . potassium chloride (KLOR-CON) 10 MEQ tablet Take 2 tablets (20 mEq total) by mouth daily. (Patient taking differently: Take 40 mEq by mouth daily. ) 180 tablet 3  . rosuvastatin (CRESTOR) 40 MG tablet Take 1 tablet (40 mg total) by mouth daily. 90 tablet 3  . terbinafine (ATHLETES FOOT AF) 1 % cream Apply 1 application topically 2 (two) times daily. 30 g 0   No current facility-administered medications on file prior to visit.     Family Hx: The patient's family history includes CAD in his father. There is no history of Kidney disease or Diabetes Mellitus II.  ROS:   Please see the history of present illness.    Review of Systems  Constitutional: Negative.   HENT: Negative.   Respiratory: Positive for shortness of breath.   Cardiovascular: Positive for leg swelling.  Gastrointestinal: Negative.   Musculoskeletal: Negative.   Neurological: Negative.   Psychiatric/Behavioral: Negative.   All other systems reviewed and are negative.    Labs/Other Tests and Data Reviewed:    Recent Labs: 03/30/2019: B Natriuretic Peptide 10.0 04/14/2019: ALT 29 05/26/2019: BUN 20; Creatinine, Ser 1.21; Hemoglobin 14.0; Platelets 197; Potassium 4.1; Sodium 140   Recent Lipid Panel Lab Results  Component Value Date/Time   CHOL 188 04/14/2019 09:25 AM   CHOL 184 04/06/2019 03:12 PM   TRIG 261 (H) 04/14/2019 09:25 AM   HDL 49 04/14/2019 09:25 AM   HDL 40 04/06/2019 03:12 PM   CHOLHDL 3.8 04/14/2019 09:25 AM   LDLCALC 87 04/14/2019 09:25 AM   LDLCALC 77 04/06/2019 03:12 PM   LDLDIRECT 107 (H) 04/06/2019 03:12 PM    Wt Readings from Last 3 Encounters:  06/07/19 260 lb 8 oz (118.2 kg)  05/31/19 263 lb 9.6 oz (119.6 kg)  05/26/19 264 lb (119.7 kg)     Exam:    Vital Signs: Vital signs may also be detailed in the HPI BP 110/70 (BP Location: Left Arm, Patient Position: Sitting, Cuff  Size: Normal)   Pulse 63   Ht '5\' 8"'  (1.727 m)   Wt 260 lb 8 oz (118.2 kg)   SpO2 95%   BMI 39.61  kg/m   Wt Readings from Last 3 Encounters:  06/07/19 260 lb 8 oz (118.2 kg)  05/31/19 263 lb 9.6 oz (119.6 kg)  05/26/19 264 lb (119.7 kg)   Temp Readings from Last 3 Encounters:  05/26/19 97.9 F (36.6 C) (Oral)  04/01/19 (!) 96.6 F (35.9 C)  03/30/19 98.3 F (36.8 C) (Oral)   BP Readings from Last 3 Encounters:  06/07/19 110/70  05/31/19 (!) 144/98  05/26/19 104/60   Pulse Readings from Last 3 Encounters:  06/07/19 63  05/31/19 82  05/26/19 62    Constitutional:  oriented to person, place, and time. No distress.  HENT:  Head: Grossly normal Eyes:  no discharge. No scleral icterus.  Neck: No JVD, no carotid bruits  Cardiovascular: Regular rate and rhythm, no murmurs appreciated 1+ pitting lower extremity edema Pulmonary/Chest: Clear to auscultation bilaterally, no wheezes or rails Abdominal: Soft.  no distension.  no tenderness.  Musculoskeletal: Normal range of motion Neurological:  normal muscle tone. Coordination normal. 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 (HCC) Long history of coronary disease with stenting going back to 2012 Denies any anginal symptoms, no further ischemic work-up at this time  Essential hypertension Medication changes as detailed below  Mixed hyperlipidemia Continue Zetia with Crestor 40 daily  Smoker Smoking cessation recommended  Chronic diastolic/systolic CHF Still with lower extremity edema He has been taking Lasix 40 twice daily with no improvement Recommend he try torsemide 20 twice daily with potassium 20 daily May need higher dose torsemide Recommended he moderate his fluid intake   Disposition: Follow-up in 3 months   Signed, Ida Rogue, MD  06/07/2019 11:00 AM    [place] [facility] [address], [place], [place] [address]

## 2019-06-07 ENCOUNTER — Encounter: Payer: Self-pay | Admitting: Cardiovascular Disease

## 2019-06-07 ENCOUNTER — Ambulatory Visit (INDEPENDENT_AMBULATORY_CARE_PROVIDER_SITE_OTHER): Payer: Self-pay | Admitting: Cardiovascular Disease

## 2019-06-07 ENCOUNTER — Other Ambulatory Visit: Payer: Self-pay

## 2019-06-07 VITALS — BP 110/70 | HR 63 | Ht 68.0 in | Wt 260.5 lb

## 2019-06-07 DIAGNOSIS — I10 Essential (primary) hypertension: Secondary | ICD-10-CM

## 2019-06-07 DIAGNOSIS — I5022 Chronic systolic (congestive) heart failure: Secondary | ICD-10-CM

## 2019-06-07 DIAGNOSIS — F17200 Nicotine dependence, unspecified, uncomplicated: Secondary | ICD-10-CM

## 2019-06-07 DIAGNOSIS — R0600 Dyspnea, unspecified: Secondary | ICD-10-CM

## 2019-06-07 DIAGNOSIS — E785 Hyperlipidemia, unspecified: Secondary | ICD-10-CM

## 2019-06-07 DIAGNOSIS — I25118 Atherosclerotic heart disease of native coronary artery with other forms of angina pectoris: Secondary | ICD-10-CM

## 2019-06-07 MED ORDER — TORSEMIDE 20 MG PO TABS: 20.0000 mg | ORAL_TABLET | Freq: Two times a day (BID) | ORAL | 1 refills | Status: DC

## 2019-06-07 MED ORDER — POTASSIUM CHLORIDE CRYS ER 10 MEQ PO TBCR: 20.0000 meq | EXTENDED_RELEASE_TABLET | Freq: Two times a day (BID) | ORAL | 1 refills | Status: DC

## 2019-06-07 NOTE — Patient Instructions (Addendum)
Medication Instructions:  Please stop the lasix/furosemide  Start TORSEMIDE 20 mg twice as a day Potassium 20 meq twice a day  Cut back on water intake  If you need a refill on your cardiac medications before your next appointment, please call your pharmacy.    Lab work: No new labs needed   If you have labs (blood work) drawn today and your tests are completely normal, you will receive your results only by: Marland Kitchen MyChart Message (if you have MyChart) OR . A paper copy in the mail If you have any lab test that is abnormal or we need to change your treatment, we will call you to review the results.   Testing/Procedures: No new testing needed   Follow-Up: At [facility], you and your health needs are our priority.  As part of our continuing mission to provide you with exceptional heart care, we have created designated Provider Care Teams.  These Care Teams include your primary Cardiologist (physician) and Advanced Practice Providers (APPs -  Physician Assistants and Nurse Practitioners) who all work together to provide you with the care you need, when you need it.  . You will need a follow up appointment in 3 months  . Providers on your designated Care Team:   . Nicolasa Ducking, NP . Eula Listen, PA-C . Marisue Ivan, PA-C  Any Other Special Instructions Will Be Listed Below (If Applicable).  For educational health videos Log in to : [email or web address] Or : [email or web address], password : [email or web address]

## 2019-06-09 ENCOUNTER — Ambulatory Visit: Payer: Medicaid Other | Admitting: Gerontology

## 2019-06-16 NOTE — Telephone Encounter (Signed)
Attempted to schedule order in for [facility] clinic.    No ans no vm

## 2019-06-17 ENCOUNTER — Encounter: Payer: Self-pay | Admitting: Gerontology

## 2019-06-17 ENCOUNTER — Other Ambulatory Visit: Payer: Self-pay

## 2019-06-17 ENCOUNTER — Ambulatory Visit: Payer: Medicaid Other | Admitting: Gerontology

## 2019-06-17 VITALS — BP 134/82 | HR 63 | Ht 68.0 in | Wt 265.0 lb

## 2019-06-17 DIAGNOSIS — I10 Essential (primary) hypertension: Secondary | ICD-10-CM

## 2019-06-17 DIAGNOSIS — R4589 Other symptoms and signs involving emotional state: Secondary | ICD-10-CM

## 2019-06-17 DIAGNOSIS — Z8719 Personal history of other diseases of the digestive system: Secondary | ICD-10-CM

## 2019-06-17 DIAGNOSIS — E782 Mixed hyperlipidemia: Secondary | ICD-10-CM

## 2019-06-17 DIAGNOSIS — R200 Anesthesia of skin: Secondary | ICD-10-CM

## 2019-06-17 DIAGNOSIS — I5022 Chronic systolic (congestive) heart failure: Secondary | ICD-10-CM

## 2019-06-17 DIAGNOSIS — I213 ST elevation (STEMI) myocardial infarction of unspecified site: Secondary | ICD-10-CM

## 2019-06-17 DIAGNOSIS — R600 Localized edema: Secondary | ICD-10-CM

## 2019-06-17 MED ORDER — PANTOPRAZOLE SODIUM 40 MG PO TBEC: 40.0000 mg | DELAYED_RELEASE_TABLET | Freq: Every day | ORAL | 2 refills | Status: DC

## 2019-06-17 MED ORDER — CLOPIDOGREL BISULFATE 75 MG PO TABS: 75.0000 mg | ORAL_TABLET | Freq: Every day | ORAL | 1 refills | Status: DC

## 2019-06-17 MED ORDER — GABAPENTIN 100 MG PO CAPS: 100.0000 mg | ORAL_CAPSULE | Freq: Three times a day (TID) | ORAL | 0 refills | Status: DC

## 2019-06-17 MED ORDER — METOPROLOL TARTRATE 25 MG PO TABS: ORAL_TABLET | ORAL | 2 refills | Status: DC

## 2019-06-17 NOTE — Patient Instructions (Signed)
Smoking Tobacco Information, Adult Smoking tobacco can be harmful to your health. Tobacco contains a poisonous (toxic), colorless chemical called nicotine. Nicotine is addictive. It changes the brain and can make it hard to stop smoking. Tobacco also has other toxic chemicals that can hurt your body and raise your risk of many cancers. How can smoking tobacco affect me? Smoking tobacco puts you at risk for:  Cancer. Smoking is most commonly associated with lung cancer, but can also lead to cancer in other parts of the body.  Chronic obstructive pulmonary disease (COPD). This is a long-term lung condition that makes it hard to breathe. It also gets worse over time.  High blood pressure (hypertension), heart disease, stroke, or heart attack.  Lung infections, such as pneumonia.  Cataracts. This is when the lenses in the eyes become clouded.  Digestive problems. This may include peptic ulcers, heartburn, and gastroesophageal reflux disease (GERD).  Oral health problems, such as gum disease and tooth loss.  Loss of taste and smell. Smoking can affect your appearance by causing:  Wrinkles.  Yellow or stained teeth, fingers, and fingernails. Smoking tobacco can also affect your social life, because:  It may be challenging to find places to smoke when away from home. Many workplaces, restaurants, hotels, and public places are tobacco-free.  Smoking is expensive. This is due to the cost of tobacco and the long-term costs of treating health problems from smoking.  Secondhand smoke may affect those around you. Secondhand smoke can cause lung cancer, breathing problems, and heart disease. Children of smokers have a higher risk for: ? Sudden infant death syndrome (SIDS). ? Ear infections. ? Lung infections. If you currently smoke tobacco, quitting now can help you:  Lead a longer and healthier life.  Look, smell, breathe, and feel better over time.  Save money.  Protect others from the  harms of secondhand smoke. What actions can I take to prevent health problems? Quit smoking   Do not start smoking. Quit if you already do.  Make a plan to quit smoking and commit to it. Look for programs to help you and ask your health care provider for recommendations and ideas.  Set a date and write down all the reasons you want to quit.  Let your friends and family know you are quitting so they can help and support you. Consider finding friends who also want to quit. It can be easier to quit with someone else, so that you can support each other.  Talk with your health care provider about using nicotine replacement medicines to help you quit, such as gum, lozenges, patches, sprays, or pills.  Do not replace cigarette smoking with electronic cigarettes, which are commonly called e-cigarettes. The safety of e-cigarettes is not known, and some may contain harmful chemicals.  If you try to quit but return to smoking, stay positive. It is common to slip up when you first quit, so take it one day at a time.  Be prepared for cravings. When you feel the urge to smoke, chew gum or suck on hard candy. Lifestyle  Stay busy and take care of your body.  Drink enough fluid to keep your urine pale yellow.  Get plenty of exercise and eat a healthy diet. This can help prevent weight gain after quitting.  Monitor your eating habits. Quitting smoking can cause you to have a larger appetite than when you smoke.  Find ways to relax. Go out with friends or family to a movie or a restaurant   where people do not smoke.  Ask your health care provider about having regular tests (screenings) to check for cancer. This may include blood tests, imaging tests, and other tests.  Find ways to manage your stress, such as meditation, yoga, or exercise. Where to find support To get support to quit smoking, consider:  Asking your health care provider for more information and resources.  Taking classes to learn  more about quitting smoking.  Looking for local organizations that offer resources about quitting smoking.  Joining a support group for people who want to quit smoking in your local community.  Calling the smokefree.gov counselor helpline: 1-800-Quit-Now (1-800-784-8669) Where to find more information You may find more information about quitting smoking from:  HelpGuide.org: www.helpguide.org  Smokefree.gov: smokefree.gov  American Lung Association: www.lung.org Contact a health care provider if you:  Have problems breathing.  Notice that your lips, nose, or fingers turn blue.  Have chest pain.  Are coughing up blood.  Feel faint or you pass out.  Have other health changes that cause you to worry. Summary  Smoking tobacco can negatively affect your health, the health of those around you, your finances, and your social life.  Do not start smoking. Quit if you already do. If you need help quitting, ask your health care provider.  Think about joining a support group for people who want to quit smoking in your local community. There are many effective programs that will help you to quit this behavior. This information is not intended to replace advice given to you by your health care provider. Make sure you discuss any questions you have with your health care provider. Document Revised: 02/12/2019 Document Reviewed: 06/04/2016 Elsevier Patient Education  2020 [employer] Fat and Cholesterol Restricted Eating Plan Getting too much fat and cholesterol in your diet may cause health problems. Choosing the right foods helps keep your fat and cholesterol at normal levels. This can keep you from getting certain diseases. Your doctor may recommend an eating plan that includes:  Total fat: ______% or less of total calories a day.  Saturated fat: ______% or less of total calories a day.  Cholesterol: less than _________mg a day.  Fiber: ______g a day. What are tips for following  this plan? Meal planning  At meals, divide your plate into four equal parts: ? Fill one-half of your plate with vegetables and green salads. ? Fill one-fourth of your plate with whole grains. ? Fill one-fourth of your plate with low-fat (lean) protein foods.  Eat fish that is high in omega-3 fats at least two times a week. This includes mackerel, tuna, sardines, and salmon.  Eat foods that are high in fiber, such as whole grains, beans, apples, broccoli, carrots, peas, and barley. General tips   Work with your doctor to lose weight if you need to.  Avoid: ? Foods with added sugar. ? Fried foods. ? Foods with partially hydrogenated oils.  Limit alcohol intake to no more than 1 drink a day for nonpregnant women and 2 drinks a day for men. One drink equals 12 oz of beer, 5 oz of wine, or 1 oz of hard liquor. Reading food labels  Check food labels for: ? Trans fats. ? Partially hydrogenated oils. ? Saturated fat (g) in each serving. ? Cholesterol (mg) in each serving. ? Fiber (g) in each serving.  Choose foods with healthy fats, such as: ? Monounsaturated fats. ? Polyunsaturated fats. ? Omega-3 fats.  Choose grain products that have whole grains.   Look for the word "whole" as the first word in the ingredient list. Cooking  Cook foods using low-fat methods. These include baking, boiling, grilling, and broiling.  Eat more home-cooked foods. Eat at restaurants and buffets less often.  Avoid cooking using saturated fats, such as butter, cream, palm oil, palm kernel oil, and coconut oil. Recommended foods  Fruits  All fresh, canned (in natural juice), or frozen fruits. Vegetables  Fresh or frozen vegetables (raw, steamed, roasted, or grilled). Green salads. Grains  Whole grains, such as whole wheat or whole grain breads, crackers, cereals, and pasta. Unsweetened oatmeal, bulgur, barley, quinoa, or brown rice. Corn or whole wheat flour tortillas. Meats and other protein  foods  Ground beef (85% or leaner), grass-fed beef, or beef trimmed of fat. Skinless chicken or turkey. Ground chicken or turkey. Pork trimmed of fat. All fish and seafood. Egg whites. Dried beans, peas, or lentils. Unsalted nuts or seeds. Unsalted canned beans. Nut butters without added sugar or oil. Dairy  Low-fat or nonfat dairy products, such as skim or 1% milk, 2% or reduced-fat cheeses, low-fat and fat-free ricotta or cottage cheese, or plain low-fat and nonfat yogurt. Fats and oils  Tub margarine without trans fats. Light or reduced-fat mayonnaise and salad dressings. Avocado. Olive, canola, sesame, or safflower oils. The items listed above may not be a complete list of foods and beverages you can eat. Contact a dietitian for more information. Foods to avoid Fruits  Canned fruit in heavy syrup. Fruit in cream or butter sauce. Fried fruit. Vegetables  Vegetables cooked in cheese, cream, or butter sauce. Fried vegetables. Grains  White bread. White pasta. White rice. Cornbread. Bagels, pastries, and croissants. Crackers and snack foods that contain trans fat and hydrogenated oils. Meats and other protein foods  Fatty cuts of meat. Ribs, chicken wings, bacon, sausage, bologna, salami, chitterlings, fatback, hot dogs, bratwurst, and packaged lunch meats. Liver and organ meats. Whole eggs and egg yolks. Chicken and turkey with skin. Fried meat. Dairy  Whole or 2% milk, cream, half-and-half, and cream cheese. Whole milk cheeses. Whole-fat or sweetened yogurt. Full-fat cheeses. Nondairy creamers and whipped toppings. Processed cheese, cheese spreads, and cheese curds. Beverages  Alcohol. Sugar-sweetened drinks such as sodas, lemonade, and fruit drinks. Fats and oils  Butter, stick margarine, lard, shortening, ghee, or bacon fat. Coconut, palm kernel, and palm oils. Sweets and desserts  Corn syrup, sugars, honey, and molasses. Candy. Jam and jelly. Syrup. Sweetened cereals. Cookies,  pies, cakes, donuts, muffins, and ice cream. The items listed above may not be a complete list of foods and beverages you should avoid. Contact a dietitian for more information. Summary  Choosing the right foods helps keep your fat and cholesterol at normal levels. This can keep you from getting certain diseases.  At meals, fill one-half of your plate with vegetables and green salads.  Eat high-fiber foods, like whole grains, beans, apples, carrots, peas, and barley.  Limit added sugar, saturated fats, alcohol, and fried foods. This information is not intended to replace advice given to you by your health care provider. Make sure you discuss any questions you have with your health care provider. Document Revised: 01/21/2018 Document Reviewed: 02/04/2017 Elsevier Patient Education  2020 [employer] DASH Eating Plan DASH stands for "Dietary Approaches to Stop Hypertension." The DASH eating plan is a healthy eating plan that has been shown to reduce high blood pressure (hypertension). It may also reduce your risk for type 2 diabetes, heart disease, and stroke. The   DASH eating plan may also help with weight loss. What are tips for following this plan?  General guidelines  Avoid eating more than 2,300 mg (milligrams) of salt (sodium) a day. If you have hypertension, you may need to reduce your sodium intake to 1,500 mg a day.  Limit alcohol intake to no more than 1 drink a day for nonpregnant women and 2 drinks a day for men. One drink equals 12 oz of beer, 5 oz of wine, or 1 oz of hard liquor.  Work with your health care provider to maintain a healthy body weight or to lose weight. Ask what an ideal weight is for you.  Get at least 30 minutes of exercise that causes your heart to beat faster (aerobic exercise) most days of the week. Activities may include walking, swimming, or biking.  Work with your health care provider or diet and nutrition specialist (dietitian) to adjust your eating  plan to your individual calorie needs. Reading food labels   Check food labels for the amount of sodium per serving. Choose foods with less than 5 percent of the Daily Value of sodium. Generally, foods with less than 300 mg of sodium per serving fit into this eating plan.  To find whole grains, look for the word "whole" as the first word in the ingredient list. Shopping  Buy products labeled as "low-sodium" or "no salt added."  Buy fresh foods. Avoid canned foods and premade or frozen meals. Cooking  Avoid adding salt when cooking. Use salt-free seasonings or herbs instead of table salt or sea salt. Check with your health care provider or pharmacist before using salt substitutes.  Do not fry foods. Cook foods using healthy methods such as baking, boiling, grilling, and broiling instead.  Cook with heart-healthy oils, such as olive, canola, soybean, or sunflower oil. Meal planning  Eat a balanced diet that includes: ? 5 or more servings of fruits and vegetables each day. At each meal, try to fill half of your plate with fruits and vegetables. ? Up to 6-8 servings of whole grains each day. ? Less than 6 oz of lean meat, poultry, or fish each day. A 3-oz serving of meat is about the same size as a deck of cards. One egg equals 1 oz. ? 2 servings of low-fat dairy each day. ? A serving of nuts, seeds, or beans 5 times each week. ? Heart-healthy fats. Healthy fats called Omega-3 fatty acids are found in foods such as flaxseeds and coldwater fish, like sardines, salmon, and mackerel.  Limit how much you eat of the following: ? Canned or prepackaged foods. ? Food that is high in trans fat, such as fried foods. ? Food that is high in saturated fat, such as fatty meat. ? Sweets, desserts, sugary drinks, and other foods with added sugar. ? Full-fat dairy products.  Do not salt foods before eating.  Try to eat at least 2 vegetarian meals each week.  Eat more home-cooked food and less  restaurant, buffet, and fast food.  When eating at a restaurant, ask that your food be prepared with less salt or no salt, if possible. What foods are recommended? The items listed may not be a complete list. Talk with your dietitian about what dietary choices are best for you. Grains Whole-grain or whole-wheat bread. Whole-grain or whole-wheat pasta. Brown rice. Oatmeal. Quinoa. Bulgur. Whole-grain and low-sodium cereals. Pita bread. Low-fat, low-sodium crackers. Whole-wheat flour tortillas. Vegetables Fresh or frozen vegetables (raw, steamed, roasted, or grilled). Low-sodium   or reduced-sodium tomato and vegetable juice. Low-sodium or reduced-sodium tomato sauce and tomato paste. Low-sodium or reduced-sodium canned vegetables. Fruits All fresh, dried, or frozen fruit. Canned fruit in natural juice (without added sugar). Meat and other protein foods Skinless chicken or turkey. Ground chicken or turkey. Pork with fat trimmed off. Fish and seafood. Egg whites. Dried beans, peas, or lentils. Unsalted nuts, nut butters, and seeds. Unsalted canned beans. Lean cuts of beef with fat trimmed off. Low-sodium, lean deli meat. Dairy Low-fat (1%) or fat-free (skim) milk. Fat-free, low-fat, or reduced-fat cheeses. Nonfat, low-sodium ricotta or cottage cheese. Low-fat or nonfat yogurt. Low-fat, low-sodium cheese. Fats and oils Soft margarine without trans fats. Vegetable oil. Low-fat, reduced-fat, or light mayonnaise and salad dressings (reduced-sodium). Canola, safflower, olive, soybean, and sunflower oils. Avocado. Seasoning and other foods Herbs. Spices. Seasoning mixes without salt. Unsalted popcorn and pretzels. Fat-free sweets. What foods are not recommended? The items listed may not be a complete list. Talk with your dietitian about what dietary choices are best for you. Grains Baked goods made with fat, such as croissants, muffins, or some breads. Dry pasta or rice meal packs. Vegetables Creamed or  fried vegetables. Vegetables in a cheese sauce. Regular canned vegetables (not low-sodium or reduced-sodium). Regular canned tomato sauce and paste (not low-sodium or reduced-sodium). Regular tomato and vegetable juice (not low-sodium or reduced-sodium). Pickles. Olives. Fruits Canned fruit in a light or heavy syrup. Fried fruit. Fruit in cream or butter sauce. Meat and other protein foods Fatty cuts of meat. Ribs. Fried meat. Bacon. Sausage. Bologna and other processed lunch meats. Salami. Fatback. Hotdogs. Bratwurst. Salted nuts and seeds. Canned beans with added salt. Canned or smoked fish. Whole eggs or egg yolks. Chicken or turkey with skin. Dairy Whole or 2% milk, cream, and half-and-half. Whole or full-fat cream cheese. Whole-fat or sweetened yogurt. Full-fat cheese. Nondairy creamers. Whipped toppings. Processed cheese and cheese spreads. Fats and oils Butter. Stick margarine. Lard. Shortening. Ghee. Bacon fat. Tropical oils, such as coconut, palm kernel, or palm oil. Seasoning and other foods Salted popcorn and pretzels. Onion salt, garlic salt, seasoned salt, table salt, and sea salt. Worcestershire sauce. Tartar sauce. Barbecue sauce. Teriyaki sauce. Soy sauce, including reduced-sodium. Steak sauce. Canned and packaged gravies. Fish sauce. Oyster sauce. Cocktail sauce. Horseradish that you find on the shelf. Ketchup. Mustard. Meat flavorings and tenderizers. Bouillon cubes. Hot sauce and Tabasco sauce. Premade or packaged marinades. Premade or packaged taco seasonings. Relishes. Regular salad dressings. Where to find more information:  National Heart, Lung, and Blood Institute: www.nhlbi.nih.gov  American Heart Association: www.heart.org Summary  The DASH eating plan is a healthy eating plan that has been shown to reduce high blood pressure (hypertension). It may also reduce your risk for type 2 diabetes, heart disease, and stroke.  With the DASH eating plan, you should limit salt  (sodium) intake to 2,300 mg a day. If you have hypertension, you may need to reduce your sodium intake to 1,500 mg a day.  When on the DASH eating plan, aim to eat more fresh fruits and vegetables, whole grains, lean proteins, low-fat dairy, and heart-healthy fats.  Work with your health care provider or diet and nutrition specialist (dietitian) to adjust your eating plan to your individual calorie needs. This information is not intended to replace advice given to you by your health care provider. Make sure you discuss any questions you have with your health care provider. Document Revised: 05/02/2017 Document Reviewed: 05/13/2016 Elsevier Patient Education  2020 [employer]  

## 2019-06-17 NOTE — Progress Notes (Signed)
Established Patient Office Visit  Subjective:  Patient ID: Bryce [name], male    DOB: 1966/02/20  Age: 54 y.o. MRN: [id number]  CC:  Chief Complaint  Patient presents with  . Hypertension    HPI Bryce [name] presents for follow up of hypertension, Gerd, smoking, tenia pedis of both feet and medication refill.  He states that he is compliant with his medications and check his blood pressure at home, and adheres to DASH diet.  He states that his acid reflux is controlled with taking Protonix 40 mg daily, and and he smokes 1 pack of cigarettes weekly, and admits to desire to quit.  He was seen by the cardiologist Dr. Rockey Situ on 06/07/2019, he was prescribed 20 mg torsemide twice daily and potassium 20 mEq daily.  He was also seen at the heart failure clinic by Ms. Jackelyn Hoehn, T FNP on 05/31/2019.  He reports that he checks his weight every morning and know when to notify the clinic. He states that his lower legs are swollen right > left, but denies erythema, and claudication. He states that he continues to experience nocturnal leg cramps and he was advised by Dr. Rockey Situ to take magnesium 400 mg as needed which moderately relieves his symptoms.  He also was seen at the ED on 05/26/2019 for possible athlete's foot and tinea, and he states that his symptoms has improved with using Terbinafine.  He states that his mood fluctuates and he is having problem with his spouse, he denies suicidal or homicidal ideation and request for mental health evaluation.  He denies chest pain, palpitation, lightheadedness, fever, chills, bruises, hematuria, hematochezia, but admits to mild shortness of breath with exertion and mild fatigue.  Overall he states that he is doing well and offers no further complaint.   Past Medical History:  Diagnosis Date  . Chronic systolic congestive heart failure ([place])   . Coronary artery disease   . GERD (gastroesophageal reflux disease)   . Hypertension   . Ischemic cardiomyopathy    . MI (myocardial infarction) ([place])   . Migraine     Past Surgical History:  Procedure Laterality Date  . CARDIAC CATHETERIZATION N/A 12/28/2014   Procedure: Left Heart Cath and Coronary Angiography;  Surgeon: Yolonda Kida, MD;  Location: [facility];  Service: Cardiovascular;  Laterality: N/A;  . CORONARY ANGIOPLASTY     Non-ST elevation MI status post stenting within bare-metal stent of the 75% lesion of the LAD, 11/13/2010.   . CORONARY STENT INTERVENTION N/A 02/02/2017   Procedure: CORONARY/GRAFT ACUTE MI REVASCULARIZATION;  Surgeon: Yolonda Kida, MD;  Location: [facility];  Service: Cardiovascular;  Laterality: N/A;  . LEFT HEART CATH AND CORONARY ANGIOGRAPHY N/A 02/02/2017   Procedure: LEFT HEART CATH AND CORONARY ANGIOGRAPHY;  Surgeon: Yolonda Kida, MD;  Location: [facility];  Service: Cardiovascular;  Laterality: N/A;    Family History  Problem Relation Age of Onset  . CAD Father   . Kidney disease Neg Hx   . Diabetes Mellitus II Neg Hx     Social History   Socioeconomic History  . Marital status: Single    Spouse name: Not on file  . Number of children: Not on file  . Years of education: Not on file  . Highest education level: Not on file  Occupational History  . Occupation: Proofreader  Tobacco Use  . Smoking status: Current Some Day Smoker    Packs/day: 1.00  Years: 5.00    Pack years: 5.00    Types: Cigarettes    Last attempt to quit: 01/05/2017    Years since quitting: 2.4  . Smokeless tobacco: Never Used  . Tobacco comment: states smokes occasionally 10-12-18  Substance and Sexual Activity  . Alcohol use: Not Currently  . Drug use: Yes    Types: Marijuana  . Sexual activity: Yes  Other Topics Concern  . Not on file  Social History Narrative  . Not on file   Social Determinants of Health   Financial Resource Strain: Low Risk   . Difficulty of Paying Living Expenses: Not hard at all  Food Insecurity: No  Food Insecurity  . Worried About Charity fundraiser in the Last Year: Never true  . Ran Out of Food in the Last Year: Never true  Transportation Needs: No Transportation Needs  . Lack of Transportation (Medical): No  . Lack of Transportation (Non-Medical): No  Physical Activity: Sufficiently Active  . Days of Exercise per Week: 7 days  . Minutes of Exercise per Session: 30 min  Stress: No Stress Concern Present  . Feeling of Stress : Not at all  Social Connections: Somewhat Isolated  . Frequency of Communication with Friends and Family: More than three times a week  . Frequency of Social Gatherings with Friends and Family: More than three times a week  . Attends Religious Services: Never  . Active Member of Clubs or Organizations: No  . Attends Archivist Meetings: Never  . Marital Status: Married  Human resources officer Violence: Not At Risk  . Fear of Current or Ex-Partner: No  . Emotionally Abused: No  . Physically Abused: No  . Sexually Abused: No    Outpatient Medications Prior to Visit  Medication Sig Dispense Refill  . aspirin EC 81 MG EC tablet Take 1 tablet (81 mg total) by mouth daily. 30 tablet 0  . ezetimibe (ZETIA) 10 MG tablet Take 1 tablet (10 mg total) by mouth daily. 90 tablet 3  . lisinopril (ZESTRIL) 20 MG tablet Take 1 tablet (20 mg total) by mouth daily. 90 tablet 1  . meloxicam (MOBIC) 15 MG tablet TAKE ONE TABLET BY MOUTH EVERY DAY AS NEEDED FOR PAIN 30 tablet 0  . nitroGLYCERIN (NITROSTAT) 0.4 MG SL tablet Place 1 tablet (0.4 mg total) under the tongue every 5 (five) minutes x 3 doses as needed for chest pain. 25 tablet 1  . potassium chloride (KLOR-CON) 10 MEQ tablet Take 2 tablets (20 mEq total) by mouth 2 (two) times daily. 360 tablet 1  . rosuvastatin (CRESTOR) 40 MG tablet Take 1 tablet (40 mg total) by mouth daily. 90 tablet 3  . torsemide (DEMADEX) 20 MG tablet Take 1 tablet (20 mg total) by mouth 2 (two) times daily. 180 tablet 1  . clopidogrel  (PLAVIX) 75 MG tablet TAKE ONE TABLET BY MOUTH EVERY DAY 90 tablet 0  . gabapentin (NEURONTIN) 100 MG capsule TAKE ONE CAPSULE BY MOUTH 3 TIMES A DAY 90 capsule 0  . metoprolol tartrate (LOPRESSOR) 25 MG tablet TAKE ONE TABLET BY MOUTH 2 TIMES A DAY 60 tablet 2  . pantoprazole (PROTONIX) 40 MG tablet Take 1 tablet (40 mg total) by mouth daily. 30 tablet 2  . aspirin-acetaminophen-caffeine (EXCEDRIN MIGRAINE) 250-250-65 MG tablet Take 1 tablet by mouth daily as needed for headache. 30 tablet 0  . Blood Pressure KIT 1 kit by Does not apply route daily. 1 kit 0  . Docosanol (  ABREVA) 10 % CREA Apply 1 application topically 2 (two) times daily. (Patient taking differently: Apply 1 application topically 2 (two) times daily as needed. ) 1 Tube 0  . terbinafine (ATHLETES FOOT AF) 1 % cream Apply 1 application topically 2 (two) times daily. 30 g 0  . furosemide (LASIX) 20 MG tablet Take 2 tablets (40 mg total) by mouth daily. (Patient taking differently: Take 40 mg by mouth 2 (two) times daily. ) 180 tablet 3   No facility-administered medications prior to visit.    Allergies  Allergen Reactions  . Atorvastatin     Myalgias    ROS Review of Systems  Constitutional: Positive for fatigue.  Eyes: Negative.   Respiratory: Positive for shortness of breath (mild shortness of breath).   Cardiovascular: Positive for leg swelling.  Gastrointestinal: Negative.   Skin: Negative.   Neurological: Negative.   Hematological: Negative.   Psychiatric/Behavioral: Positive for dysphoric mood.      Objective:    Physical Exam  Constitutional: He is oriented to person, place, and time. He appears well-developed.  HENT:  Head: Normocephalic and atraumatic.  Eyes: Pupils are equal, round, and reactive to light. EOM are normal.  Cardiovascular: Normal rate and regular rhythm.  Pulmonary/Chest: Effort normal and breath sounds normal.  Musculoskeletal:        General: Edema (+1 to right leg and trace to left  leg) present.  Neurological: He is alert and oriented to person, place, and time.  Skin: Skin is warm and dry.  Psychiatric: He has a normal mood and affect. His behavior is normal. Judgment and thought content normal.    BP 134/82 (BP Location: Left Arm, Patient Position: Sitting)   Pulse 63   Ht _0  (1.727 m)   Wt 265 lb (120.2 kg)   SpO2 97%   BMI 40.29 kg/m  Wt Readings from Last 3 Encounters:  06/17/19 265 lb (120.2 kg)  06/07/19 260 lb 8 oz (118.2 kg)  05/31/19 263 lb 9.6 oz (119.6 kg)   He gained 5 pounds in 10 days and was advised to monitor weight and notify heart failure clinic.  Health Maintenance Due  Topic Date Due  . TETANUS/TDAP  10/11/1983  . COLONOSCOPY  10/11/2014    There are no preventive care reminders to display for this patient.  Lab Results  Component Value Date   TSH 1.624 12/22/2014   Lab Results  Component Value Date   WBC 6.4 05/26/2019   HGB 14.0 05/26/2019   HCT 40.2 05/26/2019   MCV 91.0 05/26/2019   PLT 197 05/26/2019   Lab Results  Component Value Date   NA 140 05/26/2019   K 4.1 05/26/2019   CO2 26 05/26/2019   GLUCOSE 113 (H) 05/26/2019   BUN 20 05/26/2019   CREATININE 1.21 05/26/2019   BILITOT 0.8 04/14/2019   ALKPHOS 54 04/14/2019   AST 18 04/14/2019   ALT 29 04/14/2019   PROT 7.2 04/14/2019   ALBUMIN 4.1 04/14/2019   CALCIUM 9.3 05/26/2019   ANIONGAP 10 05/26/2019   Lab Results  Component Value Date   CHOL 188 04/14/2019   Lab Results  Component Value Date   HDL 49 04/14/2019   Lab Results  Component Value Date   LDLCALC 87 04/14/2019   Lab Results  Component Value Date   TRIG 261 (H) 04/14/2019   Lab Results  Component Value Date   CHOLHDL 3.8 04/14/2019   Lab Results  Component Value Date   HGBA1C 5.8 (  H) 01/27/2019      Assessment & Plan:   1. ST elevation myocardial infarction (STEMI), unspecified artery ([place]) -He will continue on current treatment regimen. - clopidogrel (PLAVIX) 75 MG  tablet; Take 1 tablet (75 mg total) by mouth daily.  Dispense: 90 tablet; Refill: 1  2. Numbness -Numbness is under control and he will continue on current treatment regimen. - gabapentin (NEURONTIN) 100 MG capsule; Take 1 capsule (100 mg total) by mouth 3 (three) times daily.  Dispense: 90 capsule; Refill: 0  3. Essential hypertension -His blood pressure is under control-he will continue on current treatment regimen, he was advised to continue on DASH diet and exercise as tolerated. - metoprolol tartrate (LOPRESSOR) 25 MG tablet; TAKE ONE TABLET BY MOUTH 2 TIMES A DAY  Dispense: 60 tablet; Refill: 2  4. H/O gastroesophageal reflux (GERD) -His acid reflux is under control and he will continue on current treatment regimen. - pantoprazole (PROTONIX) 40 MG tablet; Take 1 tablet (40 mg total) by mouth daily.  Dispense: 30 tablet; Refill: 2   5. Bilateral lower extremity edema -He was encouraged to wear compression stockings, and elevate legs while sitting down.  6. Chronic systolic congestive heart failure ([place]) -He will continue on 20 mg torsemide twice daily and was advised to weigh himself every morning and notify heart failure clinic as needed.  7. Dysphoric mood -He will follow-up with Ms. Julian Hy for mental health evaluation.  He was advised to notify clinic or call the crisis helpline for worsening symptoms.    Follow-up: Return in about 14 weeks (around 09/23/2019), or if symptoms worsen or fail to improve.   Note:  This document was prepared using Dragon voice recognition software and may include unintentional dictation errors.  Jeison Delpilar Jerold Coombe, NP

## 2019-06-23 NOTE — Progress Notes (Signed)
Patient ID: Bryce [name], male    DOB: September 01, 1965, 54 y.o.   MRN: [id number]  HPI  Mr Lady is a 54 y/o male with a history of CAD (MI), HTN, GERD, tobacco use and chronic heart failure.   Echo report from 04/26/2019 reviewed and showed an EF of 45-50%. Echo report from 02/03/17 reviewed and showed an EF of 45-50% along with mild inferior hypokinesis.   Catheterization done 02/02/17 which showed:  Mid LAD lesion, 0 %stenosed.  Mid RCA lesion, 40 %stenosed.  A STENT XIENCE ALPINE RX 3.0X23 drug eluting stent was successfully placed, and does not overlap previously placed stent.  Dist Cx lesion, 100 %stenosed.  Post intervention, there is a 0% residual stenosis.  There is mild left ventricular systolic dysfunction.  LV end diastolic pressure is mildly elevated.  The left ventricular ejection fraction is 50-55% by visual estimate. Conclusion Successful PCI and stent to mid to distal circumflex with DES stent 3.023 mm xience Reducing the lesion from 100% down to 0 point TIMI-3 from 0 back to 3.  Was in the ED 05/26/2019 due to tinea pedis of both feet. Evaluated and released. Was in the ED 03/30/2019 due to leg edema. CXR negative for pulmonary edema. Ultrasound negative for DVT. Released with oral diuretic. Was in the ED 03/17/2019 due to cellulitis of right arm where he was treated and released. Was in the ED 03/11/2019 due to elbow pain where he was treated and released. Was in the ED 03/07/2019 due to ankle pain after a mechanical fall. Diagnosed with nondisplaced avulsion fracture of the right talus and released with podiatry follow-up.   He presents today for a follow-up visit with a chief complaint of moderate fatigue upon minimal exertion. He describes this as chronic in nature having been present for several years. He has associated shortness of breath, pedal edema and depression along with this. He denies any difficulty sleeping, dizziness, abdominal distention, palpitations,  chest pain, cough or weight gain.   Says that he doesn't feel good today but says that he's under stress at home. His significant other's son and DIL moved in with them and the DIL drinks quite a bit and gets "mouthy". Patient says that he tends to stay in his man cave.   Continues to have quite a bit of leg cramping without any relief from taking magnesium.    Past Medical History:  Diagnosis Date  . Chronic systolic congestive heart failure ([place])   . Coronary artery disease   . GERD (gastroesophageal reflux disease)   . Hypertension   . Ischemic cardiomyopathy   . MI (myocardial infarction) ([place])   . Migraine    Past Surgical History:  Procedure Laterality Date  . CARDIAC CATHETERIZATION N/A 12/28/2014   Procedure: Left Heart Cath and Coronary Angiography;  Surgeon: Yolonda Kida, MD;  Location: [facility];  Service: Cardiovascular;  Laterality: N/A;  . CORONARY ANGIOPLASTY     Non-ST elevation MI status post stenting within bare-metal stent of the 75% lesion of the LAD, 11/13/2010.   . CORONARY STENT INTERVENTION N/A 02/02/2017   Procedure: CORONARY/GRAFT ACUTE MI REVASCULARIZATION;  Surgeon: Yolonda Kida, MD;  Location: [facility];  Service: Cardiovascular;  Laterality: N/A;  . LEFT HEART CATH AND CORONARY ANGIOGRAPHY N/A 02/02/2017   Procedure: LEFT HEART CATH AND CORONARY ANGIOGRAPHY;  Surgeon: Yolonda Kida, MD;  Location: [facility];  Service: Cardiovascular;  Laterality: N/A;   Family History  Problem  Relation Age of Onset  . CAD Father   . Kidney disease Neg Hx   . Diabetes Mellitus II Neg Hx    Social History   Tobacco Use  . Smoking status: Current Some Day Smoker    Packs/day: 1.00    Years: 5.00    Pack years: 5.00    Types: Cigarettes    Last attempt to quit: 01/05/2017    Years since quitting: 2.4  . Smokeless tobacco: Never Used  . Tobacco comment: states smokes occasionally 10-12-18  Substance Use Topics  . Alcohol  use: Not Currently   Allergies  Allergen Reactions  . Atorvastatin     Myalgias   Prior to Admission medications   Medication Sig Start Date End Date Taking? Authorizing Provider  aspirin EC 81 MG EC tablet Take 1 tablet (81 mg total) by mouth daily. 02/05/17  Yes Epifanio Lesches, MD  aspirin-acetaminophen-caffeine (EXCEDRIN MIGRAINE) [phone] MG tablet Take 1 tablet by mouth daily as needed for headache. 01/06/19  Yes Iloabachie, Chioma E, NP  Blood Pressure KIT 1 kit by Does not apply route daily. 04/01/19  Yes Iloabachie, Chioma E, NP  clopidogrel (PLAVIX) 75 MG tablet Take 1 tablet (75 mg total) by mouth daily. 06/17/19  Yes Iloabachie, Chioma E, NP  Docosanol (ABREVA) 10 % CREA Apply 1 application topically 2 (two) times daily. Patient taking differently: Apply 1 application topically 2 (two) times daily as needed.  01/15/18  Yes Dustin Flock, MD  ezetimibe (ZETIA) 10 MG tablet Take 1 tablet (10 mg total) by mouth daily. 11/03/18 06/06/28 Yes Gollan, Kathlene November, MD  gabapentin (NEURONTIN) 100 MG capsule Take 1 capsule (100 mg total) by mouth 3 (three) times daily. 06/17/19  Yes Iloabachie, Chioma E, NP  lisinopril (ZESTRIL) 20 MG tablet Take 1 tablet (20 mg total) by mouth daily. 04/01/19  Yes Iloabachie, Chioma E, NP  meloxicam (MOBIC) 15 MG tablet TAKE ONE TABLET BY MOUTH EVERY DAY AS NEEDED FOR PAIN 05/04/19  Yes Iloabachie, Chioma E, NP  metoprolol tartrate (LOPRESSOR) 25 MG tablet TAKE ONE TABLET BY MOUTH 2 TIMES A DAY 06/17/19  Yes Iloabachie, Chioma E, NP  nitroGLYCERIN (NITROSTAT) 0.4 MG SL tablet Place 1 tablet (0.4 mg total) under the tongue every 5 (five) minutes x 3 doses as needed for chest pain. 04/06/19  Yes Dunn, Areta Haber, PA-C  pantoprazole (PROTONIX) 40 MG tablet Take 1 tablet (40 mg total) by mouth daily. 06/17/19  Yes Iloabachie, Chioma E, NP  potassium chloride (KLOR-CON) 10 MEQ tablet Take 2 tablets (20 mEq total) by mouth 2 (two) times daily. 06/07/19  Yes Minna Merritts,  MD  rosuvastatin (CRESTOR) 40 MG tablet Take 1 tablet (40 mg total) by mouth daily. 04/07/19 07/06/19 Yes Dunn, Areta Haber, PA-C  terbinafine (ATHLETES FOOT AF) 1 % cream Apply 1 application topically 2 (two) times daily. 05/26/19  Yes Merlyn Lot, MD  torsemide (DEMADEX) 20 MG tablet Take 1 tablet (20 mg total) by mouth 2 (two) times daily. 06/07/19 09/05/19 Yes Minna Merritts, MD      Review of Systems  Constitutional: Positive for fatigue (tire easily). Negative for appetite change.  HENT: Negative for congestion, postnasal drip and sore throat.   Eyes: Negative.   Respiratory: Positive for shortness of breath. Negative for cough.   Cardiovascular: Positive for leg swelling. Negative for chest pain and palpitations.  Gastrointestinal: Negative for abdominal distention and abdominal pain.  Endocrine: Negative.   Genitourinary: Negative.   Musculoskeletal: Negative for  back pain and neck pain.  Skin: Negative.   Allergic/Immunologic: Negative.   Neurological: Negative for dizziness and light-headedness.  Hematological: Negative for adenopathy. Does not bruise/bleed easily.  Psychiatric/Behavioral: Positive for dysphoric mood. Negative for sleep disturbance (sleeping on 2 pillows).   Vitals:   06/25/19 1028  BP: 129/73  Pulse: 62  Resp: 18  SpO2: 100%  Weight: 260 lb 3.2 oz (118 kg)  Height: '5\' 8"'  (1.727 m)   Wt Readings from Last 3 Encounters:  06/25/19 260 lb 3.2 oz (118 kg)  06/17/19 265 lb (120.2 kg)  06/07/19 260 lb 8 oz (118.2 kg)   Lab Results  Component Value Date   CREATININE 1.21 05/26/2019   CREATININE 0.89 04/14/2019   CREATININE 1.13 04/06/2019     Physical Exam Vitals and nursing note reviewed.  Constitutional:      Appearance: Normal appearance.  HENT:     Head: Normocephalic and atraumatic.  Cardiovascular:     Rate and Rhythm: Normal rate and regular rhythm.  Pulmonary:     Effort: Pulmonary effort is normal. No respiratory distress.     Breath  sounds: No wheezing or rales.  Abdominal:     General: There is no distension.     Palpations: Abdomen is soft.  Musculoskeletal:        General: No tenderness.     Cervical back: Normal range of motion and neck supple.     Right lower leg: No tenderness. Edema (1+ pitting) present.     Left lower leg: No tenderness. Edema (1+ pitting) present.  Skin:    General: Skin is warm and dry.  Neurological:     General: No focal deficit present.     Mental Status: He is alert and oriented to person, place, and time.  Psychiatric:        Mood and Affect: Mood normal.        Behavior: Behavior normal.        Thought Content: Thought content normal.      Assessment & Plan:  1: Chronic heart failure with mildly reduced ejection fraction- - NYHA class III - euvolemic today - weighing daily and he was reminded to call for an overnight weight gain of >2 pounds or a weekly weight gain of >5 pounds - weight down 3 pounds from last visit here 1 month ago - adds salt "to everything" as he says that he gets "bad cramps"; encouraged him to not add salt to his food and to read food labels for sodium content - discussed taking 52m torsemide daily and taking an additional 242min the afternoon for above weight gain, swelling or worsening shortness of breath instead of taking it BID every day; patient says that he will try that - saw cardiology (GRockey Situ1/4/21 - BNP 03/30/2019 was 10.0 - elevating his legs when sitting for long periods of time - has not gotten compression socks yet and he was instructed to get some and put them on every morning with removal at bedtime  2: HTN- - BP looks good today - saw PCP (IHattie Perch1/14/21 - BMP 05/26/2019 reviewed and showed sodium 140, potassium 4.1, creatinine 1.21 and GFR >60  3: Tobacco use- - smokes "some" - complete cessation discussed for 3 minutes   Patient did not bring his medications nor a list. Each medication was verbally reviewed with the  patient and he was encouraged to bring the bottles to every visit to confirm accuracy of list.  Return in 6  months or sooner for any questions/problems before then.

## 2019-06-24 ENCOUNTER — Ambulatory Visit: Payer: Medicaid Other | Admitting: Licensed Clinical Social Worker

## 2019-06-24 ENCOUNTER — Other Ambulatory Visit: Payer: Self-pay

## 2019-06-24 DIAGNOSIS — F341 Dysthymic disorder: Secondary | ICD-10-CM

## 2019-06-24 DIAGNOSIS — F411 Generalized anxiety disorder: Secondary | ICD-10-CM

## 2019-06-24 NOTE — BH Specialist Note (Signed)
Integrated Behavioral Health Comprehensive Clinical Assessment Via Phone  MRN: [id number] Name: Bryce [name]  Type of Service: Integrated Behavioral Health-Individual Interpretor: No. Interpretor Name and Language:   PRESENTING CONCERNS: Bryce [name] is a 54 y.o. male accompanied by himself.[name] was referred to [facility] clinician for mental health.  Previous mental health services Have you ever been treated for a mental health problem? Yes If "Yes", when were you treated and whom did you see? Per the patient, he was previously prescribed Prozac 80 mg a day and Diazepam 5 mg as needed for anxiety. He reports that was previously prescribed by a former primary care provider. He explains that he weaned himself off of it in 2010 to 2012 because he did not think he needed to be on medication anymore.  Have you ever been hospitalized for mental health treatment? No Have you ever been treated for any of the following? Past Psychiatric History/Hospitalization(s): Anxiety: Yes Arye reports that he has been experiencing symptoms of anxiety on and off for the last several years. He explains that his anxiety has worsened in the last few weeks due to his girlfriend of 18 years stating that she would like to just be friends. His symptoms include feeling anxious, nervous, and on edge nearly every day, inability to stop or control his worrying, worrying too much about different things, difficulty relaxing, restlessness, and feeling afraid as if something awful might happen.  Bipolar Disorder: Negative Depression: Yes Librado describes experiencing depression on and off from the last several years. His depression has worsened due to not having any income, issues with girlfriend of 18 years, and not being able to work due to his heart issues. His symptoms include feeling down and depressed nearly every day, loss of interest in previously enjoyed activities, poor appetite,  feeling bad about himself, difficulty concentrating, and restlessness. He denies suicidal and homicidal thoughts.  Mania: Negative Psychosis: Negative Schizophrenia: Negative Personality Disorder: Negative Hospitalization for psychiatric illness: Negative History of Electroconvulsive Shock Therapy: Negative Prior Suicide Attempts: Negative Have you ever had thoughts of harming yourself or others or attempted suicide? No plan to harm self or others  Medical history  has a past medical history of Chronic systolic congestive heart failure ([place]), Coronary artery disease, GERD (gastroesophageal reflux disease), Hypertension, Ischemic cardiomyopathy, MI (myocardial infarction) ([place]), and Migraine. Primary Care Physician: Langston Reusing, NP Date of last physical exam:  Allergies:  Allergies  Allergen Reactions  . Atorvastatin     Myalgias   Current medications:  Outpatient Encounter Medications as of 06/24/2019  Medication Sig  . aspirin EC 81 MG EC tablet Take 1 tablet (81 mg total) by mouth daily.  Marland Kitchen aspirin-acetaminophen-caffeine (EXCEDRIN MIGRAINE) 250-250-65 MG tablet Take 1 tablet by mouth daily as needed for headache.  . Blood Pressure KIT 1 kit by Does not apply route daily.  . clopidogrel (PLAVIX) 75 MG tablet Take 1 tablet (75 mg total) by mouth daily.  . Docosanol (ABREVA) 10 % CREA Apply 1 application topically 2 (two) times daily. (Patient taking differently: Apply 1 application topically 2 (two) times daily as needed. )  . ezetimibe (ZETIA) 10 MG tablet Take 1 tablet (10 mg total) by mouth daily.  Marland Kitchen gabapentin (NEURONTIN) 100 MG capsule Take 1 capsule (100 mg total) by mouth 3 (three) times daily.  Marland Kitchen lisinopril (ZESTRIL) 20 MG tablet Take 1 tablet (20 mg total) by mouth daily.  . meloxicam (MOBIC) 15 MG tablet TAKE ONE TABLET  BY MOUTH EVERY DAY AS NEEDED FOR PAIN  . metoprolol tartrate (LOPRESSOR) 25 MG tablet TAKE ONE TABLET BY MOUTH 2 TIMES A DAY  . nitroGLYCERIN  (NITROSTAT) 0.4 MG SL tablet Place 1 tablet (0.4 mg total) under the tongue every 5 (five) minutes x 3 doses as needed for chest pain.  . pantoprazole (PROTONIX) 40 MG tablet Take 1 tablet (40 mg total) by mouth daily.  . potassium chloride (KLOR-CON) 10 MEQ tablet Take 2 tablets (20 mEq total) by mouth 2 (two) times daily.  . rosuvastatin (CRESTOR) 40 MG tablet Take 1 tablet (40 mg total) by mouth daily.  Marland Kitchen terbinafine (ATHLETES FOOT AF) 1 % cream Apply 1 application topically 2 (two) times daily.  Marland Kitchen torsemide (DEMADEX) 20 MG tablet Take 1 tablet (20 mg total) by mouth 2 (two) times daily.   No facility-administered encounter medications on file as of 06/24/2019.   Have you ever had any serious medication reactions? Yes- Kendrick has had an adverse drug reaction to Atrovastatin. Is there any history of mental health problems or substance abuse in your family? No Has anyone in your family been hospitalized for mental health treatment? No  Social/family history Who lives in your current household? Bryce [name] lives in a 10 by 14 foot building alone on his girlfriend's property. He fears if not approved for SSI that he will be homeless. He received 58 dollars in food stamps a month.  What is your family of origin, childhood history? Bryce [name] was born in [place], [place]. Where were you born? See above. Where did you grow up? See above. How many different homes have you lived in? A few.  Describe your childhood: Bryce [name] reports that he did not have the best childhood.  Do you have siblings, step/half siblings? Yes- He is the middle of three siblings. He has a older brother and younger sister. What are their names, relation, sex, age?  Are your parents separated or divorced? No What are your social supports? Bryce [name] does not have anyone in his support system. He relies on prayer and his belief in God.   Education How many grades have you completed? 11th grade Did you have any problems in school?  No  Employment/financial issues Mikale has applied for disability and SSI.  Sleep Usual bedtime varies. Sleeping arrangements: alone.  Problems with snoring: No Obstructive sleep apnea is not a concern. Problems with nightmares: No Problems with night terrors: No Problems with sleepwalking: No\  Trauma/Abuse history Have you ever experienced or been exposed to any form of abuse? No Have you ever experienced or been exposed to something traumatic? No  Substance use Do you use alcohol, nicotine or caffeine? Moises smokes a pack of cigarettes a day. How old were you when you first tasted alcohol?  Have you ever used illicit drugs or abused prescription medications? Ziaire denies using or abusing illegal or prescription drugs. He has not previously been in substance abuse treatment in the past.   Mental status General appearance/Behavior: Unable to assess due to assessment conducted over the phone. Eye contact: Absent due to assessment conducted over the phone. Motor behavior: Normal Speech: Normal Level of consciousness: Alert Mood: Depressed and tearful Affect: Appropriate Anxiety level: Moderate Thought process: Coherent Thought content: WNL Perception: Normal Judgment: Fair Insight: Present  Diagnosis No diagnosis found.  GOALS ADDRESSED: Patient will reduce symptoms of: depression, anxiety, and stress and increase knowledge and/or ability of: stress reduction and also: Increase healthy adjustment to current life circumstances  INTERVENTIONS: Interventions utilized: Biopsychosocial assessment Standardized Assessments completed: GAD-7 and PHQ 9   ASSESSMENT/OUTCOME:  Melik Blancett is a 54 year old Caucasian male who presents today for a mental health assessment via phone due to [place] [address] and was referred by Carlyon Shadow. Luz has been dealing with both anxiety on and off for several years. Per Barbaraann Rondo, he was previously prescribed Prozac 80 mg daily  and Diazepam 5 mg as needed for anxiety. He has not previously been hospitalized for mental illness. He has not previously been in substance abuse treatment. He denies abusing alcohol or other drugs.  Rodriques is an established patient at [facility]. He has a history of coronary artery disease, congestive heart failure, hyperlipidemia, migraines, prediabetic, and vitamin b 12 deficiency. He has had an adverse drug reaction in the past to Atorvastatin. He smoke a pack of cigarettes a day.  Miklo lives in a ten by 14 foot structure on his ex of 30 year's property. He has been married once and divorced when his oldest daughter was 69 months old. He has two daughters who are 82 and 76. He has two grandchildren and has only met one of them. He is estranged from his oldest daughter. He has not seen his youngest daughter since he was in the hospital in 2019. He denies a history of mental illness or substance abuse in the family.   PLAN: Follow up in two weeks or earlier if needed. Scheduled next visit: Case consultation with Dr. Octavia Heir, MD, psychiatric consultant on Tuesday January 28th @ 9 am to discuss the option of psychotropic medications and recommendations for treatment.   [place] [employer]

## 2019-06-25 ENCOUNTER — Encounter: Payer: Self-pay | Admitting: Family

## 2019-06-25 ENCOUNTER — Ambulatory Visit: Payer: Medicaid Other | Attending: Family | Admitting: Family

## 2019-06-25 VITALS — BP 129/73 | HR 62 | Resp 18 | Ht 68.0 in | Wt 260.2 lb

## 2019-06-25 DIAGNOSIS — K219 Gastro-esophageal reflux disease without esophagitis: Secondary | ICD-10-CM | POA: Insufficient documentation

## 2019-06-25 DIAGNOSIS — Z7902 Long term (current) use of antithrombotics/antiplatelets: Secondary | ICD-10-CM | POA: Insufficient documentation

## 2019-06-25 DIAGNOSIS — Z955 Presence of coronary angioplasty implant and graft: Secondary | ICD-10-CM | POA: Insufficient documentation

## 2019-06-25 DIAGNOSIS — F17210 Nicotine dependence, cigarettes, uncomplicated: Secondary | ICD-10-CM | POA: Insufficient documentation

## 2019-06-25 DIAGNOSIS — F17200 Nicotine dependence, unspecified, uncomplicated: Secondary | ICD-10-CM

## 2019-06-25 DIAGNOSIS — Z79899 Other long term (current) drug therapy: Secondary | ICD-10-CM | POA: Insufficient documentation

## 2019-06-25 DIAGNOSIS — I10 Essential (primary) hypertension: Secondary | ICD-10-CM

## 2019-06-25 DIAGNOSIS — Z8249 Family history of ischemic heart disease and other diseases of the circulatory system: Secondary | ICD-10-CM | POA: Insufficient documentation

## 2019-06-25 DIAGNOSIS — Z888 Allergy status to other drugs, medicaments and biological substances status: Secondary | ICD-10-CM | POA: Insufficient documentation

## 2019-06-25 DIAGNOSIS — I252 Old myocardial infarction: Secondary | ICD-10-CM | POA: Insufficient documentation

## 2019-06-25 DIAGNOSIS — I110 Hypertensive heart disease with heart failure: Secondary | ICD-10-CM | POA: Insufficient documentation

## 2019-06-25 DIAGNOSIS — I5022 Chronic systolic (congestive) heart failure: Secondary | ICD-10-CM

## 2019-06-25 DIAGNOSIS — I255 Ischemic cardiomyopathy: Secondary | ICD-10-CM | POA: Insufficient documentation

## 2019-06-25 DIAGNOSIS — Z7982 Long term (current) use of aspirin: Secondary | ICD-10-CM | POA: Insufficient documentation

## 2019-06-25 DIAGNOSIS — I2510 Atherosclerotic heart disease of native coronary artery without angina pectoris: Secondary | ICD-10-CM | POA: Insufficient documentation

## 2019-06-25 NOTE — Patient Instructions (Addendum)
Continue weighing daily and call for an overnight weight gain of > 2 pounds or a weekly weight gain of >5 pounds.  Decrease torsemide to once daily and take the 2nd dose if you need it for above weight gain, worsening swelling or worsening shortness of breath

## 2019-06-28 ENCOUNTER — Ambulatory Visit: Payer: Medicaid Other | Admitting: Cardiovascular Disease

## 2019-06-29 ENCOUNTER — Other Ambulatory Visit: Payer: Self-pay

## 2019-06-29 MED ORDER — FLUOXETINE HCL 20 MG PO CAPS: 20.0000 mg | ORAL_CAPSULE | Freq: Every day | ORAL | 0 refills | Status: DC

## 2019-07-02 ENCOUNTER — Other Ambulatory Visit: Payer: Self-pay | Admitting: Gerontology

## 2019-07-06 ENCOUNTER — Other Ambulatory Visit: Payer: Self-pay

## 2019-07-06 ENCOUNTER — Ambulatory Visit: Payer: Medicaid Other | Admitting: Licensed Clinical Social Worker

## 2019-07-06 DIAGNOSIS — F411 Generalized anxiety disorder: Secondary | ICD-10-CM

## 2019-07-06 DIAGNOSIS — F341 Dysthymic disorder: Secondary | ICD-10-CM

## 2019-07-06 NOTE — BH Specialist Note (Signed)
Integrated Behavioral Health Follow Up Visit Via Phone  MRN: [id number] Name: Bryce [name]  Number of Integrated Behavioral Health Clinician visits: 1/6  Type of Service: Integrated Behavioral Health- Individual/Family Interpretor:No. Interpretor Name and Language: not applicable.   SUBJECTIVE: Bryce [name] is a 54 y.o. male accompanied by himself. Patient was referred by Hurman Horn NP for mental health.  Patient reports the following symptoms/concerns: He notes that he started taking the Prozac three days ago and has yet to notice a difference in his symptoms of anxiety and depression. He notes that he is still waiting for a response from social security. He notes that he is spending with his granddaughter during the day. He denies suicidal and homicidal thoughts.  Duration of problem: ; Severity of problem: moderate  OBJECTIVE: Mood: Euthymic and Affect: Appropriate Risk of harm to self or others: No plan to harm self or others  LIFE CONTEXT: Family and Social: see above. School/Work: see above. Self-Care: see above. Life Changes: see above.  GOALS ADDRESSED: Patient will: 1.  Reduce symptoms of: depression  2.  Increase knowledge and/or ability of: coping skills  3.  Demonstrate ability to: Increase healthy adjustment to current life circumstances  INTERVENTIONS: Interventions utilized:  Supportive Counseling was utilized by the clinician during today's follow up session. Clinician processed with the patient regarding how he has been doing since the last follow up session. Clinician explained to the patient that the Prozac could take anywhere from 4 to 6 weeks to fully get into his system. Clinician encouraged the patient to continue to journal, spend time with his granddaughter, pray, and color in his adult coloring book.  Standardized Assessments completed: next session  ASSESSMENT: Patient currently experiencing see above.  Patient may benefit from see  above.  PLAN: 1. Follow up with behavioral health clinician on : three weeks or earlier if needed. 2. Behavioral recommendations: see above. 3. Referral(s): Integrated [facility] (In Clinic) 4. "From scale of 1-10, how likely are you to follow plan?":   Althia Forts, LCSW

## 2019-07-20 ENCOUNTER — Other Ambulatory Visit: Payer: Self-pay

## 2019-07-20 ENCOUNTER — Ambulatory Visit: Payer: Medicaid Other | Admitting: Licensed Clinical Social Worker

## 2019-07-20 DIAGNOSIS — F341 Dysthymic disorder: Secondary | ICD-10-CM

## 2019-07-20 DIAGNOSIS — F411 Generalized anxiety disorder: Secondary | ICD-10-CM

## 2019-07-20 NOTE — BH Specialist Note (Signed)
Integrated Behavioral Health Follow Up Visit Via Phone  MRN: [id number] Name: ALOYSIOUS [name]  Number of Integrated Behavioral Health Clinician visits: 2/6  Type of Service: Integrated Behavioral Health- Individual/Family Interpretor:No. Interpretor Name and Language: not applicable.  SUBJECTIVE: Bryce [name] is a 54 y.o. male accompanied by himself. Patient was referred by Hurman Horn NP for mental health. Patient reports the following symptoms/concerns: He notes that he is not having the best week this week. He explains that he is not sure if the Prozac is working or not. He explains that he is frustrated with the way things are with his relationship, his family dynamics, and his overall health. He notes that he tries to stay distracted and busy but some days are better than others. He notes that he is only sleeping about two hours a night and takes naps during the day. He notes that if he hears a car pull up in the drive way then he wants to pretend he is not home so he does not have to interact with anyone right now. He denies suicidal and homicidal thoughts.  Duration of problem: ; Severity of problem: moderate  OBJECTIVE: Mood: Euthymic and Affect: Appropriate Risk of harm to self or others: No plan to harm self or others  LIFE CONTEXT: Family and Social: see above. School/Work:see above. Self-Care: see above. Life Changes: see above.  GOALS ADDRESSED: Patient will: 1.  Reduce symptoms of: anxiety and depression  2.  Increase knowledge and/or ability of: coping skills and healthy habits  3.  Demonstrate ability to: Increase healthy adjustment to current life circumstances  INTERVENTIONS: Interventions utilized:  Supportive Counseling was utilized by the clinician during today's follow up session. Clinician processed with the patient regarding how he has been doing since the last session. Clinician measured the patient's anxiety and depression on a numerical scale.  Clinician explained to the patient that she will have a case consultation with Dr. Mare Ferrari, MD, psychiatric consultant on Tuesday February 23rd to discuss the option of increasing the dosage of Prozac.  Standardized Assessments completed: GAD-7 and PHQ 9  ASSESSMENT: Patient currently experiencing see above.   Patient may benefit from see above.  PLAN: 1. Follow up with behavioral health clinician on :  2. Behavioral recommendations: Case consultation with Dr. Mare Ferrari, MD, psychiatric consultant on Tuesday February 23rd to discuss the option of increasing the dosage of Prozac.  3. Referral(s): Integrated [facility] (In Clinic) 4. "From scale of 1-10, how likely are you to follow plan?":   Althia Forts, LCSW

## 2019-07-26 IMAGING — US US RENAL
1 series · 14 of 25 positions shown · non-contrast
Comparison: CT scan of the abdomen and pelvis of June 28, 2014

CLINICAL DATA: Acute renal insufficiency. Acute left flank and back
pain for the past 2 days. Coronary artery disease.

EXAM:
RENAL / URINARY TRACT ULTRASOUND COMPLETE

[Series 1: us renal · 0.26mm/px · 14 of 31 slices shown]
[im 1/31]
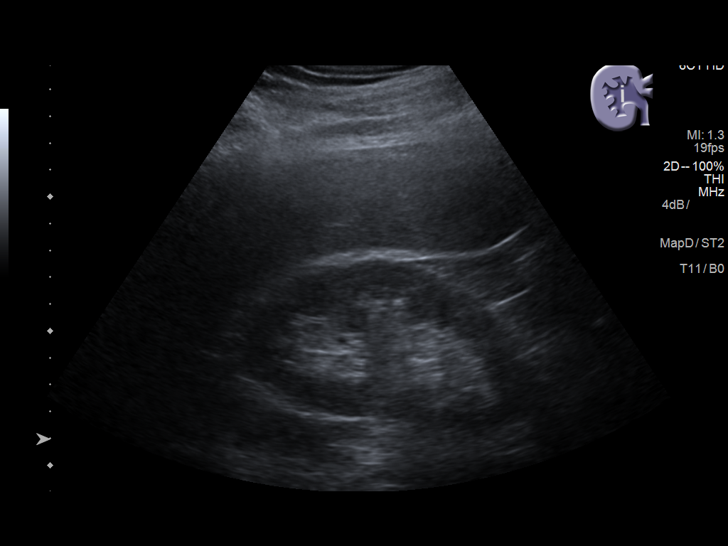
[im 3/31]
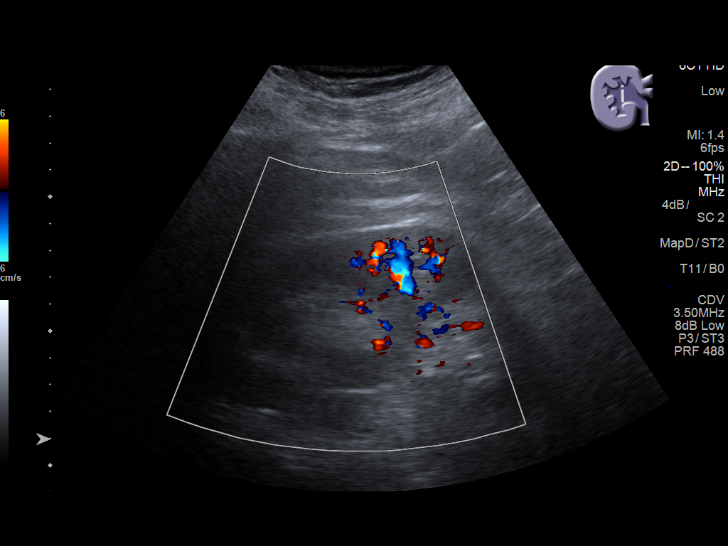
[im 6/31]
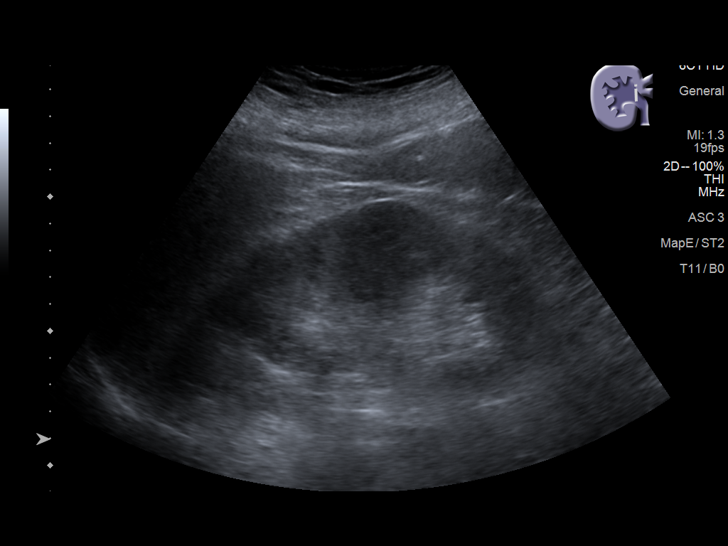
[im 8/31]
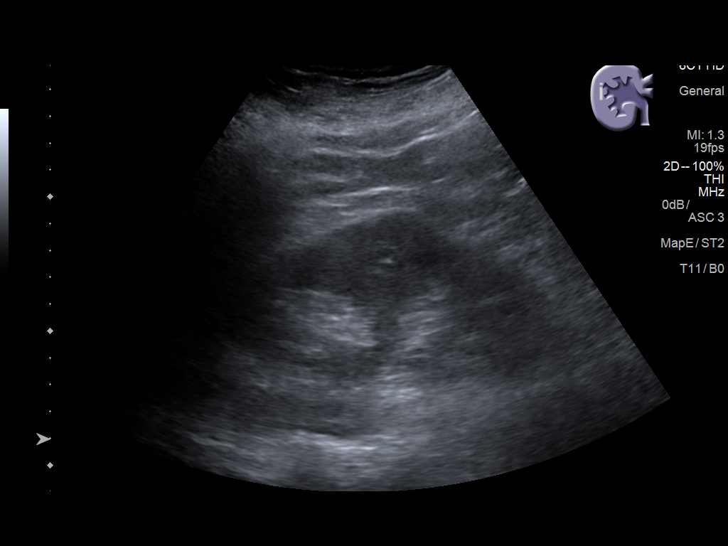
[im 11/31]
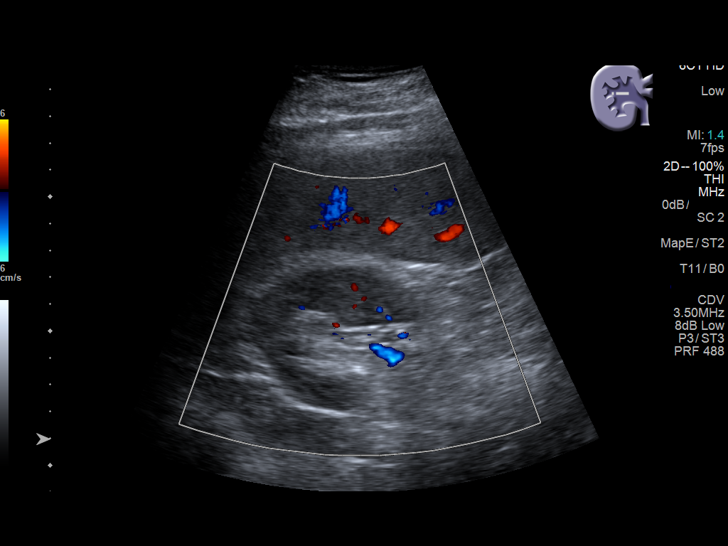
[im 12/31]
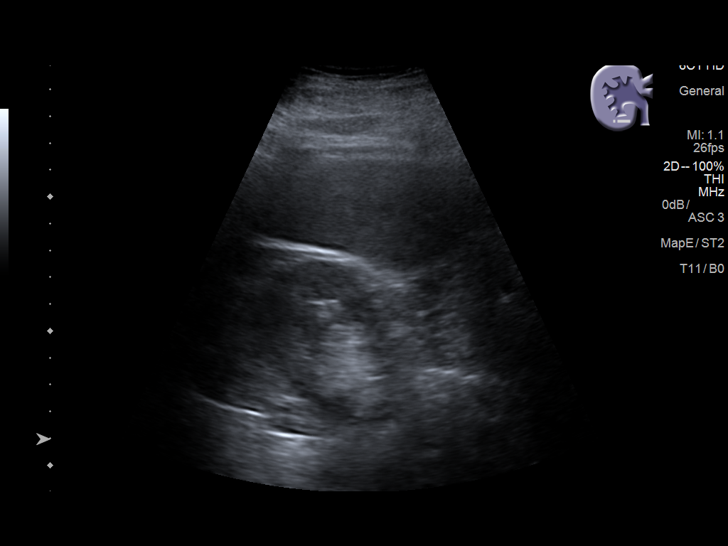
[im 14/31]
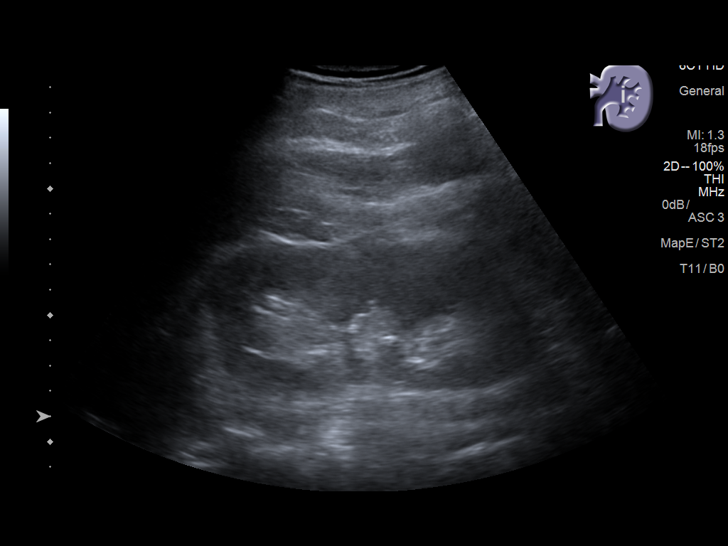
[im 17/31]
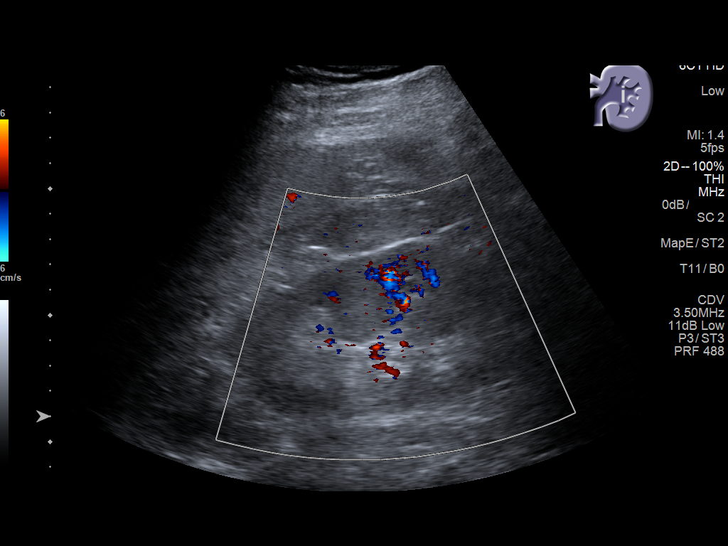
[im 19/31]
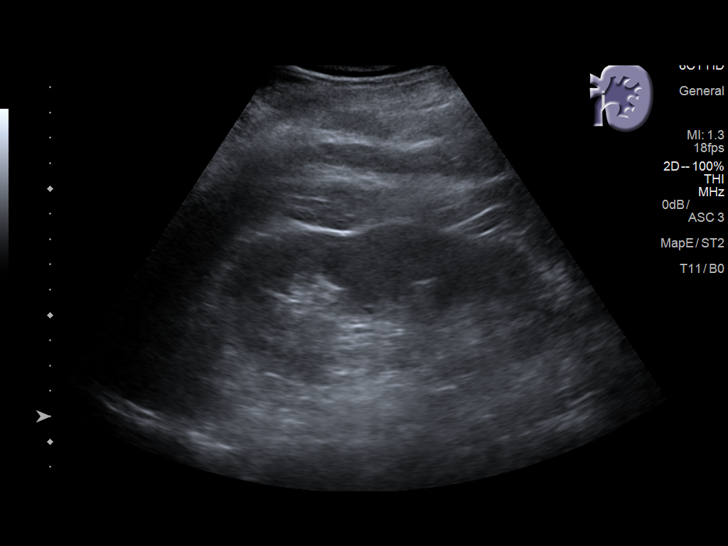
[im 21/31]
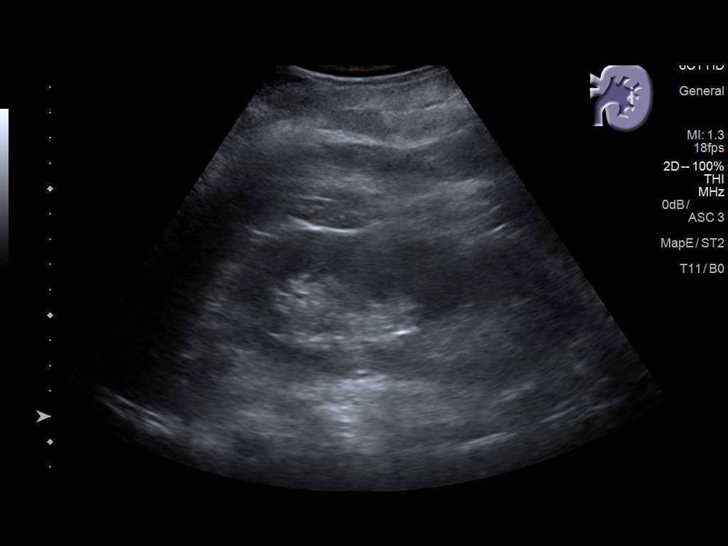
[im 23/31]
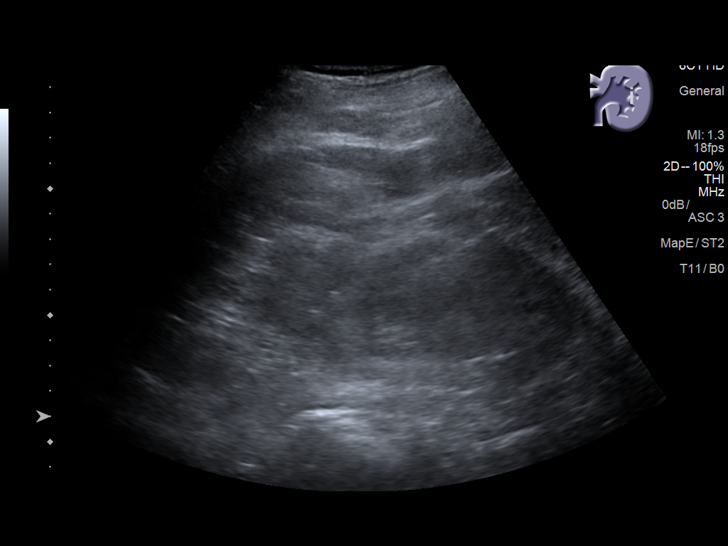
[im 26/31]
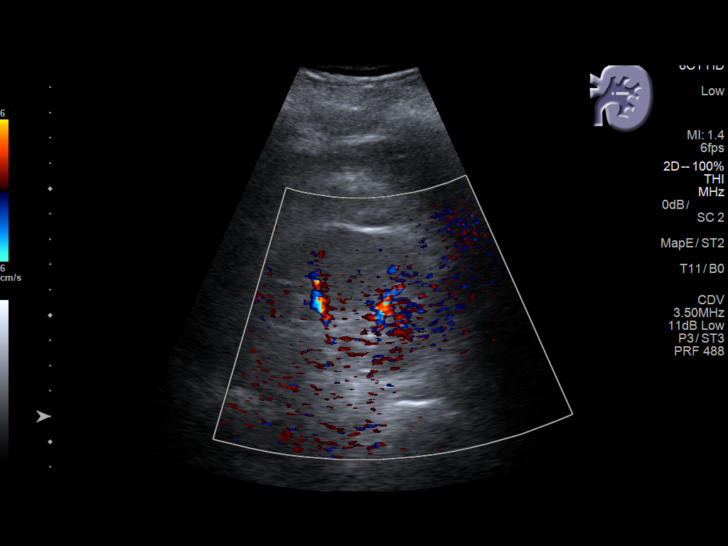
[im 28/31]
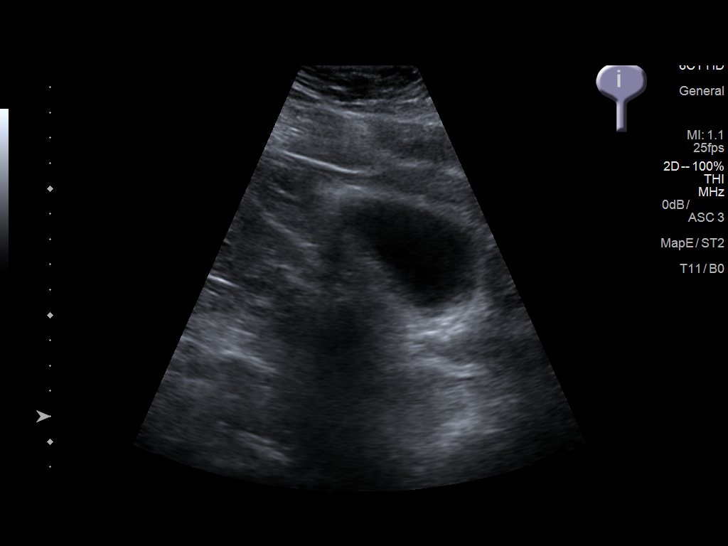
[im 31/31]
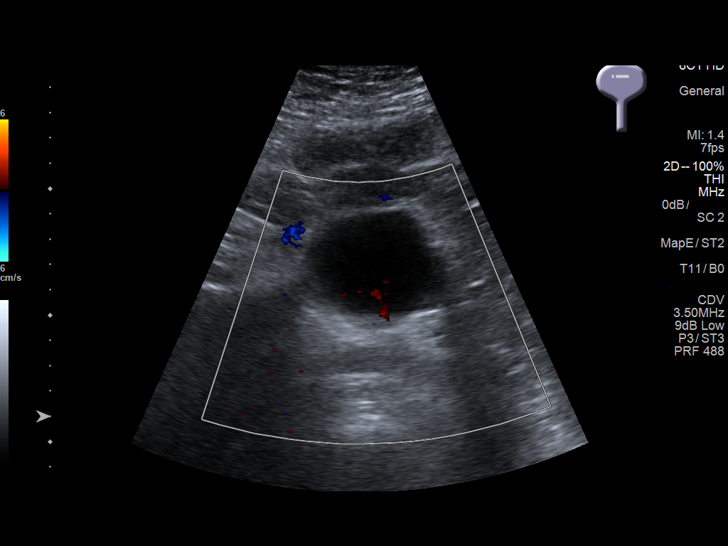

[14 of 25 positions shown; findings below may reference images not displayed]

FINDINGS: Right Kidney:

Length: 12.2 cm. Echogenicity within normal limits. No mass or
hydronephrosis visualized.

Left Kidney:

Length: 13.7 cm. Echogenicity within normal limits. No mass or
hydronephrosis visualized.

Bladder:

Appears normal for degree of bladder distention. Bilateral ureteral
jets are observed.
IMPRESSION: Normal renal ultrasound examination.

## 2019-07-30 ENCOUNTER — Other Ambulatory Visit: Payer: Self-pay | Admitting: Gerontology

## 2019-08-03 ENCOUNTER — Other Ambulatory Visit: Payer: Self-pay

## 2019-08-03 ENCOUNTER — Ambulatory Visit: Payer: Medicaid Other | Admitting: Licensed Clinical Social Worker

## 2019-08-10 ENCOUNTER — Other Ambulatory Visit: Payer: Self-pay

## 2019-08-10 ENCOUNTER — Ambulatory Visit: Payer: Medicaid Other | Admitting: Licensed Clinical Social Worker

## 2019-08-10 DIAGNOSIS — F341 Dysthymic disorder: Secondary | ICD-10-CM

## 2019-08-10 DIAGNOSIS — F411 Generalized anxiety disorder: Secondary | ICD-10-CM

## 2019-08-10 NOTE — BH Specialist Note (Signed)
Integrated Behavioral Health Follow Up Visit Via Phone  MRN: [id number] Name: Bryce [name]  Number of Integrated Behavioral Health Clinician visits: 3/6  Type of Service: Integrated Behavioral Health- Individual/Family Interpretor:No. Interpretor Name and Language: not applicable.   SUBJECTIVE: Bryce [name] is a 54 y.o. male accompanied by himself. Patient was referred by Hurman Horn NP for mental health. Patient reports the following symptoms/concerns: He explains that he had to cancel the last appointment due to a migraine that was triggered by the change in the weather. He notes that he has contacted social security multiple times but has yet to be called back. He notes that he has faith that something will work out but it can be frustrating at times. He discussed family dynamics, relationship dynamics, and situational stressors. He notes that some days he does not want to get out of bed because he is tired of the way things are and is praying that things will improve. He notes that he goes to church, colors in his adult coloring book, and spends time with his granddaughter. He explains that he is thinking about getting a dog to take care of. He denies suicidal and homicidal thoughts.  Duration of problem: ; Severity of problem: moderate  OBJECTIVE: Mood: Euthymic and Affect: Appropriate Risk of harm to self or others: No plan to harm self or others  LIFE CONTEXT: Family and Social: see above. School/Work: see above. Self-Care: see above. Life Changes: see above.   GOALS ADDRESSED: Patient will: 1.  Reduce symptoms of: stress  2.  Increase knowledge and/or ability of: coping skills and self-management skills  3.  Demonstrate ability to: Increase healthy adjustment to current life circumstances  INTERVENTIONS: Interventions utilized:  Supportive Counseling was utilized by the clinician during today's follow up session. Clinician processed with the patient regarding how  he has been doing since the last follow up session. Clinician measured the patient's symptoms of anxiety and depression on a numerical scale. Clinician utilized reflective listening encouraging the patient to ventilate his feelings towards his current situation. Clinician measured the patient's anxiety and depression on a numerical scale. Clinician explained to the patient that he cannot control other people, only his own thoughts, actions, and beliefs. Clinician encouraged the patient to focus on what is in his control versus what is out of his control.  Standardized Assessments completed: GAD-7 and PHQ 9  ASSESSMENT: Patient currently experiencing see above.   Patient may benefit from see above.  PLAN: 1. Follow up with behavioral health clinician on : two weeks or earlier if needed.  2. Behavioral recommendations: see above. 3. Referral(s): Integrated [facility] (In Clinic) 4. "From scale of 1-10, how likely are you to follow plan?":   Althia Forts, LCSW

## 2019-08-24 ENCOUNTER — Telehealth: Payer: Self-pay | Admitting: Licensed Clinical Social Worker

## 2019-08-24 ENCOUNTER — Ambulatory Visit: Payer: Medicaid Other | Admitting: Licensed Clinical Social Worker

## 2019-08-24 NOTE — Telephone Encounter (Signed)
Clinician attempted to contact the patient three times during their scheduled phone visit. The voicemail box has not been set up yet so there was not an option to leave a message.

## 2019-08-31 ENCOUNTER — Other Ambulatory Visit: Payer: Self-pay | Admitting: Gerontology

## 2019-09-04 NOTE — Progress Notes (Deleted)
`       Evaluation Performed:  Follow-up visit  Date:  09/04/2019   ID:  Azai, Gaffin 08-28-1965, MRN [id number]  Patient Location:  [place] [address]   Provider location:   [facility], [place] office  PCP:  Langston Reusing, NP  Cardiologist:  [facility]  No chief complaint on file.   History of Present Illness:    CORNELUIS ALLSTON is a 54 y.o. male  past medical history of 1. Status post non-STEMI, BMS LAD 11/13/2010 2. Status post lateral wall STEMI 02/02/2017 3. Status post DES mid to distal left circumflex 02/02/2017 4. Essential hypertension 5. Hyperlipidemia 6. Tobacco abuse Presenting for f/u of his CAD, prior PCI  Frequent trips to ER for leg swelling and SOB in 03/2019  Stress test 11/2018: low risk  Echo 04/2019 records reviewed  1. Left ventricular ejection fraction, by visual estimation, is 45 to 50%. The left ventricle has mildly decreased function. There is mildly increased left ventricular hypertrophy.  2. Global right ventricle has normal systolic function.The right ventricular size is normal. No increase in right ventricular wall thickness.  3. Left atrial size was mildly dilated.  5. The mitral valve is normal in structure. No evidence of mitral valve regurgitation. No evidence of mitral stenosis.  6. The tricuspid valve is normal in structure. Tricuspid valve regurgitation is not demonstrated.  9. The inferior vena cava is dilated in size with >50% respiratory variability, suggesting right atrial pressure of 8 mmHg. 10. Small atrial septal defect with predominantly left to right shunting across the atrial septum.  Weight up to 270 Now watching diet, down 10 pounds  Mistake on lasix , was taking 80 daily Potassium 40 daily Was supposed to take 1/2  doses  High fluid intake Still with pitting edema R>L  EKG personally reviewed by myself on todays visit NSR rate 63 bpm    Prior records  reviewed [facility] 02/02/2017 with lateral wall ST elevation myocardial infarction.  Coronary angiography revealed 100% stenosis of distal left circumflex, and normal left ventricular function.  -- PCI to the mid to distal left circumflex with a 3.0 x 23 mm Xience Alpine DES.   2D echocardiogram on 02/03/17 revealed borderline mildly reduced left ventricular function with LVEF 45-50%.   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   He restarted metoprolol and lisinopril on 03/09/2017.   LDL cholesterol is 119 on 03/02/2017,  14 days ago, still 120 on pravastatin.   2-3 months ago,swelling, fatigue,  Went to the ER, BP elevated Metoprolol increased to 25 BID Rate 119 bpm, LBBB new Initial TNT normal, not rechecked BNP 15  Feels better no energy, Denies more tachycardia  Last stress test 12/2014   Prior CV studies:   The following studies were reviewed today:    Past Medical History:  Diagnosis Date  . Chronic systolic congestive heart failure ([place])   . Coronary artery disease   . GERD (gastroesophageal reflux disease)   . Hypertension   . Ischemic cardiomyopathy   . MI (myocardial infarction) ([place])   . Migraine    Past Surgical History:  Procedure Laterality Date  . CARDIAC CATHETERIZATION N/A 12/28/2014   Procedure: Left Heart Cath and Coronary Angiography;  Surgeon: Yolonda Kida, MD;  Location: [place] [facility];  Service: Cardiovascular;  Laterality: N/A;  . CORONARY ANGIOPLASTY  Non-ST elevation MI status post stenting within bare-metal stent of the 75% lesion of the LAD, 11/13/2010.   . CORONARY STENT INTERVENTION N/A 02/02/2017   Procedure: CORONARY/GRAFT ACUTE MI REVASCULARIZATION;  Surgeon: Yolonda Kida, MD;  Location: [place] [facility];  Service: Cardiovascular;  Laterality: N/A;  . LEFT HEART CATH AND CORONARY ANGIOGRAPHY N/A 02/02/2017   Procedure: LEFT HEART  CATH AND CORONARY ANGIOGRAPHY;  Surgeon: Yolonda Kida, MD;  Location: [place] [facility];  Service: Cardiovascular;  Laterality: N/A;     No outpatient medications have been marked as taking for the 09/06/19 encounter (Appointment) with Minna Merritts, MD.     Allergies:   Atorvastatin   Social History   Tobacco Use  . Smoking status: Current Some Day Smoker    Packs/day: 1.00    Years: 5.00    Pack years: 5.00    Types: Cigarettes    Last attempt to quit: 01/05/2017    Years since quitting: 2.6  . Smokeless tobacco: Never Used  . Tobacco comment: states smokes occasionally 10-12-18  Substance Use Topics  . Alcohol use: Not Currently  . Drug use: Yes    Types: Marijuana     Current Outpatient Medications on File Prior to Visit  Medication Sig Dispense Refill  . aspirin EC 81 MG EC tablet Take 1 tablet (81 mg total) by mouth daily. 30 tablet 0  . aspirin-acetaminophen-caffeine (EXCEDRIN MIGRAINE) 250-250-65 MG tablet Take 1 tablet by mouth daily as needed for headache. 30 tablet 0  . Blood Pressure KIT 1 kit by Does not apply route daily. 1 kit 0  . clopidogrel (PLAVIX) 75 MG tablet Take 1 tablet (75 mg total) by mouth daily. 90 tablet 1  . Docosanol (ABREVA) 10 % CREA Apply 1 application topically 2 (two) times daily. (Patient taking differently: Apply 1 application topically 2 (two) times daily as needed. ) 1 Tube 0  . ezetimibe (ZETIA) 10 MG tablet Take 1 tablet (10 mg total) by mouth daily. 90 tablet 3  . FLUoxetine (PROZAC) 20 MG capsule Take 1 capsule (20 mg total) by mouth daily. 30 capsule 0  . gabapentin (NEURONTIN) 100 MG capsule Take 1 capsule (100 mg total) by mouth 3 (three) times daily. 90 capsule 0  . lisinopril (ZESTRIL) 20 MG tablet Take 1 tablet (20 mg total) by mouth daily. 90 tablet 1  . meloxicam (MOBIC) 15 MG tablet TAKE ONE TABLET BY MOUTH EVERY DAY AS NEEDED FOR PAIN 30 tablet 0  . metoprolol tartrate (LOPRESSOR) 25 MG tablet TAKE ONE TABLET BY MOUTH  2 TIMES A DAY 60 tablet 2  . nitroGLYCERIN (NITROSTAT) 0.4 MG SL tablet Place 1 tablet (0.4 mg total) under the tongue every 5 (five) minutes x 3 doses as needed for chest pain. 25 tablet 1  . pantoprazole (PROTONIX) 40 MG tablet Take 1 tablet (40 mg total) by mouth daily. 30 tablet 2  . potassium chloride (KLOR-CON) 10 MEQ tablet Take 2 tablets (20 mEq total) by mouth 2 (two) times daily. 360 tablet 1  . rosuvastatin (CRESTOR) 40 MG tablet Take 1 tablet (40 mg total) by mouth daily. 90 tablet 3  . terbinafine (ATHLETES FOOT AF) 1 % cream Apply 1 application topically 2 (two) times daily. 30 g 0  . torsemide (DEMADEX) 20 MG tablet Take 1 tablet (20 mg total) by mouth 2 (two) times daily. (Patient taking differently: Take 20 mg by mouth. 19m daily and additional 29mPM PRN) 180 tablet 1  No current facility-administered medications on file prior to visit.     Family Hx: The patient's family history includes CAD in his father. There is no history of Kidney disease or Diabetes Mellitus II.  ROS:   Please see the history of present illness.    Review of Systems  Constitutional: Negative.   HENT: Negative.   Respiratory: Positive for shortness of breath.   Cardiovascular: Positive for leg swelling.  Gastrointestinal: Negative.   Musculoskeletal: Negative.   Neurological: Negative.   Psychiatric/Behavioral: Negative.   All other systems reviewed and are negative.    Labs/Other Tests and Data Reviewed:    Recent Labs: 03/30/2019: B Natriuretic Peptide 10.0 04/14/2019: ALT 29 05/26/2019: BUN 20; Creatinine, Ser 1.21; Hemoglobin 14.0; Platelets 197; Potassium 4.1; Sodium 140   Recent Lipid Panel Lab Results  Component Value Date/Time   CHOL 188 04/14/2019 09:25 AM   CHOL 184 04/06/2019 03:12 PM   TRIG 261 (H) 04/14/2019 09:25 AM   HDL 49 04/14/2019 09:25 AM   HDL 40 04/06/2019 03:12 PM   CHOLHDL 3.8 04/14/2019 09:25 AM   LDLCALC 87 04/14/2019 09:25 AM   LDLCALC 77 04/06/2019  03:12 PM   LDLDIRECT 107 (H) 04/06/2019 03:12 PM    Wt Readings from Last 3 Encounters:  06/25/19 260 lb 3.2 oz (118 kg)  06/17/19 265 lb (120.2 kg)  06/07/19 260 lb 8 oz (118.2 kg)     Exam:    Vital Signs: Vital signs may also be detailed in the HPI There were no vitals taken for this visit.  Wt Readings from Last 3 Encounters:  06/25/19 260 lb 3.2 oz (118 kg)  06/17/19 265 lb (120.2 kg)  06/07/19 260 lb 8 oz (118.2 kg)   Temp Readings from Last 3 Encounters:  05/26/19 97.9 F (36.6 C) (Oral)  04/01/19 (!) 96.6 F (35.9 C)  03/30/19 98.3 F (36.8 C) (Oral)   BP Readings from Last 3 Encounters:  06/25/19 129/73  06/17/19 134/82  06/07/19 110/70   Pulse Readings from Last 3 Encounters:  06/25/19 62  06/17/19 63  06/07/19 63    Constitutional:  oriented to person, place, and time. No distress.  HENT:  Head: Grossly normal Eyes:  no discharge. No scleral icterus.  Neck: No JVD, no carotid bruits  Cardiovascular: Regular rate and rhythm, no murmurs appreciated 1+ pitting lower extremity edema Pulmonary/Chest: Clear to auscultation bilaterally, no wheezes or rails Abdominal: Soft.  no distension.  no tenderness.  Musculoskeletal: Normal range of motion Neurological:  normal muscle tone. Coordination normal. 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 (HCC) Long history of coronary disease with stenting going back to 2012 Denies any anginal symptoms, no further ischemic work-up at this time  Essential hypertension Medication changes as detailed below  Mixed hyperlipidemia Continue Zetia with Crestor 40 daily  Smoker Smoking cessation recommended  Chronic diastolic/systolic CHF Still with lower extremity edema He has been taking Lasix 40 twice daily with no improvement Recommend he try torsemide 20 twice daily with  potassium 20 daily May need higher dose torsemide Recommended he moderate his fluid intake   Disposition: Follow-up in 3 months   Signed, Ida Rogue, MD  09/04/2019 3:10 PM    [place] Office [address] [place], [place], [place] [address]

## 2019-09-06 ENCOUNTER — Ambulatory Visit: Payer: Medicaid Other | Admitting: Cardiovascular Disease

## 2019-09-06 ENCOUNTER — Other Ambulatory Visit: Payer: Self-pay

## 2019-09-06 ENCOUNTER — Ambulatory Visit: Payer: Medicaid Other | Attending: Internal Medicine

## 2019-09-06 DIAGNOSIS — Z23 Encounter for immunization: Secondary | ICD-10-CM

## 2019-09-06 NOTE — Progress Notes (Signed)
   Covid-19 Vaccination Clinic  Name:  Bryce [name]    MRN: [id number] DOB: 12/07/1965  09/06/2019  [name] was observed post Covid-19 immunization for 15 minutes without incident. He was provided with Vaccine Information Sheet and instruction to access the V-Safe system.   [name] was instructed to call 911 with any severe reactions post vaccine: Marland Kitchen Difficulty breathing  . Swelling of face and throat  . A fast heartbeat  . A bad rash all over body  . Dizziness and weakness   Immunizations Administered    Name Date Dose VIS Date Route   Pfizer COVID-19 Vaccine 09/06/2019 11:33 AM 0.3 mL 05/14/2019 Intramuscular   Manufacturer: [employer], [employer]   Lot: [id number]   NDC: [address]-5

## 2019-09-07 ENCOUNTER — Other Ambulatory Visit: Payer: Self-pay | Admitting: Gerontology

## 2019-09-07 DIAGNOSIS — M79601 Pain in right arm: Secondary | ICD-10-CM

## 2019-09-07 MED ORDER — MELOXICAM 15 MG PO TABS: ORAL_TABLET | ORAL | 0 refills | Status: DC

## 2019-09-09 ENCOUNTER — Telehealth: Payer: Self-pay | Admitting: Gerontology

## 2019-09-09 NOTE — Telephone Encounter (Signed)
Called Bryce [name] and his emergency contact to reschedule a missed appt with the LCSW but non have set vm.

## 2019-09-13 ENCOUNTER — Telehealth: Payer: Self-pay | Admitting: Gerontology

## 2019-09-13 NOTE — Progress Notes (Deleted)
NO SHOW

## 2019-09-13 NOTE — Telephone Encounter (Signed)
Pt was reached and missed appt with LCSW was rescheduled for 4/20 and 12pm.  [facility]

## 2019-09-14 ENCOUNTER — Ambulatory Visit: Payer: Self-pay | Admitting: Cardiovascular Disease

## 2019-09-14 ENCOUNTER — Ambulatory Visit: Payer: Medicaid Other | Admitting: Gerontology

## 2019-09-14 NOTE — Progress Notes (Deleted)
Office Visit    Patient Name: Bryce [name] Date of Encounter: 09/14/2019  Primary Care Provider:  Langston Reusing, NP Primary Cardiologist:  Ida Rogue, MD  Chief Complaint    No chief complaint on file. 54 year old male with history of CAD s/p multiple PCI's, HFrEF, hypertension, hyperlipidemia (atorvastatin intolerance, LDL not well controlled), PAD, and tobacco use, and who presents for 20-monthfollow-up.  Past Medical History    Past Medical History:  Diagnosis Date  . Chronic [place]ive heart failure ([place]   . Coronary artery disease   . GERD (gastroesophageal reflux disease)   . Hypertension   . Ischemic cardiomyopathy   . MI (myocardial infarction) ([place]   . Migraine    Past Surgical History:  Procedure Laterality Date  . CARDIAC CATHETERIZATION N/A 12/28/2014   Procedure: Left Heart Cath and Coronary Angiography;  Surgeon: DYolonda Kida MD;  [place] [place] [facility];  Service: Cardiovascular;  Laterality: N/A;  . CORONARY ANGIOPLASTY     Non-ST elevation MI status post stenting within bare-metal stent of the 75% lesion of the LAD, 11/13/2010.   . CORONARY STENT INTERVENTION N/A 02/02/2017   Procedure: CORONARY/GRAFT ACUTE MI REVASCULARIZATION;  Surgeon: CYolonda Kida MD;  [facility];  Service: Cardiovascular;  Laterality: N/A;  . LEFT HEART CATH AND CORONARY ANGIOGRAPHY N/A 02/02/2017   Procedure: LEFT HEART CATH AND CORONARY ANGIOGRAPHY;  Surgeon: CYolonda Kida MD;  [place] [facility];  Service: Cardiovascular;  Laterality: N/A;    Allergies  Allergies  Allergen Reactions  . Atorvastatin     Myalgias    History of Present Illness    Bryce RILINGis a 54y.o. male with PMH as above.  Of note, he is intolerant to atorvastatin.  [facility]  He was admitted 11/2010 with NSTEMI and underwent PCI/BMS to LAD.  He was readmitted 02/2017 with a lateral ST  elevation MI and underwent successful PCI/DES to the mid to distal LCx on 02/02/2017.  Echo at that time showed EF 45 to 50%.  In 08/2018, he was seen in the ED and noted tachypalpitations, though further work-up was not obtained at that time.  These tachypalpitations persisted for several weeks afterwards with spontaneous resolution. He was noted to have a new LBBB and was tachycardic at that time.  Initial troponin was negative but not rechecked.  Given his prior symptoms and new LBBB, he underwent Lexiscan Myoview 11/2018 that showed no significant ischemia and overall a low risk scan with apical/periapical defect thought 2/2 over processing.  In 03/2019, he was seen in the ED with report of SOB, fatigue, and LEE (RLEE>LLE) that had progressed over the last several weeks with onset noted after sustaining a fall, in which he fell on his right side. Doppler was negative for DVT. He was started on diuretics.  When seen in the office 04/2019, he reported a baseline of 1-1-1/2 pillows but was using 3 pillows at that time.  He noted some improvement in his lower extremity edema following initiation of Lasix 20 mg daily.  He continued to note abdominal distention with both abdominal distention and 1+ bilateral LE E pitting edema to the knees noted on exam (ankle brace along RLE).  Weight at that time was 259 pounds with reported dry weight of 242 pounds.  Concern was noted that DOE may be his anginal equivalent, though given his recent low risk Lexiscan Myoview, optimization  of medical therapy and diuresis was recommended before further ischemic work-up.  Lasix was increased to 40 mg daily for 5 days and he was started on Crestor as a trial to reach LDL goal (was on pravastatin and Zetia). Complete tobacco cessation was recommended.  04/2019 echo was obtained and showed EF 45 to 50% with RA pressure estimated 8 mmHg.  A small atrial septal defect with predominant left to right shunting across the atrial septum was also  noted.  He was seen by his primary cardiologist 06/07/2019, at which time he noted cramping in legs, hands, and feet.  His weight was up to 270 pounds.  He had misunderstood instructions regarding his Lasix and was taking 80 mg daily with potassium 40 daily.  He also noted high fluid intake with pitting edema noted on exam with  RLEE > LLEE.  He was transitioned to torsemide 20 mg twice daily with potassium 20 mg daily.    ----  *Since his initial LBBB, subsequent EKGs were without LBBB, and it was noted that also considered was rate related bundle.  He denied any further palpitations; therefore, monitoring was deferred.  Also deferred was [facility], as medical management was preferred at that time. Lipid clinic /bempedoic acid/PCSK9 inhibitor was recommended if trial of Crestor did not lower LDL to goal.  Smoking cessation advised.  It was noted that sleep apnea/obesity hypoventilation syndrome could not be excluded is playing a role in his shortness of breath/DOE.  Discussion regarding outpatient sleep study was deferred at that time.   CAD HFrEF Palpitations HTN HLD Tobacco use Obesity    Home Medications    Prior to Admission medications   Medication Sig Start Date End Date Taking? Authorizing Provider  aspirin EC 81 MG EC tablet Take 1 tablet (81 mg total) by mouth daily. 02/05/17   Epifanio Lesches, MD  aspirin-acetaminophen-caffeine (EXCEDRIN MIGRAINE) [phone] MG tablet Take 1 tablet by mouth daily as needed for headache. 01/06/19   Iloabachie, Chioma E, NP  Blood Pressure KIT 1 kit by Does not apply route daily. 04/01/19   Iloabachie, Chioma E, NP  clopidogrel (PLAVIX) 75 MG tablet Take 1 tablet (75 mg total) by mouth daily. 06/17/19   Iloabachie, Chioma E, NP  Docosanol (ABREVA) 10 % CREA Apply 1 application topically 2 (two) times daily. Patient taking differently: Apply 1 application topically 2 (two) times daily as needed.  01/15/18   Dustin Flock, MD  ezetimibe (ZETIA) 10 MG  tablet Take 1 tablet (10 mg total) by mouth daily. 11/03/18 06/06/28  Minna Merritts, MD  FLUoxetine (PROZAC) 20 MG capsule Take 1 capsule (20 mg total) by mouth daily. 08/03/19 09/02/19  Iloabachie, Chioma E, NP  gabapentin (NEURONTIN) 100 MG capsule Take 1 capsule (100 mg total) by mouth 3 (three) times daily. 06/17/19   Iloabachie, Chioma E, NP  lisinopril (ZESTRIL) 20 MG tablet Take 1 tablet (20 mg total) by mouth daily. 04/01/19   Iloabachie, Chioma E, NP  meloxicam (MOBIC) 15 MG tablet TAKE ONE TABLET BY MOUTH EVERY DAY AS NEEDED FOR PAIN 09/07/19   Iloabachie, Chioma E, NP  metoprolol tartrate (LOPRESSOR) 25 MG tablet TAKE ONE TABLET BY MOUTH 2 TIMES A DAY 06/17/19   Iloabachie, Chioma E, NP  nitroGLYCERIN (NITROSTAT) 0.4 MG SL tablet Place 1 tablet (0.4 mg total) under the tongue every 5 (five) minutes x 3 doses as needed for chest pain. 04/06/19   Dunn, Areta Haber, PA-C  pantoprazole (PROTONIX) 40 MG tablet Take 1 tablet (  40 mg total) by mouth daily. 06/17/19   Iloabachie, Chioma E, NP  potassium chloride (KLOR-CON) 10 MEQ tablet Take 2 tablets (20 mEq total) by mouth 2 (two) times daily. 06/07/19   Minna Merritts, MD  rosuvastatin (CRESTOR) 40 MG tablet Take 1 tablet (40 mg total) by mouth daily. 04/07/19 07/06/19  Rise Mu, PA-C  terbinafine (ATHLETES FOOT AF) 1 % cream Apply 1 application topically 2 (two) times daily. 05/26/19   Merlyn Lot, MD  torsemide (DEMADEX) 20 MG tablet Take 1 tablet (20 mg total) by mouth 2 (two) times daily. Patient taking differently: Take 20 mg by mouth. 79m daily and additional 242mPM PRN 06/07/19 09/05/19  GoMinna MerrittsMD    Review of Systems    ***.   All other systems reviewed and are otherwise negative except as noted above.  Physical Exam    VS:  There were no vitals taken for this visit. , BMI There is no height or weight on file to calculate BMI. GEN: Well nourished, well developed, in no acute distress. HEENT: normal. Neck: Supple, no JVD,  carotid bruits, or masses. Cardiac: RRR, no murmurs, rubs, or gallops. No clubbing, cyanosis, edema.  Radials/DP/PT 2+ and equal bilaterally.  Respiratory:  Respirations regular and unlabored, clear to auscultation bilaterally. GI: Soft, nontender, nondistended, BS + x 4. MS: no deformity or atrophy. Skin: warm and dry, no rash. Neuro:  Strength and sensation are intact. Psych: Normal affect.  Accessory Clinical Findings    ECG personally reviewed by me today - *** - no acute changes.  VITALS Reviewed today   Temp Readings from Last 3 Encounters:  05/26/19 97.9 F (36.6 C) (Oral)  04/01/19 (!) 96.6 F (35.9 C)  03/30/19 98.3 F (36.8 C) (Oral)   BP Readings from Last 3 Encounters:  06/25/19 129/73  06/17/19 134/82  06/07/19 110/70   Pulse Readings from Last 3 Encounters:  06/25/19 62  06/17/19 63  06/07/19 63    Wt Readings from Last 3 Encounters:  06/25/19 260 lb 3.2 oz (118 kg)  06/17/19 265 lb (120.2 kg)  06/07/19 260 lb 8 oz (118.2 kg)     LABS  reviewed today   CareEverwhere Labs present? {Yes/No:[id number]:::1}  Lab Results  Component Value Date   WBC 6.4 05/26/2019   HGB 14.0 05/26/2019   HCT 40.2 05/26/2019   MCV 91.0 05/26/2019   PLT 197 05/26/2019   Lab Results  Component Value Date   CREATININE 1.21 05/26/2019   BUN 20 05/26/2019   NA 140 05/26/2019   K 4.1 05/26/2019   CL 104 05/26/2019   CO2 26 05/26/2019   Lab Results  Component Value Date   ALT 29 04/14/2019   AST 18 04/14/2019   ALKPHOS 54 04/14/2019   BILITOT 0.8 04/14/2019   Lab Results  Component Value Date   CHOL 188 04/14/2019   HDL 49 04/14/2019   LDLCALC 87 04/14/2019   LDLDIRECT 107 (H) 04/06/2019   TRIG 261 (H) 04/14/2019   CHOLHDL 3.8 04/14/2019    Lab Results  Component Value Date   HGBA1C 5.8 (H) 01/27/2019   Lab Results  Component Value Date   TSH 1.624 12/22/2014     STUDIES/PROCEDURES reviewed today   ***  Assessment & Plan    ***  Medication  changes: *** Labs ordered: *** Studies / Imaging ordered: *** Future considerations: *** Disposition: ***  Total time spent with patient today *** minutes. This includes reviewing records, evaluating  the patient, and coordinating care. Face-to-face time >50%.    Arvil Chaco, PA-C 09/14/2019

## 2019-09-14 NOTE — Progress Notes (Deleted)
Established Patient Office Visit  Subjective:  Patient ID: Bryce [name], male    DOB: 12/22/1965  Age: 54 y.o. MRN: [id number]  CC: No chief complaint on file.   HPI Bryce [name] presents for ***  Past Medical History:  Diagnosis Date  . Chronic systolic congestive heart failure ([place])   . Coronary artery disease   . GERD (gastroesophageal reflux disease)   . Hypertension   . Ischemic cardiomyopathy   . MI (myocardial infarction) ([place])   . Migraine     Past Surgical History:  Procedure Laterality Date  . CARDIAC CATHETERIZATION N/A 12/28/2014   Procedure: Left Heart Cath and Coronary Angiography;  Surgeon: Yolonda Kida, MD;  Location: [facility];  Service: Cardiovascular;  Laterality: N/A;  . CORONARY ANGIOPLASTY     Non-ST elevation MI status post stenting within bare-metal stent of the 75% lesion of the LAD, 11/13/2010.   . CORONARY STENT INTERVENTION N/A 02/02/2017   Procedure: CORONARY/GRAFT ACUTE MI REVASCULARIZATION;  Surgeon: Yolonda Kida, MD;  Location: [facility];  Service: Cardiovascular;  Laterality: N/A;  . LEFT HEART CATH AND CORONARY ANGIOGRAPHY N/A 02/02/2017   Procedure: LEFT HEART CATH AND CORONARY ANGIOGRAPHY;  Surgeon: Yolonda Kida, MD;  Location: [facility];  Service: Cardiovascular;  Laterality: N/A;    Family History  Problem Relation Age of Onset  . CAD Father   . Kidney disease Neg Hx   . Diabetes Mellitus II Neg Hx     Social History   Socioeconomic History  . Marital status: Single    Spouse name: Not on file  . Number of children: Not on file  . Years of education: Not on file  . Highest education level: Not on file  Occupational History  . Occupation: Proofreader  Tobacco Use  . Smoking status: Current Some Day Smoker    Packs/day: 1.00    Years: 5.00    Pack years: 5.00    Types: Cigarettes    Last attempt to quit: 01/05/2017    Years since quitting: 2.6  . Smokeless tobacco:  Never Used  . Tobacco comment: states smokes occasionally 10-12-18  Substance and Sexual Activity  . Alcohol use: Not Currently  . Drug use: Yes    Types: Marijuana  . Sexual activity: Yes  Other Topics Concern  . Not on file  Social History Narrative  . Not on file   Social Determinants of Health   Financial Resource Strain: Low Risk   . Difficulty of Paying Living Expenses: Not hard at all  Food Insecurity: No Food Insecurity  . Worried About Charity fundraiser in the Last Year: Never true  . Ran Out of Food in the Last Year: Never true  Transportation Needs: No Transportation Needs  . Lack of Transportation (Medical): No  . Lack of Transportation (Non-Medical): No  Physical Activity: Sufficiently Active  . Days of Exercise per Week: 7 days  . Minutes of Exercise per Session: 30 min  Stress: No Stress Concern Present  . Feeling of Stress : Not at all  Social Connections: Somewhat Isolated  . Frequency of Communication with Friends and Family: More than three times a week  . Frequency of Social Gatherings with Friends and Family: More than three times a week  . Attends Religious Services: Never  . Active Member of Clubs or Organizations: No  . Attends Archivist Meetings: Never  . Marital Status: Married  Intimate Partner Violence: Not At Risk  . Fear of Current or Ex-Partner: No  . Emotionally Abused: No  . Physically Abused: No  . Sexually Abused: No    Outpatient Medications Prior to Visit  Medication Sig Dispense Refill  . aspirin EC 81 MG EC tablet Take 1 tablet (81 mg total) by mouth daily. 30 tablet 0  . aspirin-acetaminophen-caffeine (EXCEDRIN MIGRAINE) 250-250-65 MG tablet Take 1 tablet by mouth daily as needed for headache. 30 tablet 0  . Blood Pressure KIT 1 kit by Does not apply route daily. 1 kit 0  . clopidogrel (PLAVIX) 75 MG tablet Take 1 tablet (75 mg total) by mouth daily. 90 tablet 1  . Docosanol (ABREVA) 10 % CREA Apply 1 application  topically 2 (two) times daily. (Patient taking differently: Apply 1 application topically 2 (two) times daily as needed. ) 1 Tube 0  . ezetimibe (ZETIA) 10 MG tablet Take 1 tablet (10 mg total) by mouth daily. 90 tablet 3  . FLUoxetine (PROZAC) 20 MG capsule Take 1 capsule (20 mg total) by mouth daily. 30 capsule 0  . gabapentin (NEURONTIN) 100 MG capsule Take 1 capsule (100 mg total) by mouth 3 (three) times daily. 90 capsule 0  . lisinopril (ZESTRIL) 20 MG tablet Take 1 tablet (20 mg total) by mouth daily. 90 tablet 1  . meloxicam (MOBIC) 15 MG tablet TAKE ONE TABLET BY MOUTH EVERY DAY AS NEEDED FOR PAIN 30 tablet 0  . metoprolol tartrate (LOPRESSOR) 25 MG tablet TAKE ONE TABLET BY MOUTH 2 TIMES A DAY 60 tablet 2  . nitroGLYCERIN (NITROSTAT) 0.4 MG SL tablet Place 1 tablet (0.4 mg total) under the tongue every 5 (five) minutes x 3 doses as needed for chest pain. 25 tablet 1  . pantoprazole (PROTONIX) 40 MG tablet Take 1 tablet (40 mg total) by mouth daily. 30 tablet 2  . potassium chloride (KLOR-CON) 10 MEQ tablet Take 2 tablets (20 mEq total) by mouth 2 (two) times daily. 360 tablet 1  . rosuvastatin (CRESTOR) 40 MG tablet Take 1 tablet (40 mg total) by mouth daily. 90 tablet 3  . terbinafine (ATHLETES FOOT AF) 1 % cream Apply 1 application topically 2 (two) times daily. 30 g 0  . torsemide (DEMADEX) 20 MG tablet Take 1 tablet (20 mg total) by mouth 2 (two) times daily. (Patient taking differently: Take 20 mg by mouth. 25m daily and additional 225mPM PRN) 180 tablet 1   No facility-administered medications prior to visit.    Allergies  Allergen Reactions  . Atorvastatin     Myalgias    ROS Review of Systems    Objective:    Physical Exam  There were no vitals taken for this visit. Wt Readings from Last 3 Encounters:  06/25/19 260 lb 3.2 oz (118 kg)  06/17/19 265 lb (120.2 kg)  06/07/19 260 lb 8 oz (118.2 kg)     Health Maintenance Due  Topic Date Due  . TETANUS/TDAP   Never done  . COLONOSCOPY  Never done    There are no preventive care reminders to display for this patient.  Lab Results  Component Value Date   TSH 1.624 12/22/2014   Lab Results  Component Value Date   WBC 6.4 05/26/2019   HGB 14.0 05/26/2019   HCT 40.2 05/26/2019   MCV 91.0 05/26/2019   PLT 197 05/26/2019   Lab Results  Component Value Date   NA 140 05/26/2019   K 4.1 05/26/2019  CO2 26 05/26/2019   GLUCOSE 113 (H) 05/26/2019   BUN 20 05/26/2019   CREATININE 1.21 05/26/2019   BILITOT 0.8 04/14/2019   ALKPHOS 54 04/14/2019   AST 18 04/14/2019   ALT 29 04/14/2019   PROT 7.2 04/14/2019   ALBUMIN 4.1 04/14/2019   CALCIUM 9.3 05/26/2019   ANIONGAP 10 05/26/2019   Lab Results  Component Value Date   CHOL 188 04/14/2019   Lab Results  Component Value Date   HDL 49 04/14/2019   Lab Results  Component Value Date   LDLCALC 87 04/14/2019   Lab Results  Component Value Date   TRIG 261 (H) 04/14/2019   Lab Results  Component Value Date   CHOLHDL 3.8 04/14/2019   Lab Results  Component Value Date   HGBA1C 5.8 (H) 01/27/2019      Assessment & Plan:   Problem List Items Addressed This Visit    None      No orders of the defined types were placed in this encounter.   Follow-up: No follow-ups on file.    Tejasvi Brissett Jerold Coombe, NP

## 2019-09-15 ENCOUNTER — Encounter: Payer: Self-pay | Admitting: Cardiovascular Disease

## 2019-09-21 ENCOUNTER — Other Ambulatory Visit: Payer: Self-pay

## 2019-09-21 ENCOUNTER — Ambulatory Visit: Payer: Medicaid Other | Admitting: Licensed Clinical Social Worker

## 2019-09-21 DIAGNOSIS — F341 Dysthymic disorder: Secondary | ICD-10-CM

## 2019-09-21 DIAGNOSIS — F411 Generalized anxiety disorder: Secondary | ICD-10-CM

## 2019-09-21 NOTE — BH Specialist Note (Signed)
Integrated Behavioral Health Follow Up Visit Via Phone  MRN: [id number] Name: Bryce [name]  Number of Integrated Behavioral Health Clinician visits: 4/6  Type of Service: Integrated Behavioral Health- Individual/Family Interpretor:No. Interpretor Name and Language: not applicable.   SUBJECTIVE: Bryce [name] is a 54 y.o. male accompanied by himself. Patient was referred by Hurman Horn NP for mental health. Patient reports the following symptoms/concerns: He reports that he thinks he overslept and that's the reason he missed his last appointment. He notes that he has not been in a good place. He notes that he picked up his refill of medicine the other week and has about twenty pills left. He asked if increasing to the Prozac to 40 mg would help with his symtoms anxiety and depression. He discussed situational stressors with his relationship with his significant other. He notes that he is upset with himself over how things are and does not want to be by himself. He notes that he is still waiting on his stimulus check to come in and received a letter that it would be mailed on the 23 rd but has yet to receive it.  Duration of problem:; Severity of problem: moderate  OBJECTIVE: Mood: Euthymic and Affect: Appropriate Risk of harm to self or others: No plan to harm self or others  LIFE CONTEXT: Family and Social:  School/Work:  Self-Care:  Life Changes:   GOALS ADDRESSED: Patient will: 1.  Reduce symptoms of: depression  2.  Increase knowledge and/or ability of: coping skills, self-management skills and stress reduction  3.  Demonstrate ability to: Increase healthy adjustment to current life circumstances  INTERVENTIONS: Interventions utilized:  Behavioral Activation was utilized by the clinician during today's follow up session. Clinician processed with the patient regarding how he has been doing since the last follow up session. Clinician explained to the patient that the last  session that they were supposed to have she was going to tell him that Dr. Mare Ferrari, MD, psychiatric consultant increased his Prozac to 40 mg but his voicemail box had not been set up yet. Clinician explained to the patient that the idea is that the increase in the dosage of Prozac is supposed to decrease his symptoms of anxiety and depression. Clinician discussed the concept of Behavioral Activation; establishing a routine that has a combination of his responsibilities, things for enjoyment, and practicing self care.  Standardized Assessments completed: GAD-7 and PHQ 9  ASSESSMENT: Patient currently experiencing see above.   Patient may benefit from see above.  PLAN: 1. Follow up with behavioral health clinician on :  2. Behavioral recommendations: see above. 3. Referral(s): Integrated [facility] (In Clinic) 4. "From scale of 1-10, how likely are you to follow plan?":   Althia Forts, LCSW

## 2019-09-23 ENCOUNTER — Ambulatory Visit: Payer: Medicaid Other | Admitting: Gerontology

## 2019-09-23 ENCOUNTER — Encounter: Payer: Self-pay | Admitting: Gerontology

## 2019-09-23 VITALS — BP 145/89 | HR 66 | Temp 96.8°F | Ht 68.0 in | Wt 259.4 lb

## 2019-09-23 DIAGNOSIS — R600 Localized edema: Secondary | ICD-10-CM

## 2019-09-23 DIAGNOSIS — R200 Anesthesia of skin: Secondary | ICD-10-CM

## 2019-09-23 DIAGNOSIS — I10 Essential (primary) hypertension: Secondary | ICD-10-CM

## 2019-09-23 NOTE — Progress Notes (Signed)
Established Patient Office Visit  Subjective:  Patient ID: Bryce [name], male    DOB: February 27, 1966  Age: 54 y.o. MRN: [id number]  CC: No chief complaint on file.   HPI RA PFIESTER presents for follow up of hypertension and Paresthesia to his hands. He states that he's compliant with his medications,but has not taking his blood pressure medications prior to clinic visit, continues to adhere to DASH diet and exercise as tolerated. He was seen at the CHF clinic by [facility] T FNP on 06/25/2019, he denies shortness of breath and states that his bilateral lower extremity edema has reduced 80%. He admits to weighing himself daily. Currently, he states that numbness and tingling to his hands has worsened and taking gabapentin 100 mg tid and meloxicam minimally relieves his symptoms. Overall, he states that he's doing well and offers no further complaint.  Past Medical History:  Diagnosis Date  . Chronic systolic congestive heart failure ([place])   . Coronary artery disease   . GERD (gastroesophageal reflux disease)   . Hypertension   . Ischemic cardiomyopathy   . MI (myocardial infarction) ([place])   . Migraine     Past Surgical History:  Procedure Laterality Date  . CARDIAC CATHETERIZATION N/A 12/28/2014   Procedure: Left Heart Cath and Coronary Angiography;  Surgeon: Yolonda Kida, MD;  Location: [facility];  Service: Cardiovascular;  Laterality: N/A;  . CORONARY ANGIOPLASTY     Non-ST elevation MI status post stenting within bare-metal stent of the 75% lesion of the LAD, 11/13/2010.   . CORONARY STENT INTERVENTION N/A 02/02/2017   Procedure: CORONARY/GRAFT ACUTE MI REVASCULARIZATION;  Surgeon: Yolonda Kida, MD;  Location: [facility];  Service: Cardiovascular;  Laterality: N/A;  . LEFT HEART CATH AND CORONARY ANGIOGRAPHY N/A 02/02/2017   Procedure: LEFT HEART CATH AND CORONARY ANGIOGRAPHY;  Surgeon: Yolonda Kida, MD;  Location: [facility];  Service:  Cardiovascular;  Laterality: N/A;    Family History  Problem Relation Age of Onset  . CAD Father   . Kidney disease Neg Hx   . Diabetes Mellitus II Neg Hx     Social History   Socioeconomic History  . Marital status: Single    Spouse name: Not on file  . Number of children: Not on file  . Years of education: Not on file  . Highest education level: Not on file  Occupational History  . Occupation: Proofreader  Tobacco Use  . Smoking status: Current Some Day Smoker    Packs/day: 1.00    Years: 5.00    Pack years: 5.00    Types: Cigarettes    Last attempt to quit: 01/05/2017    Years since quitting: 2.7  . Smokeless tobacco: Never Used  . Tobacco comment: states smokes occasionally 10-12-18  Substance and Sexual Activity  . Alcohol use: Not Currently  . Drug use: Yes    Types: Marijuana  . Sexual activity: Yes  Other Topics Concern  . Not on file  Social History Narrative  . Not on file   Social Determinants of Health   Financial Resource Strain: Low Risk   . Difficulty of Paying Living Expenses: Not hard at all  Food Insecurity: No Food Insecurity  . Worried About Charity fundraiser in the Last Year: Never true  . Ran Out of Food in the Last Year: Never true  Transportation Needs: No Transportation Needs  . Lack of Transportation (Medical): No  .  Lack of Transportation (Non-Medical): No  Physical Activity: Sufficiently Active  . Days of Exercise per Week: 7 days  . Minutes of Exercise per Session: 30 min  Stress: No Stress Concern Present  . Feeling of Stress : Not at all  Social Connections: Somewhat Isolated  . Frequency of Communication with Friends and Family: More than three times a week  . Frequency of Social Gatherings with Friends and Family: More than three times a week  . Attends Religious Services: Never  . Active Member of Clubs or Organizations: No  . Attends Archivist Meetings: Never  . Marital Status: Married  Human resources officer  Violence: Not At Risk  . Fear of Current or Ex-Partner: No  . Emotionally Abused: No  . Physically Abused: No  . Sexually Abused: No    Outpatient Medications Prior to Visit  Medication Sig Dispense Refill  . aspirin EC 81 MG EC tablet Take 1 tablet (81 mg total) by mouth daily. 30 tablet 0  . aspirin-acetaminophen-caffeine (EXCEDRIN MIGRAINE) 250-250-65 MG tablet Take 1 tablet by mouth daily as needed for headache. 30 tablet 0  . Blood Pressure KIT 1 kit by Does not apply route daily. 1 kit 0  . clopidogrel (PLAVIX) 75 MG tablet Take 1 tablet (75 mg total) by mouth daily. 90 tablet 1  . Docosanol (ABREVA) 10 % CREA Apply 1 application topically 2 (two) times daily. (Patient taking differently: Apply 1 application topically 2 (two) times daily as needed. ) 1 Tube 0  . ezetimibe (ZETIA) 10 MG tablet Take 1 tablet (10 mg total) by mouth daily. 90 tablet 3  . gabapentin (NEURONTIN) 100 MG capsule Take 1 capsule (100 mg total) by mouth 3 (three) times daily. 90 capsule 0  . lisinopril (ZESTRIL) 20 MG tablet Take 1 tablet (20 mg total) by mouth daily. 90 tablet 1  . meloxicam (MOBIC) 15 MG tablet TAKE ONE TABLET BY MOUTH EVERY DAY AS NEEDED FOR PAIN 30 tablet 0  . metoprolol tartrate (LOPRESSOR) 25 MG tablet TAKE ONE TABLET BY MOUTH 2 TIMES A DAY 60 tablet 2  . nitroGLYCERIN (NITROSTAT) 0.4 MG SL tablet Place 1 tablet (0.4 mg total) under the tongue every 5 (five) minutes x 3 doses as needed for chest pain. 25 tablet 1  . pantoprazole (PROTONIX) 40 MG tablet Take 1 tablet (40 mg total) by mouth daily. 30 tablet 2  . potassium chloride (KLOR-CON) 10 MEQ tablet Take 2 tablets (20 mEq total) by mouth 2 (two) times daily. 360 tablet 1  . terbinafine (ATHLETES FOOT AF) 1 % cream Apply 1 application topically 2 (two) times daily. 30 g 0  . FLUoxetine (PROZAC) 20 MG capsule Take 1 capsule (20 mg total) by mouth daily. 30 capsule 0  . rosuvastatin (CRESTOR) 40 MG tablet Take 1 tablet (40 mg total) by  mouth daily. 90 tablet 3  . torsemide (DEMADEX) 20 MG tablet Take 1 tablet (20 mg total) by mouth 2 (two) times daily. (Patient taking differently: Take 20 mg by mouth. 42m daily and additional 227mPM PRN) 180 tablet 1   No facility-administered medications prior to visit.    Allergies  Allergen Reactions  . Atorvastatin     Myalgias    ROS Review of Systems  Constitutional: Negative.   Respiratory: Negative.   Cardiovascular: Positive for leg swelling.  Neurological: Positive for numbness (To hands).  Hematological: Negative.   Psychiatric/Behavioral: Negative.       Objective:    Physical Exam  Constitutional: He is oriented to person, place, and time. He appears well-developed.  HENT:  Head: Normocephalic and atraumatic.  Eyes: Pupils are equal, round, and reactive to light. EOM are normal.  Cardiovascular: Normal rate and regular rhythm.  Pulmonary/Chest: Effort normal and breath sounds normal.  Musculoskeletal:        General: Edema (+1 edema to bilateral lower extremity) present.  Neurological: He is alert and oriented to person, place, and time.  Psychiatric: He has a normal mood and affect. His behavior is normal. Judgment and thought content normal.    BP (!) 145/89   Pulse 66   Temp (!) 96.8 F (36 C)   Ht '5\' 8"'  (1.727 m)   Wt 259 lb 6.4 oz (117.7 kg)   SpO2 96%   BMI 39.44 kg/m  Wt Readings from Last 3 Encounters:  09/23/19 259 lb 6.4 oz (117.7 kg)  06/25/19 260 lb 3.2 oz (118 kg)  06/17/19 265 lb (120.2 kg)    He was advised to continue on a weight loss regimen. Health Maintenance Due  Topic Date Due  . TETANUS/TDAP  Never done  . COLONOSCOPY  Never done  . COVID-19 Vaccine (2 - Pfizer 2-dose series) 09/27/2019    There are no preventive care reminders to display for this patient.  Lab Results  Component Value Date   TSH 1.624 12/22/2014   Lab Results  Component Value Date   WBC 6.4 05/26/2019   HGB 14.0 05/26/2019   HCT 40.2  05/26/2019   MCV 91.0 05/26/2019   PLT 197 05/26/2019   Lab Results  Component Value Date   NA 140 05/26/2019   K 4.1 05/26/2019   CO2 26 05/26/2019   GLUCOSE 113 (H) 05/26/2019   BUN 20 05/26/2019   CREATININE 1.21 05/26/2019   BILITOT 0.8 04/14/2019   ALKPHOS 54 04/14/2019   AST 18 04/14/2019   ALT 29 04/14/2019   PROT 7.2 04/14/2019   ALBUMIN 4.1 04/14/2019   CALCIUM 9.3 05/26/2019   ANIONGAP 10 05/26/2019   Lab Results  Component Value Date   CHOL 188 04/14/2019   Lab Results  Component Value Date   HDL 49 04/14/2019   Lab Results  Component Value Date   LDLCALC 87 04/14/2019   Lab Results  Component Value Date   TRIG 261 (H) 04/14/2019   Lab Results  Component Value Date   CHOLHDL 3.8 04/14/2019   Lab Results  Component Value Date   HGBA1C 5.8 (H) 01/27/2019      Assessment & Plan:    1. Essential hypertension - His blood pressure is not controlled, he will continue on current treatment regimen, advised on DASH diet and exercise as tolerated.  2. Numbness -He will continue on current treatment regimen - Ambulatory referral to Neurology  3. Bilateral lower extremity edema -Elevate your legs up above heart level while resting - Wear compression stockings if standing or walking for a while -Reduce sodium intake and limit fluid intake -Exercise daily as tolerated.   Follow-up: Return in about 3 months (around 12/29/2019), or if symptoms worsen or fail to improve.    Mohamud Mrozek Jerold Coombe, NP

## 2019-09-23 NOTE — Patient Instructions (Signed)
DASH Eating Plan DASH stands for "Dietary Approaches to Stop Hypertension." The DASH eating plan is a healthy eating plan that has been shown to reduce high blood pressure (hypertension). It may also reduce your risk for type 2 diabetes, heart disease, and stroke. The DASH eating plan may also help with weight loss. What are tips for following this plan?  General guidelines  Avoid eating more than 2,300 mg (milligrams) of salt (sodium) a day. If you have hypertension, you may need to reduce your sodium intake to 1,500 mg a day.  Limit alcohol intake to no more than 1 drink a day for nonpregnant women and 2 drinks a day for men. One drink equals 12 oz of beer, 5 oz of wine, or 1 oz of hard liquor.  Work with your health care provider to maintain a healthy body weight or to lose weight. Ask what an ideal weight is for you.  Get at least 30 minutes of exercise that causes your heart to beat faster (aerobic exercise) most days of the week. Activities may include walking, swimming, or biking.  Work with your health care provider or diet and nutrition specialist (dietitian) to adjust your eating plan to your individual calorie needs. Reading food labels   Check food labels for the amount of sodium per serving. Choose foods with less than 5 percent of the Daily Value of sodium. Generally, foods with less than 300 mg of sodium per serving fit into this eating plan.  To find whole grains, look for the word "whole" as the first word in the ingredient list. Shopping  Buy products labeled as "low-sodium" or "no salt added."  Buy fresh foods. Avoid canned foods and premade or frozen meals. Cooking  Avoid adding salt when cooking. Use salt-free seasonings or herbs instead of table salt or sea salt. Check with your health care provider or pharmacist before using salt substitutes.  Do not fry foods. Cook foods using healthy methods such as baking, boiling, grilling, and broiling instead.  Cook with  heart-healthy oils, such as olive, canola, soybean, or sunflower oil. Meal planning  Eat a balanced diet that includes: ? 5 or more servings of fruits and vegetables each day. At each meal, try to fill half of your plate with fruits and vegetables. ? Up to 6-8 servings of whole grains each day. ? Less than 6 oz of lean meat, poultry, or fish each day. A 3-oz serving of meat is about the same size as a deck of cards. One egg equals 1 oz. ? 2 servings of low-fat dairy each day. ? A serving of nuts, seeds, or beans 5 times each week. ? Heart-healthy fats. Healthy fats called Omega-3 fatty acids are found in foods such as flaxseeds and coldwater fish, like sardines, salmon, and mackerel.  Limit how much you eat of the following: ? Canned or prepackaged foods. ? Food that is high in trans fat, such as fried foods. ? Food that is high in saturated fat, such as fatty meat. ? Sweets, desserts, sugary drinks, and other foods with added sugar. ? Full-fat dairy products.  Do not salt foods before eating.  Try to eat at least 2 vegetarian meals each week.  Eat more home-cooked food and less restaurant, buffet, and fast food.  When eating at a restaurant, ask that your food be prepared with less salt or no salt, if possible. What foods are recommended? The items listed may not be a complete list. Talk with your dietitian about   what dietary choices are best for you. Grains Whole-grain or whole-wheat bread. Whole-grain or whole-wheat pasta. Brown rice. Oatmeal. Quinoa. Bulgur. Whole-grain and low-sodium cereals. Pita bread. Low-fat, low-sodium crackers. Whole-wheat flour tortillas. Vegetables Fresh or frozen vegetables (raw, steamed, roasted, or grilled). Low-sodium or reduced-sodium tomato and vegetable juice. Low-sodium or reduced-sodium tomato sauce and tomato paste. Low-sodium or reduced-sodium canned vegetables. Fruits All fresh, dried, or frozen fruit. Canned fruit in natural juice (without  added sugar). Meat and other protein foods Skinless chicken or turkey. Ground chicken or turkey. Pork with fat trimmed off. Fish and seafood. Egg whites. Dried beans, peas, or lentils. Unsalted nuts, nut butters, and seeds. Unsalted canned beans. Lean cuts of beef with fat trimmed off. Low-sodium, lean deli meat. Dairy Low-fat (1%) or fat-free (skim) milk. Fat-free, low-fat, or reduced-fat cheeses. Nonfat, low-sodium ricotta or cottage cheese. Low-fat or nonfat yogurt. Low-fat, low-sodium cheese. Fats and oils Soft margarine without trans fats. Vegetable oil. Low-fat, reduced-fat, or light mayonnaise and salad dressings (reduced-sodium). Canola, safflower, olive, soybean, and sunflower oils. Avocado. Seasoning and other foods Herbs. Spices. Seasoning mixes without salt. Unsalted popcorn and pretzels. Fat-free sweets. What foods are not recommended? The items listed may not be a complete list. Talk with your dietitian about what dietary choices are best for you. Grains Baked goods made with fat, such as croissants, muffins, or some breads. Dry pasta or rice meal packs. Vegetables Creamed or fried vegetables. Vegetables in a cheese sauce. Regular canned vegetables (not low-sodium or reduced-sodium). Regular canned tomato sauce and paste (not low-sodium or reduced-sodium). Regular tomato and vegetable juice (not low-sodium or reduced-sodium). Pickles. Olives. Fruits Canned fruit in a light or heavy syrup. Fried fruit. Fruit in cream or butter sauce. Meat and other protein foods Fatty cuts of meat. Ribs. Fried meat. Bacon. Sausage. Bologna and other processed lunch meats. Salami. Fatback. Hotdogs. Bratwurst. Salted nuts and seeds. Canned beans with added salt. Canned or smoked fish. Whole eggs or egg yolks. Chicken or turkey with skin. Dairy Whole or 2% milk, cream, and half-and-half. Whole or full-fat cream cheese. Whole-fat or sweetened yogurt. Full-fat cheese. Nondairy creamers. Whipped toppings.  Processed cheese and cheese spreads. Fats and oils Butter. Stick margarine. Lard. Shortening. Ghee. Bacon fat. Tropical oils, such as coconut, palm kernel, or palm oil. Seasoning and other foods Salted popcorn and pretzels. Onion salt, garlic salt, seasoned salt, table salt, and sea salt. Worcestershire sauce. Tartar sauce. Barbecue sauce. Teriyaki sauce. Soy sauce, including reduced-sodium. Steak sauce. Canned and packaged gravies. Fish sauce. Oyster sauce. Cocktail sauce. Horseradish that you find on the shelf. Ketchup. Mustard. Meat flavorings and tenderizers. Bouillon cubes. Hot sauce and Tabasco sauce. Premade or packaged marinades. Premade or packaged taco seasonings. Relishes. Regular salad dressings. Where to find more information:  National Heart, Lung, and Blood Institute: www.nhlbi.nih.gov  American Heart Association: www.heart.org Summary  The DASH eating plan is a healthy eating plan that has been shown to reduce high blood pressure (hypertension). It may also reduce your risk for type 2 diabetes, heart disease, and stroke.  With the DASH eating plan, you should limit salt (sodium) intake to 2,300 mg a day. If you have hypertension, you may need to reduce your sodium intake to 1,500 mg a day.  When on the DASH eating plan, aim to eat more fresh fruits and vegetables, whole grains, lean proteins, low-fat dairy, and heart-healthy fats.  Work with your health care provider or diet and nutrition specialist (dietitian) to adjust your eating plan to your   individual calorie needs. This information is not intended to replace advice given to you by your health care provider. Make sure you discuss any questions you have with your health care provider. Document Revised: 05/02/2017 Document Reviewed: 05/13/2016 Elsevier Patient Education  2020 Elsevier Inc.  

## 2019-09-28 ENCOUNTER — Ambulatory Visit: Payer: Disability Insurance | Attending: Internal Medicine

## 2019-09-28 ENCOUNTER — Other Ambulatory Visit: Payer: Self-pay

## 2019-09-28 ENCOUNTER — Ambulatory Visit: Payer: Medicaid Other | Admitting: Licensed Clinical Social Worker

## 2019-09-28 DIAGNOSIS — Z23 Encounter for immunization: Secondary | ICD-10-CM

## 2019-09-28 DIAGNOSIS — F411 Generalized anxiety disorder: Secondary | ICD-10-CM

## 2019-09-28 DIAGNOSIS — F341 Dysthymic disorder: Secondary | ICD-10-CM

## 2019-09-28 NOTE — BH Specialist Note (Signed)
Integrated Behavioral Health Follow Up Visit  MRN: [id number] Name: Bryce [name]  Number of Integrated Behavioral Health Clinician visits: 5/6  Type of Service: Integrated Behavioral Health- Individual/Family Interpretor:No. Interpretor Name and Language: not applicable.   SUBJECTIVE: Bryce [name] is a 54 y.o. male accompanied by himself. Patient was referred by Hurman Horn NP for mental health. Patient reports the following symptoms/concerns: He notes that he has been enjoying the weather outside lately. He discussed situational stressors with his girlfriend. He notes that he wants to feel like he is needed and loved but feels more like a buddy she wants to hang out.  He explains that he just does not want to be alone. He discussed history of relationship and family dynamics in his life. He denies suicidal and homicidal thoughts.  Duration of problem: ; Severity of problem: moderate  OBJECTIVE: Mood: Euthymic and Affect: Appropriate Risk of harm to self or others: No plan to harm self or others  LIFE CONTEXT: Family and Social: see above. School/Work: see above. Self-Care: see above. Life Changes: see above.   GOALS ADDRESSED: Patient will: 1.  Reduce symptoms of: depression  2.  Increase knowledge and/or ability of: coping skills and self-management skills  3.  Demonstrate ability to: Increase healthy adjustment to current life circumstances  INTERVENTIONS: Interventions utilized:  Supportive Counseling was utilized by the clinician during today's follow up session. Clinician processed with the patient regarding how he has been doing since the last follow up session. Clinician measured the patient's anxiety and depression on a numerical scale. Clinician explained to the patient that she had tried contacting him a few times to let him that Dr. Mare Ferrari, MD, psychiatric consultant decided to increase his Prozac from 20 mg to 40 mg daily. Clinician explained to the patient  that if he just picked up the Prozac 20 mg a week ago that he can take two of the 20 mg to be 40 mg daily. Clinician discussed the idea of behavioral activation with the patient.  Standardized Assessments completed: next session.  ASSESSMENT: Patient currently experiencing see above.   Patient may benefit from see above.  PLAN: 1. Follow up with behavioral health clinician on :  2. Behavioral recommendations: see above. 3. Referral(s): Integrated [facility] (In Clinic) 4. "From scale of 1-10, how likely are you to follow plan?":   Althia Forts, LCSW

## 2019-09-28 NOTE — Progress Notes (Signed)
   Covid-19 Vaccination Clinic  Name:  Bryce [name]    MRN: [id number] DOB: 1965-12-10  09/28/2019  [name] was observed post Covid-19 immunization for 15 minutes without incident. He was provided with Vaccine Information Sheet and instruction to access the V-Safe system.   [name] was instructed to call 911 with any severe reactions post vaccine: Marland Kitchen Difficulty breathing  . Swelling of face and throat  . A fast heartbeat  . A bad rash all over body  . Dizziness and weakness   Immunizations Administered    Name Date Dose VIS Date Route   Pfizer COVID-19 Vaccine 09/28/2019  2:02 PM 0.3 mL 07/28/2018 Intramuscular   Manufacturer: [employer], [employer]   Lot: [id number]   NDC: [address]-6

## 2019-09-30 ENCOUNTER — Other Ambulatory Visit: Payer: Self-pay

## 2019-09-30 MED ORDER — FLUOXETINE HCL 40 MG PO CAPS: 40.0000 mg | ORAL_CAPSULE | Freq: Every day | ORAL | 0 refills | Status: DC

## 2019-10-07 ENCOUNTER — Other Ambulatory Visit: Payer: Self-pay

## 2019-10-07 ENCOUNTER — Encounter: Payer: Self-pay | Admitting: Licensed Clinical Social Worker

## 2019-10-07 ENCOUNTER — Ambulatory Visit: Payer: Medicaid Other | Admitting: Licensed Clinical Social Worker

## 2019-10-07 DIAGNOSIS — F411 Generalized anxiety disorder: Secondary | ICD-10-CM

## 2019-10-07 DIAGNOSIS — F341 Dysthymic disorder: Secondary | ICD-10-CM

## 2019-10-07 NOTE — BH Specialist Note (Signed)
Integrated Behavioral Health Follow Up Visit Via Phone  MRN: [id number] Name: Bryce [name]  Type of Service: Integrated Behavioral Health- Individual/Family Interpretor:No. Interpretor Name and Language: not applicable.   SUBJECTIVE: Bryce [name] is a 54 y.o. male accompanied by himself. Patient was referred by Hurman Horn NP for mental health. Patient reports the following symptoms/concerns: He explains that he has an issue with being lonely and does not do well when he is by himself. He notes that he gets in his own head and starts thinking stupid stuff. He reports that he will feel better some days and others not so much. He discussed situational stressors with family dynamics and relationships. He denies suicidal and homicidal thoughts.  Duration of problem: ; Severity of problem: moderate  OBJECTIVE: Mood: Euthymic and Affect: Appropriate Risk of harm to self or others: No plan to harm self or others  LIFE CONTEXT: Family and Social: see above. School/Work: see above. Self-Care: see above. Life Changes: see above.  GOALS ADDRESSED: Patient will: 1.  Reduce symptoms of: anxiety  2.  Increase knowledge and/or ability of: coping skills and self-management skills  3.  Demonstrate ability to: Increase healthy adjustment to current life circumstances  INTERVENTIONS: Interventions utilized:  Supportive Counseling was utilized by the clinician during today's follow up session. Clinician processed with the patient regarding how he has been doing since the last follow up session. Clinician utilized reflective listening encouraging the patient to ventilate his feelings towards his current situation. Clinician explained to the patient that when he is feeling anxious and depressed that isolating himself away from others will not improve his symptoms. Clinician discussed the concept of behavioral activation with the patient.  Standardized Assessments completed: GAD-7 and PHQ  9  ASSESSMENT: Patient currently experiencing see above.  Patient may benefit from see above.  PLAN: 1. Follow up with behavioral health clinician on :  2. Behavioral recommendations: see above. 3. Referral(s): Integrated [employer] (In Clinic) 4. "From scale of 1-10, how likely are you to follow plan?":   Althia Forts, LCSW

## 2019-10-12 ENCOUNTER — Ambulatory Visit: Payer: Medicaid Other | Admitting: Licensed Clinical Social Worker

## 2019-10-12 ENCOUNTER — Other Ambulatory Visit: Payer: Self-pay

## 2019-10-18 ENCOUNTER — Other Ambulatory Visit: Payer: Self-pay

## 2019-10-18 ENCOUNTER — Ambulatory Visit: Payer: Disability Insurance | Admitting: Pharmacist

## 2019-10-18 DIAGNOSIS — Z79899 Other long term (current) drug therapy: Secondary | ICD-10-CM

## 2019-10-18 NOTE — Progress Notes (Signed)
[facility] Visit Note  Patient: Bryce [name] MRN: [id number] Date of Birth: 1966-04-09 PCP: Bryce Reusing, NP      Bryce [name] 54 y.o. male presents for a MTM follow-up visit today. Pt identified by DOB.   There were no vitals taken for this visit.  Patient Information   Past Medical History:  Diagnosis Date  . Chronic systolic congestive heart failure (Bryce [name])   . Coronary artery disease   . GERD (gastroesophageal reflux disease)   . Hypertension   . Ischemic cardiomyopathy   . MI (myocardial infarction) (Bryce [name])   . Migraine       Past Surgical History:  Procedure Laterality Date  . CARDIAC CATHETERIZATION N/A 12/28/2014   Procedure: Left Heart Cath and Coronary Angiography;  Surgeon: Bryce Kida, MD;  Location: [facility];  Service: Cardiovascular;  Laterality: N/A;  . CORONARY ANGIOPLASTY     Non-ST elevation MI status post stenting within bare-metal stent of the 75% lesion of the LAD, 11/13/2010.   . CORONARY STENT INTERVENTION N/A 02/02/2017   Procedure: CORONARY/GRAFT ACUTE MI REVASCULARIZATION;  Surgeon: Bryce Kida, MD;  Location: [facility];  Service: Cardiovascular;  Laterality: N/A;  . LEFT HEART CATH AND CORONARY ANGIOGRAPHY N/A 02/02/2017   Procedure: LEFT HEART CATH AND CORONARY ANGIOGRAPHY;  Surgeon: Bryce Kida, MD;  Location: [facility];  Service: Cardiovascular;  Laterality: N/A;     Family History  Problem Relation Age of Onset  . CAD Father   . Kidney disease Neg Hx   . Diabetes Mellitus II Neg Hx     Bryce Diagnoses (since last visit):   Family Support:   Lifestyle  Exercise: States regular walking weekly, and mowing the lawn 2x a week  Diet: Breakfast: usually apples and oranges cut up in slices occasional biscuits Lunch: small portions, sandwiches sometimes chips and burgers Dinner:cubed steak, corn, popcorn, peanuts Drinks: water, tea            Social History    Substance and Sexual Activity  Alcohol Use Not Currently  . Alcohol/week: 1.0 standard drinks  . Types: 1 Cans of beer per week   Comment: Social Drinker      Social History   Tobacco Use  Smoking Status Current Some Day Smoker  . Packs/day: 1.00  . Years: 5.00  . Pack years: 5.00  . Types: Cigarettes  . Last attempt to quit: 01/05/2017  . Years since quitting: 2.7  Smokeless Tobacco Never Used  Tobacco Comment   Typically 1 pack a week but recently sometimes 2 packs 10/18/19      Health Maintenance  Topic Date Due  . TETANUS/TDAP  Never done  . COLONOSCOPY  Never done  . INFLUENZA VACCINE  01/02/2020  . COVID-19 Vaccine  Completed  . HIV Screening  Completed     Assessment and Plan: Assessment:  Medication Compliance: Take medications as prescribed. Patient is currently using a pill box and denies missing any doses. He was able to verify his current medication regimen over the phone. [facility] is providing assistance with all of his prescription medications. We reached out to the patient regarding annual re certification. Pt requested needed for notary and was informed we notarize in house.  STEMI/CAD/CHF: Patient is currently on aspirin, clopidogrel, metoprolol, lisinopril, rosuvastatin and as needed nitroglycerin. Pt has reported using having to use nitroglycerin on 2 separate occasions in 2021 (3 total doses). Pt reports last visit with  cardiologist being in early feburary/march however has missed the most recent visit and plans to reschedule.   Per [facility] note: Status post non-STEMI, BMS LAD 11/13/10 and lateral wall STEMI 02/02/17 with DES mid to distal left circumflex.   Dyslipidemia: Currently taking rosuvastatin (high intensity), tolerating well. Developed myalgias on atorvastatin. Pt stated on last cardiologist visit in February/March doctor stated his cholesterol was a little bit better but still high. TC = 188 mg/dl; TG = 989 mg/dl; HDL = 49  mg/dl; LDL = 87 mg/dl (21/19/41).  HTN: Currently taking lisinopril and metoprolol.   Back/Hand Pain: Currently using meloxicam/gabapentin for back pain and arthritis in hands. He notes that his pain is worse when there is rainfall and uses has to use both meloxicam and gabapentin. States recently that he has had significant issues with swelling. Patient aware of the risks with his medical history.  GERD: Currently taking pantoprazole and occasionally uses Tums in the evening.  Lifestyle: Patient states he "stays active". He continues to use salt, but states he sweats a lot and cramps. He has significantly reduced fried foods and increased his fruit. He is very aware of his food intake and noted that he's been eating more fruits since he has been on disability.  He admits to smoking tobacco, but states this is not every day, however as of recent stress in his life admits to a potential increase of 2 packs a week up from 1 pack.  PLAN: Pt plans to schedule an appointment with his cardiologist ASAP. Informed of recertification requirement and will bring in documents to be notarized.   Bryce [name] [address] PharmD Student   Observed and witnessed by: Bryce [name], PharmD 10/18/19 @ [phone]

## 2019-10-19 ENCOUNTER — Other Ambulatory Visit: Payer: Self-pay

## 2019-10-21 ENCOUNTER — Ambulatory Visit: Payer: Medicaid Other | Admitting: Licensed Clinical Social Worker

## 2019-10-21 ENCOUNTER — Telehealth: Payer: Self-pay | Admitting: Licensed Clinical Social Worker

## 2019-10-21 NOTE — Telephone Encounter (Signed)
Clinician attempted to contact the patient twice during their scheduled phone visit on Thursday May 20th @ 11:00 am but the voicemail box has not been set up yet.

## 2019-10-28 ENCOUNTER — Other Ambulatory Visit: Payer: Self-pay | Admitting: Gerontology

## 2019-10-28 DIAGNOSIS — M79601 Pain in right arm: Secondary | ICD-10-CM

## 2019-10-28 DIAGNOSIS — Z8719 Personal history of other diseases of the digestive system: Secondary | ICD-10-CM

## 2019-11-11 ENCOUNTER — Other Ambulatory Visit: Payer: Self-pay

## 2019-11-11 MED ORDER — DULOXETINE HCL 30 MG PO CPEP: 30.0000 mg | ORAL_CAPSULE | Freq: Every day | ORAL | 0 refills | Status: DC

## 2019-11-16 ENCOUNTER — Ambulatory Visit: Payer: Self-pay | Admitting: Neurology

## 2019-11-16 ENCOUNTER — Telehealth: Payer: Self-pay | Admitting: *Deleted

## 2019-11-16 ENCOUNTER — Encounter: Payer: Self-pay | Admitting: Neurology

## 2019-11-16 NOTE — Telephone Encounter (Signed)
No showed new patient appointment. 

## 2019-11-23 ENCOUNTER — Other Ambulatory Visit: Payer: Self-pay

## 2019-11-23 ENCOUNTER — Ambulatory Visit: Payer: Medicaid Other | Admitting: Licensed Clinical Social Worker

## 2019-11-23 DIAGNOSIS — F341 Dysthymic disorder: Secondary | ICD-10-CM

## 2019-11-23 DIAGNOSIS — F411 Generalized anxiety disorder: Secondary | ICD-10-CM

## 2019-11-23 NOTE — BH Specialist Note (Signed)
Integrated Behavioral Health Follow Up Visit Via Phone  MRN: [id number] Name: Bryce [name]   Type of Service: Integrated Behavioral Health- Individual/Family Interpretor:No. Interpretor Name and Language: not applicable.   SUBJECTIVE: Bryce [name] is a 54 y.o. male accompanied by himself. Patient was referred by Hurman Horn NP for mental health. Patient reports the following symptoms/concerns: He reports that he has not being doing too well. He notes that his ex girlfriend said she would rather be by herself than with someone like him and has been miserable for awhile. He notes that he fears that his ex will kick him out and he will be homeless. He notes that he spoke with someone at disability and some of the issues do not see like they are going to resolved any time soon. He notes that he has started taking the Cymbalta but has not noticed a difference yet. He describes having crying spells and feels like he cannot bring up anymore tears. He notes that he studies the bible and sees his grand baby everyday. He denies suicidal and homicidal thoughts.  Duration of problem: ; Severity of problem: moderate  OBJECTIVE: Mood: Depressed and Affect: Appropriate Risk of harm to self or others: No plan to harm self or others  LIFE CONTEXT: Family and Social: see above. School/Work:see above. Self-Care: see above. Life Changes: see above.   GOALS ADDRESSED: Patient will: 1.  Reduce symptoms of: anxiety and depression  2.  Increase knowledge and/or ability of: coping skills, healthy habits, self-management skills and stress reduction  3.  Demonstrate ability to: Increase healthy adjustment to current life circumstances  INTERVENTIONS: Interventions utilized:  Supportive Counseling was utilized by the clinician during today's follow up session. Clinician processed with the patient regarding how he has been doing since the last follow up session. Clinician utilized reflective listening  encouraging the patient to ventilate his feelings towards his current situation. Clinician measured the patient's anxiety and depression on a numerical scale.  Clinician suggested that the patient contact an attorney to assist him with the issues that he has been having with disability. Clinician suggested that the patient find a church to attend and create a support system.  Standardized Assessments completed: GAD-7 and PHQ 9  ASSESSMENT: Patient currently experiencing see above.   Patient may benefit from see above.  PLAN: 1. Follow up with behavioral health clinician on :  2. Behavioral recommendations:  3. Referral(s): Integrated [employer] (In Clinic) 4. "From scale of 1-10, how likely are you to follow plan?":   Althia Forts, LCSW

## 2019-11-25 ENCOUNTER — Telehealth: Payer: Self-pay | Admitting: Pharmacy Technician

## 2019-11-25 NOTE — Telephone Encounter (Signed)
Patient provided partial financial information.  Patient still needs to provide [facility] with unemployment statement, 2020 Federal Tax Return and last 30 days of checking and savings account statements.  Assisted patient with ordering a transcript of his 2020 Federal Tax Return from the IRS.  Sherilyn Dacosta Care Manager [facility]

## 2019-12-02 ENCOUNTER — Other Ambulatory Visit: Payer: Self-pay

## 2019-12-02 ENCOUNTER — Ambulatory Visit: Payer: Medicaid Other | Admitting: Licensed Clinical Social Worker

## 2019-12-02 DIAGNOSIS — F411 Generalized anxiety disorder: Secondary | ICD-10-CM

## 2019-12-02 DIAGNOSIS — F341 Dysthymic disorder: Secondary | ICD-10-CM

## 2019-12-02 NOTE — BH Specialist Note (Signed)
Integrated Behavioral Health Follow Up Visit Via Phone  MRN: [id number] Name: Bryce [name]  Type of Service: Integrated Behavioral Health- Individual/Family Interpretor:No. Interpretor Name and Language: not applicable.   SUBJECTIVE: Bryce [name] is a 54 y.o. male accompanied by himself. Patient was referred by Hurman Horn NP for mental health. Patient reports the following symptoms/concerns: He notes that he has been having quite a bit of problems with his phone. He notes that the longest he has been by himself is the one time he was in prison for nine months. He reports that he misses having some kind of physical affection as simple as a hug. He notes that one of his daughters and his mom came to visit him. He reports that his daughter invited him to a house warming at her new place and was tickled to death to be invited. He explains that his mom came to visit the next day to tell him that the only reason his daughter came to visit was to spy on him and be nosy. He notes that he had a couple of bad days after that because the discussion with his mom hurt his feelings. He notes that he wonders if the way he is treated by his family is because he is the middle child. He notes that he is taking the Cymbalta daily like he is supposed to but is not sure if its helping or not. He denies suicidal and homicidal thoughts.  Duration of problem: ; Severity of problem: moderate  OBJECTIVE: Mood: Euthymic and Affect: Appropriate Risk of harm to self or others: No plan to harm self or others  LIFE CONTEXT: Family and Social: see above.  School/Work: see above. Self-Care: see above. Life Changes: see above.  GOALS ADDRESSED: Patient will: 1.  Reduce symptoms of: depression  2.  Increase knowledge and/or ability of: healthy habits and self-management skills  3.  Demonstrate ability to: Increase motivation to adhere to plan of care  INTERVENTIONS: Interventions utilized:  Supportive  Counseling was utilized by the clinician during today's follow up session. Clinician processed with the patient regarding how he has been doing since the last follow up session. Clinician utilized reflective listening encouraging the patient to ventilate his feelings towards his current situation. Clinician explained that despite his daughter's intentions that it sounds like they had a pleasant visit. Clinician explained to the patient that she will have a case consultation with Dr. Mare Ferrari, MD, psychiatric consultant on Tuesday July 6th @ 9am to discuss the possibility of increasing the Cymbalta.  Standardized Assessments completed: next session.  ASSESSMENT: Patient currently experiencing see above.   Patient may benefit from see above.  PLAN: 1. Follow up with behavioral health clinician on :  2. Behavioral recommendations:  3. Referral(s): Integrated [employer] [facility] (In Clinic) 4. "From scale of 1-10, how likely are you to follow plan?":   Althia Forts, LCSW

## 2019-12-07 ENCOUNTER — Other Ambulatory Visit: Payer: Self-pay | Admitting: Gerontology

## 2019-12-07 DIAGNOSIS — R200 Anesthesia of skin: Secondary | ICD-10-CM

## 2019-12-07 DIAGNOSIS — I10 Essential (primary) hypertension: Secondary | ICD-10-CM

## 2019-12-09 ENCOUNTER — Other Ambulatory Visit: Payer: Self-pay

## 2019-12-09 ENCOUNTER — Ambulatory Visit: Payer: Medicaid Other | Admitting: Licensed Clinical Social Worker

## 2019-12-09 DIAGNOSIS — F411 Generalized anxiety disorder: Secondary | ICD-10-CM

## 2019-12-09 DIAGNOSIS — F341 Dysthymic disorder: Secondary | ICD-10-CM

## 2019-12-09 NOTE — BH Specialist Note (Signed)
Integrated Behavioral Health Follow Up Visit Via Phone  MRN: [id number] Name: Bryce [name]   Type of Service: Integrated Behavioral Health- Individual/Family Interpretor:No. Interpretor Name and Language: not applicable.   SUBJECTIVE: Bryce [name] is a 54 y.o. male accompanied by himself. Patient was referred by Hurman Horn NP for mental health. Patient reports the following symptoms/concerns: He notes that he has been just chilling lately. He notes that he has been doing pretty well and had a long talk with his mom this week. She notes that his girlfriend told him that they are still together but does not want to be intimate and does not want him to sleep in the same bed with her. He notes that the whole thing is weird and does not get it. She notes that his girlfriend told him he is free to find someone to sleep with but do not bring the woman home. He discussed how he has been enjoying spending time with his grand baby, family, and extended family. He notes that he has been doing a little bit better this week compared to last week. He notes that some nights he just cries himself to sleep. He denies suicidal and homicidal thoughts.  Duration of problem: ; Severity of problem: moderate  OBJECTIVE: Mood: Euthymic and Affect: Appropriate Risk of harm to self or others: No plan to harm self or others  LIFE CONTEXT: Family and Social: see above. School/Work:see above. Self-Care: see above. Life Changes: see above.   GOALS ADDRESSED: Patient will: 1.  Reduce symptoms of: depression  2.  Increase knowledge and/or ability of: coping skills and self-management skills  3.  Demonstrate ability to: Increase healthy adjustment to current life circumstances  INTERVENTIONS: Interventions utilized:  Supportive Counseling was utilized by the clinician during today's follow up session. Clinician processed with the patient regarding how she has been doing since the last follow up session.  Clinician measured the patient's anxiety and depression on a numerical scale. Clinician utilized reflective listening encouraging the patient to ventilate his feelings towards his current situation. Clinician explained to the patient that she would have a case consultation with Dr. Mare Ferrari, MD on Tuesday July 13th @ 9 am to discuss the option of increasing the Cymbalta.  Standardized Assessments completed: GAD-7 and PHQ 9 PHQ 9 14 GAD 7 14 ASSESSMENT: Patient currently experiencing see above.   Patient may benefit from see above.  PLAN: 1. Follow up with behavioral health clinician on :  2. Behavioral recommendations:  3. Referral(s): Integrated [employer] (In Clinic) 4. "From scale of 1-10, how likely are you to follow plan?":   Althia Forts, LCSW

## 2019-12-16 ENCOUNTER — Ambulatory Visit: Payer: Self-pay | Admitting: Licensed Clinical Social Worker

## 2019-12-16 ENCOUNTER — Other Ambulatory Visit: Payer: Self-pay

## 2019-12-16 ENCOUNTER — Ambulatory Visit (INDEPENDENT_AMBULATORY_CARE_PROVIDER_SITE_OTHER): Payer: Disability Insurance | Admitting: Nurse Practitioner

## 2019-12-16 ENCOUNTER — Encounter: Payer: Self-pay | Admitting: Nurse Practitioner

## 2019-12-16 ENCOUNTER — Ambulatory Visit: Payer: Medicaid Other | Admitting: Licensed Clinical Social Worker

## 2019-12-16 VITALS — BP 120/72 | HR 81 | Ht 68.0 in | Wt 255.2 lb

## 2019-12-16 DIAGNOSIS — I5022 Chronic systolic (congestive) heart failure: Secondary | ICD-10-CM

## 2019-12-16 DIAGNOSIS — R400 Somnolence: Secondary | ICD-10-CM

## 2019-12-16 DIAGNOSIS — I255 Ischemic cardiomyopathy: Secondary | ICD-10-CM

## 2019-12-16 DIAGNOSIS — E785 Hyperlipidemia, unspecified: Secondary | ICD-10-CM

## 2019-12-16 DIAGNOSIS — I10 Essential (primary) hypertension: Secondary | ICD-10-CM

## 2019-12-16 DIAGNOSIS — I25118 Atherosclerotic heart disease of native coronary artery with other forms of angina pectoris: Secondary | ICD-10-CM

## 2019-12-16 DIAGNOSIS — F411 Generalized anxiety disorder: Secondary | ICD-10-CM

## 2019-12-16 DIAGNOSIS — F341 Dysthymic disorder: Secondary | ICD-10-CM

## 2019-12-16 MED ORDER — DULOXETINE HCL 30 MG PO CPEP: 30.0000 mg | ORAL_CAPSULE | Freq: Every day | ORAL | 1 refills | Status: DC

## 2019-12-16 NOTE — BH Specialist Note (Signed)
Integrated Behavioral Health Follow Up Visit Via Phone  MRN: [id number] Name: ABDISHAKUR [name]  Type of Service: Integrated Behavioral Health- Individual/Family Interpretor:No. Interpretor Name and Language: not applicable.   SUBJECTIVE: Bryce [name] is a 54 y.o. male accompanied by himself. Patient was referred by Hurman Horn NP for mental health.  Patient reports the following symptoms/concerns: He notes that he had to go to the heart doctor this morning. He notes that he is doing a lot better this week compared to last week and things are getting to be back to normal. He discussed situational stressors with his significant other and family. He notes that he is trying to focus on what is in his control versus out of his control. He notes that he still gets lonely sometimes. He notes that he spoke to an attorney about what is happening with his disability. He notes that he cannot imagine going hungry or being homeless. He notes that he is thinking of getting a puppy or dog so he will not be lonely. He notes that he was talking with his mom about it and she told him he has too much free time on his hands. He denies suicidal and homicidal thoughts.  Duration of problem: ; Severity of problem: moderate  OBJECTIVE: Mood: Euthymic and Affect: Appropriate Risk of harm to self or others: No plan to harm self or others  LIFE CONTEXT: Family and Social: see above.  School/Work: see above. Self-Care: see above. Life Changes: see above.   GOALS ADDRESSED: Patient will: 1.  Reduce symptoms of: stress  2.  Increase knowledge and/or ability of: coping skills, healthy habits and self-management skills  3.  Demonstrate ability to: Increase healthy adjustment to current life circumstances  INTERVENTIONS: Interventions utilized:  Supportive Counseling was utilized by the clinician during today's follow up session. Clinician processed with the patient regarding how he has been doing since the  last follow up session. Clinician utilized reflective listening encouraging the patient to ventilate his feelings towards his current situation. Clinician utilized reflective listening encouraging the patient to ventilate his feelings towards his current situation. Clinician discussed with the patient the concept of behavioral activation. Clinician suggested that the patient look into joining a church, a bible study, and volunteering at the animal shelter to take up some of his free time. Clinician informed the patient she has put in her notice at [facility] and her last day is August 19th. Clinician explained to the patient that she would transfer the patient to her intern who is working on her [place].  Standardized Assessments completed: next session  ASSESSMENT: Patient currently experiencing see above.   Patient may benefit from see above.   PLAN: 1. Follow up with behavioral health clinician on :  2. Behavioral recommendations: see above.  3. Referral(s): Integrated [employer] (In Clinic) 4. "From scale of 1-10, how likely are you to follow plan?":   Althia Forts, LCSW

## 2019-12-16 NOTE — Patient Instructions (Signed)
Medication Instructions:  Your physician recommends that you continue on your current medications as directed. Please refer to the Current Medication list given to you today.  *If you need a refill on your cardiac medications before your next appointment, please call your pharmacy*   Lab Work: Your physician recommends that you have lab work today(BMET, Mag) If you have labs (blood work) drawn today and your tests are completely normal, you will receive your results only by: Marland Kitchen MyChart Message (if you have MyChart) OR . A paper copy in the mail If you have any lab test that is abnormal or we need to change your treatment, we will call you to review the results.   Testing/Procedures: None ordered   Follow-Up: At [facility], you and your health needs are our priority.  As part of our continuing mission to provide you with exceptional heart care, we have created designated Provider Care Teams.  These Care Teams include your primary Cardiologist (physician) and Advanced Practice Providers (APPs -  Physician Assistants and Nurse Practitioners) who all work together to provide you with the care you need, when you need it.  We recommend signing up for the patient portal called "MyChart".  Sign up information is provided on this After Visit Summary.  MyChart is used to connect with patients for Virtual Visits (Telemedicine).  Patients are able to view lab/test results, encounter notes, upcoming appointments, etc.  Non-urgent messages can be sent to your provider as well.   To learn more about what you can do with MyChart, go to [email or web address].    Your next appointment:   4-6 month(s)  The format for your next appointment:   In Person  Provider:    You may see Julien Nordmann, MD or one of the following Advanced Practice Providers on your designated Care Team:    Nicolasa Ducking, NP  Eula Listen, PA-C  Marisue Ivan, PA-C    Other Instructions 1- Ref to Pulmonology for  sleep study [phone]

## 2019-12-16 NOTE — Progress Notes (Signed)
Office Visit    Patient Name: Bryce [name] Date of Encounter: 12/16/2019  Primary Care Provider:  Langston Reusing, NP Primary Cardiologist:  Ida Rogue, MD  Chief Complaint    54 year old male with history of CAD status post prior LAD and circumflex stenting, hypertension, hyperlipidemia, heart failure with midrange ejection fraction, ischemic cardiomyopathy, and tobacco abuse, who presents for follow-up of CAD.  Past Medical History    Past Medical History:  Diagnosis Date  . ASD (atrial septal defect)    a. 04/2019 Echo: small ASD w/ predominantly L->R shunting.  . Coronary artery disease    a. 11/2010 NSTEMI/PCI: BMS -->LAD; b. 02/2017 Lateral STEMI/PCI: LM nl, LAD patent stent, LCX 100 (3.0x23 Xience Alpine DES), RCA 86m EF 50%; c. 11/2018 MV: Fixed apical and periapical defect (? over-processing). No significant ischemia. EF 48%.  .Marland KitchenGERD (gastroesophageal reflux disease)   . HFmrEF (heart failure with [place]-range ejection fraction) [place]    a. 04/2019 Echo: EF 45-50%, mild LVH. Mildly dil LA. Small ASA w/ predominantly L->R shunting.  .Marland KitchenHyperlipidemia LDL goal <70   . Hypertension   . Ischemic cardiomyopathy    a. 04/2019 Echo: EF 45-50%.  .Marland KitchenLBBB (left bundle branch block)   . MI ([place]ocardial infarction) ([facility]   . Migraine    Past Surgical History:  Procedure Laterality Date  . CARDIAC CATHETERIZATION N/A 12/28/2014   Procedure: Left Heart Cath and Coronary Angiography;  Surgeon: DYolonda Kida MD;  [facility];  Service: Cardiovascular;  Laterality: N/A;  . CORONARY ANGIOPLASTY     Non-ST elevation MI status post stenting within bare-metal stent of the 75% lesion of the LAD, 11/13/2010.   . CORONARY STENT INTERVENTION N/A 02/02/2017   Procedure: CORONARY/GRAFT ACUTE MI REVASCULARIZATION;  Surgeon: CYolonda Kida MD;  [facility];  Service: Cardiovascular;  Laterality: N/A;  . LEFT HEART CATH AND CORONARY ANGIOGRAPHY  N/A 02/02/2017   Procedure: LEFT HEART CATH AND CORONARY ANGIOGRAPHY;  Surgeon: CYolonda Kida MD;  [facility];  Service: Cardiovascular;  Laterality: N/A;    Allergies  Allergies  Allergen Reactions  . Atorvastatin     Myalgias    History of Present Illness    54year old male with the above complex past medical history including coronary artery disease, hypertension, hyperlipidemia, heart failure with midrange ejection fraction, ischemic cardiomyopathy, and tobacco abuse.  Cardiac history dates back to June 2012, when he suffered a non-STEMI underwent PCI with bare-metal stenting to the LAD.  In September 2018, he suffered a lateral ST segment elevation MI and underwent PCI and drug-eluting stent placement to the mid and distal left circumflex.  Echo at that time showed mild LV dysfunction with EF of 45-50%.  He last underwent stress testing in June 2020 which showed no significant ischemia and a fixed apical and periapical defect.  Most recent echo in November 2020 showed an EF 45 to 50% without significant valvular abnormalities.  He has been followed both here and in heart failure clinic and was switched from oral Lasix to torsemide in January in the setting of ongoing lower extremity swelling.  Since his last visit, he has been fairly stable.  He has noted episodic chest pain, often occurring during sleep, lasting a few secs and resolving spontaneously.  He has also awakened with dyspnea on several occasions.  Despite symptoms at night, he walks in his yard on a daily basis without chest pain or  dyspnea.  He has not been having any edema and feels that the torsemide has worked well.  He does experience cramping, especially in his legs, and especially at night.  He denies claudication, orthopnea, n, v, dizziness, syncope, edema, or early satiety.  Home Medications    Prior to Admission medications   Medication Sig Start Date End Date Taking? Authorizing Provider    aspirin EC 81 MG EC tablet Take 1 tablet (81 mg total) by mouth daily. 02/05/17   Epifanio Lesches, MD  aspirin-acetaminophen-caffeine (EXCEDRIN MIGRAINE) [phone] MG tablet Take 1 tablet by mouth daily as needed for headache. Patient taking differently: Take 2 tablets by mouth daily as needed for headache.  01/06/19   Iloabachie, Chioma E, NP  Aspirin-Acetaminophen-Caffeine [phone] MG PACK Take 1 tablet by mouth every 8 (eight) hours as needed for migraine. Patient takes 2-3 times daily at migraine onset    [provider]  Blood Pressure KIT 1 kit by Does not apply route daily. 04/01/19   Iloabachie, Chioma E, NP  clopidogrel (PLAVIX) 75 MG tablet Take 1 tablet (75 mg total) by mouth daily. 06/17/19   Iloabachie, Chioma E, NP  Docosanol (ABREVA) 10 % CREA Apply 1 application topically 2 (two) times daily. Patient taking differently: Apply 1 application topically 2 (two) times daily as needed.  01/15/18   Dustin Flock, MD  DULoxetine (CYMBALTA) 30 MG capsule Take 1 capsule (30 mg total) by mouth daily for 14 days. 12/09/19 12/23/19  Iloabachie, Chioma E, NP  ezetimibe (ZETIA) 10 MG tablet Take 1 tablet (10 mg total) by mouth daily. 11/03/18 06/06/28  Minna Merritts, MD  gabapentin (NEURONTIN) 100 MG capsule TAKE ONE CAPSULE BY MOUTH 3 TIMES A DAY 12/09/19   Iloabachie, Chioma E, NP  lisinopril (ZESTRIL) 20 MG tablet TAKE ONE TABLET BY MOUTH EVERY DAY 12/09/19   Iloabachie, Chioma E, NP  meloxicam (MOBIC) 15 MG tablet TAKE ONE TABLET BY MOUTH EVERY DAY AS NEEDED FOR PAIN 10/28/19   Iloabachie, Chioma E, NP  metoprolol tartrate (LOPRESSOR) 25 MG tablet TAKE ONE TABLET BY MOUTH 2 TIMES A DAY 06/17/19   Iloabachie, Chioma E, NP  nitroGLYCERIN (NITROSTAT) 0.4 MG SL tablet Place 1 tablet (0.4 mg total) under the tongue every 5 (five) minutes x 3 doses as needed for chest pain. 04/06/19   Dunn, Areta Haber, PA-C  pantoprazole (PROTONIX) 40 MG tablet TAKE ONE TABLET BY MOUTH EVERY DAY 10/28/19    Iloabachie, Chioma E, NP  potassium chloride (KLOR-CON) 10 MEQ tablet Take 2 tablets (20 mEq total) by mouth 2 (two) times daily. 06/07/19   Minna Merritts, MD  rosuvastatin (CRESTOR) 40 MG tablet Take 1 tablet (40 mg total) by mouth daily. 04/07/19 10/17/20  Rise Mu, PA-C  terbinafine (ATHLETES FOOT AF) 1 % cream Apply 1 application topically 2 (two) times daily. 05/26/19   Merlyn Lot, MD  torsemide (DEMADEX) 20 MG tablet Take 1 tablet (20 mg total) by mouth 2 (two) times daily. Patient taking differently: Take 20 mg by mouth. 27m daily and additional 225mPM PRN 06/07/19 10/17/20  GoMinna MerrittsMD    Review of Systems    Ongoing intermittent leg cramping.  Occasional chest pain, mostly brief/fleeting, and occurring at night.  Denies dyspnea, dizziness, syncope, edema, or early satiety.  All other systems reviewed and are otherwise negative except as noted above.  Physical Exam    VS:  BP 120/72 (BP Location: Left Arm, Patient Position: Sitting, Cuff Size: Normal)  Pulse 81   Ht '5\' 8"'  (1.727 m)   Wt 255 lb 4 oz (115.8 kg)   BMI 38.81 kg/m  , BMI Body mass index is 38.81 kg/m. GEN: Well nourished, well developed, in no acute distress. HEENT: normal. Neck: Supple, no JVD, carotid bruits, or masses. Cardiac: RRR, no murmurs, rubs, or gallops. No clubbing, cyanosis, edema.  Radials/PT 2+ and equal bilaterally.  Respiratory:  Respirations regular and unlabored, clear to auscultation bilaterally. GI: Soft, nontender, nondistended, BS + x 4. MS: no deformity or atrophy. Skin: warm and dry, no rash. Neuro:  Strength and sensation are intact. Psych: Normal affect.  Accessory Clinical Findings    ECG personally reviewed by me today - RSR, 81, left axis, inferior infarct - no acute changes.  Lab Results  Component Value Date   WBC 6.4 05/26/2019   HGB 14.0 05/26/2019   HCT 40.2 05/26/2019   MCV 91.0 05/26/2019   PLT 197 05/26/2019   Lab Results  Component Value Date    CREATININE 1.21 05/26/2019   BUN 20 05/26/2019   NA 140 05/26/2019   K 4.1 05/26/2019   CL 104 05/26/2019   CO2 26 05/26/2019   Lab Results  Component Value Date   ALT 29 04/14/2019   AST 18 04/14/2019   ALKPHOS 54 04/14/2019   BILITOT 0.8 04/14/2019   Lab Results  Component Value Date   CHOL 188 04/14/2019   HDL 49 04/14/2019   LDLCALC 87 04/14/2019   LDLDIRECT 107 (H) 04/06/2019   TRIG 261 (H) 04/14/2019   CHOLHDL 3.8 04/14/2019    Lab Results  Component Value Date   HGBA1C 5.8 (H) 01/27/2019    Assessment & Plan    1.  CAD/Chest Pain:  S/p prior LAD and LCX stenting w/ neg MV in 11/2018.  He continues to have intermittent chest discomfort, which is fleeting, generally occurring at night, and resolving spontaneously.  He notes that he sleeps poorly in general.  He has not had any exertional c/p.  We discussed options for eval and given neg MV in 11/2018 w/ similar symptoms, he wishes to forego any additional ischemic testing at this time, which I think is reasonable.  Cont ASA, plavix,  blocker, acei, statin, and zetia.  2.  Chronic HF w/ mid-range EF/ICM: EF 45-50% by echo 04/2019.  Euvolemic on exam.  Cont  blocker, acei, and torsemide.  I will check a bmet and Mg today given leg cramping.  3.  Essential HTN:  Stable on  blocker and acei.  4.  HL:  LDL 107 on crestor and zetia.  We will need to look into pcsk9i therapy.  5.  Daytime somnolence: He notes a long history of sleeping poorly and also daytime somnolence.  I will arrange for pulmonary eval for sleep study.   6.  Tobacco abuse: Still smoking about a pack of cigarettes per week.  Complete cessation advised.  7.  Leg cramping: Patient notes occasional leg cramps, mostly occurring at night.  He denies claudication during the day.  Follow-up basic metabolic panel and magnesium today as he is on torsemide.  Sleep study as above given nocturnal symptoms.  8.  Disposition: Follow-up basic metabolic panel and  magnesium today.  Follow-up in clinic in 3 to 4 months.  Murray Hodgkins, NP 12/16/2019, 1:02 PM

## 2019-12-17 LAB — BASIC METABOLIC PANEL
BUN/Creatinine Ratio: 16 (ref 9–20)
BUN: 15 mg/dL (ref 6–24)
CO2: 23 mmol/L (ref 20–29)
Calcium: 9 mg/dL (ref 8.7–10.2)
Chloride: 104 mmol/L (ref 96–106)
Creatinine, Ser: 0.96 mg/dL (ref 0.76–1.27)
GFR calc Af Amer: 102 mL/min/{1.73_m2} (ref >59)
GFR calc non Af Amer: 89 mL/min/{1.73_m2} (ref >59)
Glucose: 102 mg/dL — ABNORMAL HIGH (ref 65–99)
Potassium: 4.4 mmol/L (ref 3.5–5.2)
Sodium: 139 mmol/L (ref 134–144)

## 2019-12-17 LAB — MAGNESIUM: Magnesium: 2.2 mg/dL (ref 1.6–2.3)

## 2019-12-21 NOTE — BH Specialist Note (Signed)
Note opened in error. HS

## 2019-12-23 ENCOUNTER — Ambulatory Visit: Payer: Medicaid Other | Admitting: Licensed Clinical Social Worker

## 2019-12-24 ENCOUNTER — Institutional Professional Consult (permissible substitution): Payer: Disability Insurance | Admitting: Pulmonary Disease

## 2019-12-24 NOTE — Progress Notes (Deleted)
Patient ID: Bryce [name], male    DOB: 11/03/1965, 54 y.o.   MRN: [id number]  HPI  Mr Bryce [name] is a 54 y/o male with a history of CAD (MI), HTN, GERD, tobacco use and chronic heart failure.   Echo report from 04/26/2019 reviewed and showed an EF of 45-50%. Echo report from 02/03/17 reviewed and showed an EF of 45-50% along with mild inferior hypokinesis.   Catheterization done 02/02/17 which showed:  Mid LAD lesion, 0 %stenosed.  Mid RCA lesion, 40 %stenosed.  A STENT XIENCE ALPINE RX 3.0X23 drug eluting stent was successfully placed, and does not overlap previously placed stent.  Dist Cx lesion, 100 %stenosed.  Post intervention, there is a 0% residual stenosis.  There is mild left ventricular systolic dysfunction.  LV end diastolic pressure is mildly elevated.  The left ventricular ejection fraction is 50-55% by visual estimate. Conclusion Successful PCI and stent to mid to distal circumflex with DES stent 3.023 mm xience Reducing the lesion from 100% down to 0 point TIMI-3 from 0 back to 3.  Has not been admitted or been in the ED in the last 6 months.   He presents today for a follow-up visit with a chief complaint of   Past Medical History:  Diagnosis Date  . ASD (atrial septal defect)    a. 04/2019 Echo: small ASD w/ predominantly L->R shunting.  . Coronary artery disease    a. 11/2010 NSTEMI/PCI: BMS -->LAD; b. 02/2017 Lateral STEMI/PCI: LM nl, LAD patent stent, LCX 100 (3.0x23 Xience Alpine DES), RCA 68m. EF 50%; c. 11/2018 MV: Fixed apical and periapical defect (? over-processing). No significant ischemia. EF 48%.  Marland Kitchen GERD (gastroesophageal reflux disease)   . 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 04/2019 Echo: EF 45-50%, mild LVH. Mildly dil LA. Small ASA w/ predominantly L->R shunting.  Marland Kitchen Hyperlipidemia LDL goal <70   . Hypertension   . Ischemic cardiomyopathy    a. 04/2019 Echo: EF 45-50%.  Marland Kitchen LBBB (left bundle branch block)   . MI  (myocardial infarction) (HCC)   . Migraine    Past Surgical History:  Procedure Laterality Date  . CARDIAC CATHETERIZATION N/A 12/28/2014   Procedure: Left Heart Cath and Coronary Angiography;  Surgeon: Alwyn Pea, MD;  Location: [facility]IVE [facility];  Service: Cardiovascular;  Laterality: N/A;  . CORONARY ANGIOPLASTY     Non-ST elevation MI status post stenting within bare-metal stent of the 75% lesion of the LAD, 11/13/2010.   . CORONARY STENT INTERVENTION N/A 02/02/2017   Procedure: CORONARY/GRAFT ACUTE MI REVASCULARIZATION;  Surgeon: Alwyn Pea, MD;  Location: [facility]VASIVE CV LAB;  Service: Cardiovascular;  Laterality: N/A;  . LEFT HEART CATH AND CORONARY ANGIOGRAPHY N/A 02/02/2017   Procedure: LEFT HEART CATH AND CORONARY ANGIOGRAPHY;  Surgeon: Alwyn Pea, MD;  Location: [facility] INVASIVE CV LAB;  Service: Cardiovascular;  Laterality: N/A;   Family History  Problem Relation Age of Onset  . CAD Father   . Kidney disease Neg Hx   . Diabetes Mellitus II Neg Hx    Social History   Tobacco Use  . Smoking status: Current Some Day Smoker    Packs/day: 1.00    Years: 5.00    Pack years: 5.00    Types: Cigarettes    Last attempt to quit: 01/05/2017    Years since quitting: 2.9  . Smokeless tobacco: Never Used  . Tobacco comment: Typically 1 pack a week but recently  sometimes 2 packs 10/18/19  Substance Use Topics  . Alcohol use: Not Currently    Alcohol/week: 1.0 standard drink    Types: 1 Cans of beer per week    Comment: Social Drinker   Allergies  Allergen Reactions  . Atorvastatin     Myalgias       Review of Systems  Constitutional: Positive for fatigue (tire easily). Negative for appetite change.  HENT: Negative for congestion, postnasal drip and sore throat.   Eyes: Negative.   Respiratory: Positive for shortness of breath. Negative for cough.   Cardiovascular: Positive for leg swelling. Negative for chest pain and palpitations.  Gastrointestinal:  Negative for abdominal distention and abdominal pain.  Endocrine: Negative.   Genitourinary: Negative.   Musculoskeletal: Negative for back pain and neck pain.  Skin: Negative.   Allergic/Immunologic: Negative.   Neurological: Negative for dizziness and light-headedness.  Hematological: Negative for adenopathy. Does not bruise/bleed easily.  Psychiatric/Behavioral: Positive for dysphoric mood. Negative for sleep disturbance (sleeping on 2 pillows).      Physical Exam Vitals and nursing note reviewed.  Constitutional:      Appearance: Normal appearance.  HENT:     Head: Normocephalic and atraumatic.  Cardiovascular:     Rate and Rhythm: Normal rate and regular rhythm.  Pulmonary:     Effort: Pulmonary effort is normal. No respiratory distress.     Breath sounds: No wheezing or rales.  Abdominal:     General: There is no distension.     Palpations: Abdomen is soft.  Musculoskeletal:        General: No tenderness.     Cervical back: Normal range of motion and neck supple.     Right lower leg: No tenderness. Edema (1+ pitting) present.     Left lower leg: No tenderness. Edema (1+ pitting) present.  Skin:    General: Skin is warm and dry.  Neurological:     General: No focal deficit present.     Mental Status: He is alert and oriented to person, place, and time.  Psychiatric:        Mood and Affect: Mood normal.        Behavior: Behavior normal.        Thought Content: Thought content normal.      Assessment & Plan:  1: Chronic heart failure with mildly reduced ejection fraction- - NYHA class III - euvolemic today - weighing daily and he was reminded to call for an overnight weight gain of >2 pounds or a weekly weight gain of >5 pounds - weight 260/2 pounds from last visit here 6 months ago - adds salt "to everything" as he says that he gets "bad cramps"; encouraged him to not add salt to his food and to read food labels for sodium content - discussed taking 20mg   torsemide daily and taking an additional 20mg  in the afternoon for above weight gain, swelling or worsening shortness of breath instead of taking it BID every day; patient says that he will try that - saw cardiology ) 12/16/19 - BNP 03/30/2019 was 10.0 - elevating his legs when sitting for long periods of time - has not gotten compression socks yet and he was instructed to get some and put them on every morning with removal at bedtime  2: HTN- - BP  - saw PCP 12/18/19) 09/23/19 - BMP 12/16/19 reviewed and showed sodium 139, potassium 4.4, creatinine 0.96 and GFR 89  3: Tobacco use- - smokes "some" - complete cessation discussed  for 3 minutes   Patient did not bring his medications nor a list. Each medication was verbally reviewed with the patient and he was encouraged to bring the bottles to every visit to confirm accuracy of list.

## 2019-12-27 ENCOUNTER — Ambulatory Visit: Payer: Medicaid Other | Admitting: Family

## 2019-12-27 ENCOUNTER — Telehealth: Payer: Self-pay | Admitting: Family

## 2019-12-27 NOTE — Telephone Encounter (Signed)
Patient did not show for his [facility] appointment on 12/27/19. Will attempt to reschedule.  

## 2019-12-29 ENCOUNTER — Ambulatory Visit: Payer: Medicaid Other | Admitting: Gerontology

## 2020-01-04 ENCOUNTER — Ambulatory Visit: Payer: Medicaid Other | Admitting: Licensed Clinical Social Worker

## 2020-01-04 ENCOUNTER — Telehealth: Payer: Self-pay | Admitting: Licensed Clinical Social Worker

## 2020-01-04 NOTE — Telephone Encounter (Signed)
Left patient a voicemail with contact information for him to reschedule his missed appointment with Carey Bullocks, LCSW

## 2020-01-13 ENCOUNTER — Other Ambulatory Visit: Payer: Self-pay | Admitting: Gerontology

## 2020-01-13 ENCOUNTER — Other Ambulatory Visit: Payer: Self-pay | Admitting: Cardiovascular Disease

## 2020-01-13 DIAGNOSIS — M79601 Pain in right arm: Secondary | ICD-10-CM

## 2020-01-14 ENCOUNTER — Telehealth: Payer: Self-pay | Admitting: Pharmacy Technician

## 2020-01-14 NOTE — Telephone Encounter (Signed)
Received updated proof of income.  Patient eligible to receive medication assistance at [facility] until time for re-certification in 9359, and as long as eligibility requirements continue to be met.  [facility]

## 2020-01-17 ENCOUNTER — Telehealth: Payer: Self-pay | Admitting: Pharmacist

## 2020-01-17 NOTE — Telephone Encounter (Signed)
01/17/2020 11:12:49 AM - Cymbalta forms to pat & provider  -- Rhetta Mura - Monday, January 17, 2020 11:11 AM --Received pharmacy printout for new med: Cymbalta 30mg  Take one capsule by mouth every day-printed [employer] application--sending provider portion to [facility] for [facility] to sign & mailing patient his portion to sign & return.

## 2020-01-20 ENCOUNTER — Ambulatory Visit: Payer: Medicaid Other | Admitting: Gerontology

## 2020-01-24 ENCOUNTER — Telehealth: Payer: Self-pay

## 2020-01-24 NOTE — Telephone Encounter (Signed)
Tried calling patient to r/s missed appts with Lanora Manis and [place]. Patient did not answer, no voicemail set up.

## 2020-01-27 ENCOUNTER — Telehealth: Payer: Self-pay | Admitting: Pharmacist

## 2020-01-27 NOTE — Telephone Encounter (Signed)
01/27/2020 9:34:14 AM - Cymbalta pending  -- Rhetta Mura - Thursday, January 27, 2020 9:25 AM --I have received the signed portion of [employer] application for Cymbalta, holding for patient to return his portion.

## 2020-02-15 ENCOUNTER — Ambulatory Visit: Payer: Medicaid Other | Admitting: Gerontology

## 2020-02-15 ENCOUNTER — Other Ambulatory Visit: Payer: Self-pay

## 2020-02-15 ENCOUNTER — Encounter: Payer: Self-pay | Admitting: Gerontology

## 2020-02-15 VITALS — BP 146/87 | HR 77 | Ht 68.0 in | Wt 261.0 lb

## 2020-02-15 DIAGNOSIS — I10 Essential (primary) hypertension: Secondary | ICD-10-CM

## 2020-02-15 DIAGNOSIS — I5022 Chronic systolic (congestive) heart failure: Secondary | ICD-10-CM

## 2020-02-15 DIAGNOSIS — M79643 Pain in unspecified hand: Secondary | ICD-10-CM

## 2020-02-15 DIAGNOSIS — G8929 Other chronic pain: Secondary | ICD-10-CM | POA: Insufficient documentation

## 2020-02-15 DIAGNOSIS — Z0000 Encounter for general adult medical examination without abnormal findings: Secondary | ICD-10-CM

## 2020-02-15 MED ORDER — POTASSIUM CHLORIDE CRYS ER 10 MEQ PO TBCR: 20.0000 meq | EXTENDED_RELEASE_TABLET | Freq: Two times a day (BID) | ORAL | 1 refills | Status: DC

## 2020-02-15 MED ORDER — LISINOPRIL 20 MG PO TABS: 20.0000 mg | ORAL_TABLET | Freq: Every day | ORAL | 1 refills | Status: DC

## 2020-02-15 NOTE — Patient Instructions (Signed)
DASH Eating Plan DASH stands for "Dietary Approaches to Stop Hypertension." The DASH eating plan is a healthy eating plan that has been shown to reduce high blood pressure (hypertension). It may also reduce your risk for type 2 diabetes, heart disease, and stroke. The DASH eating plan may also help with weight loss. What are tips for following this plan?  General guidelines  Avoid eating more than 2,300 mg (milligrams) of salt (sodium) a day. If you have hypertension, you may need to reduce your sodium intake to 1,500 mg a day.  Limit alcohol intake to no more than 1 drink a day for nonpregnant women and 2 drinks a day for men. One drink equals 12 oz of beer, 5 oz of wine, or 1 oz of hard liquor.  Work with your health care provider to maintain a healthy body weight or to lose weight. Ask what an ideal weight is for you.  Get at least 30 minutes of exercise that causes your heart to beat faster (aerobic exercise) most days of the week. Activities may include walking, swimming, or biking.  Work with your health care provider or diet and nutrition specialist (dietitian) to adjust your eating plan to your individual calorie needs. Reading food labels   Check food labels for the amount of sodium per serving. Choose foods with less than 5 percent of the Daily Value of sodium. Generally, foods with less than 300 mg of sodium per serving fit into this eating plan.  To find whole grains, look for the word "whole" as the first word in the ingredient list. Shopping  Buy products labeled as "low-sodium" or "no salt added."  Buy fresh foods. Avoid canned foods and premade or frozen meals. Cooking  Avoid adding salt when cooking. Use salt-free seasonings or herbs instead of table salt or sea salt. Check with your health care provider or pharmacist before using salt substitutes.  Do not fry foods. Cook foods using healthy methods such as baking, boiling, grilling, and broiling instead.  Cook with  heart-healthy oils, such as olive, canola, soybean, or sunflower oil. Meal planning  Eat a balanced diet that includes: ? 5 or more servings of fruits and vegetables each day. At each meal, try to fill half of your plate with fruits and vegetables. ? Up to 6-8 servings of whole grains each day. ? Less than 6 oz of lean meat, poultry, or fish each day. A 3-oz serving of meat is about the same size as a deck of cards. One egg equals 1 oz. ? 2 servings of low-fat dairy each day. ? A serving of nuts, seeds, or beans 5 times each week. ? Heart-healthy fats. Healthy fats called Omega-3 fatty acids are found in foods such as flaxseeds and coldwater fish, like sardines, salmon, and mackerel.  Limit how much you eat of the following: ? Canned or prepackaged foods. ? Food that is high in trans fat, such as fried foods. ? Food that is high in saturated fat, such as fatty meat. ? Sweets, desserts, sugary drinks, and other foods with added sugar. ? Full-fat dairy products.  Do not salt foods before eating.  Try to eat at least 2 vegetarian meals each week.  Eat more home-cooked food and less restaurant, buffet, and fast food.  When eating at a restaurant, ask that your food be prepared with less salt or no salt, if possible. What foods are recommended? The items listed may not be a complete list. Talk with your dietitian about   what dietary choices are best for you. Grains Whole-grain or whole-wheat bread. Whole-grain or whole-wheat pasta. Brown rice. Oatmeal. Quinoa. Bulgur. Whole-grain and low-sodium cereals. Pita bread. Low-fat, low-sodium crackers. Whole-wheat flour tortillas. Vegetables Fresh or frozen vegetables (raw, steamed, roasted, or grilled). Low-sodium or reduced-sodium tomato and vegetable juice. Low-sodium or reduced-sodium tomato sauce and tomato paste. Low-sodium or reduced-sodium canned vegetables. Fruits All fresh, dried, or frozen fruit. Canned fruit in natural juice (without  added sugar). Meat and other protein foods Skinless chicken or turkey. Ground chicken or turkey. Pork with fat trimmed off. Fish and seafood. Egg whites. Dried beans, peas, or lentils. Unsalted nuts, nut butters, and seeds. Unsalted canned beans. Lean cuts of beef with fat trimmed off. Low-sodium, lean deli meat. Dairy Low-fat (1%) or fat-free (skim) milk. Fat-free, low-fat, or reduced-fat cheeses. Nonfat, low-sodium ricotta or cottage cheese. Low-fat or nonfat yogurt. Low-fat, low-sodium cheese. Fats and oils Soft margarine without trans fats. Vegetable oil. Low-fat, reduced-fat, or light mayonnaise and salad dressings (reduced-sodium). Canola, safflower, olive, soybean, and sunflower oils. Avocado. Seasoning and other foods Herbs. Spices. Seasoning mixes without salt. Unsalted popcorn and pretzels. Fat-free sweets. What foods are not recommended? The items listed may not be a complete list. Talk with your dietitian about what dietary choices are best for you. Grains Baked goods made with fat, such as croissants, muffins, or some breads. Dry pasta or rice meal packs. Vegetables Creamed or fried vegetables. Vegetables in a cheese sauce. Regular canned vegetables (not low-sodium or reduced-sodium). Regular canned tomato sauce and paste (not low-sodium or reduced-sodium). Regular tomato and vegetable juice (not low-sodium or reduced-sodium). Pickles. Olives. Fruits Canned fruit in a light or heavy syrup. Fried fruit. Fruit in cream or butter sauce. Meat and other protein foods Fatty cuts of meat. Ribs. Fried meat. Bacon. Sausage. Bologna and other processed lunch meats. Salami. Fatback. Hotdogs. Bratwurst. Salted nuts and seeds. Canned beans with added salt. Canned or smoked fish. Whole eggs or egg yolks. Chicken or turkey with skin. Dairy Whole or 2% milk, cream, and half-and-half. Whole or full-fat cream cheese. Whole-fat or sweetened yogurt. Full-fat cheese. Nondairy creamers. Whipped toppings.  Processed cheese and cheese spreads. Fats and oils Butter. Stick margarine. Lard. Shortening. Ghee. Bacon fat. Tropical oils, such as coconut, palm kernel, or palm oil. Seasoning and other foods Salted popcorn and pretzels. Onion salt, garlic salt, seasoned salt, table salt, and sea salt. Worcestershire sauce. Tartar sauce. Barbecue sauce. Teriyaki sauce. Soy sauce, including reduced-sodium. Steak sauce. Canned and packaged gravies. Fish sauce. Oyster sauce. Cocktail sauce. Horseradish that you find on the shelf. Ketchup. Mustard. Meat flavorings and tenderizers. Bouillon cubes. Hot sauce and Tabasco sauce. Premade or packaged marinades. Premade or packaged taco seasonings. Relishes. Regular salad dressings. Where to find more information:  National Heart, Lung, and Blood Institute: www.nhlbi.nih.gov  American Heart Association: www.heart.org Summary  The DASH eating plan is a healthy eating plan that has been shown to reduce high blood pressure (hypertension). It may also reduce your risk for type 2 diabetes, heart disease, and stroke.  With the DASH eating plan, you should limit salt (sodium) intake to 2,300 mg a day. If you have hypertension, you may need to reduce your sodium intake to 1,500 mg a day.  When on the DASH eating plan, aim to eat more fresh fruits and vegetables, whole grains, lean proteins, low-fat dairy, and heart-healthy fats.  Work with your health care provider or diet and nutrition specialist (dietitian) to adjust your eating plan to your   individual calorie needs. This information is not intended to replace advice given to you by your health care provider. Make sure you discuss any questions you have with your health care provider. Document Revised: 05/02/2017 Document Reviewed: 05/13/2016 Elsevier Patient Education  2020 Elsevier Inc.  

## 2020-02-15 NOTE — Progress Notes (Signed)
Established Patient Office Visit  Subjective:  Patient ID: Bryce [name], male    DOB: [birth year]  Age: 54 y.o. MRN: [id number]  CC:  Chief Complaint  Patient presents with  . Hypertension    HPI Bryce [name] presents for follow up of hypertension and medication refill. Bryce [name] states that Bryce [name]'s compliant with his medications and continues to adhere to DASH diet. Bryce [name] was seen by Cardiology Sabra Heck NP on 12/16/2019, Bryce [name] was to continue his medications and sleep study was recommended due to daytime somnolence. Bryce [name] declines sleep study stating that Bryce [name]'s doing better and doesn't need it. Bryce [name] states that Bryce [name] continues to experience constant pain to the joints of his bilateral index and middle fingers that has been going on for more than 5 years. Bryce [name] states that pain to finger joints wakes him up at night, its difficult opening bottle tops and his joints stiffens and sometimes are numb when Bryce [name] wakes up in the morning and massaging the fingers relieves symptoms. Overall, Bryce [name] states that Bryce [name]'s doing well and offers no further complaint.  Past Medical History:  Diagnosis Date  . ASD (atrial septal defect)    a. 04/2019 Echo: small ASD w/ predominantly L->R shunting.  . Coronary artery disease    a. 11/2010 NSTEMI/PCI: BMS -->LAD; b. 02/2017 Lateral STEMI/PCI: LM nl, LAD patent stent, LCX 100 (3.0x23 Xience Alpine DES), RCA 7m EF 50%; c. 11/2018 MV: Fixed apical and periapical defect (? over-processing). No significant ischemia. EF 48%.  .Marland KitchenGERD (gastroesophageal reflux disease)   . HFmrEF (heart failure with [place] ejection fraction) ([place]    a. 04/2019 Echo: EF 45-50%, mild LVH. Mildly dil LA. Small ASA w/ predominantly L->R shunting.  .Marland KitchenHyperlipidemia LDL goal <70   . Hypertension   . Ischemic cardiomyopathy    a. 04/2019 Echo: EF 45-50%.  .Marland KitchenLBBB (left bundle branch block)   . [facility]   . Migraine     Past Surgical History:  Procedure Laterality Date  . CARDIAC  CATHETERIZATION N/A 12/28/2014   Procedure: Left Heart Cath and Coronary Angiography;  Surgeon: DYolonda Kida MD;  [facility];  Service: Cardiovascular;  Laterality: N/A;  . CORONARY ANGIOPLASTY     Non-ST elevation MI status post stenting within bare-metal stent of the 75% lesion of the LAD, 11/13/2010.   . CORONARY STENT INTERVENTION N/A 02/02/2017   Procedure: CORONARY/GRAFT ACUTE MI REVASCULARIZATION;  Surgeon: CYolonda Kida MD;  [facility];  Service: Cardiovascular;  Laterality: N/A;  . LEFT HEART CATH AND CORONARY ANGIOGRAPHY N/A 02/02/2017   Procedure: LEFT HEART CATH AND CORONARY ANGIOGRAPHY;  Surgeon: CYolonda Kida MD;  [facility];  Service: Cardiovascular;  Laterality: N/A;    Family History  Problem Relation Age of Onset  . CAD Father   . Kidney disease Neg Hx   . Diabetes Mellitus II Neg Hx     Social History   Socioeconomic History  . Marital status: Single    Spouse name: Not on file  . Number of children: 2  . Years of education: Not on file  . Highest education level: Not on file  Occupational History  . Occupation: BProofreader   Comment: Currently on disability/unemployed  Tobacco Use  . Smoking status: Current Some Day Smoker    Packs/day: 1.00    Years: 5.00    Pack years: 5.00    Types: Cigarettes  Last attempt to quit: 01/05/2017    Years since quitting: 3.1  . Smokeless tobacco: Never Used  . Tobacco comment: Typically 1 pack a week but recently sometimes 2 packs 10/18/19  Vaping Use  . Vaping Use: Never used  Substance and Sexual Activity  . Alcohol use: Not Currently    Alcohol/week: 1.0 standard drink    Types: 1 Cans of beer per week    Comment: Social Drinker  . Drug use: Yes    Types: Marijuana    Comment: "Every now and then"  . Sexual activity: Yes  Other Topics Concern  . Not on file  Social History Narrative   Social determinants screening completed. HS    Social Determinants of Health   Financial Resource Strain: High Risk  . Difficulty of Paying Living Expenses: Hard  Food Insecurity: No Food Insecurity  . Worried About Charity fundraiser in the Last Year: Never true  . Ran Out of Food in the Last Year: Never true  Transportation Needs: No Transportation Needs  . Lack of Transportation (Medical): No  . Lack of Transportation (Non-Medical): No  Physical Activity: Sufficiently Active  . Days of Exercise per Week: 5 days  . Minutes of Exercise per Session: 40 min  Stress: Stress Concern Present  . Feeling of Stress : To some extent  Social Connections: Moderately Isolated  . Frequency of Communication with Friends and Family: Three times a week  . Frequency of Social Gatherings with Friends and Family: Three times a week  . Attends Religious Services: More than 4 times per year  . Active Member of Clubs or Organizations: No  . Attends Archivist Meetings: Never  . Marital Status: Separated  Intimate Partner Violence: Not At Risk  . Fear of Current or Ex-Partner: No  . Emotionally Abused: No  . Physically Abused: No  . Sexually Abused: No    Outpatient Medications Prior to Visit  Medication Sig Dispense Refill  . aspirin EC 81 MG EC tablet Take 1 tablet (81 mg total) by mouth daily. 30 tablet 0  . aspirin-acetaminophen-caffeine (EXCEDRIN MIGRAINE) 250-250-65 MG tablet Take 1 tablet by mouth daily as needed for headache. (Patient taking differently: Take 2 tablets by mouth daily as needed for headache. ) 30 tablet 0  . clopidogrel (PLAVIX) 75 MG tablet Take 1 tablet (75 mg total) by mouth daily. 90 tablet 1  . Docosanol (ABREVA) 10 % CREA Apply 1 application topically 2 (two) times daily. (Patient taking differently: Apply 1 application topically 2 (two) times daily as needed. ) 1 Tube 0  . DULoxetine (CYMBALTA) 30 MG capsule Take 1 capsule (30 mg total) by mouth daily. (Patient taking differently: Take 30 mg by mouth 2  (two) times daily. ) 30 capsule 1  . ezetimibe (ZETIA) 10 MG tablet TAKE ONE TABLET BY MOUTH EVERY DAY 30 tablet 3  . gabapentin (NEURONTIN) 100 MG capsule TAKE ONE CAPSULE BY MOUTH 3 TIMES A DAY 90 capsule 0  . meloxicam (MOBIC) 15 MG tablet TAKE ONE TABLET BY MOUTH EVERY DAY AS NEEDED FOR PAIN 30 tablet 0  . metoprolol tartrate (LOPRESSOR) 25 MG tablet TAKE ONE TABLET BY MOUTH 2 TIMES A DAY 60 tablet 2  . nitroGLYCERIN (NITROSTAT) 0.4 MG SL tablet Place 1 tablet (0.4 mg total) under the tongue every 5 (five) minutes x 3 doses as needed for chest pain. 25 tablet 1  . pantoprazole (PROTONIX) 40 MG tablet TAKE ONE TABLET BY MOUTH EVERY DAY  90 tablet 0  . rosuvastatin (CRESTOR) 40 MG tablet Take 1 tablet (40 mg total) by mouth daily. 90 tablet 3  . terbinafine (ATHLETES FOOT AF) 1 % cream Apply 1 application topically 2 (two) times daily. 30 g 0  . torsemide (DEMADEX) 20 MG tablet Take 1 tablet (20 mg total) by mouth 2 (two) times daily. (Patient taking differently: Take 20 mg by mouth. 41m daily and additional 237mPM PRN) 180 tablet 1  . lisinopril (ZESTRIL) 20 MG tablet TAKE ONE TABLET BY MOUTH EVERY DAY 90 tablet 0  . potassium chloride (KLOR-CON) 10 MEQ tablet Take 2 tablets (20 mEq total) by mouth 2 (two) times daily. 360 tablet 1  . Aspirin-Acetaminophen-Caffeine 520-260-32.5 MG PACK Take 1 tablet by mouth every 8 (eight) hours as needed for migraine. Patient takes 2-3 times daily at migraine onset    . Blood Pressure KIT 1 kit by Does not apply route daily. 1 kit 0   No facility-administered medications prior to visit.    Allergies  Allergen Reactions  . Atorvastatin     Myalgias    ROS Review of Systems  Constitutional: Negative.   Respiratory: Negative.   Cardiovascular: Negative.   Musculoskeletal: Positive for arthralgias (chronic bilateral hand pain).  Neurological: Negative.   Psychiatric/Behavioral: Negative.       Objective:    Physical Exam HENT:     Head:  Normocephalic and atraumatic.  Cardiovascular:     Rate and Rhythm: Normal rate and regular rhythm.     Pulses: Normal pulses.     Heart sounds: Normal heart sounds.  Pulmonary:     Effort: Pulmonary effort is normal.     Breath sounds: Normal breath sounds.  Musculoskeletal:        General: Normal range of motion.  Skin:    General: Skin is warm.  Neurological:     Mental Status: Bryce [name] is alert and oriented to person, place, and time. Mental status is at baseline.  Psychiatric:        Mood and Affect: Mood normal.        Behavior: Behavior normal.        Thought Content: Thought content normal.        Judgment: Judgment normal.     BP (!) 146/87 (BP Location: Right Arm, Patient Position: Sitting)   Pulse 77   Ht _0  (1.727 m)   Wt 261 lb (118.4 kg)   SpO2 95%   BMI 39.68 kg/m  Wt Readings from Last 3 Encounters:  02/15/20 261 lb (118.4 kg)  12/16/19 255 lb 4 oz (115.8 kg)  09/23/19 259 lb 6.4 oz (117.7 kg)   Bryce [name] gained 6 pounds in 2 months, was advised to continue on his weight loss regimen.  Health Maintenance Due  Topic Date Due  . Hepatitis C Screening  Never done  . TETANUS/TDAP  Never done  . COLONOSCOPY  Never done  . INFLUENZA VACCINE  Never done    There are no preventive care reminders to display for this patient.  Lab Results  Component Value Date   TSH 1.624 12/22/2014   Lab Results  Component Value Date   WBC 6.4 05/26/2019   HGB 14.0 05/26/2019   HCT 40.2 05/26/2019   MCV 91.0 05/26/2019   PLT 197 05/26/2019   Lab Results  Component Value Date   NA 139 12/16/2019   K 4.4 12/16/2019   CO2 23 12/16/2019   GLUCOSE 102 (H) 12/16/2019   BUN  15 12/16/2019   CREATININE 0.96 12/16/2019   BILITOT 0.8 04/14/2019   ALKPHOS 54 04/14/2019   AST 18 04/14/2019   ALT 29 04/14/2019   PROT 7.2 04/14/2019   ALBUMIN 4.1 04/14/2019   CALCIUM 9.0 12/16/2019   ANIONGAP 10 05/26/2019   Lab Results  Component Value Date   CHOL 188 04/14/2019   Lab  Results  Component Value Date   HDL 49 04/14/2019   Lab Results  Component Value Date   LDLCALC 87 04/14/2019   Lab Results  Component Value Date   TRIG 261 (H) 04/14/2019   Lab Results  Component Value Date   CHOLHDL 3.8 04/14/2019   Lab Results  Component Value Date   HGBA1C 5.8 (H) 01/27/2019      Assessment & Plan:   1. Essential hypertension - His blood pressure is not under control and Bryce [name] will continue on current treatment regimen, advised on DASH diet, exercise as tolerated and smoking cessation. His goal blood pressure should be less than 140/90. - lisinopril (ZESTRIL) 20 MG tablet; Take 1 tablet (20 mg total) by mouth daily.  Dispense: 90 tablet; Refill: 1  2. Chronic systolic congestive heart failure ([place]) - Bryce [name] will continue on current treatment regimen, follow up with Ms. Hackney at CHF clinic. - potassium chloride (KLOR-CON) 10 MEQ tablet; Take 2 tablets (20 mEq total) by mouth 2 (two) times daily.  Dispense: 360 tablet; Refill: 1  3. Chronic hand pain, unspecified laterality - Will check inflammatory markers, and will refer accordingly. - C-reactive protein; Future - Sedimentation rate; Future - Rheumatoid Factor; Future  4. Health care maintenance - Routine labs will be checked. - Hepatic function panel; Future - Lipid panel; Future - Urinalysis; Future - HgB A1c; Future     Follow-up: Return in about 22 days (around 03/08/2020), or if symptoms worsen or fail to improve.    Malessa Zartman Jerold Coombe, NP

## 2020-02-17 ENCOUNTER — Telehealth: Payer: Self-pay | Admitting: Pharmacist

## 2020-02-17 NOTE — Telephone Encounter (Signed)
02/17/2020 2:01:20 PM - Cymbalta faxed to [employer]  -- Rhetta Mura - Thursday, February 17, 2020 2:00 PM --Bryce [name] application for Cymbalta 30mg  Take 1 capsule by mouth every day.

## 2020-02-22 ENCOUNTER — Ambulatory Visit: Payer: Medicaid Other | Admitting: Licensed Clinical Social Worker

## 2020-02-22 ENCOUNTER — Other Ambulatory Visit: Payer: Self-pay | Admitting: Gerontology

## 2020-02-22 DIAGNOSIS — R200 Anesthesia of skin: Secondary | ICD-10-CM

## 2020-02-23 ENCOUNTER — Telehealth: Payer: Self-pay | Admitting: Licensed Clinical Social Worker

## 2020-02-23 ENCOUNTER — Other Ambulatory Visit: Payer: Medicaid Other

## 2020-02-23 NOTE — Telephone Encounter (Signed)
Called patient to reschedule appointment with Bryce [name].  Patient also wanted to reschedule lab appointment he missed today and appointment with PCP provider.  Patient missed today's appointment due to getting used to new meds and losing track of the days.

## 2020-02-25 ENCOUNTER — Other Ambulatory Visit: Payer: Self-pay | Admitting: Gerontology

## 2020-02-25 DIAGNOSIS — I213 ST elevation (STEMI) myocardial infarction of unspecified site: Secondary | ICD-10-CM

## 2020-02-29 ENCOUNTER — Encounter: Payer: Self-pay | Admitting: Physician Assistant

## 2020-02-29 ENCOUNTER — Ambulatory Visit (INDEPENDENT_AMBULATORY_CARE_PROVIDER_SITE_OTHER): Payer: Medicaid Other | Admitting: Physician Assistant

## 2020-02-29 ENCOUNTER — Ambulatory Visit: Payer: Medicaid Other | Attending: Family | Admitting: Family

## 2020-02-29 ENCOUNTER — Other Ambulatory Visit: Payer: Self-pay

## 2020-02-29 ENCOUNTER — Emergency Department: Payer: Medicaid Other

## 2020-02-29 ENCOUNTER — Encounter: Payer: Self-pay | Admitting: Family

## 2020-02-29 ENCOUNTER — Inpatient Hospital Stay
Admission: EM | Admit: 2020-02-29 | Discharge: 2020-03-02 | DRG: 287 | Disposition: A | Discharge status: Home / Self Care | Payer: Medicaid Other | Attending: Internal Medicine | Admitting: Internal Medicine

## 2020-02-29 VITALS — BP 140/82 | HR 81 | Ht 68.0 in | Wt 258.0 lb

## 2020-02-29 VITALS — BP 138/90 | HR 82 | Resp 20 | Ht 68.0 in | Wt 258.1 lb

## 2020-02-29 DIAGNOSIS — E785 Hyperlipidemia, unspecified: Secondary | ICD-10-CM | POA: Diagnosis present

## 2020-02-29 DIAGNOSIS — Z720 Tobacco use: Secondary | ICD-10-CM

## 2020-02-29 DIAGNOSIS — Q211 Atrial septal defect: Secondary | ICD-10-CM | POA: Diagnosis not present

## 2020-02-29 DIAGNOSIS — Z7901 Long term (current) use of anticoagulants: Secondary | ICD-10-CM | POA: Diagnosis not present

## 2020-02-29 DIAGNOSIS — G629 Polyneuropathy, unspecified: Secondary | ICD-10-CM | POA: Diagnosis present

## 2020-02-29 DIAGNOSIS — I252 Old myocardial infarction: Secondary | ICD-10-CM | POA: Insufficient documentation

## 2020-02-29 DIAGNOSIS — K219 Gastro-esophageal reflux disease without esophagitis: Secondary | ICD-10-CM | POA: Insufficient documentation

## 2020-02-29 DIAGNOSIS — Z7902 Long term (current) use of antithrombotics/antiplatelets: Secondary | ICD-10-CM | POA: Insufficient documentation

## 2020-02-29 DIAGNOSIS — I110 Hypertensive heart disease with heart failure: Secondary | ICD-10-CM | POA: Insufficient documentation

## 2020-02-29 DIAGNOSIS — Z8249 Family history of ischemic heart disease and other diseases of the circulatory system: Secondary | ICD-10-CM | POA: Diagnosis not present

## 2020-02-29 DIAGNOSIS — I25110 Atherosclerotic heart disease of native coronary artery with unstable angina pectoris: Secondary | ICD-10-CM | POA: Diagnosis not present

## 2020-02-29 DIAGNOSIS — I2510 Atherosclerotic heart disease of native coronary artery without angina pectoris: Secondary | ICD-10-CM | POA: Insufficient documentation

## 2020-02-29 DIAGNOSIS — I10 Essential (primary) hypertension: Secondary | ICD-10-CM

## 2020-02-29 DIAGNOSIS — Z955 Presence of coronary angioplasty implant and graft: Secondary | ICD-10-CM | POA: Diagnosis not present

## 2020-02-29 DIAGNOSIS — I5020 Unspecified systolic (congestive) heart failure: Secondary | ICD-10-CM

## 2020-02-29 DIAGNOSIS — Z791 Long term (current) use of non-steroidal anti-inflammatories (NSAID): Secondary | ICD-10-CM | POA: Diagnosis not present

## 2020-02-29 DIAGNOSIS — F17210 Nicotine dependence, cigarettes, uncomplicated: Secondary | ICD-10-CM | POA: Insufficient documentation

## 2020-02-29 DIAGNOSIS — I447 Left bundle-branch block, unspecified: Secondary | ICD-10-CM | POA: Diagnosis present

## 2020-02-29 DIAGNOSIS — Z7982 Long term (current) use of aspirin: Secondary | ICD-10-CM | POA: Diagnosis not present

## 2020-02-29 DIAGNOSIS — I255 Ischemic cardiomyopathy: Secondary | ICD-10-CM | POA: Insufficient documentation

## 2020-02-29 DIAGNOSIS — R079 Chest pain, unspecified: Secondary | ICD-10-CM | POA: Insufficient documentation

## 2020-02-29 DIAGNOSIS — I200 Unstable angina: Secondary | ICD-10-CM | POA: Diagnosis present

## 2020-02-29 DIAGNOSIS — I5022 Chronic systolic (congestive) heart failure: Secondary | ICD-10-CM | POA: Diagnosis not present

## 2020-02-29 DIAGNOSIS — R5383 Other fatigue: Secondary | ICD-10-CM | POA: Diagnosis not present

## 2020-02-29 DIAGNOSIS — Z7289 Other problems related to lifestyle: Secondary | ICD-10-CM | POA: Diagnosis not present

## 2020-02-29 DIAGNOSIS — Z79899 Other long term (current) drug therapy: Secondary | ICD-10-CM

## 2020-02-29 DIAGNOSIS — I480 Paroxysmal atrial fibrillation: Secondary | ICD-10-CM | POA: Diagnosis present

## 2020-02-29 DIAGNOSIS — Z6839 Body mass index (BMI) 39.0-39.9, adult: Secondary | ICD-10-CM

## 2020-02-29 DIAGNOSIS — Z20822 Contact with and (suspected) exposure to covid-19: Secondary | ICD-10-CM | POA: Diagnosis present

## 2020-02-29 LAB — CBC WITH DIFFERENTIAL/PLATELET
Abs Immature Granulocytes: 0.02 10*3/uL (ref 0.00–0.07)
Basophils Absolute: 0 10*3/uL (ref 0.0–0.1)
Basophils Relative: 1 %
Eosinophils Absolute: 0.1 10*3/uL (ref 0.0–0.5)
Eosinophils Relative: 2 %
HCT: 43.6 % (ref 39.0–52.0)
Hemoglobin: 14.9 g/dL (ref 13.0–17.0)
Immature Granulocytes: 0 %
Lymphocytes Relative: 29 %
Lymphs Abs: 1.8 10*3/uL (ref 0.7–4.0)
MCH: 32.1 pg (ref 26.0–34.0)
MCHC: 34.2 g/dL (ref 30.0–36.0)
MCV: 94 fL (ref 80.0–100.0)
Monocytes Absolute: 0.5 10*3/uL (ref 0.1–1.0)
Monocytes Relative: 9 %
Neutro Abs: 3.7 10*3/uL (ref 1.7–7.7)
Neutrophils Relative %: 59 %
Platelets: 212 10*3/uL (ref 150–400)
RBC: 4.64 MIL/uL (ref 4.22–5.81)
RDW: 13.8 % (ref 11.5–15.5)
WBC: 6.2 10*3/uL (ref 4.0–10.5)
nRBC: 0 % (ref 0.0–0.2)

## 2020-02-29 LAB — COMPREHENSIVE METABOLIC PANEL
ALT: 26 U/L (ref 0–44)
AST: 23 U/L (ref 15–41)
Albumin: 4.6 g/dL (ref 3.5–5.0)
Alkaline Phosphatase: 58 U/L (ref 38–126)
Anion gap: 9 (ref 5–15)
BUN: 13 mg/dL (ref 6–20)
CO2: 24 mmol/L (ref 22–32)
Calcium: 9.1 mg/dL (ref 8.9–10.3)
Chloride: 106 mmol/L (ref 98–111)
Creatinine, Ser: 0.94 mg/dL (ref 0.61–1.24)
GFR calc Af Amer: >60 mL/min (ref >60)
GFR calc non Af Amer: >60 mL/min (ref >60)
Glucose, Bld: 147 mg/dL — ABNORMAL HIGH (ref 70–99)
Potassium: 3.9 mmol/L (ref 3.5–5.1)
Sodium: 139 mmol/L (ref 135–145)
Total Bilirubin: 0.8 mg/dL (ref 0.3–1.2)
Total Protein: 7.3 g/dL (ref 6.5–8.1)

## 2020-02-29 LAB — TROPONIN I (HIGH SENSITIVITY): Troponin I (High Sensitivity): 6 ng/L (ref <18)

## 2020-02-29 LAB — RESPIRATORY PANEL BY RT PCR (FLU A&B, COVID)
Influenza A by PCR: NEGATIVE
Influenza B by PCR: NEGATIVE
SARS Coronavirus 2 by RT PCR: NEGATIVE

## 2020-02-29 LAB — BRAIN NATRIURETIC PEPTIDE: B Natriuretic Peptide: 20.2 pg/mL (ref 0.0–100.0)

## 2020-02-29 MED ORDER — SODIUM CHLORIDE 0.9 % IV SOLN: INTRAVENOUS | Status: DC

## 2020-02-29 MED ORDER — POTASSIUM CHLORIDE CRYS ER 20 MEQ PO TBCR
20.0000 meq | EXTENDED_RELEASE_TABLET | Freq: Two times a day (BID) | ORAL | Status: DC
  Administered 2020-03-01 – 2020-03-02 (×4): 20 meq via ORAL
  Filled 2020-02-29 (×4): qty 1

## 2020-02-29 MED ORDER — GABAPENTIN 100 MG PO CAPS
100.0000 mg | ORAL_CAPSULE | Freq: Three times a day (TID) | ORAL | Status: DC
  Administered 2020-03-01 (×4): 100 mg via ORAL
  Filled 2020-02-29 (×6): qty 1

## 2020-02-29 MED ORDER — MORPHINE SULFATE (PF) 2 MG/ML IV SOLN: 2.0000 mg | INTRAVENOUS | Status: DC | PRN

## 2020-02-29 MED ORDER — METOPROLOL TARTRATE 25 MG PO TABS
25.0000 mg | ORAL_TABLET | Freq: Two times a day (BID) | ORAL | Status: DC
  Administered 2020-03-01 – 2020-03-02 (×2): 25 mg via ORAL
  Filled 2020-02-29 (×4): qty 1

## 2020-02-29 MED ORDER — DULOXETINE HCL 30 MG PO CPEP
30.0000 mg | ORAL_CAPSULE | Freq: Two times a day (BID) | ORAL | Status: DC
  Administered 2020-03-01 – 2020-03-02 (×4): 30 mg via ORAL
  Filled 2020-02-29 (×5): qty 1

## 2020-02-29 MED ORDER — LISINOPRIL 20 MG PO TABS
20.0000 mg | ORAL_TABLET | Freq: Every day | ORAL | Status: DC
  Administered 2020-03-01 (×2): 20 mg via ORAL
  Filled 2020-02-29 (×2): qty 2

## 2020-02-29 MED ORDER — CLOPIDOGREL BISULFATE 75 MG PO TABS
75.0000 mg | ORAL_TABLET | Freq: Every day | ORAL | Status: DC
  Administered 2020-03-01 – 2020-03-02 (×3): 75 mg via ORAL
  Filled 2020-02-29 (×3): qty 1

## 2020-02-29 MED ORDER — ALUM & MAG HYDROXIDE-SIMETH 200-200-20 MG/5ML PO SUSP
30.0000 mL | Freq: Once | ORAL | Status: AC
  Administered 2020-03-01: 30 mL via ORAL
  Filled 2020-02-29: qty 30

## 2020-02-29 MED ORDER — NITROGLYCERIN 0.4 MG SL SUBL: 0.4000 mg | SUBLINGUAL_TABLET | SUBLINGUAL | Status: DC | PRN

## 2020-02-29 MED ORDER — ACETAMINOPHEN 325 MG PO TABS
650.0000 mg | ORAL_TABLET | ORAL | Status: DC | PRN
  Administered 2020-03-01: 650 mg via ORAL
  Filled 2020-02-29: qty 2

## 2020-02-29 MED ORDER — ONDANSETRON HCL 4 MG/2ML IJ SOLN: 4.0000 mg | Freq: Four times a day (QID) | INTRAMUSCULAR | Status: DC | PRN

## 2020-02-29 MED ORDER — ENOXAPARIN SODIUM 40 MG/0.4ML ~~LOC~~ SOLN
40.0000 mg | SUBCUTANEOUS | Status: DC
  Administered 2020-03-01: 40 mg via SUBCUTANEOUS
  Filled 2020-02-29: qty 0.4

## 2020-02-29 MED ORDER — LIDOCAINE VISCOUS HCL 2 % MT SOLN
15.0000 mL | Freq: Once | OROMUCOSAL | Status: AC
  Administered 2020-03-01: 15 mL via ORAL
  Filled 2020-02-29: qty 15

## 2020-02-29 MED ORDER — ASPIRIN 325 MG PO TABS
325.0000 mg | ORAL_TABLET | Freq: Once | ORAL | Status: AC
  Administered 2020-02-29: 325 mg via ORAL

## 2020-02-29 MED ORDER — ASPIRIN EC 81 MG PO TBEC
81.0000 mg | DELAYED_RELEASE_TABLET | Freq: Every day | ORAL | Status: DC
  Administered 2020-03-01 – 2020-03-02 (×3): 81 mg via ORAL
  Filled 2020-02-29 (×3): qty 1

## 2020-02-29 MED ORDER — ALPRAZOLAM 0.25 MG PO TABS: 0.2500 mg | ORAL_TABLET | Freq: Two times a day (BID) | ORAL | Status: DC | PRN

## 2020-02-29 MED ORDER — EZETIMIBE 10 MG PO TABS
10.0000 mg | ORAL_TABLET | Freq: Every day | ORAL | Status: DC
  Administered 2020-03-01: 10 mg via ORAL
  Filled 2020-02-29 (×2): qty 1

## 2020-02-29 MED ORDER — TERBINAFINE HCL 1 % EX CREA
1.0000 "application " | TOPICAL_CREAM | Freq: Two times a day (BID) | CUTANEOUS | Status: DC
  Filled 2020-02-29: qty 12

## 2020-02-29 MED ORDER — ROSUVASTATIN CALCIUM 10 MG PO TABS
40.0000 mg | ORAL_TABLET | Freq: Every day | ORAL | Status: DC
  Administered 2020-03-01: 40 mg via ORAL
  Filled 2020-02-29: qty 4
  Filled 2020-02-29: qty 2

## 2020-02-29 MED ORDER — ZOLPIDEM TARTRATE 5 MG PO TABS
5.0000 mg | ORAL_TABLET | Freq: Every evening | ORAL | Status: DC | PRN
  Administered 2020-03-01: 5 mg via ORAL
  Filled 2020-02-29: qty 1

## 2020-02-29 MED ORDER — ASPIRIN 81 MG PO TBEC: 81.0000 mg | DELAYED_RELEASE_TABLET | Freq: Every day | ORAL | Status: DC

## 2020-02-29 MED ORDER — TORSEMIDE 20 MG PO TABS
20.0000 mg | ORAL_TABLET | Freq: Every day | ORAL | Status: DC
  Administered 2020-03-01 (×2): 20 mg via ORAL
  Filled 2020-02-29 (×2): qty 1

## 2020-02-29 MED ORDER — PANTOPRAZOLE SODIUM 40 MG PO TBEC
40.0000 mg | DELAYED_RELEASE_TABLET | Freq: Every day | ORAL | Status: DC
  Administered 2020-03-01 – 2020-03-02 (×3): 40 mg via ORAL
  Filled 2020-02-29 (×3): qty 1

## 2020-02-29 NOTE — Patient Instructions (Signed)
Medication Instructions:  You have been given 324mg  of aspirin today.  Your physician recommends that you continue on your current medications as directed. Please refer to the Current Medication list given to you today.  *If you need a refill on your cardiac medications before your next appointment, please call your pharmacy*   Lab Work: None today  If you have labs (blood work) drawn today and your tests are completely normal, you will receive your results only by: MyChart Message (if you have MyChart) OR . A paper copy in the mail If you have any lab test that is abnormal or we need to change your treatment, we will call you to review the results.   Testing/Procedures: None ordered  Follow-Up: At [facility], you and your health needs are our priority.  As part of our continuing mission to provide you with exceptional heart care, we have created designated Provider Care Teams.  These Care Teams include your primary Cardiologist (physician) and Advanced Practice Providers (APPs -  Physician Assistants and Nurse Practitioners) who all work together to provide you with the care you need, when you need it.  We recommend signing up for the patient portal called "MyChart".  Sign up information is provided on this After Visit Summary.  MyChart is used to connect with patients for Virtual Visits (Telemedicine).  Patients are able to view lab/test results, encounter notes, upcoming appointments, etc.  Non-urgent messages can be sent to your provider as well.   To learn more about what you can do with MyChart, go to [facility].    Other Instructions Patient being taken to ED for evaluation.

## 2020-02-29 NOTE — ED Triage Notes (Signed)
Pt comes via POV from Cardiologist with c/o unstable angina. Pt states this CP started Wednesday. Pt states mid sterna lCP that radiates to left arm. Pt states pressure than like a lightening bolt. Pt states he took 2 nitro that day and it helped.   Pt states he was sent here for admission and heart cath.  Pt states hx of stents in past. Pt states 2/10 pain.

## 2020-02-29 NOTE — ED Provider Notes (Signed)
[facility] Emergency Department Provider Note   ____________________________________________    I have reviewed the triage vital signs and the nursing notes.   HISTORY  Chief Complaint Chest Pain     HPI Bryce [name] is a 54 y.o. male with a long history of coronary artery disease who was sent in by his cardiologist today because of increasing frequency of chest pain.  Patient reports over the last several weeks he has been having intermittent chest pain, sometimes at night, sometimes with exertion.  Saw his cardiologist today who sent him to the emergency department for admission with plan for catheterization in the morning.  Patient currently feels well, has no chest pain.  No shortness of breath.  No nausea vomiting or diaphoresis.  Did receive aspirin at his cardiologist office  Past Medical History:  Diagnosis Date  . ASD (atrial septal defect)    a. 04/2019 Echo: small ASD w/ predominantly L->R shunting.  . Coronary artery disease    a. 11/2010 NSTEMI/PCI: BMS -->LAD; b. 02/2017 Lateral STEMI/PCI: LM nl, LAD patent stent, LCX 100 (3.0x23 Xience Alpine DES), RCA 64m EF 50%; c. 11/2018 MV: Fixed apical and periapical defect (? over-processing). No significant ischemia. EF 48%.  .Marland KitchenGERD (gastroesophageal reflux disease)   . 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 04/2019 Echo: EF 45-50%, mild LVH. Mildly dil LA. Small ASA w/ predominantly L->R shunting.  .Marland KitchenHyperlipidemia LDL goal <70   . Hypertension   . Ischemic cardiomyopathy    a. 04/2019 Echo: EF 45-50%.  .Marland KitchenLBBB (left bundle branch block)   . MI (myocardial infarction) ([place]   . Migraine     Patient Active Problem List   Diagnosis Date Noted  . Chronic hand pain 02/15/2020  . Dysphoric mood 06/17/2019  . Congestive heart failure (CHF) ([place] 04/01/2019  . Vitamin B12 deficiency 04/01/2019  . Right arm pain 04/01/2019  . Bilateral lower extremity edema  04/01/2019  . Prediabetes 01/28/2019  . Erectile dysfunction 01/06/2019  . Numbness 01/06/2019  . History of migraine headaches 01/06/2019  . Atherosclerosis of native coronary artery of native heart with stable angina pectoris ([place] 11/02/2018  . Acute renal failure (ARF) ([place] 01/13/2018  . CAD (coronary artery disease) 02/20/2017  . Dehydration 02/20/2017  . STEMI (ST elevation myocardial infarction) ([place] 02/02/2017  . AKI (acute kidney injury) ([place] 12/15/2015  . Solitary pulmonary nodule 12/23/2014  . Chest pain 12/22/2014  . Hypertension 12/22/2014  . Hyperlipidemia 12/22/2014    Past Surgical History:  Procedure Laterality Date  . CARDIAC CATHETERIZATION N/A 12/28/2014   Procedure: Left Heart Cath and Coronary Angiography;  Surgeon: DYolonda Kida MD;  [facility];  Service: Cardiovascular;  Laterality: N/A;  . CORONARY ANGIOPLASTY     Non-ST elevation MI status post stenting within bare-metal stent of the 75% lesion of the LAD, 11/13/2010.   . CORONARY STENT INTERVENTION N/A 02/02/2017   Procedure: CORONARY/GRAFT ACUTE MI REVASCULARIZATION;  Surgeon: CYolonda Kida MD;  [place] [facility];  Service: Cardiovascular;  Laterality: N/A;  . LEFT HEART CATH AND CORONARY ANGIOGRAPHY N/A 02/02/2017   Procedure: LEFT HEART CATH AND CORONARY ANGIOGRAPHY;  Surgeon: CYolonda Kida MD;  [facility];  Service: Cardiovascular;  Laterality: N/A;    Prior to Admission medications   Medication Sig Start Date End Date Taking? Authorizing Provider  aspirin EC 81 MG EC tablet Take 1 tablet (81  mg total) by mouth daily. 02/05/17   Epifanio Lesches, MD  aspirin-acetaminophen-caffeine (EXCEDRIN MIGRAINE) [phone] MG tablet Take 1 tablet by mouth daily as needed for headache. Patient taking differently: Take 2 tablets by mouth daily as needed for headache.  01/06/19   Iloabachie, Chioma E, NP  Aspirin-Acetaminophen-Caffeine [phone] MG  PACK Take 1 tablet by mouth every 8 (eight) hours as needed for migraine. Patient takes 2-3 times daily at migraine onset    [provider]  Blood Pressure KIT 1 kit by Does not apply route daily. 04/01/19   Iloabachie, Chioma E, NP  clopidogrel (PLAVIX) 75 MG tablet TAKE ONE TABLET BY MOUTH EVERY DAY 02/29/20   Iloabachie, Chioma E, NP  Docosanol (ABREVA) 10 % CREA Apply 1 application topically 2 (two) times daily. Patient taking differently: Apply 1 application topically 2 (two) times daily as needed.  01/15/18   Dustin Flock, MD  DULoxetine (CYMBALTA) 30 MG capsule Take 1 capsule (30 mg total) by mouth daily. Patient taking differently: Take 30 mg by mouth 2 (two) times daily.  12/16/19 02/29/20  Iloabachie, Chioma E, NP  ezetimibe (ZETIA) 10 MG tablet TAKE ONE TABLET BY MOUTH EVERY DAY 01/13/20   Minna Merritts, MD  gabapentin (NEURONTIN) 100 MG capsule TAKE ONE CAPSULE BY MOUTH 3 TIMES A DAY 02/22/20   Iloabachie, Chioma E, NP  lisinopril (ZESTRIL) 20 MG tablet Take 1 tablet (20 mg total) by mouth daily. 02/15/20   Iloabachie, Chioma E, NP  meloxicam (MOBIC) 15 MG tablet TAKE ONE TABLET BY MOUTH EVERY DAY AS NEEDED FOR PAIN 10/28/19   Iloabachie, Chioma E, NP  metoprolol tartrate (LOPRESSOR) 25 MG tablet TAKE ONE TABLET BY MOUTH 2 TIMES A DAY 06/17/19   Iloabachie, Chioma E, NP  nitroGLYCERIN (NITROSTAT) 0.4 MG SL tablet Place 1 tablet (0.4 mg total) under the tongue every 5 (five) minutes x 3 doses as needed for chest pain. 04/06/19   Dunn, Areta Haber, PA-C  pantoprazole (PROTONIX) 40 MG tablet TAKE ONE TABLET BY MOUTH EVERY DAY 10/28/19   Iloabachie, Chioma E, NP  potassium chloride (KLOR-CON) 10 MEQ tablet Take 2 tablets (20 mEq total) by mouth 2 (two) times daily. 02/15/20   Iloabachie, Chioma E, NP  rosuvastatin (CRESTOR) 40 MG tablet Take 1 tablet (40 mg total) by mouth daily. 04/07/19 10/17/20  Rise Mu, PA-C  terbinafine (ATHLETES FOOT AF) 1 % cream Apply 1 application topically 2  (two) times daily. 05/26/19   Merlyn Lot, MD  torsemide (DEMADEX) 20 MG tablet Take 1 tablet (20 mg total) by mouth 2 (two) times daily. Patient taking differently: Take 20 mg by mouth. 15m daily and additional 271mPM PRN 06/07/19 10/17/20  GoMinna MerrittsMD     Allergies Atorvastatin  Family History  Problem Relation Age of Onset  . CAD Father   . Kidney disease Neg Hx   . Diabetes Mellitus II Neg Hx     Social History Social History   Tobacco Use  . Smoking status: Current Some Day Smoker    Packs/day: 1.00    Years: 5.00    Pack years: 5.00    Types: Cigarettes    Last attempt to quit: 01/05/2017    Years since quitting: 3.1  . Smokeless tobacco: Never Used  . Tobacco comment: Typically 1 pack a week but recently sometimes 2 packs 10/18/19  Vaping Use  . Vaping Use: Never used  Substance Use Topics  . Alcohol use: Not Currently  Alcohol/week: 1.0 standard drink    Types: 1 Cans of beer per week    Comment: Social Drinker  . Drug use: Yes    Types: Marijuana    Comment: everyday    Review of Systems  Constitutional: No fever/chills Eyes: No visual changes.  ENT: No sore throat. Cardiovascular: As above Respiratory: Denies shortness of breath. Gastrointestinal: No abdominal pain.   Genitourinary: Negative for dysuria. Musculoskeletal: Negative for back pain. Skin: Negative for rash. Neurological: Negative for headaches   ____________________________________________   PHYSICAL EXAM:  VITAL SIGNS: ED Triage Vitals [02/29/20 1624]  Enc Vitals Group     BP (!) 159/96     Pulse Rate 75     Resp 19     Temp 98.6 F (37 C)     Temp src      SpO2 96 %     Weight 117 kg (258 lb)     Height 1.727 m ('5\' 8"' )     Head Circumference      Peak Flow      Pain Score 2     Pain Loc      Pain Edu?      Excl. in [place]?     Constitutional: Alert and oriented. No acute distress. Pleasant and interactive  Nose: No congestion/rhinnorhea. Mouth/Throat:  Mucous membranes are moist.    Cardiovascular: Normal rate, regular rhythm. Grossly normal heart sounds.  Good peripheral circulation. Respiratory: Normal respiratory effort.  No retractions. Lungs CTAB. Gastrointestinal: Soft and nontender. No distention.    Musculoskeletal: No lower extremity tenderness nor edema.  Warm and well perfused Neurologic:  Normal speech and language. No gross focal neurologic deficits are appreciated.  Skin:  Skin is warm, dry and intact. No rash noted. Psychiatric: Mood and affect are normal. Speech and behavior are normal.  ____________________________________________   LABS (all labs ordered are listed, but only abnormal results are displayed)  Labs Reviewed  COMPREHENSIVE METABOLIC PANEL - Abnormal; Notable for the following components:      Result Value   Glucose, Bld 147 (*)    All other components within normal limits  BRAIN NATRIURETIC PEPTIDE  CBC WITH DIFFERENTIAL/PLATELET  TROPONIN I (HIGH SENSITIVITY)  TROPONIN I (HIGH SENSITIVITY)   ____________________________________________  EKG  ED ECG REPORT I, Lavonia Drafts, the attending physician, personally viewed and interpreted this ECG.  Date: 02/29/2020  Rhythm: normal sinus rhythm QRS Axis: Left axis deviation Intervals: Left bundle branch block ST/T Wave abnormalities: normal Narrative Interpretation: no evidence of acute ischemia  ____________________________________________  RADIOLOGY  Chest x-ray reviewed by me, no infiltrate effusion or pneumothorax ____________________________________________   PROCEDURES  Procedure(s) performed: No  Procedures   Critical Care performed: No ____________________________________________   INITIAL IMPRESSION / ASSESSMENT AND PLAN / ED COURSE  Pertinent labs & imaging results that were available during my care of the patient were reviewed by me and considered in my medical decision making (see chart for details).  Patient here  for increasing frequency of angina, concerning for unstable angina versus ACS in the setting of known significant CAD.  He is chest pain-free currently.  Reportedly had chest discomfort while at cardiologist office today.    Initial troponin is reassuring.  EKG demonstrates left bundle which is old for him.  Chest x-ray reviewed by me is unremarkable.  Reviewed patient's past medical history, last heart catheterization was in 2018  We will consult the hospitalist service for admission    ____________________________________________   FINAL CLINICAL IMPRESSION(S) /  ED DIAGNOSES  Final diagnoses:  Chest pain, unspecified type        Note:  This document was prepared using Dragon voice recognition software and may include unintentional dictation errors.   Lavonia Drafts, MD 02/29/20 2023

## 2020-02-29 NOTE — ED Notes (Signed)
Pt given meal tray at this time 

## 2020-02-29 NOTE — Progress Notes (Signed)
Cardiology Office Note    Date:  02/29/2020   ID:  Cord, Wilczynski 1965-09-24, MRN [id number]  PCP:  Langston Reusing, NP  Cardiologist:  Ida Rogue, MD  Electrophysiologist:  None   Chief Complaint: Chest pain  History of Present Illness:   Bryce [name] is a 54 y.o. male with history of CAD status post prior LAD and LCx stenting, HFrEF secondary to ICM, HTN, HLD, and tobacco use who presents for evaluation of chest pain at the request of the [facility] CHF clinic.  His cardiac history dates back to 11/2010 when he suffered an NSTEMI and underwent PCI/BMS to the LAD.  In 02/2017 he suffered a lateral ST segment elevation MI and underwent PCI/DES to the mid and distal LCx.  Echo at that time showed mild LV dysfunction with an EF of 45 to 50%.  Nuclear stress testing in 11/2018 showed no significant ischemia with a fixed apical anterior apical defect.  Echo from 04/2019 showed an EF of 45 to 50% without significant valvular abnormalities.  He has been followed in our office as well as the [facility] CHF clinic.  In early 2021 his Lasix was transitioned to torsemide in the setting of ongoing lower extremity swelling.  He was last seen in this office in 12/2019 and was doing reasonably well.  He noted episodic chest pain that often occurred during sleep and would last for several seconds with spontaneous resolution.  He also noted awakening with dyspnea on several occasions.  Despite these nocturnal symptoms he continued to walk in his yard on a daily basis without anginal symptoms.  He felt like torsemide was working well.  Weight at that visit was noted to be 255 pounds which was down 5 pounds when compared to his visit in 06/2019.  Given lack of exertional anginal symptoms further ischemic testing was deferred at that time.  With a longstanding history of poor sleep he was referred to pulmonology for evaluation of sleep study.  He was seen in the [facility] CHF clinic earlier today for  follow-up and reported severe substernal chest pain that occurred on 9/22.  In this setting, he was scheduled an appointment to see me this afternoon. EKG and labs were not obtained at that time.  Patient comes in today noting development of severe substernal chest pain on the evening of 9/22 waking him from sleep that was rated a 5 out of 10 and lasted for several hours in duration. He took 2 sublingual nitroglycerin with minimal symptom improvement. During this episode he would have sporadic increases in chest pain that would be rated an 8 out of 10. There was some associated dyspnea, fatigue, and diaphoresis. Pain was not as severe as his prior STEMI. Outside of ongoing fatigue that had been present prior to the above episode and continues to persist he has been symptom-free since. However, upon walking the lengthy hallway in the medical arts building this afternoon he did note some increased fatigue, dyspnea, and mild left-sided chest pain. He denies any lower extremity swelling, abdominal distention, orthopnea, PND, early satiety. No presyncope or syncope. He has been out of his lisinopril and metoprolol though otherwise has been compliant with all cardiac medications.   Labs independently reviewed: 12/2019 - magnesium 2.2, potassium 4.4, BUN 15, serum creatinine 0.96 05/2019 - Hgb 14.0, PLT 197 04/2019 - TC 188, TG 261, HDL 49, LDL 87  Past Medical History:  Diagnosis Date  . ASD (atrial septal defect)  a. 04/2019 Echo: small ASD w/ predominantly L->R shunting.  . Coronary artery disease    a. 11/2010 NSTEMI/PCI: BMS -->LAD; b. 02/2017 Lateral STEMI/PCI: LM nl, LAD patent stent, LCX 100 (3.0x23 Xience Alpine DES), RCA 46m EF 50%; c. 11/2018 MV: Fixed apical and periapical defect (? over-processing). No significant ischemia. EF 48%.  .Marland KitchenGERD (gastroesophageal reflux disease)   . HFmrEF (heart failure with mid-range ejection fraction) (HIla    a. 04/2019 Echo: EF 45-50%, mild LVH. Mildly dil LA.  Small ASA w/ predominantly L->R shunting.  .Marland KitchenHyperlipidemia LDL goal <70   . Hypertension   . Ischemic cardiomyopathy    a. 04/2019 Echo: EF 45-50%.  .Marland KitchenLBBB (left bundle branch block)   . MI (myocardial infarction) ([facility]ix   . Migraine     Past Surgical History:  Procedure Laterality Date  . CARDIAC CATHETERIZATION N/A 12/28/2014   Procedure: Left Heart Cath and Coronary Angiography;  Surgeon: DYolonda Kida MD;  [facility];  Service: Cardiovascular;  Laterality: N/A;  . CORONARY ANGIOPLASTY     Non-ST elevation MI status post stenting within bare-metal stent of the 75% lesion of the LAD, 11/13/2010.   . CORONARY STENT INTERVENTION N/A 02/02/2017   Procedure: CORONARY/GRAFT ACUTE MI REVASCULARIZATION;  Surgeon: CYolonda Kida MD;  [facility];  Service: Cardiovascular;  Laterality: N/A;  . LEFT HEART CATH AND CORONARY ANGIOGRAPHY N/A 02/02/2017   Procedure: LEFT HEART CATH AND CORONARY ANGIOGRAPHY;  Surgeon: CYolonda Kida MD;  [facility];  Service: Cardiovascular;  Laterality: N/A;    Current Medications: Current Meds  Medication Sig  . aspirin EC 81 MG EC tablet Take 1 tablet (81 mg total) by mouth daily.  .Marland Kitchenaspirin-acetaminophen-caffeine (EXCEDRIN MIGRAINE) 250-250-65 MG tablet Take 1 tablet by mouth daily as needed for headache. (Patient taking differently: Take 2 tablets by mouth daily as needed for headache. )  . Aspirin-Acetaminophen-Caffeine 520-260-32.5 MG PACK Take 1 tablet by mouth every 8 (eight) hours as needed for migraine. Patient takes 2-3 times daily at migraine onset  . Blood Pressure KIT 1 kit by Does not apply route daily.  . clopidogrel (PLAVIX) 75 MG tablet TAKE ONE TABLET BY MOUTH EVERY DAY  . Docosanol (ABREVA) 10 % CREA Apply 1 application topically 2 (two) times daily. (Patient taking differently: Apply 1 application topically 2 (two) times daily as needed. )  . DULoxetine (CYMBALTA) 30 MG  capsule Take 1 capsule (30 mg total) by mouth daily. (Patient taking differently: Take 30 mg by mouth 2 (two) times daily. )  . ezetimibe (ZETIA) 10 MG tablet TAKE ONE TABLET BY MOUTH EVERY DAY  . gabapentin (NEURONTIN) 100 MG capsule TAKE ONE CAPSULE BY MOUTH 3 TIMES A DAY  . lisinopril (ZESTRIL) 20 MG tablet Take 1 tablet (20 mg total) by mouth daily.  . meloxicam (MOBIC) 15 MG tablet TAKE ONE TABLET BY MOUTH EVERY DAY AS NEEDED FOR PAIN  . metoprolol tartrate (LOPRESSOR) 25 MG tablet TAKE ONE TABLET BY MOUTH 2 TIMES A DAY  . nitroGLYCERIN (NITROSTAT) 0.4 MG SL tablet Place 1 tablet (0.4 mg total) under the tongue every 5 (five) minutes x 3 doses as needed for chest pain.  . pantoprazole (PROTONIX) 40 MG tablet TAKE ONE TABLET BY MOUTH EVERY DAY  . potassium chloride (KLOR-CON) 10 MEQ tablet Take 2 tablets (20 mEq total) by mouth 2 (two) times daily.  . rosuvastatin (CRESTOR) 40 MG tablet Take 1 tablet (40  mg total) by mouth daily.  Marland Kitchen terbinafine (ATHLETES FOOT AF) 1 % cream Apply 1 application topically 2 (two) times daily.  Marland Kitchen torsemide (DEMADEX) 20 MG tablet Take 1 tablet (20 mg total) by mouth 2 (two) times daily. (Patient taking differently: Take 20 mg by mouth. 59m daily and additional 246mPM PRN)    Allergies:   Atorvastatin   Social History   Socioeconomic History  . Marital status: Single    Spouse name: Not on file  . Number of children: 2  . Years of education: Not on file  . Highest education level: Not on file  Occupational History  . Occupation: BrProofreader  Comment: Currently on disability/unemployed  Tobacco Use  . Smoking status: Current Some Day Smoker    Packs/day: 1.00    Years: 5.00    Pack years: 5.00    Types: Cigarettes    Last attempt to quit: 01/05/2017    Years since quitting: 3.1  . Smokeless tobacco: Never Used  . Tobacco comment: Typically 1 pack a week but recently sometimes 2 packs 10/18/19  Vaping Use  . Vaping Use: Never used  Substance  and Sexual Activity  . Alcohol use: Not Currently    Alcohol/week: 1.0 standard drink    Types: 1 Cans of beer per week    Comment: Social Drinker  . Drug use: Yes    Types: Marijuana    Comment: "Every now and then"  . Sexual activity: Yes  Other Topics Concern  . Not on file  Social History Narrative   Social determinants screening completed. HS   Social Determinants of Health   Financial Resource Strain: High Risk  . Difficulty of Paying Living Expenses: Hard  Food Insecurity: No Food Insecurity  . Worried About RuCharity fundraisern the Last Year: Never true  . Ran Out of Food in the Last Year: Never true  Transportation Needs: No Transportation Needs  . Lack of Transportation (Medical): No  . Lack of Transportation (Non-Medical): No  Physical Activity: Sufficiently Active  . Days of Exercise per Week: 5 days  . Minutes of Exercise per Session: 40 min  Stress: Stress Concern Present  . Feeling of Stress : To some extent  Social Connections: Moderately Isolated  . Frequency of Communication with Friends and Family: Three times a week  . Frequency of Social Gatherings with Friends and Family: Three times a week  . Attends Religious Services: More than 4 times per year  . Active Member of Clubs or Organizations: No  . Attends ClArchivisteetings: Never  . Marital Status: Separated     Family History:  The patient's family history includes CAD in his father. There is no history of Kidney disease or Diabetes Mellitus II.  ROS:   Review of Systems  Constitutional: Positive for diaphoresis and malaise/fatigue. Negative for chills, fever and weight loss.  HENT: Negative for congestion.   Eyes: Negative for discharge and redness.  Respiratory: Positive for shortness of breath. Negative for cough, sputum production and wheezing.   Cardiovascular: Positive for chest pain. Negative for palpitations, orthopnea, claudication, leg swelling and PND.  Gastrointestinal:  Negative for abdominal pain, heartburn, nausea and vomiting.  Musculoskeletal: Positive for myalgias. Negative for falls.  Skin: Negative for rash.  Neurological: Positive for weakness. Negative for dizziness, tingling, tremors, sensory change, speech change, focal weakness and loss of consciousness.  Endo/Heme/Allergies: Does not bruise/bleed easily.  Psychiatric/Behavioral: Negative for substance abuse. The  patient is not nervous/anxious.   All other systems reviewed and are negative.    EKGs/Labs/Other Studies Reviewed:    Studies reviewed were summarized above. The additional studies were reviewed today: As above.  EKG:  EKG is ordered today.  The EKG ordered today demonstrates NSR, 81 bpm, LBBB with left bundle branch previously being noted on an isolated EKG from 08/2018. EKG reviewed with STEMI MD.  Recent Labs: 03/30/2019: B Natriuretic Peptide 10.0 04/14/2019: ALT 29 05/26/2019: Hemoglobin 14.0; Platelets 197 12/16/2019: BUN 15; Creatinine, Ser 0.96; Magnesium 2.2; Potassium 4.4; Sodium 139  Recent Lipid Panel    Component Value Date/Time   CHOL 188 04/14/2019 0925   CHOL 184 04/06/2019 1512   TRIG 261 (H) 04/14/2019 0925   HDL 49 04/14/2019 0925   HDL 40 04/06/2019 1512   CHOLHDL 3.8 04/14/2019 0925   VLDL 52 (H) 04/14/2019 0925   LDLCALC 87 04/14/2019 0925   LDLCALC 77 04/06/2019 1512   LDLDIRECT 107 (H) 04/06/2019 1512    PHYSICAL EXAM:    VS:  BP 140/82 (BP Location: Left Arm, Patient Position: Sitting, Cuff Size: Normal)   Pulse 81   Ht '5\' 8"'  (1.727 m)   Wt 258 lb (117 kg)   SpO2 96%   BMI 39.23 kg/m   BMI: Body mass index is 39.23 kg/m.  Physical Exam Constitutional:      Appearance: He is well-developed.  HENT:     Head: Normocephalic and atraumatic.  Eyes:     General:        Right eye: No discharge.        Left eye: No discharge.  Neck:     Vascular: No JVD.  Cardiovascular:     Rate and Rhythm: Normal rate and regular rhythm.     Pulses: No  midsystolic click and no opening snap.          Posterior tibial pulses are 2+ on the right side and 2+ on the left side.     Heart sounds: Normal heart sounds, S1 normal and S2 normal. Heart sounds not distant. No murmur heard.  No friction rub.  Pulmonary:     Effort: Pulmonary effort is normal. No respiratory distress.     Breath sounds: Normal breath sounds. No decreased breath sounds, wheezing or rales.  Chest:     Chest wall: No tenderness.  Abdominal:     General: There is no distension.     Palpations: Abdomen is soft.     Tenderness: There is no abdominal tenderness.  Musculoskeletal:     Cervical back: Normal range of motion.  Skin:    General: Skin is warm and dry.     Nails: There is no clubbing.  Neurological:     Mental Status: He is alert and oriented to person, place, and time.  Psychiatric:        Speech: Speech normal.        Behavior: Behavior normal.        Thought Content: Thought content normal.        Judgment: Judgment normal.     Wt Readings from Last 3 Encounters:  02/29/20 258 lb (117 kg)  02/29/20 258 lb 2 oz (117.1 kg)  02/15/20 261 lb (118.4 kg)     ASSESSMENT & PLAN:   1. CAD involving the native coronary arteries with unstable angina: Initially, at time of patient's visit he was chest pain-free. However, at the end of his visit he began to redevelop some  left-sided chest soreness that was not reproducible to palpation. Patient's case and EKG were reviewed with STEMI MD and given patient has previously developed a left bundle on EKG in 08/2018 we will not call this a code STEMI at this time. However, he will require ED evaluation and hospital admission for diagnostic cardiac cath prior to discharge. We are unable to directly admit the patient from the office secondary to bed shortage with the COVID-19 pandemic. In this setting, we have signed the patient out to the ED for enzymes cycling, imaging, and admission to the hospitalist service for  cardiology consult. Patient was given ASA 325 mg x 1 in the office. He will otherwise continue clopidogrel, metoprolol, lisinopril, rosuvastatin, and Zetia.  Risks and benefits of cardiac catheterization have been discussed with the patient including risks of bleeding, bruising, infection, kidney damage, stroke, heart attack, urgent need for bypass surgery, injury to limb, and death. The patient understands these risks and is willing to proceed with the procedure. All questions have been answered and concerns listened to.   2. HFrEF secondary to ICM: Patient appears well compensated and euvolemic. Continue GDMT including lisinopril and metoprolol. Hold torsemide in the periprocedural timeframe. Recommend updating echo in the inpatient setting.  3. HTN: Blood pressure is mildly elevated in the office today in the setting of the above. Continue to monitor in the ED and during admission. For now he remains on lisinopril and metoprolol.  4. HLD: LDL of 107 on Crestor and Zetia with goal being less than 70. In the outpatient setting recommend referral to the lipid clinic for consideration of PCSK9 inhibitor.  5. Tobacco use: He continues to smoke approximately 1 pack of cigarettes per week. Complete cessation is recommended.   Disposition: F/u with Dr. Rockey Situ or an APP following recommended hospital admission.   Medication Adjustments/Labs and Tests Ordered: Current medicines are reviewed at length with the patient today.  Concerns regarding medicines are outlined above. Medication changes, Labs and Tests ordered today are summarized above and listed in the Patient Instructions accessible in Encounters.   Signed, Christell Faith, PA-C 02/29/2020 3:46 PM     [address] [place], [place] [address] [phone]

## 2020-02-29 NOTE — H&P (Signed)
[place]   PATIENT NAME: Bryce [name]    MR#:  [id number]  DATE OF BIRTH:  [birth year]  DATE OF ADMISSION:  02/29/2020  PRIMARY CARE PHYSICIAN: Langston Reusing, NP   REQUESTING/REFERRING PHYSICIAN: Lavonia Drafts, MD  CHIEF COMPLAINT:   Chief Complaint  Patient presents with  . Chest Pain    HISTORY OF PRESENT ILLNESS:  Bryce [name]  is a 54 y.o. male with a known history of coronary artery disease status post PCI and stents, dyslipidemia, hypertension and CHF, who presented to the emergency room with a concern of midsternal chest pain which has been intermittent lately and felt as pressure and occasionally sharp pain with associated diaphoresis without nausea or vomiting.  Has been radiating to his left arm.  He denied any dyspnea or palpitations with his pain.  He denied any leg pain or edema recent travels or surgeries.  No bleeding diathesis.  No cough or wheezing or hemoptysis.  The patient was advised to come to the ER by Dr. Rockey Situ his cardiologist for consideration of cardiac catheterization in a.m.  Upon presentation to the emergency room, blood pressure was 146/102 with otherwise normal vital signs and later 135/85.  Labs revealed unremarkable CMP.  BN P was 20 and high-sensitivity troponin I was 6.  CBC was within normal.  COVID-19 PCR was negative and influenza antigens came back negative.  Two-view chest x-ray showed no acute cardiopulmonary disease. EKG showed normal sinus rhythm with rate of 75 with left axis deviation and left bundle branch block that is old.  The patient was chest pain-free during my interview.  He will be admitted to an observation telemetry bed for further evaluation and management. PAST MEDICAL HISTORY:   Past Medical History:  Diagnosis Date  . ASD (atrial septal defect)    a. 04/2019 Echo: small ASD w/ predominantly L->R shunting.  . Coronary artery disease    a. 11/2010 NSTEMI/PCI: BMS -->LAD; b. 02/2017 Lateral STEMI/PCI: LM  nl, LAD patent stent, LCX 100 (3.0x23 Xience Alpine DES), RCA 31m EF 50%; c. 11/2018 MV: Fixed apical and periapical defect (? over-processing). No significant ischemia. EF 48%.  .Marland KitchenGERD (gastroesophageal reflux disease)   . 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 04/2019 Echo: EF 45-50%, mild LVH. Mildly dil LA. Small ASA w/ predominantly L->R shunting.  .Marland KitchenHyperlipidemia LDL goal <70   . Hypertension   . Ischemic cardiomyopathy    a. 04/2019 Echo: EF 45-50%.  .Marland KitchenLBBB (left bundle branch block)   . MI (myocardial infarction) ([place]   . Migraine     PAST SURGICAL HISTORY:   Past Surgical History:  Procedure Laterality Date  . CARDIAC CATHETERIZATION N/A 12/28/2014   Procedure: Left Heart Cath and Coronary Angiography;  Surgeon: DYolonda Kida MD;  [facility];  Service: Cardiovascular;  Laterality: N/A;  . CORONARY ANGIOPLASTY     Non-ST elevation MI status post stenting within bare-metal stent of the 75% lesion of the LAD, 11/13/2010.   . CORONARY STENT INTERVENTION N/A 02/02/2017   Procedure: CORONARY/GRAFT ACUTE MI REVASCULARIZATION;  Surgeon: CYolonda Kida MD;  [facility];  Service: Cardiovascular;  Laterality: N/A;  . LEFT HEART CATH AND CORONARY ANGIOGRAPHY N/A 02/02/2017   Procedure: LEFT HEART CATH AND CORONARY ANGIOGRAPHY;  Surgeon: CYolonda Kida MD;  [facility];  Service: Cardiovascular;  Laterality: N/A;    SOCIAL HISTORY:   Social  History   Tobacco Use  . Smoking status: Current Some Day Smoker    Packs/day: 1.00    Years: 5.00    Pack years: 5.00    Types: Cigarettes    Last attempt to quit: 01/05/2017    Years since quitting: 3.1  . Smokeless tobacco: Never Used  . Tobacco comment: Typically 1 pack a week but recently sometimes 2 packs 10/18/19  Substance Use Topics  . Alcohol use: Not Currently    Alcohol/week: 1.0 standard drink    Types: 1 Cans of beer per week     Comment: Social Drinker    FAMILY HISTORY:   Family History  Problem Relation Age of Onset  . CAD Father   . Kidney disease Neg Hx   . Diabetes Mellitus II Neg Hx     DRUG ALLERGIES:   Allergies  Allergen Reactions  . Atorvastatin     Myalgias    REVIEW OF SYSTEMS:   ROS As per history of present illness. All pertinent systems were reviewed above. Constitutional, HEENT, cardiovascular, respiratory, GI, GU, musculoskeletal, neuro, psychiatric, endocrine, integumentary and hematologic systems were reviewed and are otherwise negative/unremarkable except for positive findings mentioned above in the HPI.   MEDICATIONS AT HOME:   Prior to Admission medications   Medication Sig Start Date End Date Taking? Authorizing Provider  aspirin EC 81 MG EC tablet Take 1 tablet (81 mg total) by mouth daily. 02/05/17   Epifanio Lesches, MD  aspirin-acetaminophen-caffeine (EXCEDRIN MIGRAINE) [phone] MG tablet Take 1 tablet by mouth daily as needed for headache. Patient taking differently: Take 2 tablets by mouth daily as needed for headache.  01/06/19   Iloabachie, Chioma E, NP  Aspirin-Acetaminophen-Caffeine [phone] MG PACK Take 1 tablet by mouth every 8 (eight) hours as needed for migraine. Patient takes 2-3 times daily at migraine onset    [provider]  Blood Pressure KIT 1 kit by Does not apply route daily. 04/01/19   Iloabachie, Chioma E, NP  clopidogrel (PLAVIX) 75 MG tablet TAKE ONE TABLET BY MOUTH EVERY DAY 02/29/20   Iloabachie, Chioma E, NP  Docosanol (ABREVA) 10 % CREA Apply 1 application topically 2 (two) times daily. Patient taking differently: Apply 1 application topically 2 (two) times daily as needed.  01/15/18   Dustin Flock, MD  DULoxetine (CYMBALTA) 30 MG capsule Take 1 capsule (30 mg total) by mouth daily. Patient taking differently: Take 30 mg by mouth 2 (two) times daily.  12/16/19 02/29/20  Iloabachie, Chioma E, NP  ezetimibe (ZETIA) 10 MG tablet TAKE ONE  TABLET BY MOUTH EVERY DAY 01/13/20   Minna Merritts, MD  gabapentin (NEURONTIN) 100 MG capsule TAKE ONE CAPSULE BY MOUTH 3 TIMES A DAY 02/22/20   Iloabachie, Chioma E, NP  lisinopril (ZESTRIL) 20 MG tablet Take 1 tablet (20 mg total) by mouth daily. 02/15/20   Iloabachie, Chioma E, NP  meloxicam (MOBIC) 15 MG tablet TAKE ONE TABLET BY MOUTH EVERY DAY AS NEEDED FOR PAIN 10/28/19   Iloabachie, Chioma E, NP  metoprolol tartrate (LOPRESSOR) 25 MG tablet TAKE ONE TABLET BY MOUTH 2 TIMES A DAY 06/17/19   Iloabachie, Chioma E, NP  nitroGLYCERIN (NITROSTAT) 0.4 MG SL tablet Place 1 tablet (0.4 mg total) under the tongue every 5 (five) minutes x 3 doses as needed for chest pain. 04/06/19   Dunn, Areta Haber, PA-C  pantoprazole (PROTONIX) 40 MG tablet TAKE ONE TABLET BY MOUTH EVERY DAY 10/28/19   Iloabachie, Chioma E, NP  potassium chloride (  KLOR-CON) 10 MEQ tablet Take 2 tablets (20 mEq total) by mouth 2 (two) times daily. 02/15/20   Iloabachie, Chioma E, NP  rosuvastatin (CRESTOR) 40 MG tablet Take 1 tablet (40 mg total) by mouth daily. 04/07/19 10/17/20  Rise Mu, PA-C  terbinafine (ATHLETES FOOT AF) 1 % cream Apply 1 application topically 2 (two) times daily. 05/26/19   Merlyn Lot, MD  torsemide (DEMADEX) 20 MG tablet Take 1 tablet (20 mg total) by mouth 2 (two) times daily. Patient taking differently: Take 20 mg by mouth. 25m daily and additional 28mPM PRN 06/07/19 10/17/20  GoMinna MerrittsMD      VITAL SIGNS:  Blood pressure (!) 159/96, pulse 75, temperature 98.6 F (37 C), resp. rate 19, height '5\' 8"'  (1.727 m), weight 117 kg, SpO2 96 %.  PHYSICAL EXAMINATION:  Physical Exam  GENERAL:  [age].o.-year-old Caucasian male patient lying in the bed with no acute distress.  EYES: Pupils equal, round, reactive to light and accommodation. No scleral icterus. Extraocular muscles intact.  HEENT: Head atraumatic, normocephalic. Oropharynx and nasopharynx clear.  NECK:  Supple, no jugular venous  distention. No thyroid enlargement, no tenderness.  LUNGS: Normal breath sounds bilaterally, no wheezing, rales,rhonchi or crepitation. No use of accessory muscles of respiration.  CARDIOVASCULAR: Regular rate and rhythm, S1, S2 normal. No murmurs, rubs, or gallops.  ABDOMEN: Soft, nondistended, nontender. Bowel sounds present. No organomegaly or mass.  EXTREMITIES: No pedal edema, cyanosis, or clubbing.  NEUROLOGIC: Cranial nerves II through XII are intact. Muscle strength 5/5 in all extremities. Sensation intact. Gait not checked.  PSYCHIATRIC: The patient is alert and oriented x 3.  Normal affect and good eye contact. SKIN: No obvious rash, lesion, or ulcer.   LABORATORY PANEL:   CBC Recent Labs  Lab 02/29/20 1553  WBC 6.2  HGB 14.9  HCT 43.6  PLT 212   ------------------------------------------------------------------------------------------------------------------  Chemistries  Recent Labs  Lab 02/29/20 1553  NA 139  K 3.9  CL 106  CO2 24  GLUCOSE 147*  BUN 13  CREATININE 0.94  CALCIUM 9.1  AST 23  ALT 26  ALKPHOS 58  BILITOT 0.8   ------------------------------------------------------------------------------------------------------------------  Cardiac Enzymes No results for input(s): TROPONINI in the last 168 hours. ------------------------------------------------------------------------------------------------------------------  RADIOLOGY:  DG Chest 2 View  Result Date: 02/29/2020 CLINICAL DATA:  Chest pain EXAM: CHEST - 2 VIEW COMPARISON:  05/26/2019 FINDINGS: The heart size and mediastinal contours are stable. No focal airspace consolidation, pleural effusion, or pneumothorax. The visualized skeletal structures are unremarkable. IMPRESSION: No active cardiopulmonary disease. Electronically Signed   By: NiDavina Poke.O.   On: 02/29/2020 17:03      IMPRESSION AND PLAN:   1.  Recurrent chest pain concerning for unstable angina. -The patient will be  admitted to an observation telemetry bed. -We will follow serial troponin I's. -He will be continued on aspirin and Plavix as well as as needed sublingual nitroglycerin and morphine sulfate for pain. -We will continue statin therapy and beta-blocker therapy. -Cardiology consultation will be obtained. -Dr. CaCurt Bearsas notified about the patient.  2.  Essential hypertension. -We will continue Zestril and Lopressor.  3.  Dyslipidemia. -We will continue statin therapy and Zetia.  4.  Peripheral neuropathy. -We will continue Neurontin.  5.  GERD. -We will continue PPI therapy.  6.  DVT prophylaxis. -Subcutaneous Lovenox.   All the records are reviewed and case discussed with ED provider. The plan of care was discussed in details with the patient (  and family). I answered all questions. The patient agreed to proceed with the above mentioned plan. Further management will depend upon hospital course.   CODE STATUS: Full code  Status is: Observation  The patient remains OBS appropriate and will d/c before 2 midnights.  Dispo: The patient is from: Home              Anticipated d/c is to: Home              Anticipated d/c date is: 1 day              Patient currently is not medically stable to d/c.   TOTAL TIME TAKING CARE OF THIS PATIENT: 55 minutes.    Christel Mormon M.D on 02/29/2020 at 8:43 PM  [facility]   From 7 PM-7 AM, contact night-coverage [email or web address]  CC: Primary care physician; Langston Reusing, NP

## 2020-02-29 NOTE — Progress Notes (Signed)
Patient ID: Bryce [name], male    DOB: September 29, 1965, 54 y.o.   MRN: [id number]   Bryce [name] is a 54 y/o male with a history of CAD (MI), HTN, GERD, tobacco use and chronic heart failure.   Echo report from 04/26/2019 reviewed and showed an EF of 45-50%. Echo report from 02/03/17 reviewed and showed an EF of 45-50% along with mild inferior hypokinesis.   Catheterization done 02/02/17 which showed:  Mid LAD lesion, 0 %stenosed.  Mid RCA lesion, 40 %stenosed.  A STENT XIENCE ALPINE RX 3.0X23 drug eluting stent was successfully placed, and does not overlap previously placed stent.  Dist Cx lesion, 100 %stenosed.  Post intervention, there is a 0% residual stenosis.  There is mild left ventricular systolic dysfunction.  LV end diastolic pressure is mildly elevated.  The left ventricular ejection fraction is 50-55% by visual estimate. Conclusion Successful PCI and stent to mid to distal circumflex with DES stent 3.023 mm xience Reducing the lesion from 100% down to 0 point TIMI-3 from 0 back to 3.  Patient has had not ED visits or hospitalizations in 6 months.   He presents today for a follow-up visit with a chief complaint of previous chest pain 10 days ago that woke him out of his sleep, was crushing in nature with pain radiating down his left arm. Patient states he took x2 nitroglycerin tablets and then went back to sleep. He states he did not go to the ER because he did not "want to wait for days".   Past Medical History:  Diagnosis Date  . ASD (atrial septal defect)    a. 04/2019 Echo: small ASD w/ predominantly L->R shunting.  . Coronary artery disease    a. 11/2010 NSTEMI/PCI: BMS -->LAD; b. 02/2017 Lateral STEMI/PCI: LM nl, LAD patent stent, LCX 100 (3.0x23 Xience Alpine DES), RCA 67m EF 50%; c. 11/2018 MV: Fixed apical and periapical defect (? over-processing). No significant ischemia. EF 48%.  .Marland KitchenGERD (gastroesophageal reflux disease)   . 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 04/2019 Echo: EF 45-50%, mild LVH. Mildly dil LA. Small ASA w/ predominantly L->R shunting.  .Marland KitchenHyperlipidemia LDL goal <70   . Hypertension   . Ischemic cardiomyopathy    a. 04/2019 Echo: EF 45-50%.  .Marland KitchenLBBB (left bundle branch block)   . MI ([place]ocardial infarction) ([place]   . Migraine    Past Surgical History:  Procedure Laterality Date  . CARDIAC CATHETERIZATION N/A 12/28/2014   Procedure: Left Heart Cath and Coronary Angiography;  Surgeon: DYolonda Kida MD;  [facility];  Service: Cardiovascular;  Laterality: N/A;  . CORONARY ANGIOPLASTY     Non-ST elevation MI status post stenting within bare-metal stent of the 75% lesion of the LAD, 11/13/2010.   . CORONARY STENT INTERVENTION N/A 02/02/2017   Procedure: CORONARY/GRAFT ACUTE MI REVASCULARIZATION;  Surgeon: CYolonda Kida MD;  [facility];  Service: Cardiovascular;  Laterality: N/A;  . LEFT HEART CATH AND CORONARY ANGIOGRAPHY N/A 02/02/2017   Procedure: LEFT HEART CATH AND CORONARY ANGIOGRAPHY;  Surgeon: CYolonda Kida MD;  [facility];  Service: Cardiovascular;  Laterality: N/A;   Family History  Problem Relation Age of Onset  . CAD Father   . Kidney disease Neg Hx   . Diabetes Mellitus II Neg Hx    Social History   Tobacco Use  . Smoking status: Current Some Day Smoker  Packs/day: 1.00    Years: 5.00    Pack years: 5.00    Types: Cigarettes    Last attempt to quit: 01/05/2017    Years since quitting: 3.1  . Smokeless tobacco: Never Used  . Tobacco comment: Typically 1 pack a week but recently sometimes 2 packs 10/18/19  Substance Use Topics  . Alcohol use: Not Currently    Alcohol/week: 1.0 standard drink    Types: 1 Cans of beer per week    Comment: Social Drinker   Allergies  Allergen Reactions  . Atorvastatin     Myalgias   Prior to Admission medications   Medication Sig Start Date End Date Taking? Authorizing  Provider  aspirin EC 81 MG EC tablet Take 1 tablet (81 mg total) by mouth daily. 02/05/17  Yes Epifanio Lesches, MD  aspirin-acetaminophen-caffeine (EXCEDRIN MIGRAINE) [phone] MG tablet Take 1 tablet by mouth daily as needed for headache. 01/06/19  Yes Iloabachie, Chioma E, NP  Blood Pressure KIT 1 kit by Does not apply route daily. 04/01/19  Yes Iloabachie, Chioma E, NP  clopidogrel (PLAVIX) 75 MG tablet TAKE ONE TABLET BY MOUTH EVERY DAY 09/17/18  Yes Iloabachie, Chioma E, NP  Cyanocobalamin (VITAMIN B 12) 100 MCG LOZG Take 1,000 mcg by mouth daily. 04/01/19  Yes Iloabachie, Chioma E, NP  cyclobenzaprine (FLEXERIL) 10 MG tablet Take 1 tablet (10 mg total) by mouth 3 (three) times daily as needed. 04/06/19  Yes Dunn, Ryan M, PA-C  Docosanol (ABREVA) 10 % CREA Apply 1 application topically 2 (two) times daily. 01/15/18  Yes Dustin Flock, MD  ezetimibe (ZETIA) 10 MG tablet Take 1 tablet (10 mg total) by mouth daily. 11/03/18 05/31/19 Yes Gollan, Kathlene November, MD  furosemide (LASIX) 20 MG tablet Take 1 tablet (20 mg total) by mouth daily. 04/06/19 05/31/19 Yes Dunn, Areta Haber, PA-C  gabapentin (NEURONTIN) 100 MG capsule TAKE ONE CAPSULE BY MOUTH 3 TIMES A DAY 05/04/19  Yes Iloabachie, Chioma E, NP  lisinopril (ZESTRIL) 20 MG tablet Take 1 tablet (20 mg total) by mouth daily. 04/01/19  Yes Iloabachie, Chioma E, NP  meloxicam (MOBIC) 15 MG tablet TAKE ONE TABLET BY MOUTH EVERY DAY AS NEEDED FOR PAIN 05/04/19  Yes Iloabachie, Chioma E, NP  metoprolol tartrate (LOPRESSOR) 25 MG tablet TAKE ONE TABLET BY MOUTH 2 TIMES A DAY 04/01/19  Yes Iloabachie, Chioma E, NP  nitroGLYCERIN (NITROSTAT) 0.4 MG SL tablet Place 1 tablet (0.4 mg total) under the tongue every 5 (five) minutes x 3 doses as needed for chest pain. 04/06/19  Yes Dunn, Areta Haber, PA-C  pantoprazole (PROTONIX) 40 MG tablet Take 1 tablet (40 mg total) by mouth daily. 05/10/19  Yes Iloabachie, Chioma E, NP  potassium chloride (KLOR-CON) 10 MEQ tablet Take 1  tablet (10 mEq total) by mouth daily. Also take 2 tablets ( 20 mEq for the next 3 days and continue on 10 mEq daily) 05/13/19  Yes Iloabachie, Chioma E, NP  rosuvastatin (CRESTOR) 40 MG tablet Take 1 tablet (40 mg total) by mouth daily. 04/07/19 07/06/19 Yes Dunn, Areta Haber, PA-C  terbinafine (ATHLETES FOOT AF) 1 % cream Apply 1 application topically 2 (two) times daily. 05/26/19  Yes Merlyn Lot, MD     Review of Systems  Constitutional: Positive for fatigue (tire easily). Negative for appetite change.  HENT: Negative for congestion, postnasal drip and sore throat.   Eyes: Negative.   Respiratory: Positive for shortness of breath. Negative for cough.   Cardiovascular: Positive for chest pain  and leg swelling. Negative for palpitations.  Gastrointestinal: Negative for abdominal distention and abdominal pain.  Endocrine: Negative.   Genitourinary: Negative.   Musculoskeletal: Negative for back pain and neck pain.  Skin: Negative.   Allergic/Immunologic: Negative.   Neurological: Negative for dizziness and light-headedness.  Hematological: Negative for adenopathy. Does not bruise/bleed easily.  Psychiatric/Behavioral: Positive for dysphoric mood. Negative for sleep disturbance (sleeping on 2 pillows).   Vitals:   02/29/20 1232 02/29/20 1258  BP: (!) 146/102 138/90  Pulse: 82   Resp: 20   SpO2: 97%   Weight: 258 lb 2 oz (117.1 kg)   Height: _0  (1.727 m)    Wt Readings from Last 3 Encounters:  02/29/20 258 lb 2 oz (117.1 kg)  02/15/20 261 lb (118.4 kg)  12/16/19 255 lb 4 oz (115.8 kg)   Lab Results  Component Value Date   CREATININE 0.96 12/16/2019   CREATININE 1.21 05/26/2019   CREATININE 0.89 04/14/2019    Physical Exam Vitals and nursing note reviewed.  Constitutional:      Appearance: Normal appearance.  HENT:     Head: Normocephalic and atraumatic.  Cardiovascular:     Rate and Rhythm: Normal rate and regular rhythm.  Pulmonary:     Effort: Pulmonary effort is  normal. No respiratory distress.     Breath sounds: No wheezing or rales.  Abdominal:     General: There is distension.     Palpations: Abdomen is soft.  Musculoskeletal:        General: No tenderness.     Cervical back: Normal range of motion and neck supple.     Right lower leg: No tenderness. Edema (Trace edema at ankles) present.     Left lower leg: No tenderness. Edema (Trace edema at ankles) present.  Skin:    General: Skin is warm and dry.  Neurological:     General: No focal deficit present.     Mental Status: He is alert and oriented to person, place, and time.  Psychiatric:        Mood and Affect: Mood normal.        Behavior: Behavior normal.        Thought Content: Thought content normal.      Assessment & Plan:  1: Chronic heart failure with mildly reduced ejection fraction- - NYHA class III - Trace edema noted at ankles - weighing daily and he was reminded to call for an overnight weight gain of >2 pounds or a weekly weight gain of >5 pounds - States he "eats salt like crazy and aint gonna stop because I get cramps", encouraged to decrease sodium content due to disease process.  -States he also consumes 8-10 bottles of water daily because he "sweats like crazy". Teaching done to explain that he needs to reduce to 4-5 cups daily due to fluid retention and overload.  - saw cardiology Sharolyn Douglas) 12/16/19 - BNP 03/30/2019 was 10.0 - elevating his legs when sitting for long periods of time; encouraged him to get compression socks and put them on in the morning with removal at bedtime  2: HTN- - BP slightly elevated today  - saw PCP (Iloabachie) 9/14/20121 - BMP 12/16/2019 reviewed and showed sodium 139, potassium 4.4, creatinine 0.96 and GFR 89  3. Chest Pain - Patient to follow up with cardiologist at 3pm today due to complaints of chest pain and associated risk factors.   4: Tobacco and Alcohol use - smokes "some" - Patient endorses high stressors, lack  of "physical  contact" with significant other, and living in the garage.  - Patient endorses drinking "some" beer and at most 2 during sporting events or social gatherings - Patient not interested in talking about cessation at this time due to "lots of stress"  Patient did not bring his medications nor a list. Each medication was verbally reviewed with the patient and he was encouraged to bring the bottles to every visit to confirm accuracy of list.  Patient to follow up in 3 months or sooner if needed.

## 2020-03-01 DIAGNOSIS — Z79899 Other long term (current) drug therapy: Secondary | ICD-10-CM | POA: Diagnosis not present

## 2020-03-01 DIAGNOSIS — E785 Hyperlipidemia, unspecified: Secondary | ICD-10-CM

## 2020-03-01 DIAGNOSIS — I5022 Chronic systolic (congestive) heart failure: Secondary | ICD-10-CM | POA: Diagnosis present

## 2020-03-01 DIAGNOSIS — I110 Hypertensive heart disease with heart failure: Secondary | ICD-10-CM | POA: Diagnosis present

## 2020-03-01 DIAGNOSIS — I200 Unstable angina: Secondary | ICD-10-CM | POA: Diagnosis present

## 2020-03-01 DIAGNOSIS — I208 Other forms of angina pectoris: Secondary | ICD-10-CM | POA: Diagnosis not present

## 2020-03-01 DIAGNOSIS — Z7902 Long term (current) use of antithrombotics/antiplatelets: Secondary | ICD-10-CM | POA: Diagnosis not present

## 2020-03-01 DIAGNOSIS — I480 Paroxysmal atrial fibrillation: Secondary | ICD-10-CM | POA: Diagnosis present

## 2020-03-01 DIAGNOSIS — I255 Ischemic cardiomyopathy: Secondary | ICD-10-CM | POA: Diagnosis present

## 2020-03-01 DIAGNOSIS — I25110 Atherosclerotic heart disease of native coronary artery with unstable angina pectoris: Secondary | ICD-10-CM | POA: Diagnosis present

## 2020-03-01 DIAGNOSIS — Z955 Presence of coronary angioplasty implant and graft: Secondary | ICD-10-CM | POA: Diagnosis not present

## 2020-03-01 DIAGNOSIS — Z7982 Long term (current) use of aspirin: Secondary | ICD-10-CM | POA: Diagnosis not present

## 2020-03-01 DIAGNOSIS — I252 Old myocardial infarction: Secondary | ICD-10-CM | POA: Diagnosis not present

## 2020-03-01 DIAGNOSIS — Z6839 Body mass index (BMI) 39.0-39.9, adult: Secondary | ICD-10-CM | POA: Diagnosis not present

## 2020-03-01 DIAGNOSIS — Z720 Tobacco use: Secondary | ICD-10-CM | POA: Diagnosis not present

## 2020-03-01 DIAGNOSIS — G629 Polyneuropathy, unspecified: Secondary | ICD-10-CM | POA: Diagnosis present

## 2020-03-01 DIAGNOSIS — I5020 Unspecified systolic (congestive) heart failure: Secondary | ICD-10-CM

## 2020-03-01 DIAGNOSIS — I447 Left bundle-branch block, unspecified: Secondary | ICD-10-CM | POA: Diagnosis present

## 2020-03-01 DIAGNOSIS — Z20822 Contact with and (suspected) exposure to covid-19: Secondary | ICD-10-CM | POA: Diagnosis present

## 2020-03-01 DIAGNOSIS — K219 Gastro-esophageal reflux disease without esophagitis: Secondary | ICD-10-CM | POA: Diagnosis present

## 2020-03-01 DIAGNOSIS — F17210 Nicotine dependence, cigarettes, uncomplicated: Secondary | ICD-10-CM | POA: Diagnosis present

## 2020-03-01 DIAGNOSIS — R079 Chest pain, unspecified: Secondary | ICD-10-CM | POA: Diagnosis not present

## 2020-03-01 DIAGNOSIS — I10 Essential (primary) hypertension: Secondary | ICD-10-CM | POA: Diagnosis not present

## 2020-03-01 LAB — PROTIME-INR
INR: 1 (ref 0.8–1.2)
Prothrombin Time: 12.3 seconds (ref 11.4–15.2)

## 2020-03-01 LAB — LIPID PANEL
Cholesterol: 102 mg/dL (ref 0–200)
HDL: 41 mg/dL (ref >40)
LDL Cholesterol: 46 mg/dL (ref 0–99)
Total CHOL/HDL Ratio: 2.5 RATIO
Triglycerides: 77 mg/dL (ref <150)
VLDL: 15 mg/dL (ref 0–40)

## 2020-03-01 LAB — GLUCOSE, CAPILLARY: Glucose-Capillary: 147 mg/dL — ABNORMAL HIGH (ref 70–99)

## 2020-03-01 LAB — APTT: aPTT: 29 seconds (ref 24–36)

## 2020-03-01 LAB — TROPONIN I (HIGH SENSITIVITY)
Troponin I (High Sensitivity): 4 ng/L (ref <18)
Troponin I (High Sensitivity): 3 ng/L (ref <18)
Troponin I (High Sensitivity): 4 ng/L (ref <18)

## 2020-03-01 LAB — HEPARIN LEVEL (UNFRACTIONATED): Heparin Unfractionated: <0.1 IU/mL — ABNORMAL LOW (ref 0.30–0.70)

## 2020-03-01 LAB — HIV ANTIBODY (ROUTINE TESTING W REFLEX): HIV Screen 4th Generation wRfx: NONREACTIVE

## 2020-03-01 MED ORDER — HEPARIN BOLUS VIA INFUSION
5000.0000 [IU] | Freq: Once | INTRAVENOUS | Status: AC
  Administered 2020-03-01: 5000 [IU] via INTRAVENOUS
  Filled 2020-03-01: qty 5000

## 2020-03-01 MED ORDER — SODIUM CHLORIDE 0.9 % IV SOLN: 250.0000 mL | INTRAVENOUS | Status: DC | PRN

## 2020-03-01 MED ORDER — SODIUM CHLORIDE 0.9% FLUSH
3.0000 mL | Freq: Two times a day (BID) | INTRAVENOUS | Status: DC
  Administered 2020-03-01: 3 mL via INTRAVENOUS

## 2020-03-01 MED ORDER — HYDROCODONE-ACETAMINOPHEN 5-325 MG PO TABS
1.0000 | ORAL_TABLET | Freq: Once | ORAL | Status: AC
  Administered 2020-03-01: 1 via ORAL
  Filled 2020-03-01: qty 1

## 2020-03-01 MED ORDER — ISOSORBIDE MONONITRATE ER 30 MG PO TB24
30.0000 mg | ORAL_TABLET | Freq: Every day | ORAL | Status: DC
  Administered 2020-03-01 – 2020-03-02 (×2): 30 mg via ORAL
  Filled 2020-03-01 (×3): qty 1

## 2020-03-01 MED ORDER — SODIUM CHLORIDE 0.9% FLUSH: 3.0000 mL | INTRAVENOUS | Status: DC | PRN

## 2020-03-01 MED ORDER — ENOXAPARIN SODIUM 40 MG/0.4ML ~~LOC~~ SOLN
40.0000 mg | SUBCUTANEOUS | Status: DC
  Administered 2020-03-01: 40 mg via SUBCUTANEOUS
  Filled 2020-03-01: qty 0.4

## 2020-03-01 MED ORDER — SODIUM CHLORIDE 0.9 % IV SOLN: INTRAVENOUS | Status: DC

## 2020-03-01 MED ORDER — HEPARIN (PORCINE) 25000 UT/250ML-% IV SOLN
1400.0000 [IU]/h | INTRAVENOUS | Status: DC
  Administered 2020-03-01: 1400 [IU]/h via INTRAVENOUS
  Filled 2020-03-01: qty 250

## 2020-03-01 MED ORDER — ASPIRIN 81 MG PO CHEW
81.0000 mg | CHEWABLE_TABLET | ORAL | Status: AC
  Administered 2020-03-02: 81 mg via ORAL
  Filled 2020-03-01: qty 1

## 2020-03-01 NOTE — Progress Notes (Signed)
ANTICOAGULATION CONSULT NOTE - Initial Consult  Pharmacy Consult for Heparin Indication: atrial fibrillation  Allergies  Allergen Reactions  . Atorvastatin     Myalgias    Patient Measurements: Height: 5\' 8"  (172.7 cm) Weight: 117 kg (258 lb) IBW/kg (Calculated) : 68.4 Heparin Dosing Weight: 95 kg  Vital Signs: Temp: 98.6 F (37 C) (09/29 1000) Temp Source: Oral (09/29 1000) BP: 124/78 (09/29 0900) Pulse Rate: 58 (09/29 1000)  Labs: Recent Labs    02/29/20 1553 03/01/20 0615 03/01/20 0834  HGB 14.9  --   --   HCT 43.6  --   --   PLT 212  --   --   CREATININE 0.94  --   --   TROPONINIHS 6 4 3     Estimated Creatinine Clearance: 110.3 mL/min (by C-G formula based on SCr of 0.94 mg/dL).   Medical History: Past Medical History:  Diagnosis Date  . ASD (atrial septal defect)    a. 04/2019 Echo: small ASD w/ predominantly L->R shunting.  . Coronary artery disease    a. 11/2010 NSTEMI/PCI: BMS -->LAD; b. 02/2017 Lateral STEMI/PCI: LM nl, LAD patent stent, LCX 100 (3.0x23 Xience Alpine DES), RCA 52m. EF 50%; c. 11/2018 MV: Fixed apical and periapical defect (? over-processing). No significant ischemia. EF 48%.  43m GERD (gastroesophageal reflux disease)   . 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 04/2019 Echo: EF 45-50%, mild LVH. Mildly dil LA. Small ASA w/ predominantly L->R shunting.  Marland Kitchen Hyperlipidemia LDL goal <70   . Hypertension   . Ischemic cardiomyopathy    a. 04/2019 Echo: EF 45-50%.  Marland Kitchen LBBB (left bundle branch block)   . MI (myocardial infarction) (H[facility])   . Migraine    Assessment: Patient is 54yo male with h/o CAD, s/p PCI presented to ED with chest pain. Patient found to be afib. Pharmacy consulted for Heparin dosing. Reviewed prior to admission medications, no anticoags listed. Patient did receive Enoxaparin 40mg  around 01:45 this morning.  Goal of Therapy:  Heparin level 0.3-0.7 units/ml Monitor platelets by anticoagulation  protocol: Yes   Plan:  Heparin 5000 units IV bolus Heparin 1400 units/hr infusion Heparin level 6 hours after start of infusion Daily CBC while on Heparin drip.  Marland Kitchen, PharmD, BCPS 03/01/2020 10:23 AM

## 2020-03-01 NOTE — Progress Notes (Signed)
Pt arrived to the unit. He appears to be in no apparent distress Heparin gtt initiated as order New IV placed.  RA. VSS Pt oriented to room and call bell. Educated on plan of care Verbalizes an understanding.  Denies any additional wants or needs at this time Denies any chest pain.  Call bell within reach Will continue to closely monitor.

## 2020-03-01 NOTE — Progress Notes (Addendum)
1       PROGRESS NOTE    Bryce [name]  VCB:[id number] DOB: 1966-03-31 DOA: 02/29/2020 PCP: Rolm Gala, NP    Brief Narrative: Per dictated history and physical Bryce [name]  is a 54 y.o. male with a known history of coronary artery disease status post PCI and stents, dyslipidemia, hypertension and CHF, who presented to the emergency room with a concern of midsternal chest pain which has been intermittent lately and felt as pressure and occasionally sharp pain with associated diaphoresis without nausea or vomiting.  Has been radiating to his left arm.  The patient was advised to come to the ER by Dr. Mariah Milling his cardiologist for consideration of cardiac catheterization while in the hospital.  Upon presentation to the emergency room, blood pressure was 146/102 with otherwise normal vital signs and later 135/85.  Labs revealed unremarkable CMP.  BN P was 20 and high-sensitivity troponin I was 6.  CBC was within normal.  COVID-19 PCR was negative and influenza antigens came back negative.  Two-view chest x-ray showed no acute cardiopulmonary disease. EKG showed normal sinus rhythm with rate of 75 with left axis deviation and left bundle branch block that is old.  The patient was chest pain-free during my interview.   Assessment & Plan:   Active Problems:   Chest pain   Unstable angina (HCC)   Tobacco abuse   HFrEF (heart failure with reduced ejection fraction) (HCC)  Unstable angina No acute EKG changes.  Serial troponins negative.  Chest pain-free at this time. Continue aspirin, Plavix, beta-blocker, Imdur and statin Cardiology consult, planning for cardiac cath in the morning tomorrow.  Essential hypertension Continue Lopressor, Zestril  Hyperlipidemia Continue Zetia  Peripheral neuropathy Continue Neurontin  Morbid Obesity Body mass index is 39.23 kg/m.      DVT prophylaxis:    enoxaparin (LOVENOX) injection 40 mg Start: 03/01/20 1345    Code Status:    Full Code Family Communication: Mother at bedside Disposition Plan: Cardiac cath planned for tomorrow  status is: Inpatient  Remains inpatient appropriate because:Ongoing diagnostic testing needed not appropriate for outpatient work up   Dispo: The patient is from: Home              Anticipated d/c is to: Home              Anticipated d/c date is: 2 days              Patient currently is not medically stable to d/c.  Cardiac cath planned for tomorrow     Consultants:   Cardiology   Procedures: Cath planned for tomorrow   Antimicrobials:    Subjective: Comfortable, chest pain-free  Objective: Vitals:   03/01/20 0731 03/01/20 0811 03/01/20 0900 03/01/20 1000  BP:  124/77 124/78 (!) 99/58  Pulse:  71 61 (!) 58  Resp:  (!) 21 (!) 25 20  Temp:    98.6 F (37 C)  TempSrc:    Oral  SpO2: 95% 94% 94% 96%  Weight:      Height:        Intake/Output Summary (Last 24 hours) at 03/01/2020 1328 Last data filed at 03/01/2020 0939 Gross per 24 hour  Intake --  Output 1950 ml  Net -1950 ml   Filed Weights   02/29/20 1624  Weight: 117 kg    Examination:  General exam: Appears calm and comfortable  Respiratory system: Clear to auscultation. Respiratory effort normal. Cardiovascular system: S1 & S2 heard, RRR.  No JVD, murmurs, rubs, gallops or clicks. No pedal edema. Gastrointestinal system: Abdomen is nondistended, soft and nontender. No organomegaly or masses felt. Normal bowel sounds heard. Central nervous system: Alert and oriented. No focal neurological deficits. Extremities: Symmetric 5 x 5 power. Skin: No rashes, lesions or ulcers Psychiatry: Judgement and insight appear normal. Mood & affect appropriate.     Data Reviewed: I have personally reviewed following labs and imaging studies  CBC: Recent Labs  Lab 02/29/20 1553  WBC 6.2  NEUTROABS 3.7  HGB 14.9  HCT 43.6  MCV 94.0  PLT 212   Basic Metabolic Panel: Recent Labs  Lab 02/29/20 1553  NA  139  K 3.9  CL 106  CO2 24  GLUCOSE 147*  BUN 13  CREATININE 0.94  CALCIUM 9.1   GFR: Estimated Creatinine Clearance: 110.3 mL/min (by C-G formula based on SCr of 0.94 mg/dL). Liver Function Tests: Recent Labs  Lab 02/29/20 1553  AST 23  ALT 26  ALKPHOS 58  BILITOT 0.8  PROT 7.3  ALBUMIN 4.6   No results for input(s): LIPASE, AMYLASE in the last 168 hours. No results for input(s): AMMONIA in the last 168 hours. Coagulation Profile: Recent Labs  Lab 03/01/20 1029  INR 1.0   Cardiac Enzymes: No results for input(s): CKTOTAL, CKMB, CKMBINDEX, TROPONINI in the last 168 hours. BNP (last 3 results) No results for input(s): PROBNP in the last 8760 hours. HbA1C: No results for input(s): HGBA1C in the last 72 hours. CBG: No results for input(s): GLUCAP in the last 168 hours. Lipid Profile: Recent Labs    03/01/20 0615  CHOL 102  HDL 41  LDLCALC 46  TRIG 77  CHOLHDL 2.5   Thyroid Function Tests: No results for input(s): TSH, T4TOTAL, FREET4, T3FREE, THYROIDAB in the last 72 hours. Anemia Panel: No results for input(s): VITAMINB12, FOLATE, FERRITIN, TIBC, IRON, RETICCTPCT in the last 72 hours. Sepsis Labs: No results for input(s): PROCALCITON, LATICACIDVEN in the last 168 hours.  Recent Results (from the past 240 hour(s))  Respiratory Panel by RT PCR (Flu A&B, Covid) - Nasopharyngeal Swab     Status: None   Collection Time: 02/29/20  8:07 PM   Specimen: Nasopharyngeal Swab  Result Value Ref Range Status   SARS Coronavirus 2 by RT PCR NEGATIVE NEGATIVE Final    Comment: (NOTE) SARS-CoV-2 target nucleic acids are NOT DETECTED.  The SARS-CoV-2 RNA is generally detectable in upper respiratoy specimens during the acute phase of infection. The lowest concentration of SARS-CoV-2 viral copies this assay can detect is 131 copies/mL. A negative result does not preclude SARS-Cov-2 infection and should not be used as the sole basis for treatment or other patient  management decisions. A negative result may occur with  improper specimen collection/handling, submission of specimen other than nasopharyngeal swab, presence of viral mutation(s) within the areas targeted by this assay, and inadequate number of viral copies (<131 copies/mL). A negative result must be combined with clinical observations, patient history, and epidemiological information. The expected result is Negative.  Fact Sheet for Patients:  https://www.moore.com/  Fact Sheet for Healthcare Providers:  https://www.young.biz/  This test is no t yet approved or cleared by the [place] FDA and  has been authorized for detection and/or diagnosis of SARS-CoV-2 by FDA under an Emergency Use Authorization (EUA). This EUA will remain  in effect (meaning this test can be used) for the duration of the COVID-19 declaration under Section 564(b)(1) of the Act, 21 U.S.C. section 360bbb-3(b)(1), unless the  authorization is terminated or revoked sooner.     Influenza A by PCR NEGATIVE NEGATIVE Final   Influenza B by PCR NEGATIVE NEGATIVE Final    Comment: (NOTE) The Xpert Xpress SARS-CoV-2/FLU/RSV assay is intended as an aid in  the diagnosis of influenza from Nasopharyngeal swab specimens and  should not be used as a sole basis for treatment. Nasal washings and  aspirates are unacceptable for Xpert Xpress SARS-CoV-2/FLU/RSV  testing.  Fact Sheet for Patients: https://www.moore.com/  Fact Sheet for Healthcare Providers: https://www.young.biz/  This test is not yet approved or cleared by the [place] FDA and  has been authorized for detection and/or diagnosis of SARS-CoV-2 by  FDA under an Emergency Use Authorization (EUA). This EUA will remain  in effect (meaning this test can be used) for the duration of the  Covid-19 declaration under Section 564(b)(1) of the Act, 21  U.S.C. section 360bbb-3(b)(1),  unless the authorization is  terminated or revoked. Performed at [facility], [address] [place], [place] [address]          Radiology Studies: DG Chest 2 View  Result Date: 02/29/2020 CLINICAL DATA:  Chest pain EXAM: CHEST - 2 VIEW COMPARISON:  05/26/2019 FINDINGS: The heart size and mediastinal contours are stable. No focal airspace consolidation, pleural effusion, or pneumothorax. The visualized skeletal structures are unremarkable. IMPRESSION: No active cardiopulmonary disease. Electronically Signed   By: Duanne Guess D.O.   On: 02/29/2020 17:03        Scheduled Meds: . aspirin EC  81 mg Oral Daily  . clopidogrel  75 mg Oral Daily  . DULoxetine  30 mg Oral BID  . ezetimibe  10 mg Oral q1800  . gabapentin  100 mg Oral TID  . isosorbide mononitrate  30 mg Oral Daily  . lisinopril  20 mg Oral Daily  . metoprolol tartrate  25 mg Oral BID  . pantoprazole  40 mg Oral Daily  . potassium chloride  20 mEq Oral BID  . rosuvastatin  40 mg Oral q1800  . terbinafine  1 application Topical BID  . torsemide  20 mg Oral Daily   Continuous Infusions: . heparin 1,400 Units/hr (03/01/20 1052)     LOS: 0 days    Time spent: 35 minutes    Kenia Teagarden Sherryll Burger, MD [facility] Pager [phone]  If 7PM-7AM, please contact night-coverage [email or web address] Password [email or web address] 03/01/2020, 1:28 PM

## 2020-03-01 NOTE — Consult Note (Signed)
Cardiology Consultation:   Patient ID: Bryce [name] MRN: [id number]; DOB: Jun 02, 1966  Admit date: 02/29/2020 Date of Consult: 03/01/2020  Primary Care Provider: Langston Reusing, NP Primary Cardiologist: Ida Rogue, MD  Primary Electrophysiologist:  None    Patient Profile:   Bryce [name] is a 53 y.o. male with a hx of CAD s/p prior LAD and left circumflex stenting, HFrEF secondary to ICM, hypertension, hyperlipidemia, tobacco use, and who is being seen today for the evaluation of UA at the request of Dr. Manuella Ghazi.  History of Present Illness:   Bryce [name] is a 54 year old male with PMH as above.  He has been followed in our office in the CHF clinic.He has a history of CAD s/p prior LAD and left circumflex stenting, HFrEF secondary to ICM, hypertension, hyperlipidemia, and tobacco use.  His cardiac history dates back to 11/2010, when he suffered an non-NSTEMI and underwent PCI/BMS to the LAD.    In 02/2017, he suffered a lateral ST elevation myocardial infarction and underwent PCI/DES to the mid and distal left circumflex.  Echo at that time showed mild LV dysfunction with EF 45 to 50%.    Nuclear stress testing in 11/2018 showed no significant ischemia with a fixed apical anterior/apical defect.    Echo 04/2019 showed EF 45 to 50% without significant valvular abnormalities.  In early 2021, his Lasix was transitioned to torsemide in the setting of ongoing lower extremity swelling.  He was seen in the office 12/2019, at which point he noted episodic chest pain that occurred during sleep and would last several seconds without spontaneous resolution.  He also noted awakening with dyspnea on several occasions.  Despite these nocturnal symptoms, he continued to walk in his yard on a daily basis without anginal symptoms.  He felt the torsemide is working well.  Weight 255 pounds, decreased 5 pounds when compared with 06/2019.  Given his lack of exertional anginal symptoms, further  ischemic testing was deferred at that time.  With a longstanding history of poor sleep, he was referred to pulmonary for evaluation of sleep study.  He was seen in the [facility] CHF clinic earlier 02/29/20 for follow-up and reported severe substernal chest pain that occurred on 02/23/20.  In the setting, he was scheduled for an appointment with Christell Faith, PA-C 02/29/2020.  EKG and labs were not obtained at that time. During his later clinic visit on 9/28, he reported severe substernal chest pain on the evening of 9/22, waking him from sleep, rated 5/10 in severity, and lasting for several hours in duration.  He took 2 sublingual nitro with minimal sx improvement.  He noted sporadic increases in CP that would be rated 8/10 during his episode.  He also noted DOE, fatigue, and diaphoresis.  Pain was not rated as severe as his prior STEMI.  At the time of his visit, he initially denied chest pain and reported ongoing fatigue, persistent since his prior episode as described above.  When walking the length of the medical arts building, he did note some increased fatigue, DOE, and mild left-sided chest pain.  Towards the end of his visit, he also noted recurrent chest pain at rest.  This was further described as left-sided soreness that was not reproducible to palpation.  Clinic EKG showed NSR, 81 bpm, new left bundle branch block when compared with previous or most recent tracing but present on review of all prior EKGs present 08/07/2018.  EKG was reviewed with the STEMI team during his clinic visit  and determined not to be an active STEMI code; however, he was sent to the ED with recommendation for diagnostic cardiac catheterization prior to discharge.  During consultation today, he denies any current chest pain but reports ongoing substernal chest pain that has waxed and waned since 02/23/2020.  The pain is described as sharp bolts that occur intermittently at both rest and with exertion, as well as intermittent  diaphoresis.  In addition, he has continued to note frequent episodes of PND, as well as DOE.  He has noticed decreased energy and exercise tolerance.  He has noted racing heart rate and palpitations, which is concerning for him, because his wife has atrial fibrillation and he wonders if he to has atrial fibrillation.  He notes stable 3 pillow orthopnea and denies any increased lower extremity edema.  He reports medication compliance.  He is still smoking tobacco at 1 pack every 2 days and occasionally smoking marijuana.  He denies any signs or symptoms of bleeding.   Since in the emergency department, repeat EKG has shown initially ongoing LBBB as seen in clinic. Repeat EKG yesterday 9/28 then showed resolution of LBBB but T wave inversion across the precordial leads, consistent with Wellen's sign, as well as T wave inversion in the inferior leads and poor conduction also noted through the inferior leads.  Subsequent EKGs have been ordered and pending.  High-sensitivity troponin has been negative at 6  4.  Subsequent high-sensitivity troponin troponin has been ordered and pending.  BNP 20.2.  Renal function stable with creatinine 0.94 and BUN 13 and potassium 3.9.  LFTs WNL.  He is not anemic with hemoglobin 14.9 and hematocrit 43.6.  Cholesterol was rechecked with total cholesterol 102 and LDL of at goal of below 70 and 46.  Current vitals stable with heart rate 71, BP 124/77.  Weight 117.    Of note, telemetry reviewed and showed an episode of atrial fibrillation yesterday 9/28 at approximately 7 PM.  This is a new diagnosis for him.  He was reportedly symptomatic with his atrial fibrillation at that time.    Since in the ED, he was started on IVF at 100cc/hour. This was discontinued in the room due to reduced renal function.    Heart Pathway Score:     Past Medical History:  Diagnosis Date  . ASD (atrial septal defect)    a. 04/2019 Echo: small ASD w/ predominantly L->R shunting.  . Coronary artery  disease    a. 11/2010 NSTEMI/PCI: BMS -->LAD; b. 02/2017 Lateral STEMI/PCI: LM nl, LAD patent stent, LCX 100 (3.0x23 Xience Alpine DES), RCA 93m EF 50%; c. 11/2018 MV: Fixed apical and periapical defect (? over-processing). No significant ischemia. EF 48%.  .Marland KitchenGERD (gastroesophageal reflux disease)   . HFmrEF (heart failure with mid-range ejection fraction) ([place]    a. 04/2019 Echo: EF 45-50%, mild LVH. Mildly dil LA. Small ASA w/ predominantly L->R shunting.  .Marland KitchenHyperlipidemia LDL goal <70   . Hypertension   . Ischemic cardiomyopathy    a. 04/2019 Echo: EF 45-50%.  .Marland KitchenLBBB (left bundle branch block)   . MI ([place]ocardial infarction) ([facility] [place]   . Migraine     Past Surgical History:  Procedure Laterality Date  . CARDIAC CATHETERIZATION N/A 12/28/2014   Procedure: Left Heart Cath and Coronary Angiography;  Surgeon: DYolonda Kida MD;  [facility];  Service: Cardiovascular;  Laterality: N/A;  . CORONARY ANGIOPLASTY     Non-ST elevation MI status post stenting within bare-metal  stent of the 75% lesion of the LAD, 11/13/2010.   . CORONARY STENT INTERVENTION N/A 02/02/2017   Procedure: CORONARY/GRAFT ACUTE MI REVASCULARIZATION;  Surgeon: Yolonda Kida, MD;  Location: [place] [facility];  Service: Cardiovascular;  Laterality: N/A;  . LEFT HEART CATH AND CORONARY ANGIOGRAPHY N/A 02/02/2017   Procedure: LEFT HEART CATH AND CORONARY ANGIOGRAPHY;  Surgeon: Yolonda Kida, MD;  Location: [place] [facility];  Service: Cardiovascular;  Laterality: N/A;     Home Medications:  Prior to Admission medications   Medication Sig Start Date End Date Taking? Authorizing Provider  aspirin EC 81 MG EC tablet Take 1 tablet (81 mg total) by mouth daily. 02/05/17  Yes Epifanio Lesches, MD  aspirin-acetaminophen-caffeine (EXCEDRIN MIGRAINE) [phone] MG tablet Take 1 tablet by mouth daily as needed for headache. Patient taking differently: Take 2 tablets by mouth daily as needed for headache.   01/06/19  Yes Iloabachie, Chioma E, NP  Aspirin-Acetaminophen-Caffeine 520-260-32.5 MG PACK Take 1 tablet by mouth every 8 (eight) hours as needed for migraine. Patient takes 2-3 times daily at migraine onset   Yes [provider]  clopidogrel (PLAVIX) 75 MG tablet TAKE ONE TABLET BY MOUTH EVERY DAY 02/29/20  Yes Iloabachie, Chioma E, NP  Docosanol (ABREVA) 10 % CREA Apply 1 application topically 2 (two) times daily. Patient taking differently: Apply 1 application topically 2 (two) times daily as needed.  01/15/18  Yes Dustin Flock, MD  DULoxetine (CYMBALTA) 30 MG capsule Take 1 capsule (30 mg total) by mouth daily. Patient taking differently: Take 30 mg by mouth 2 (two) times daily.  12/16/19 02/29/20 Yes Iloabachie, Chioma E, NP  ezetimibe (ZETIA) 10 MG tablet TAKE ONE TABLET BY MOUTH EVERY DAY 01/13/20  Yes Gollan, Kathlene November, MD  gabapentin (NEURONTIN) 100 MG capsule TAKE ONE CAPSULE BY MOUTH 3 TIMES A DAY 02/22/20  Yes Iloabachie, Chioma E, NP  lisinopril (ZESTRIL) 20 MG tablet Take 1 tablet (20 mg total) by mouth daily. 02/15/20  Yes Iloabachie, Chioma E, NP  metoprolol tartrate (LOPRESSOR) 25 MG tablet TAKE ONE TABLET BY MOUTH 2 TIMES A DAY 06/17/19  Yes Iloabachie, Chioma E, NP  nitroGLYCERIN (NITROSTAT) 0.4 MG SL tablet Place 1 tablet (0.4 mg total) under the tongue every 5 (five) minutes x 3 doses as needed for chest pain. 04/06/19  Yes Dunn, Ryan M, PA-C  pantoprazole (PROTONIX) 40 MG tablet TAKE ONE TABLET BY MOUTH EVERY DAY 10/28/19  Yes Iloabachie, Chioma E, NP  potassium chloride (KLOR-CON) 10 MEQ tablet Take 2 tablets (20 mEq total) by mouth 2 (two) times daily. 02/15/20  Yes Iloabachie, Chioma E, NP  rosuvastatin (CRESTOR) 40 MG tablet Take 1 tablet (40 mg total) by mouth daily. 04/07/19 10/17/20 Yes Dunn, Areta Haber, PA-C  terbinafine (ATHLETES FOOT AF) 1 % cream Apply 1 application topically 2 (two) times daily. Patient taking differently: Apply 1 application topically 2 (two) times daily  as needed.  05/26/19  Yes Merlyn Lot, MD  torsemide (DEMADEX) 20 MG tablet Take 1 tablet (20 mg total) by mouth 2 (two) times daily. Patient taking differently: Take 20 mg by mouth. 61m daily and additional 272mPM PRN 06/07/19 10/17/20 Yes Gollan, TiKathlene NovemberMD  Blood Pressure KIT 1 kit by Does not apply route daily. 04/01/19   Iloabachie, Chioma E, NP    Inpatient Medications: Scheduled Meds: . aspirin EC  81 mg Oral Daily  . clopidogrel  75 mg Oral Daily  . DULoxetine  30 mg Oral BID  .  enoxaparin (LOVENOX) injection  40 mg Subcutaneous Q24H  . ezetimibe  10 mg Oral q1800  . gabapentin  100 mg Oral TID  . lisinopril  20 mg Oral Daily  . metoprolol tartrate  25 mg Oral BID  . pantoprazole  40 mg Oral Daily  . potassium chloride  20 mEq Oral BID  . rosuvastatin  40 mg Oral q1800  . terbinafine  1 application Topical BID  . torsemide  20 mg Oral Daily   Continuous Infusions: . sodium chloride 100 mL/hr at 03/01/20 0148   PRN Meds: acetaminophen, ALPRAZolam, morphine injection, nitroGLYCERIN, ondansetron (ZOFRAN) IV, zolpidem  Allergies:    Allergies  Allergen Reactions  . Atorvastatin     Myalgias    Social History:   Social History   Socioeconomic History  . Marital status: Single    Spouse name: Not on file  . Number of children: 2  . Years of education: Not on file  . Highest education level: Not on file  Occupational History  . Occupation: Proofreader    Comment: Currently on disability/unemployed  Tobacco Use  . Smoking status: Current Some Day Smoker    Packs/day: 1.00    Years: 5.00    Pack years: 5.00    Types: Cigarettes    Last attempt to quit: 01/05/2017    Years since quitting: 3.1  . Smokeless tobacco: Never Used  . Tobacco comment: Typically 1 pack a week but recently sometimes 2 packs 10/18/19  Vaping Use  . Vaping Use: Never used  Substance and Sexual Activity  . Alcohol use: Not Currently    Alcohol/week: 1.0 standard drink     Types: 1 Cans of beer per week    Comment: Social Drinker  . Drug use: Yes    Types: Marijuana    Comment: everyday  . Sexual activity: Yes  Other Topics Concern  . Not on file  Social History Narrative   Social determinants screening completed. HS   Social Determinants of Health   Financial Resource Strain: High Risk  . Difficulty of Paying Living Expenses: Hard  Food Insecurity: No Food Insecurity  . Worried About Charity fundraiser in the Last Year: Never true  . Ran Out of Food in the Last Year: Never true  Transportation Needs: No Transportation Needs  . Lack of Transportation (Medical): No  . Lack of Transportation (Non-Medical): No  Physical Activity: Sufficiently Active  . Days of Exercise per Week: 5 days  . Minutes of Exercise per Session: 40 min  Stress: Stress Concern Present  . Feeling of Stress : To some extent  Social Connections: Moderately Isolated  . Frequency of Communication with Friends and Family: Three times a week  . Frequency of Social Gatherings with Friends and Family: Three times a week  . Attends Religious Services: More than 4 times per year  . Active Member of Clubs or Organizations: No  . Attends Archivist Meetings: Never  . Marital Status: Separated  Intimate Partner Violence: Not At Risk  . Fear of Current or Ex-Partner: No  . Emotionally Abused: No  . Physically Abused: No  . Sexually Abused: No    Family History:    Family History  Problem Relation Age of Onset  . CAD Father   . Kidney disease Neg Hx   . Diabetes Mellitus II Neg Hx      ROS:  Please see the history of present illness.  Review of Systems  Constitutional:  Positive for diaphoresis and malaise/fatigue.  Respiratory: Positive for shortness of breath and wheezing. Negative for cough, hemoptysis and sputum production.        Ongoing wheezing.  He does have an inhaler but has not yet used it.  Cardiovascular: Positive for chest pain, palpitations,  orthopnea, leg swelling and PND. Negative for claudication.       No current chest pain.  Ongoing substernal chest pain since his initial episode 9/22.  Earlier episode of racing heart rate and palpitations, corresponding to telemetry from 9/28 in the emergency department.  Stable 3 pillow orthopnea.  No reported increase from baseline lower extremity edema.  Ongoing PND.  Gastrointestinal: Negative for blood in stool, constipation, diarrhea, melena, nausea and vomiting.  Genitourinary: Negative for hematuria.  Musculoskeletal: Negative for falls.  Neurological: Positive for weakness. Negative for dizziness and loss of consciousness.  Psychiatric/Behavioral: Positive for substance abuse.       Tobacco use, marijuana use  All other systems reviewed and are negative.   All other ROS reviewed and negative.     Physical Exam/Data:   Vitals:   03/01/20 0300 03/01/20 0615 03/01/20 0730 03/01/20 0731  BP: 127/77     Pulse: 71 72 63   Resp: (!) 21 (!) 33 18   Temp:      SpO2: 97% 96% 96% 95%  Weight:      Height:        Intake/Output Summary (Last 24 hours) at 03/01/2020 0733 Last data filed at 03/01/2020 0419 Gross per 24 hour  Intake --  Output 1150 ml  Net -1150 ml   Last 3 Weights 02/29/2020 02/29/2020 02/29/2020  Weight (lbs) 258 lb 258 lb 258 lb 2 oz  Weight (kg) 117.028 kg 117.028 kg 117.085 kg     Body mass index is 39.23 kg/m.  General: Obese male, no acute distress, lying in bed HEENT: normal Neck: JVD unable to be assessed due to body habitus Vascular: No carotid bruits; radial pulses 2+ bilaterally Cardiac:  normal S1, S2; RRR; no murmur Lungs: +bilateral wheezing Abd: obese, firm, nontender, no hepatomegaly  Ext: 1+ bilateral pretibial edema to the level of the knee Musculoskeletal:  No deformities, BUE and BLE strength normal and equal Skin: warm and dry  Neuro:  No focal abnormalities noted Psych:  Normal affect, slightly anxious  EKG:  The EKG was personally  reviewed and demonstrates: As above, EKG in clinic showed left bundle branch block.  EKG in the emergency department initially showed left bundle branch block and then showed showed resolution of clinic left bundle branch block and T wave inversion across the precordial leads, consistent with Wellens sign.  Inferior T wave inversion and poor conduction noted in inferior leads.  Repeat EKG pending. Telemetry:  Telemetry was personally reviewed and demonstrates: NSR, episode of atrial fibrillation that started in the emergency department 9/28 at approximately 7 PM  Relevant CV Studies: Echo 04/26/2019 1. Left ventricular ejection fraction, by visual estimation, is 45 to  50%. The left ventricle has mildly decreased function. There is mildly  increased left ventricular hypertrophy.  2. Global right ventricle has normal systolic function.The right  ventricular size is normal. No increase in right ventricular wall  thickness.  3. Left atrial size was mildly dilated.  4. Right atrial size was normal.  5. The mitral valve is normal in structure. No evidence of mitral valve  regurgitation. No evidence of mitral stenosis.  6. The tricuspid valve is normal in structure. Tricuspid valve  regurgitation is not demonstrated.  7. The aortic valve is normal in structure. Aortic valve regurgitation is  not visualized. No evidence of aortic valve sclerosis or stenosis.  8. TR signal is inadequate for assessing pulmonary artery systolic  pressure.  9. The inferior vena cava is dilated in size with >50% respiratory  variability, suggesting right atrial pressure of 8 mmHg.  10. Small atrial septal defect with predominantly left to right shunting  across the atrial septum.  11. The interatrial septum was not well visualized.   LHC 02/02/2017  Mid LAD lesion, 0 %stenosed.  Mid RCA lesion, 40 %stenosed.  A STENT XIENCE ALPINE RX 3.0X23 drug eluting stent was successfully placed, and does not  overlap previously placed stent.  Dist Cx lesion, 100 %stenosed.  Post intervention, there is a 0% residual stenosis.  There is mild left ventricular systolic dysfunction.  LV end diastolic pressure is mildly elevated.  The left ventricular ejection fraction is 50-55% by visual estimate. Conclusion Successful PCI and stent to mid to distal circumflex with DES stent 3.023 mm xience Reducing the lesion from 100% down to 0 point TIMI-3 from 0 back to 3.   Laboratory Data:  High Sensitivity Troponin:   Recent Labs  Lab 02/29/20 1553  TROPONINIHS 6     Cardiac EnzymesNo results for input(s): TROPONINI in the last 168 hours. No results for input(s): TROPIPOC in the last 168 hours.  Chemistry Recent Labs  Lab 02/29/20 1553  NA 139  K 3.9  CL 106  CO2 24  GLUCOSE 147*  BUN 13  CREATININE 0.94  CALCIUM 9.1  GFRNONAA >60  GFRAA >60  ANIONGAP 9    Recent Labs  Lab 02/29/20 1553  PROT 7.3  ALBUMIN 4.6  AST 23  ALT 26  ALKPHOS 58  BILITOT 0.8   Hematology Recent Labs  Lab 02/29/20 1553  WBC 6.2  RBC 4.64  HGB 14.9  HCT 43.6  MCV 94.0  MCH 32.1  MCHC 34.2  RDW 13.8  PLT 212   BNP Recent Labs  Lab 02/29/20 1553  BNP 20.2    DDimer No results for input(s): DDIMER in the last 168 hours.   Radiology/Studies:  DG Chest 2 View  Result Date: 02/29/2020 CLINICAL DATA:  Chest pain EXAM: CHEST - 2 VIEW COMPARISON:  05/26/2019 FINDINGS: The heart size and mediastinal contours are stable. No focal airspace consolidation, pleural effusion, or pneumothorax. The visualized skeletal structures are unremarkable. IMPRESSION: No active cardiopulmonary disease. Electronically Signed   By: Davina Poke D.O.   On: 02/29/2020 17:03    Assessment and Plan:   1. CAD with unstable angina: No current CP but reports intermittent nonexertional CP as detailed above in the last 24 hours.  Initial ED EKG showed ongoing LBBB, seen in office same day, and which was new when  compared with most immediate prior tracing but also visualized on tracing 08/2018.  Subsequent EKGs have shown LBBB resolved; however, new deep TWI across his precoridal leads, suggestive of Wellen's sign. Subsequent EKGs are pending. Troponin  6  4. Received ASA 341m once yesterday in the cardiology office.  --Continue to cycle HS-Tn, serial EKGs. --Continue ASA 879m Plavix 7575moday.  Daily CBC. --Continue metoprolol 8m79mD, lisinopril 20mg66mly.  Monitor BP closely. --Continue rosuvastatin 40mg 67mzetia 10mg. 41mntinue IV heparin for UA and Afiib as below / seen on ED telemetry. --Start Imdur 30mg da21m Continue to monitor CP. --Discussed with rounding cardiologist /MD. Current plan is for  LHC tomorrow 9/30 with his primary cardiologist, Dr. Rockey Situ.  --Start heart healthy diet.  N.p.o. after midnight in preparation for cath. --Risks and benefits of cardiac catheterization have been discussed with the patient.  These include bleeding, infection, kidney damage, stroke, heart attack, death.  The patient understands these risks and is willing to proceed.  2. HFrEF (EF 45 to 50%) secondary to ICM: Reports progressive PND, DOE; reports stable orthopnea and LEE.  Current smoker and pending sleep study, which is also considered. He has been receiving IVF at 100cc/hour. Given reduced EF, as well as volume overload appreciated on exam, IVF discontinued in the room. BNP 20.2 on initial labs and before start of fluids. Renal function stable.  --Previous echo as above. IM ordered repeat echo, which can likely be postponed until after cath and transitioned to a limited echo to reassess EF. --Avoid IVF, given reduced EF on echo above.  IVF discontinued this AM. --Continue metoprolol and lisinopril as above for BP support. --Restart torsemide 30m with initial dose to be administered as soon as possible.  3.  ? New Paroxysmal atrial fibrillation: Seen on ED telemetry. He reported being symptomatic around  that time with palpitations.  Also reports occasional palpitations at home over the last several months. No EKG to confirm Afib.  EKGs reviewed and no atrial fibrillation seen on previous tracings. --Currently NSR.  Earlier Afib 9/28, lasting for several hours and without confirmation on EKG. CHA2DS2-VASc score: 3 (CHF, HTN, MI) with recommendation for long-term anticoagulation at discharge to reduce the risk of thromboembolic event.  Will monitor closely on telemetry to evaluate the burden of his atrial fibrillation.  Further recommendations after that time. Begin heparin infusion outlined above. Continue metoprolol as above.   4. HTN: Currently well controlled.  --Continue medications as above.  5. Hyperlipidemia: LDL 46 and at goal of below 70. --Continue rosuvastatin and zetia as above.  5. Tobacco and marijuana use, current: Continues to smoke approximately 1 pack of cigarettes per 2 days.  Also reports intermittent use of THC. Advised cessation, discussed benefits.  He does indicate a desire to quit smoking and discussed providing him with the smoking cessation hotline in the future on his AVS.  Nicotine patch per IM.  For questions or updates, please contact [place] consult [email or web address] for contact info under     Signed, JArvil Chaco PA-C  03/01/2020 7:33 AM

## 2020-03-01 NOTE — Progress Notes (Signed)
Report called to [facility] on 2A

## 2020-03-02 ENCOUNTER — Encounter
Admission: EM | Disposition: A | Discharge status: Home / Self Care | Payer: Self-pay | Source: Home / Self Care | Attending: Internal Medicine

## 2020-03-02 ENCOUNTER — Inpatient Hospital Stay: Admit: 2020-03-02 | Payer: Medicaid Other

## 2020-03-02 ENCOUNTER — Encounter: Payer: Self-pay | Admitting: Internal Medicine

## 2020-03-02 ENCOUNTER — Other Ambulatory Visit: Payer: Self-pay

## 2020-03-02 DIAGNOSIS — Z955 Presence of coronary angioplasty implant and graft: Secondary | ICD-10-CM

## 2020-03-02 DIAGNOSIS — I447 Left bundle-branch block, unspecified: Secondary | ICD-10-CM

## 2020-03-02 DIAGNOSIS — I10 Essential (primary) hypertension: Secondary | ICD-10-CM

## 2020-03-02 DIAGNOSIS — R079 Chest pain, unspecified: Secondary | ICD-10-CM

## 2020-03-02 DIAGNOSIS — E782 Mixed hyperlipidemia: Secondary | ICD-10-CM

## 2020-03-02 DIAGNOSIS — I200 Unstable angina: Secondary | ICD-10-CM

## 2020-03-02 DIAGNOSIS — I255 Ischemic cardiomyopathy: Secondary | ICD-10-CM

## 2020-03-02 DIAGNOSIS — I25110 Atherosclerotic heart disease of native coronary artery with unstable angina pectoris: Principal | ICD-10-CM

## 2020-03-02 HISTORY — PX: LEFT HEART CATH AND CORONARY ANGIOGRAPHY: CATH118249

## 2020-03-02 LAB — BASIC METABOLIC PANEL
Anion gap: 7 (ref 5–15)
BUN: 20 mg/dL (ref 6–20)
CO2: 29 mmol/L (ref 22–32)
Calcium: 8.7 mg/dL — ABNORMAL LOW (ref 8.9–10.3)
Chloride: 103 mmol/L (ref 98–111)
Creatinine, Ser: 1.11 mg/dL (ref 0.61–1.24)
GFR calc Af Amer: >60 mL/min (ref >60)
GFR calc non Af Amer: >60 mL/min (ref >60)
Glucose, Bld: 99 mg/dL (ref 70–99)
Potassium: 4.1 mmol/L (ref 3.5–5.1)
Sodium: 139 mmol/L (ref 135–145)

## 2020-03-02 LAB — CBC
HCT: 42.8 % (ref 39.0–52.0)
Hemoglobin: 14.7 g/dL (ref 13.0–17.0)
MCH: 32 pg (ref 26.0–34.0)
MCHC: 34.3 g/dL (ref 30.0–36.0)
MCV: 93.2 fL (ref 80.0–100.0)
Platelets: 192 10*3/uL (ref 150–400)
RBC: 4.59 MIL/uL (ref 4.22–5.81)
RDW: 13.8 % (ref 11.5–15.5)
WBC: 6.8 10*3/uL (ref 4.0–10.5)
nRBC: 0 % (ref 0.0–0.2)

## 2020-03-02 LAB — GLUCOSE, CAPILLARY
Glucose-Capillary: 95 mg/dL (ref 70–99)
Glucose-Capillary: 88 mg/dL (ref 70–99)

## 2020-03-02 LAB — CARDIAC CATHETERIZATION: Cath EF Quantitative: 45 %

## 2020-03-02 LAB — TROPONIN I (HIGH SENSITIVITY): Troponin I (High Sensitivity): 4 ng/L (ref <18)

## 2020-03-02 SURGERY — LEFT HEART CATH AND CORONARY ANGIOGRAPHY
Anesthesia: Moderate Sedation

## 2020-03-02 MED ORDER — IOHEXOL 300 MG/ML  SOLN
INTRAMUSCULAR | Status: DC | PRN
  Administered 2020-03-02: 100 mL

## 2020-03-02 MED ORDER — LABETALOL HCL 5 MG/ML IV SOLN: 10.0000 mg | INTRAVENOUS | Status: DC | PRN

## 2020-03-02 MED ORDER — HEPARIN (PORCINE) IN NACL 1000-0.9 UT/500ML-% IV SOLN
INTRAVENOUS | Status: DC | PRN
  Administered 2020-03-02: 500 mL

## 2020-03-02 MED ORDER — ONDANSETRON HCL 4 MG/2ML IJ SOLN: 4.0000 mg | Freq: Four times a day (QID) | INTRAMUSCULAR | Status: DC | PRN

## 2020-03-02 MED ORDER — SODIUM CHLORIDE 0.9 % IV SOLN
INTRAVENOUS | Status: AC | PRN
  Administered 2020-03-02: 100 mL/h via INTRAVENOUS

## 2020-03-02 MED ORDER — SODIUM CHLORIDE 0.9 % IV SOLN: 250.0000 mL | INTRAVENOUS | Status: DC | PRN

## 2020-03-02 MED ORDER — ASPIRIN-ACETAMINOPHEN-CAFFEINE 250-250-65 MG PO TABS
2.0000 | ORAL_TABLET | Freq: Four times a day (QID) | ORAL | Status: DC | PRN
  Administered 2020-03-02: 2 via ORAL
  Filled 2020-03-02 (×2): qty 2

## 2020-03-02 MED ORDER — SODIUM CHLORIDE 0.9% FLUSH: 3.0000 mL | Freq: Two times a day (BID) | INTRAVENOUS | Status: DC

## 2020-03-02 MED ORDER — MIDAZOLAM HCL 2 MG/2ML IJ SOLN
INTRAMUSCULAR | Status: AC
  Filled 2020-03-02: qty 2

## 2020-03-02 MED ORDER — SODIUM CHLORIDE 0.9% FLUSH: 3.0000 mL | INTRAVENOUS | Status: DC | PRN

## 2020-03-02 MED ORDER — HYDRALAZINE HCL 20 MG/ML IJ SOLN: 10.0000 mg | INTRAMUSCULAR | Status: DC | PRN

## 2020-03-02 MED ORDER — FENTANYL CITRATE (PF) 100 MCG/2ML IJ SOLN
INTRAMUSCULAR | Status: AC
  Filled 2020-03-02: qty 2

## 2020-03-02 MED ORDER — ISOSORBIDE MONONITRATE ER 30 MG PO TB24: 30.0000 mg | ORAL_TABLET | Freq: Every day | ORAL | 0 refills | Status: DC

## 2020-03-02 MED ORDER — TORSEMIDE 20 MG PO TABS: 20.0000 mg | ORAL_TABLET | Freq: Every day | ORAL | Status: DC

## 2020-03-02 MED ORDER — HEPARIN (PORCINE) IN NACL 1000-0.9 UT/500ML-% IV SOLN
INTRAVENOUS | Status: AC
  Filled 2020-03-02: qty 1000

## 2020-03-02 MED ORDER — FENTANYL CITRATE (PF) 100 MCG/2ML IJ SOLN
INTRAMUSCULAR | Status: DC | PRN
Start: 2020-03-02 — End: 2020-03-02
  Administered 2020-03-02 (×2): 25 ug via INTRAVENOUS

## 2020-03-02 MED ORDER — MIDAZOLAM HCL 2 MG/2ML IJ SOLN
INTRAMUSCULAR | Status: DC | PRN
  Administered 2020-03-02 (×2): 1 mg via INTRAVENOUS

## 2020-03-02 MED ORDER — SODIUM CHLORIDE 0.9 % WEIGHT BASED INFUSION: 1.0000 mL/kg/h | INTRAVENOUS | Status: DC

## 2020-03-02 MED ORDER — LISINOPRIL 20 MG PO TABS: 20.0000 mg | ORAL_TABLET | Freq: Every day | ORAL | Status: DC

## 2020-03-02 MED ORDER — ACETAMINOPHEN 325 MG PO TABS: 650.0000 mg | ORAL_TABLET | ORAL | Status: DC | PRN

## 2020-03-02 SURGICAL SUPPLY — 9 items
CATH INFINITI 5FR ANG PIGTAIL (CATHETERS) ×3 IMPLANT
CATH INFINITI 5FR JL4 (CATHETERS) ×3 IMPLANT
CATH INFINITI JR4 5F (CATHETERS) ×3 IMPLANT
DEVICE CLOSURE MYNXGRIP 5F (Vascular Products) ×3 IMPLANT
KIT MANI 3VAL PERCEP (MISCELLANEOUS) ×3 IMPLANT
NEEDLE PERC 18GX7CM (NEEDLE) ×3 IMPLANT
PACK CARDIAC CATH (CUSTOM PROCEDURE TRAY) ×3 IMPLANT
SHEATH AVANTI 5FR X 11CM (SHEATH) ×3 IMPLANT
WIRE GUIDERIGHT .035X150 (WIRE) ×3 IMPLANT

## 2020-03-02 NOTE — Progress Notes (Signed)
Patient given discharge instructions, IV's taken out and tele monitor off. Instructions on femoral site care and new medications given. Patient verbalized understanding without any questions or concerns. Patient well educated on heart failure. Patient going home in stable condition.

## 2020-03-02 NOTE — Plan of Care (Signed)

## 2020-03-02 NOTE — Progress Notes (Signed)
Progress Note  Patient Name: Bryce [name] Date of Encounter: 03/02/2020  Primary Cardiologist: Julien Nordmann, MD   Subjective   No chest pain, racing heart rate, palpitations, dizziness.  Edema improved from yesterday.  Wheezing improved from yesterday.  Is eager for his cardiac catheterization, though does admit he is somewhat nervous for it.   Scheduled for cardiac catheterization at approximately 9:30 AM today 9/30 with Dr. Mariah Milling.  Inpatient Medications    Scheduled Meds: . aspirin EC  81 mg Oral Daily  . clopidogrel  75 mg Oral Daily  . DULoxetine  30 mg Oral BID  . enoxaparin (LOVENOX) injection  40 mg Subcutaneous Q24H  . ezetimibe  10 mg Oral q1800  . gabapentin  100 mg Oral TID  . isosorbide mononitrate  30 mg Oral Daily  . [START ON 03/03/2020] lisinopril  20 mg Oral Daily  . metoprolol tartrate  25 mg Oral BID  . pantoprazole  40 mg Oral Daily  . potassium chloride  20 mEq Oral BID  . rosuvastatin  40 mg Oral q1800  . sodium chloride flush  3 mL Intravenous Q12H  . terbinafine  1 application Topical BID  . [START ON 03/03/2020] torsemide  20 mg Oral Daily   Continuous Infusions: . sodium chloride    . sodium chloride 10 mL/hr at 03/02/20 0618   PRN Meds: sodium chloride, ALPRAZolam, aspirin-acetaminophen-caffeine, morphine injection, nitroGLYCERIN, ondansetron (ZOFRAN) IV, sodium chloride flush, zolpidem   Vital Signs    Vitals:   03/01/20 1843 03/02/20 0431 03/02/20 0500 03/02/20 0731  BP: (!) 90/49 109/78  110/76  Pulse: (!) 53 (!) 58  (!) 57  Resp: 19 17  19   Temp: 98.3 F (36.8 C) (!) 97.5 F (36.4 C)  98 F (36.7 C)  TempSrc:  Oral  Oral  SpO2: 94% 96%  95%  Weight:   118.1 kg   Height:        Intake/Output Summary (Last 24 hours) at 03/02/2020 0919 Last data filed at 03/02/2020 0618 Gross per 24 hour  Intake 11.08 ml  Output 800 ml  Net -788.92 ml   Last 3 Weights 03/02/2020 02/29/2020 02/29/2020  Weight (lbs) 260 lb 5.8 oz 258 lb 258  lb  Weight (kg) 118.1 kg 117.028 kg 117.028 kg      Telemetry    NSR, SB overnight- Personally Reviewed  ECG    No new tracings- Personally Reviewed  Physical Exam   GEN: No acute distress.  Lying in bed and watching TV. Neck:  JVD difficult to assess due to body habitus Cardiac: RRR, no murmurs, rubs, or gallops.  Respiratory: Clear to auscultation bilaterally.  Slight and greatly improved wheezing from yesterday 9/29. GI: Soft, nontender, non-distended  MS: No lower extremity edema (improved from yesterday 9/29); No deformity. Neuro:  Nonfocal  Psych: Normal affect   Labs    High Sensitivity Troponin:   Recent Labs  Lab 02/29/20 1553 03/01/20 0615 03/01/20 0834 03/01/20 1029 03/02/20 0649  TROPONINIHS 6 4 3 4 4       Chemistry Recent Labs  Lab 02/29/20 1553 03/02/20 0815  NA 139 139  K 3.9 4.1  CL 106 103  CO2 24 29  GLUCOSE 147* 99  BUN 13 20  CREATININE 0.94 1.11  CALCIUM 9.1 8.7*  PROT 7.3  --   ALBUMIN 4.6  --   AST 23  --   ALT 26  --   ALKPHOS 58  --   BILITOT 0.8  --  GFRNONAA >60 >60  GFRAA >60 >60  ANIONGAP 9 7     Hematology Recent Labs  Lab 02/29/20 1553 03/02/20 0815  WBC 6.2 6.8  RBC 4.64 4.59  HGB 14.9 14.7  HCT 43.6 42.8  MCV 94.0 93.2  MCH 32.1 32.0  MCHC 34.2 34.3  RDW 13.8 13.8  PLT 212 192    BNP Recent Labs  Lab 02/29/20 1553  BNP 20.2     DDimer No results for input(s): DDIMER in the last 168 hours.   Radiology    DG Chest 2 View  Result Date: 02/29/2020 CLINICAL DATA:  Chest pain EXAM: CHEST - 2 VIEW COMPARISON:  05/26/2019 FINDINGS: The heart size and mediastinal contours are stable. No focal airspace consolidation, pleural effusion, or pneumothorax. The visualized skeletal structures are unremarkable. IMPRESSION: No active cardiopulmonary disease. Electronically Signed   By: Duanne Guess D.O.   On: 02/29/2020 17:03    Cardiac Studies   Echo 04/26/2019 1. Left ventricular ejection fraction,  by visual estimation, is 45 to  50%. The left ventricle has mildly decreased function. There is mildly  increased left ventricular hypertrophy.  2. Global right ventricle has normal systolic function.The right  ventricular size is normal. No increase in right ventricular wall  thickness.  3. Left atrial size was mildly dilated.  4. Right atrial size was normal.  5. The mitral valve is normal in structure. No evidence of mitral valve  regurgitation. No evidence of mitral stenosis.  6. The tricuspid valve is normal in structure. Tricuspid valve  regurgitation is not demonstrated.  7. The aortic valve is normal in structure. Aortic valve regurgitation is  not visualized. No evidence of aortic valve sclerosis or stenosis.  8. TR signal is inadequate for assessing pulmonary artery systolic  pressure.  9. The inferior vena cava is dilated in size with >50% respiratory  variability, suggesting right atrial pressure of 8 mmHg.  10. Small atrial septal defect with predominantly left to right shunting  across the atrial septum.  11. The interatrial septum was not well visualized.   LHC 02/02/2017  Mid LAD lesion, 0 %stenosed.  Mid RCA lesion, 40 %stenosed.  A STENT XIENCE ALPINE RX 3.0X23 drug eluting stent was successfully placed, and does not overlap previously placed stent.  Dist Cx lesion, 100 %stenosed.  Post intervention, there is a 0% residual stenosis.  There is mild left ventricular systolic dysfunction.  LV end diastolic pressure is mildly elevated.  The left ventricular ejection fraction is 50-55% by visual estimate. Conclusion Successful PCI and stent to mid to distal circumflex with DES stent 3.023 mm xience  Patient Profile     54 y.o. male  with hx of CAD s/p prior LAD and left circumflex stenting, HFrEF secondary to ICM, hypertension, hyperlipidemia, tobacco use, and who is being seen today for the evaluation of UA  after seen in clinic 9/28 for CP with new  left bundle and recommendation to go to the emergency department and with plan for LHC today 03/02/20 after subsequent EKG consistent with Wellens sign.  Assessment & Plan    1. CAD with unstable angina: No current CP but reports intermittent nonexertional CP.  LBBB, seen in office, and also visualized on tracing 08/2018.  Subsequent EKG with new deep TWI across precoridal leads, suggestive of Wellen's sign.  Troponin negative.  --Continue ASA 81mg , Plavix 75mg  today.  Daily CBC. --Continue metoprolol 25mg  BID,  --Holding lisinopril 20mg  daily before cath.  Monitor BP closely. --Continue rosuvastatin  40mg  and zetia 10mg . --Continue IV heparin. --Continue Imdur 30mg  daily. --NPO for LHC today 9/30 with Dr. .  --Further recommendations following cath.  2. HFrEF (EF 45 to 50%) secondary to ICM: Reported progressive PND, DOE; reports stable orthopnea and LEE.  Current smoker and pending sleep study, which is also considered. --Previous echo as above with EF 45 to 50%. --Continue metoprolol  --Continue to monitor daily BMET.  Renal function with slight bump in creatinine but relatively stable. --Euvolemic on exam.  Volume status improved from yesterday. --Torsemide and lisinopril held this a.m, given upcoming cath and will resume after LHC. --Further recommendations following cath.  3.  ? New Paroxysmal atrial fibrillation: Seen on ED telemetry. He reported being symptomatic around that time with palpitations.  Also reports occasional palpitations at home over the last several months. No EKG to confirm Afib.  EKGs reviewed and no atrial fibrillation seen on previous tracings.  No subsequent A. fib seen on telemetry since admitted to 2A. --Consider outpatient cardiac monitoring at discharge if no further atrial fib or atrial tachycardia seen on telemetry this admission. CHA2DS2-VASc score: 3 (CHF, HTN, MI) with recommendation for long-term anticoagulation at discharge to reduce the risk of  thromboembolic event if clear evidence of atrial fibrillation.  Will monitor closely on telemetry to evaluate the burden of his atrial fibrillation.  Continue on IV heparin for now. Continue metoprolol as above.  Further recommendations at discharge if needed.   4. HTN: Currently well controlled.  --Continue medications as above.  5. Hyperlipidemia: LDL 46 and at goal of below 70. --Continue rosuvastatin and zetia as above.  5. Tobacco and marijuana use, current: Continues to smoke approximately 1 pack of cigarettes per 2 days.  Also reports intermittent use of THC. Advised cessation, discussed benefits.  He does indicate a desire to quit smoking and discussed providing him with the smoking cessation hotline in the future on his AVS.  Nicotine patch per IM.  For questions or updates, please contact [facility] Please consult [email or web address] for contact info under        Signed, , PA-C  03/02/2020, 9:19 AM

## 2020-03-02 NOTE — Discharge Instructions (Signed)
Coronary Artery Disease, Male Coronary artery disease (CAD) is a condition in which the arteries that lead to the heart (coronary arteries) become narrow or blocked. The narrowing or blockage can lead to decreased blood flow to the heart. Prolonged reduced blood flow can cause a heart attack (myocardial infarction or MI). This condition may also be called coronary heart disease. Because CAD is the leading cause of death in men, it is important to understand what causes this condition and how it is treated. What are the causes? CAD is most often caused by atherosclerosis. This is the buildup of fat and cholesterol (plaque) on the inside of the arteries. Over time, the plaque may narrow or block the artery, reducing blood flow to the heart. Plaque can also become weak and break off within a coronary artery and cause a sudden blockage. Other less common causes of CAD include:  A blood clot or a piece of a blood clot or other substance that blocks the flow of blood in a coronary artery (embolism).  A tearing of the artery (spontaneous coronary artery dissection).  An enlargement of an artery (aneurysm).  Inflammation (vasculitis) in the artery wall. What increases the risk? The following factors may make you more likely to develop this condition:  Age. Men over age 45 are at a greater risk of CAD.  Family history of CAD.  Gender. Men often develop CAD earlier in life than women.  High blood pressure (hypertension).  Diabetes.  High cholesterol levels.  Tobacco use.  Excessive alcohol use.  Lack of exercise.  A diet high in saturated and trans fats, such as fried food and processed meat. Other possible risk factors include:  High stress levels.  Depression.  Obesity.  Sleep apnea. What are the signs or symptoms? Many people do not have any symptoms during the early stages of CAD. As the condition progresses, symptoms may include:  Chest pain (angina). The pain can: ? Feel  like crushing or squeezing, or like a tightness, pressure, fullness, or heaviness in the chest. ? Last more than a few minutes or can stop and recur. The pain tends to get worse with exercise or stress and to fade with rest.  Pain in the arms, neck, jaw, ear, or back.  Unexplained heartburn or indigestion.  Shortness of breath.  Nausea or vomiting.  Sudden light-headedness.  Sudden cold sweats.  Fluttering or fast heartbeat (palpitations). How is this diagnosed? This condition is diagnosed based on:  Your family and medical history.  A physical exam.  Tests, including: ? A test to check the electrical signals in your heart (electrocardiogram). ? Exercise stress test. This looks for signs of blockage when the heart is stressed with exercise, such as running on a treadmill. ? Pharmacologic stress test. This test looks for signs of blockage when the heart is being stressed with a medicine. ? Blood tests. ? Coronary angiogram. This is a procedure to look at the coronary arteries to see if there is any blockage. During this test, a dye is injected into your arteries so they appear on an X-ray. ? Coronary artery CT scan. This CT scan helps detect calcium deposits in your coronary arteries. Calcium deposits are an indicator of CAD. ? A test that uses sound waves to take a picture of your heart (echocardiogram). ? Chest X-ray. How is this treated? This condition may be treated by:  Healthy lifestyle changes to reduce risk factors.  Medicines such as: ? Antiplatelet medicines and blood-thinning medicines, such   as aspirin. These help to prevent blood clots. ? Nitroglycerin. ? Blood pressure medicines. ? Cholesterol-lowering medicine.  Coronary angioplasty and stenting. During this procedure, a thin, flexible tube is inserted through a blood vessel and into a blocked artery. A balloon or similar device on the end of the tube is inflated to open up the artery. In some cases, a small,  mesh tube (stent) is inserted into the artery to keep it open.  Coronary artery bypass surgery. During this surgery, veins or arteries from other parts of the body are used to create a bypass around the blockage and allow blood to reach your heart. Follow these instructions at home: Medicines  Take over-the-counter and prescription medicines only as told by your health care provider.  Do not take the following medicines unless your health care provider approves: ? NSAIDs, such as ibuprofen, naproxen, or celecoxib. ? Vitamin supplements that contain vitamin A, vitamin E, or both. Lifestyle  Follow an exercise program approved by your health care provider. Aim for 150 minutes of moderate exercise or 75 minutes of vigorous exercise each week.  Maintain a healthy weight or lose weight as approved by your health care provider.  Learn to manage stress or try to limit your stress. Ask your health care provider for suggestions if you need help.  Get screened for depression and seek treatment, if needed.  Do not use any products that contain nicotine or tobacco, such as cigarettes, e-cigarettes, and chewing tobacco. If you need help quitting, ask your health care provider.  Do not use illegal drugs. Eating and drinking   Follow a heart-healthy diet. A dietitian can help educate you about healthy food options and changes. In general, eat plenty of fruits and vegetables, lean meats, and whole grains.  Avoid foods high in: ? Sugar. ? Salt (sodium). ? Saturated fat, such as processed or fatty meat. ? Trans fat, such as fried foods.  Use healthy cooking methods such as roasting, grilling, broiling, baking, poaching, steaming, or stir-frying.  Do not drink alcohol if your health care provider tells you not to drink.  If you drink alcohol: ? Limit how much you have to 0-2 drinks per day. ? Be aware of how much alcohol is in your drink. In the [place] one drink equals one 12 oz bottle of beer  (355 mL), one 5 oz glass of wine (148 mL), or one 1 oz glass of hard liquor (44 mL). General instructions  Manage any other health conditions, such as hypertension and diabetes. These conditions affect your heart.  Your health care provider may ask you to monitor your blood pressure. Ideally, your blood pressure should be below 130/80.  Keep all follow-up visits as told by your health care provider. This is important. Get help right away if:  You have pain in your chest, neck, ear, arm, jaw, stomach, or back that: ? Lasts more than a few minutes. ? Is recurring. ? Is not relieved by taking medicine under your tongue (sublingual nitroglycerin).  You have profuse sweating without cause.  You have unexplained: ? Heartburn or indigestion. ? Shortness of breath or difficulty breathing. ? Fluttering or fast heartbeat (palpitations). ? Nausea or vomiting. ? Fatigue. ? Feelings of nervousness or anxiety. ? Weakness. ? Diarrhea.  You have sudden light-headedness or dizziness.  You faint.  You feel like hurting yourself or think about taking your own life. These symptoms may represent a serious problem that is an emergency. Do not wait to see if the   symptoms will go away. Get medical help right away. Call your local emergency services (911 in the [place] Do not drive yourself to the hospital. Summary  Coronary artery disease (CAD) is a condition in which the arteries that lead to the heart (coronary arteries) become narrow or blocked. The narrowing or blockage can lead to a heart attack.  Many people do not have any symptoms during the early stages of CAD.  CAD can be treated with lifestyle changes, medicines, surgery, or a combination of these treatments. This information is not intended to replace advice given to you by your health care provider. Make sure you discuss any questions you have with your health care provider. Document Revised: 02/06/2018 Document Reviewed:  01/27/2018 [employer] Patient Education  2020 [employer]  

## 2020-03-05 NOTE — Discharge Summary (Signed)
[facility] NAME: Bryce [name]    MR#:  [id number]  DATE OF BIRTH:  Jul 24, 1965  DATE OF ADMISSION:  02/29/2020   ADMITTING PHYSICIAN: Max Sane, MD  DATE OF DISCHARGE: 03/02/2020  2:50 PM  PRIMARY CARE PHYSICIAN: Iloabachie, Chioma E, NP   ADMISSION DIAGNOSIS:  Unstable angina ([place]) [I20.0] Chest pain [R07.9] Chest pain, unspecified type [R07.9] DISCHARGE DIAGNOSIS:  Active Problems:   Chest pain   Unstable angina (HCC)   Tobacco abuse   HFrEF (heart failure with reduced ejection fraction) ([place])  SECONDARY DIAGNOSIS:   Past Medical History:  Diagnosis Date  . ASD (atrial septal defect)    a. 04/2019 Echo: small ASD w/ predominantly L->R shunting.  . Coronary artery disease    a. 11/2010 NSTEMI/PCI: BMS -->LAD; b. 02/2017 Lateral STEMI/PCI: LM nl, LAD patent stent, LCX 100 (3.0x23 Xience Alpine DES), RCA 49m EF 50%; c. 11/2018 MV: Fixed apical and periapical defect (? over-processing). No significant ischemia. EF 48%.  .Marland KitchenGERD (gastroesophageal reflux disease)   . HFmrEF (heart failure with mid-range ejection fraction) ([place]    a. 04/2019 Echo: EF 45-50%, mild LVH. Mildly dil LA. Small ASA w/ predominantly L->R shunting.  .Marland KitchenHyperlipidemia LDL goal <70   . Hypertension   . Ischemic cardiomyopathy    a. 04/2019 Echo: EF 45-50%.  .Marland KitchenLBBB (left bundle branch block)   . MI (myocardial infarction) ([place]   . Migraine    HOSPITAL COURSE:  54y male with hx of CAD s/p prior LAD and left circumflex stenting, HFrEF secondary to ICM, hypertension, hyperlipidemia, tobacco use admitted for unstable angina [facility] CP with new left bundle branch block  1. CAD with unstable angina: - s/p catheterization on 9/30 -> stents are patent, no obstructive lesions noted Relatively unchanged from catheterization 2018 - continue Imdur, metoprolol, aspirin, Plavix, lisinopril & statin  2. HFrEF(EF 45 to 50%)secondary to  ICM:   3. Essential hypertension Continue Lopressor, Zestril  4. Hyperlipidemia Continue Zetia  5, Peripheral neuropathy Continue Neurontin  6. Morbid Obesity Body mass index is 39.23 kg/m.  7. Tobaccoand marijuanause, current:Continues to smoke approximately 1 pack of cigarettes per2 days.Also reports intermittent use of THC.Counseled.   DISCHARGE CONDITIONS:  stable CONSULTS OBTAINED:  Treatment Team:  AKate Sable MD DRUG ALLERGIES:   Allergies  Allergen Reactions  . Atorvastatin     Myalgias   DISCHARGE MEDICATIONS:   Allergies as of 03/02/2020      Reactions   Atorvastatin    Myalgias      Medication List    TAKE these medications   aspirin 81 MG EC tablet Take 1 tablet (81 mg total) by mouth daily.   Blood Pressure Kit 1 kit by Does not apply route daily.   clopidogrel 75 MG tablet Commonly known as: PLAVIX TAKE ONE TABLET BY MOUTH EVERY DAY   Docosanol 10 % Crea Commonly known as: Abreva Apply 1 application topically 2 (two) times daily. What changed:   when to take this  reasons to take this   DULoxetine 30 MG capsule Commonly known as: CYMBALTA Take 1 capsule (30 mg total) by mouth daily. What changed: when to take this   Excedrin Migraine 250-250-65 MG tablet Generic drug: aspirin-acetaminophen-caffeine Take 1 tablet by mouth daily as needed for headache. What changed:   how much to take  Another medication with the same name was removed. Continue taking this medication, and  follow the directions you see here.   ezetimibe 10 MG tablet Commonly known as: ZETIA TAKE ONE TABLET BY MOUTH EVERY DAY   gabapentin 100 MG capsule Commonly known as: NEURONTIN TAKE ONE CAPSULE BY MOUTH 3 TIMES A DAY   isosorbide mononitrate 30 MG 24 hr tablet Commonly known as: IMDUR Take 1 tablet (30 mg total) by mouth daily.   lisinopril 20 MG tablet Commonly known as: ZESTRIL Take 1 tablet (20 mg total) by mouth daily.     metoprolol tartrate 25 MG tablet Commonly known as: LOPRESSOR TAKE ONE TABLET BY MOUTH 2 TIMES A DAY   nitroGLYCERIN 0.4 MG SL tablet Commonly known as: NITROSTAT Place 1 tablet (0.4 mg total) under the tongue every 5 (five) minutes x 3 doses as needed for chest pain.   pantoprazole 40 MG tablet Commonly known as: PROTONIX TAKE ONE TABLET BY MOUTH EVERY DAY   potassium chloride 10 MEQ tablet Commonly known as: KLOR-CON Take 2 tablets (20 mEq total) by mouth 2 (two) times daily.   rosuvastatin 40 MG tablet Commonly known as: CRESTOR Take 1 tablet (40 mg total) by mouth daily.   terbinafine 1 % cream Commonly known as: Athletes Foot AF Apply 1 application topically 2 (two) times daily. What changed:   when to take this  reasons to take this   torsemide 20 MG tablet Commonly known as: DEMADEX Take 1 tablet (20 mg total) by mouth 2 (two) times daily. What changed:   when to take this  additional instructions      DISCHARGE INSTRUCTIONS:   DIET:  Cardiac diet DISCHARGE CONDITION:  Stable ACTIVITY:  Activity as tolerated OXYGEN:  Home Oxygen: No.  Oxygen Delivery: room air DISCHARGE LOCATION:  home   If you experience worsening of your admission symptoms, develop shortness of breath, life threatening emergency, suicidal or homicidal thoughts you must seek medical attention immediately by calling 911 or calling your MD immediately  if symptoms less severe.  You Must read complete instructions/literature along with all the possible adverse reactions/side effects for all the Medicines you take and that have been prescribed to you. Take any new Medicines after you have completely understood and accpet all the possible adverse reactions/side effects.   Please note  You were cared for by a hospitalist during your hospital stay. If you have any questions about your discharge medications or the care you received while you were in the hospital after you are discharged,  you can call the unit and asked to speak with the hospitalist on call if the hospitalist that took care of you is not available. Once you are discharged, your primary care physician will handle any further medical issues. Please note that NO REFILLS for any discharge medications will be authorized once you are discharged, as it is imperative that you return to your primary care physician (or establish a relationship with a primary care physician if you do not have one) for your aftercare needs so that they can reassess your need for medications and monitor your lab values.    On the day of Discharge:  VITAL SIGNS:  Blood pressure 115/84, pulse 62, temperature 98.5 F (36.9 C), temperature source Oral, resp. rate 16, height '5\' 8"'  (1.727 m), weight 118.1 kg, SpO2 96 %. PHYSICAL EXAMINATION:  GENERAL:  54 y.o.-year-old patient lying in the bed with no acute distress.  EYES: Pupils equal, round, reactive to light and accommodation. No scleral icterus. Extraocular muscles intact.  HEENT: Head atraumatic, normocephalic. Oropharynx  and nasopharynx clear.  NECK:  Supple, no jugular venous distention. No thyroid enlargement, no tenderness.  LUNGS: Normal breath sounds bilaterally, no wheezing, rales,rhonchi or crepitation. No use of accessory muscles of respiration.  CARDIOVASCULAR: S1, S2 normal. No murmurs, rubs, or gallops.  ABDOMEN: Soft, non-tender, non-distended. Bowel sounds present. No organomegaly or mass.  EXTREMITIES: No pedal edema, cyanosis, or clubbing.  NEUROLOGIC: Cranial nerves II through XII are intact. Muscle strength 5/5 in all extremities. Sensation intact. Gait not checked.  PSYCHIATRIC: The patient is alert and oriented x 3.  SKIN: No obvious rash, lesion, or ulcer.  DATA REVIEW:   CBC Recent Labs  Lab 03/02/20 0815  WBC 6.8  HGB 14.7  HCT 42.8  PLT 192    Chemistries  Recent Labs  Lab 02/29/20 1553 02/29/20 1553 03/02/20 0815  NA 139   < > 139  K 3.9   < > 4.1    CL 106   < > 103  CO2 24   < > 29  GLUCOSE 147*   < > 99  BUN 13   < > 20  CREATININE 0.94   < > 1.11  CALCIUM 9.1   < > 8.7*  AST 23  --   --   ALT 26  --   --   ALKPHOS 58  --   --   BILITOT 0.8  --   --    < > = values in this interval not displayed.     Outpatient follow-up  Follow-up Information    Iloabachie, Chioma E, NP. Schedule an appointment as soon as possible for a visit on 03/15/2020.   Specialty: Gerontology Why: @ 11am Contact information: [place] [address] [phone]        Minna Merritts, MD. Schedule an appointment as soon as possible for a visit on 03/14/2020.   Specialty: Cardiology Why: @ 11am Contact information: [place] [address] [phone]                Management plans discussed with the patient, family and they are in agreement.  CODE STATUS: Prior   TOTAL TIME TAKING CARE OF THIS PATIENT: 45 minutes.    Max Sane M.D on 03/05/2020 at 7:18 PM  [facility]   CC: Primary care physician; Langston Reusing, NP   Note: This dictation was prepared with Dragon dictation along with smaller phrase technology. Any transcriptional errors that result from this process are unintentional.

## 2020-03-06 ENCOUNTER — Other Ambulatory Visit: Payer: Disability Insurance

## 2020-03-07 ENCOUNTER — Ambulatory Visit: Payer: Medicaid Other | Admitting: Licensed Clinical Social Worker

## 2020-03-08 ENCOUNTER — Other Ambulatory Visit: Payer: Self-pay

## 2020-03-08 ENCOUNTER — Other Ambulatory Visit: Payer: Medicaid Other

## 2020-03-08 ENCOUNTER — Ambulatory Visit: Payer: Medicaid Other | Admitting: Gerontology

## 2020-03-08 DIAGNOSIS — Z0000 Encounter for general adult medical examination without abnormal findings: Secondary | ICD-10-CM

## 2020-03-08 DIAGNOSIS — G8929 Other chronic pain: Secondary | ICD-10-CM

## 2020-03-08 DIAGNOSIS — I10 Essential (primary) hypertension: Secondary | ICD-10-CM

## 2020-03-09 ENCOUNTER — Other Ambulatory Visit: Payer: Self-pay

## 2020-03-09 LAB — HEPATIC FUNCTION PANEL
ALT: 35 IU/L (ref 0–44)
AST: 20 IU/L (ref 0–40)
Albumin: 4.6 g/dL (ref 3.8–4.9)
Alkaline Phosphatase: 68 IU/L (ref 44–121)
Bilirubin Total: 0.4 mg/dL (ref 0.0–1.2)
Bilirubin, Direct: 0.15 mg/dL (ref 0.00–0.40)
Total Protein: 6.8 g/dL (ref 6.0–8.5)

## 2020-03-09 LAB — SEDIMENTATION RATE: Sed Rate: 6 mm/hr (ref 0–30)

## 2020-03-09 LAB — BASIC METABOLIC PANEL
BUN/Creatinine Ratio: 10 (ref 9–20)
BUN: 11 mg/dL (ref 6–24)
CO2: 24 mmol/L (ref 20–29)
Calcium: 9.6 mg/dL (ref 8.7–10.2)
Chloride: 104 mmol/L (ref 96–106)
Creatinine, Ser: 1.06 mg/dL (ref 0.76–1.27)
GFR calc Af Amer: 91 mL/min/{1.73_m2} (ref >59)
GFR calc non Af Amer: 79 mL/min/{1.73_m2} (ref >59)
Glucose: 91 mg/dL (ref 65–99)
Potassium: 4.3 mmol/L (ref 3.5–5.2)
Sodium: 141 mmol/L (ref 134–144)

## 2020-03-09 LAB — C-REACTIVE PROTEIN: CRP: 1 mg/L (ref 0–10)

## 2020-03-09 LAB — RHEUMATOID FACTOR: Rheumatoid fact SerPl-aCnc: <10 IU/mL (ref 0.0–13.9)

## 2020-03-09 LAB — LIPID PANEL
Chol/HDL Ratio: 2.8 ratio (ref 0.0–5.0)
Cholesterol, Total: 112 mg/dL (ref 100–199)
HDL: 40 mg/dL (ref >39)
LDL Chol Calc (NIH): 51 mg/dL (ref 0–99)
Triglycerides: 113 mg/dL (ref 0–149)
VLDL Cholesterol Cal: 21 mg/dL (ref 5–40)

## 2020-03-09 LAB — HEMOGLOBIN A1C
Est. average glucose Bld gHb Est-mCnc: 126 mg/dL
Hgb A1c MFr Bld: 6 % — ABNORMAL HIGH (ref 4.8–5.6)

## 2020-03-09 MED ORDER — DULOXETINE HCL 30 MG PO CPEP: 30.0000 mg | ORAL_CAPSULE | Freq: Two times a day (BID) | ORAL | 0 refills | Status: DC

## 2020-03-10 ENCOUNTER — Telehealth: Payer: Self-pay | Admitting: Pharmacist

## 2020-03-10 NOTE — Telephone Encounter (Signed)
03/10/2020 8:37:39 AM - Call to [facility] for Cymbalta  -- Rhetta Mura - Friday, March 10, 2020 8:35 AM --Received notice from [facility] for Cymbalta to "Confirm the spelling of patient's name". I called [facility] [phone] spoke with Fay Records, she contacted the pharmacy and has the spelling confirmed and shipment is scheduled to be sent 03/16/2020 to our office.

## 2020-03-11 LAB — URINALYSIS

## 2020-03-13 NOTE — Progress Notes (Signed)
`       Evaluation Performed:  Follow-up visit  Date:  03/14/2020   ID:  Bryce [name], Bryce [name] 05-30-1966, MRN [id number]  Patient Location:  [place] [address]   Provider location:   [facility], [place] office  PCP:  Langston Reusing, NP  Cardiologist:  Patsy [place]  Chief Complaint  Patient presents with  . office visit    Hospital F/U-s/p cardiac cath. Patietn reports persistent headache since starting isosorbide; Meds verbally reviewed with patient.    History of Present Illness:    Bryce [name] is a 54 y.o. male  past medical history of 1. Status post non-STEMI, BMS LAD 11/13/2010 2. Status post lateral wall STEMI 02/02/2017 3. Status post DES mid to distal left circumflex 02/02/2017 4. Essential hypertension 5. Hyperlipidemia 6. Tobacco abuse Presenting for f/u of his CAD, prior PCI, recent cardiac catheterization showing nonobstructive disease, patent stents  Frequent trips to ER for leg swelling and SOB in 03/2019 Stress test 11/2018: low risk  Echo 04/2019   1. Left ventricular ejection fraction, by visual estimation, is 45 to 50%.  Recent unstable angina symptoms, left bundle branch block, Was seen in clinic, scheduled for catheterization March 02, 2020 That showed nonobstructive coronary disease Patent stent in the left circumflex and LAD, no intervention needed Started on Imdur 30  Active, trying to walk more Quit smoking On Imdur 30 reports having a mild headache  Denies any leg swelling, no significant shortness of breath Tolerating torsemide 40 daily  EKG personally reviewed by myself on todays visit NSR rate 63 bpm T wave abnormality V1 through V6, 2 3 aVF Prior left bundle branch block has resolved  Prior records reviewed [facility] 02/02/2017 with lateral wall ST elevation myocardial infarction.  Coronary angiography revealed 100% stenosis of distal left circumflex, and normal left ventricular  function.  -- PCI to the mid to distal left circumflex with a 3.0 x 23 mm Xience Alpine DES.   2D echocardiogram on 02/03/17 revealed borderline mildly reduced left ventricular function with LVEF 45-50%.   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    Prior CV studies:   The following studies were reviewed today:    Past Medical History:  Diagnosis Date  . ASD (atrial septal defect)    a. 04/2019 Echo: small ASD w/ predominantly L->R shunting.  . Coronary artery disease    a. 11/2010 NSTEMI/PCI: BMS -->LAD; b. 02/2017 Lateral STEMI/PCI: LM nl, LAD patent stent, LCX 100 (3.0x23 Xience Alpine DES), RCA 77m EF 50%; c. 11/2018 MV: Fixed apical and periapical defect (? over-processing). No significant ischemia. EF 48%.  .Marland KitchenGERD (gastroesophageal reflux disease)   . HFmrEF (heart failure with mid-range ejection fraction) ([place]    a. 04/2019 Echo: EF 45-50%, mild LVH. Mildly dil LA. Small ASA w/ predominantly L->R shunting.  .Marland KitchenHyperlipidemia LDL goal <70   . Hypertension   . Ischemic cardiomyopathy    a. 04/2019 Echo: EF 45-50%.  .Marland KitchenLBBB (left bundle branch block)   . MI (myocardial infarction) ([facility]   . Migraine    Past Surgical History:  Procedure Laterality Date  . CARDIAC CATHETERIZATION N/A 12/28/2014   Procedure: Left Heart Cath and Coronary Angiography;  Surgeon: DYolonda Kida MD;  [facility];  Service: Cardiovascular;  Laterality: N/A;  . CORONARY ANGIOPLASTY     Non-ST elevation MI status  post stenting within bare-metal stent of the 75% lesion of the LAD, 11/13/2010.   . CORONARY STENT INTERVENTION N/A 02/02/2017   Procedure: CORONARY/GRAFT ACUTE MI REVASCULARIZATION;  Surgeon: Yolonda Kida, MD;  Location: [facility];  Service: Cardiovascular;  Laterality: N/A;  . LEFT HEART CATH AND CORONARY ANGIOGRAPHY N/A 02/02/2017   Procedure: LEFT HEART CATH AND  CORONARY ANGIOGRAPHY;  Surgeon: Yolonda Kida, MD;  Location: [place];  Service: Cardiovascular;  Laterality: N/A;  . LEFT HEART CATH AND CORONARY ANGIOGRAPHY N/A 03/02/2020   Procedure: LEFT HEART CATH AND CORONARY ANGIOGRAPHY;  Surgeon: Minna Merritts, MD;  Location: [place];  Service: Cardiovascular;  Laterality: N/A;     Current Meds  Medication Sig  . aspirin EC 81 MG EC tablet Take 1 tablet (81 mg total) by mouth daily.  Marland Kitchen aspirin-acetaminophen-caffeine (EXCEDRIN MIGRAINE) 250-250-65 MG tablet Take by mouth as needed for headache.  . Blood Pressure KIT 1 kit by Does not apply route daily.  . clopidogrel (PLAVIX) 75 MG tablet TAKE ONE TABLET BY MOUTH EVERY DAY  . Docosanol (ABREVA) 10 % CREA Apply topically as needed.  . DULoxetine (CYMBALTA) 30 MG capsule Take 1 capsule (30 mg total) by mouth 2 (two) times daily.  Marland Kitchen ezetimibe (ZETIA) 10 MG tablet TAKE ONE TABLET BY MOUTH EVERY DAY  . gabapentin (NEURONTIN) 100 MG capsule TAKE ONE CAPSULE BY MOUTH 3 TIMES A DAY  . isosorbide mononitrate (IMDUR) 30 MG 24 hr tablet Take 1 tablet (30 mg total) by mouth daily.  Marland Kitchen lisinopril (ZESTRIL) 20 MG tablet Take 1 tablet (20 mg total) by mouth daily.  . metoprolol tartrate (LOPRESSOR) 25 MG tablet TAKE ONE TABLET BY MOUTH 2 TIMES A DAY  . nitroGLYCERIN (NITROSTAT) 0.4 MG SL tablet Place 1 tablet (0.4 mg total) under the tongue every 5 (five) minutes x 3 doses as needed for chest pain.  . pantoprazole (PROTONIX) 40 MG tablet TAKE ONE TABLET BY MOUTH EVERY DAY  . potassium chloride (KLOR-CON) 10 MEQ tablet Take 2 tablets (20 mEq total) by mouth 2 (two) times daily.  . rosuvastatin (CRESTOR) 40 MG tablet Take 1 tablet (40 mg total) by mouth daily.  Marland Kitchen terbinafine (LAMISIL) 1 % cream Apply 1 application topically 2 (two) times daily as needed.  . torsemide (DEMADEX) 20 MG tablet Take 20 mg by mouth 2 (two) times daily as needed.     Allergies:   Atorvastatin   Social  History   Tobacco Use  . Smoking status: Current Some Day Smoker    Years: 5.00    Types: Cigarettes    Last attempt to quit: 01/05/2017    Years since quitting: 3.1  . Smokeless tobacco: Never Used  . Tobacco comment: Typically 1 pack a week but recently sometimes 2 packs 10/18/19  Vaping Use  . Vaping Use: Never used  Substance Use Topics  . Alcohol use: Not Currently    Alcohol/week: 1.0 standard drink    Types: 1 Cans of beer per week    Comment: Social Drinker  . Drug use: Yes    Types: Marijuana    Comment: everyday      Family Hx: The patient's family history includes CAD in his father. There is no history of Kidney disease or Diabetes Mellitus II.  ROS:   Please see the history of present illness.    Review of Systems  Constitutional: Negative.   HENT: Negative.   Respiratory: Positive for shortness  of breath.   Cardiovascular: Positive for leg swelling.  Gastrointestinal: Negative.   Musculoskeletal: Negative.   Neurological: Negative.   Psychiatric/Behavioral: Negative.   All other systems reviewed and are negative.    Labs/Other Tests and Data Reviewed:    Recent Labs: 12/16/2019: Magnesium 2.2 02/29/2020: B Natriuretic Peptide 20.2 03/02/2020: Hemoglobin 14.7; Platelets 192 03/08/2020: ALT 35; BUN 11; Creatinine, Ser 1.06; Potassium 4.3; Sodium 141   Recent Lipid Panel Lab Results  Component Value Date/Time   CHOL 112 03/08/2020 11:03 AM   TRIG 113 03/08/2020 11:03 AM   HDL 40 03/08/2020 11:03 AM   CHOLHDL 2.8 03/08/2020 11:03 AM   CHOLHDL 2.5 03/01/2020 06:15 AM   LDLCALC 51 03/08/2020 11:03 AM   LDLDIRECT 107 (H) 04/06/2019 03:12 PM    Wt Readings from Last 3 Encounters:  03/14/20 258 lb 2 oz (117.1 kg)  03/02/20 260 lb 5.8 oz (118.1 kg)  02/29/20 258 lb (117 kg)     Exam:    Vital Signs: Vital signs may also be detailed in the HPI BP 110/74 (BP Location: Left Arm, Patient Position: Sitting, Cuff Size: Large)   Pulse 62   Ht '5\' 8"'  (1.727  m)   Wt 258 lb 2 oz (117.1 kg)   SpO2 97%   BMI 39.25 kg/m   Constitutional:  oriented to person, place, and time. No distress.  HENT:  Head: Grossly normal Eyes:  no discharge. No scleral icterus.  Neck: No JVD, no carotid bruits  Cardiovascular: Regular rate and rhythm, no murmurs appreciated Pulmonary/Chest: Clear to auscultation bilaterally, no wheezes or rails Abdominal: Soft.  no distension.  no tenderness.  Musculoskeletal: Normal range of motion Neurological:  normal muscle tone. Coordination normal. 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 (HCC) Long history of coronary disease with stenting going back to 2012 Recent cardiac catheterization, patent stents, nonobstructive disease Has intermittent left bundle branch block, asymptomatic in terms of his chest pain on isosorbide 30 but is having headache.  Recommend he try half pill daily Other option would be Ranexa  Essential hypertension Blood pressure is well controlled on today's visit. No changes made to the medications.  Mixed hyperlipidemia Continue Zetia with Crestor 40 daily Numbers at goal.  Recommend he stay on both  Smoker Reports he has quit smoking, feels well  Chronic diastolic/systolic CHF No significant edema, tolerating torsemide Breathing stable   Total encounter time more than 25 minutes  Greater than 50% was spent in counseling and coordination of care with the patient    Signed, Ida Rogue, MD  03/14/2020 11:42 AM    [place] Office [address], [place], [place] [address]

## 2020-03-14 ENCOUNTER — Encounter: Payer: Self-pay | Admitting: Cardiovascular Disease

## 2020-03-14 ENCOUNTER — Other Ambulatory Visit: Payer: Self-pay

## 2020-03-14 ENCOUNTER — Ambulatory Visit (INDEPENDENT_AMBULATORY_CARE_PROVIDER_SITE_OTHER): Payer: Medicaid Other | Admitting: Cardiovascular Disease

## 2020-03-14 VITALS — BP 110/74 | HR 62 | Ht 68.0 in | Wt 258.1 lb

## 2020-03-14 DIAGNOSIS — I5022 Chronic systolic (congestive) heart failure: Secondary | ICD-10-CM

## 2020-03-14 DIAGNOSIS — E785 Hyperlipidemia, unspecified: Secondary | ICD-10-CM

## 2020-03-14 DIAGNOSIS — I25110 Atherosclerotic heart disease of native coronary artery with unstable angina pectoris: Secondary | ICD-10-CM

## 2020-03-14 DIAGNOSIS — I213 ST elevation (STEMI) myocardial infarction of unspecified site: Secondary | ICD-10-CM

## 2020-03-14 DIAGNOSIS — I255 Ischemic cardiomyopathy: Secondary | ICD-10-CM | POA: Diagnosis not present

## 2020-03-14 DIAGNOSIS — I10 Essential (primary) hypertension: Secondary | ICD-10-CM

## 2020-03-14 DIAGNOSIS — Z720 Tobacco use: Secondary | ICD-10-CM

## 2020-03-14 DIAGNOSIS — I5020 Unspecified systolic (congestive) heart failure: Secondary | ICD-10-CM

## 2020-03-14 MED ORDER — METOPROLOL TARTRATE 25 MG PO TABS: ORAL_TABLET | ORAL | 3 refills | Status: DC

## 2020-03-14 MED ORDER — ROSUVASTATIN CALCIUM 40 MG PO TABS: 40.0000 mg | ORAL_TABLET | Freq: Every day | ORAL | 3 refills | Status: DC

## 2020-03-14 MED ORDER — ISOSORBIDE MONONITRATE ER 30 MG PO TB24: 30.0000 mg | ORAL_TABLET | Freq: Every day | ORAL | 3 refills | Status: DC

## 2020-03-14 MED ORDER — LISINOPRIL 20 MG PO TABS: 20.0000 mg | ORAL_TABLET | Freq: Every day | ORAL | 3 refills | Status: DC

## 2020-03-14 MED ORDER — TORSEMIDE 20 MG PO TABS: 20.0000 mg | ORAL_TABLET | ORAL | 3 refills | Status: DC

## 2020-03-14 MED ORDER — POTASSIUM CHLORIDE CRYS ER 10 MEQ PO TBCR: 20.0000 meq | EXTENDED_RELEASE_TABLET | Freq: Two times a day (BID) | ORAL | 3 refills | Status: DC

## 2020-03-14 MED ORDER — CLOPIDOGREL BISULFATE 75 MG PO TABS: 75.0000 mg | ORAL_TABLET | Freq: Every day | ORAL | 3 refills | Status: DC

## 2020-03-14 MED ORDER — EZETIMIBE 10 MG PO TABS
10.0000 mg | ORAL_TABLET | Freq: Every day | ORAL | 3 refills | Status: DC
Start: 2020-03-14 — End: 2020-05-24

## 2020-03-14 NOTE — Patient Instructions (Signed)
Medication Instructions:  No changes  If you need a refill on your cardiac medications before your next appointment, please call your pharmacy.    Lab work: No new labs needed   If you have labs (blood work) drawn today and your tests are completely normal, you will receive your results only by: . MyChart Message (if you have MyChart) OR . A paper copy in the mail If you have any lab test that is abnormal or we need to change your treatment, we will call you to review the results.   Testing/Procedures: No new testing needed   Follow-Up: At [facility], you and your health needs are our priority.  As part of our continuing mission to provide you with exceptional heart care, we have created designated Provider Care Teams.  These Care Teams include your primary Cardiologist (physician) and Advanced Practice Providers (APPs -  Physician Assistants and Nurse Practitioners) who all work together to provide you with the care you need, when you need it.  . You will need a follow up appointment in 12 months  . Providers on your designated Care Team:   . Christopher Berge, NP . Ryan Dunn, PA-C . Jacquelyn Visser, PA-C  Any Other Special Instructions Will Be Listed Below (If Applicable).  COVID-19 Vaccine Information can be found at: [email or web address] For questions related to vaccine distribution or appointments, please email [email or web address] or call [phone].     

## 2020-03-15 ENCOUNTER — Ambulatory Visit: Payer: Medicaid Other | Admitting: Gerontology

## 2020-03-21 ENCOUNTER — Ambulatory Visit: Payer: Self-pay | Admitting: Licensed Clinical Social Worker

## 2020-03-21 ENCOUNTER — Ambulatory Visit: Payer: Medicaid Other | Admitting: Licensed Clinical Social Worker

## 2020-03-21 ENCOUNTER — Other Ambulatory Visit: Payer: Self-pay

## 2020-03-21 DIAGNOSIS — F341 Dysthymic disorder: Secondary | ICD-10-CM

## 2020-03-21 NOTE — BH Specialist Note (Signed)
Integrated Behavioral Health Follow Up Visit  MRN: [id number] Name: Bryce [name]    Type of Service: Integrated Behavioral Health- Individual/Family Interpretor:No. Interpretor Name and Language: none  SUBJECTIVE: Bryce [name] is a 54 y.o. male accompanied by herself  Patient was referred by Hurman Horn, Np  for mental health Patient reports the following symptoms/concerns: The patient stated that he had a heart attack two weeks ago and that he has been feeling weak . He states that he is looking for a new place to live. He discussed relationship stressors and finical stressor that are impacting his life. He states that he misses his previous clinician and wants to tell her hello. He also wants to check if he can increase his dose of Cymbalta as he feels it is not effective anymore. The patient reports that he has lost a couple of pounds but the doctor wants him to lose more.The patient denies any suicidal or homicidal thoughts.  Duration of problem: ; Severity of problem: moderate  OBJECTIVE: Mood: Euthymic and Affect: Appropriate Risk of harm to self or others: No plan to harm self or others  LIFE CONTEXT: Family and Social: see above School/Work: see above Self-Care: see above Life Changes: see above  GOALS ADDRESSED: Patient will: 1.  Reduce symptoms of: anxiety, depression and mood instability  2.  Increase knowledge and/or ability of: coping skills, healthy habits and self-management skills  3.  Demonstrate ability to: Increase healthy adjustment to current life circumstances  INTERVENTIONS: Interventions utilized:  Supportive Counseling was utilized by the clinician during today's follow-up session. The clinician processed with the patient how he has been doing since his last follow up session with Carey Bullocks, LCSW. Clinician measured the patient's symptoms of anxiety and depression on a numerical scale. Clinician utilized reflective listening encouraging the  patient to ventilate his feelings towards his current situation. Clinician measured the patient's anxiety and depression on a numerical scale. Clinician explained to the patient that he cannot control other people, only his own thoughts, actions, and beliefs. Clinician encouraged the patient to focus on what is in his control versus what is out of his control.  Standardized Assessments completed: GAD-7 and PHQ 9  GAD-7  15 PHQ-9 14  ASSESSMENT: Patient currently experiencing see above   Patient may benefit from see above  PLAN: 1. Follow up with behavioral health clinician on : 04/21/2020 at 6:30 pm  2. Behavioral recommendations: 3. Referral(s): Integrated [employer] (In Clinic) 4. "From scale of 1-10, how likely are you to follow plan?":   Judith Part, Student-Social Work

## 2020-03-28 ENCOUNTER — Other Ambulatory Visit: Payer: Self-pay

## 2020-03-28 ENCOUNTER — Ambulatory Visit: Payer: Medicaid Other | Admitting: Gerontology

## 2020-03-28 VITALS — BP 117/82 | HR 75 | Temp 96.4°F | Resp 16 | Wt 256.3 lb

## 2020-03-28 DIAGNOSIS — R7303 Prediabetes: Secondary | ICD-10-CM

## 2020-03-28 DIAGNOSIS — Z0000 Encounter for general adult medical examination without abnormal findings: Secondary | ICD-10-CM

## 2020-03-28 MED ORDER — METFORMIN HCL 1000 MG PO TABS: 500.0000 mg | ORAL_TABLET | Freq: Every day | ORAL | 0 refills | Status: AC

## 2020-03-28 MED ORDER — METFORMIN HCL 500 MG PO TABS: 500.0000 mg | ORAL_TABLET | Freq: Every day | ORAL | 0 refills | Status: DC

## 2020-03-28 NOTE — Patient Instructions (Signed)
Carbohydrate Counting for Diabetes Mellitus, Adult  Carbohydrate counting is a method of keeping track of how many carbohydrates you eat. Eating carbohydrates naturally increases the amount of sugar (glucose) in the blood. Counting how many carbohydrates you eat helps keep your blood glucose within normal limits, which helps you manage your diabetes (diabetes mellitus). It is important to know how many carbohydrates you can safely have in each meal. This is different for every person. A diet and nutrition specialist (registered dietitian) can help you make a meal plan and calculate how many carbohydrates you should have at each meal and snack. Carbohydrates are found in the following foods:  Grains, such as breads and cereals.  Dried beans and soy products.  Starchy vegetables, such as potatoes, peas, and corn.  Fruit and fruit juices.  Milk and yogurt.  Sweets and snack foods, such as cake, cookies, candy, chips, and soft drinks. How do I count carbohydrates? There are two ways to count carbohydrates in food. You can use either of the methods or a combination of both. Reading "Nutrition Facts" on packaged food The "Nutrition Facts" list is included on the labels of almost all packaged foods and beverages in the [place] It includes:  The serving size.  Information about nutrients in each serving, including the grams (g) of carbohydrate per serving. To use the "Nutrition Facts":  Decide how many servings you will have.  Multiply the number of servings by the number of carbohydrates per serving.  The resulting number is the total amount of carbohydrates that you will be having. Learning standard serving sizes of other foods When you eat carbohydrate foods that are not packaged or do not include "Nutrition Facts" on the label, you need to measure the servings in order to count the amount of carbohydrates:  Measure the foods that you will eat with a food scale or measuring cup, if  needed.  Decide how many standard-size servings you will eat.  Multiply the number of servings by 15. Most carbohydrate-rich foods have about 15 g of carbohydrates per serving. ? For example, if you eat 8 oz (170 g) of strawberries, you will have eaten 2 servings and 30 g of carbohydrates (2 servings x 15 g = 30 g).  For foods that have more than one food mixed, such as soups and casseroles, you must count the carbohydrates in each food that is included. The following list contains standard serving sizes of common carbohydrate-rich foods. Each of these servings has about 15 g of carbohydrates:   hamburger bun or  English muffin.   oz (15 mL) syrup.   oz (14 g) jelly.  1 slice of bread.  1 six-inch tortilla.  3 oz (85 g) cooked rice or pasta.  4 oz (113 g) cooked dried beans.  4 oz (113 g) starchy vegetable, such as peas, corn, or potatoes.  4 oz (113 g) hot cereal.  4 oz (113 g) mashed potatoes or  of a large baked potato.  4 oz (113 g) canned or frozen fruit.  4 oz (120 mL) fruit juice.  4-6 crackers.  6 chicken nuggets.  6 oz (170 g) unsweetened dry cereal.  6 oz (170 g) plain fat-free yogurt or yogurt sweetened with artificial sweeteners.  8 oz (240 mL) milk.  8 oz (170 g) fresh fruit or one small piece of fruit.  24 oz (680 g) popped popcorn. Example of carbohydrate counting Sample meal  3 oz (85 g) chicken breast.  6 oz (170 g)   brown rice.  4 oz (113 g) corn.  8 oz (240 mL) milk.  8 oz (170 g) strawberries with sugar-free whipped topping. Carbohydrate calculation 1. Identify the foods that contain carbohydrates: ? Rice. ? Corn. ? Milk. ? Strawberries. 2. Calculate how many servings you have of each food: ? 2 servings rice. ? 1 serving corn. ? 1 serving milk. ? 1 serving strawberries. 3. Multiply each number of servings by 15 g: ? 2 servings rice x 15 g = 30 g. ? 1 serving corn x 15 g = 15 g. ? 1 serving milk x 15 g = 15 g. ? 1  serving strawberries x 15 g = 15 g. 4. Add together all of the amounts to find the total grams of carbohydrates eaten: ? 30 g + 15 g + 15 g + 15 g = 75 g of carbohydrates total. Summary  Carbohydrate counting is a method of keeping track of how many carbohydrates you eat.  Eating carbohydrates naturally increases the amount of sugar (glucose) in the blood.  Counting how many carbohydrates you eat helps keep your blood glucose within normal limits, which helps you manage your diabetes.  A diet and nutrition specialist (registered dietitian) can help you make a meal plan and calculate how many carbohydrates you should have at each meal and snack. This information is not intended to replace advice given to you by your health care provider. Make sure you discuss any questions you have with your health care provider. Document Revised: 12/12/2016 Document Reviewed: 11/01/2015 Elsevier Patient Education  2020 Elsevier Inc.  

## 2020-03-28 NOTE — Progress Notes (Signed)
Established Patient Office Visit  Subjective:  Patient ID: Bryce [name], male    DOB: 05-05-1966  Age: 54 y.o. MRN: [id number]  CC: No chief complaint on file.   HPI Bryce [name] presents for follow up lab review. His lab done on 03/08/2020, HgbA1c was 6%, he states that he's compliant with his medications and continues to make healthy lifestyle choices. He states that he quit smoking and continues to weigh himself every morning. He states that his Medicaid has been approved. Overall, he states that he's doing well and offers no further complaint.  Past Medical History:  Diagnosis Date  . ASD (atrial septal defect)    a. 04/2019 Echo: small ASD w/ predominantly L->R shunting.  . Coronary artery disease    a. 11/2010 NSTEMI/PCI: BMS -->LAD; b. 02/2017 Lateral STEMI/PCI: LM nl, LAD patent stent, LCX 100 (3.0x23 Xience Alpine DES), RCA 39m EF 50%; c. 11/2018 MV: Fixed apical and periapical defect (? over-processing). No significant ischemia. EF 48%.  .Marland KitchenGERD (gastroesophageal reflux disease)   . 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 04/2019 Echo: EF 45-50%, mild LVH. Mildly dil LA. Small ASA w/ predominantly L->R shunting.  .Marland KitchenHyperlipidemia LDL goal <70   . Hypertension   . Ischemic cardiomyopathy    a. 04/2019 Echo: EF 45-50%.  .Marland KitchenLBBB (left bundle branch block)   . MI ([place]ocardial infarction) ([place]   . Migraine     Past Surgical History:  Procedure Laterality Date  . CARDIAC CATHETERIZATION N/A 12/28/2014   Procedure: Left Heart Cath and Coronary Angiography;  Surgeon: DYolonda Kida MD;  [facility];  Service: Cardiovascular;  Laterality: N/A;  . CORONARY ANGIOPLASTY     Non-ST elevation MI status post stenting within bare-metal stent of the 75% lesion of the LAD, 11/13/2010.   . CORONARY STENT INTERVENTION N/A 02/02/2017   Procedure: CORONARY/GRAFT ACUTE MI REVASCULARIZATION;  Surgeon: CYolonda Kida MD;  [facility];  Service: Cardiovascular;  Laterality: N/A;  . LEFT HEART CATH AND CORONARY ANGIOGRAPHY N/A 02/02/2017   Procedure: LEFT HEART CATH AND CORONARY ANGIOGRAPHY;  Surgeon: CYolonda Kida MD;  [facility];  Service: Cardiovascular;  Laterality: N/A;  . LEFT HEART CATH AND CORONARY ANGIOGRAPHY N/A 03/02/2020   Procedure: LEFT HEART CATH AND CORONARY ANGIOGRAPHY;  Surgeon: GMinna Merritts MD;  [facility];  Service: Cardiovascular;  Laterality: N/A;    Family History  Problem Relation Age of Onset  . CAD Father   . Kidney disease Neg Hx   . Diabetes Mellitus II Neg Hx     Social History   Socioeconomic History  . Marital status: Single    Spouse name: Not on file  . Number of children: 2  . Years of education: Not on file  . Highest education level: Not on file  Occupational History  . Occupation: BProofreader   Comment: Currently on disability/unemployed  Tobacco Use  . Smoking status: Current Some Day Smoker    Years: 5.00    Types: Cigarettes    Last attempt to quit: 01/05/2017    Years since quitting: 3.2  . Smokeless tobacco: Never Used  . Tobacco comment: Typically 1 pack a week but recently sometimes 2 packs 10/18/19  Vaping Use  . Vaping Use: Never used  Substance and Sexual Activity  . Alcohol use: Not Currently    Alcohol/week: 1.0 standard  drink    Types: 1 Cans of beer per week    Comment: Social Drinker  . Drug use: Yes    Types: Marijuana    Comment: everyday  . Sexual activity: Yes  Other Topics Concern  . Not on file  Social History Narrative   Social determinants screening completed. HS   Social Determinants of Health   Financial Resource Strain: High Risk  . Difficulty of Paying Living Expenses: Hard  Food Insecurity: No Food Insecurity  . Worried About Charity fundraiser in the Last Year: Never true  . Ran Out of Food in the Last Year: Never true  Transportation Needs: No Transportation  Needs  . Lack of Transportation (Medical): No  . Lack of Transportation (Non-Medical): No  Physical Activity: Sufficiently Active  . Days of Exercise per Week: 5 days  . Minutes of Exercise per Session: 40 min  Stress: Stress Concern Present  . Feeling of Stress : To some extent  Social Connections: Moderately Isolated  . Frequency of Communication with Friends and Family: Three times a week  . Frequency of Social Gatherings with Friends and Family: Three times a week  . Attends Religious Services: More than 4 times per year  . Active Member of Clubs or Organizations: No  . Attends Archivist Meetings: Never  . Marital Status: Separated  Intimate Partner Violence: Not At Risk  . Fear of Current or Ex-Partner: No  . Emotionally Abused: No  . Physically Abused: No  . Sexually Abused: No    Outpatient Medications Prior to Visit  Medication Sig Dispense Refill  . aspirin EC 81 MG EC tablet Take 1 tablet (81 mg total) by mouth daily. 30 tablet 0  . aspirin-acetaminophen-caffeine (EXCEDRIN MIGRAINE) 250-250-65 MG tablet Take 1 tablet by mouth daily as needed for headache. (Patient taking differently: Take 2 tablets by mouth daily as needed for headache. ) 30 tablet 0  . clopidogrel (PLAVIX) 75 MG tablet Take 1 tablet (75 mg total) by mouth daily. 90 tablet 3  . Docosanol (ABREVA) 10 % CREA Apply 1 application topically 2 (two) times daily. (Patient taking differently: Apply 1 application topically 2 (two) times daily as needed. ) 1 Tube 0  . DULoxetine (CYMBALTA) 30 MG capsule Take 1 capsule (30 mg total) by mouth 2 (two) times daily. 60 capsule 0  . ezetimibe (ZETIA) 10 MG tablet Take 1 tablet (10 mg total) by mouth daily. 90 tablet 3  . gabapentin (NEURONTIN) 100 MG capsule TAKE ONE CAPSULE BY MOUTH 3 TIMES A DAY 90 capsule 0  . isosorbide mononitrate (IMDUR) 30 MG 24 hr tablet Take 1 tablet (30 mg total) by mouth daily. 90 tablet 3  . lisinopril (ZESTRIL) 20 MG tablet Take 1  tablet (20 mg total) by mouth daily. 90 tablet 3  . metoprolol tartrate (LOPRESSOR) 25 MG tablet TAKE ONE TABLET BY MOUTH 2 TIMES A DAY 180 tablet 3  . nitroGLYCERIN (NITROSTAT) 0.4 MG SL tablet Place 1 tablet (0.4 mg total) under the tongue every 5 (five) minutes x 3 doses as needed for chest pain. 25 tablet 1  . pantoprazole (PROTONIX) 40 MG tablet TAKE ONE TABLET BY MOUTH EVERY DAY 90 tablet 0  . potassium chloride (KLOR-CON) 10 MEQ tablet Take 2 tablets (20 mEq total) by mouth 2 (two) times daily. Take with fluid pill 360 tablet 3  . rosuvastatin (CRESTOR) 40 MG tablet Take 1 tablet (40 mg total) by mouth daily. 90 tablet 3  .  terbinafine (LAMISIL) 1 % cream Apply 1 application topically 2 (two) times daily as needed.    . torsemide (DEMADEX) 20 MG tablet Take 20 mg by mouth 2 (two) times daily as needed.    . torsemide (DEMADEX) 20 MG tablet Take 1 tablet (20 mg total) by mouth as directed. 21m daily and additional 25mPM PRN 180 tablet 3  . aspirin-acetaminophen-caffeine (EXCEDRIN MIGRAINE) 250-250-65 MG tablet Take by mouth as needed for headache.    . Blood Pressure KIT 1 kit by Does not apply route daily. 1 kit 0  . Docosanol (ABREVA) 10 % CREA Apply topically as needed.    . terbinafine (ATHLETES FOOT AF) 1 % cream Apply 1 application topically 2 (two) times daily. (Patient taking differently: Apply 1 application topically 2 (two) times daily as needed. ) 30 g 0   No facility-administered medications prior to visit.    Allergies  Allergen Reactions  . Atorvastatin     Myalgias    ROS Review of Systems  Constitutional: Negative.   Eyes: Negative.   Respiratory: Negative.   Cardiovascular: Negative.   Endocrine: Negative.   Neurological: Negative.   Hematological: Negative.   Psychiatric/Behavioral: Negative.       Objective:    Physical Exam HENT:     Head: Normocephalic and atraumatic.  Eyes:     Pupils: Pupils are equal, round, and reactive to light.   Cardiovascular:     Rate and Rhythm: Normal rate and regular rhythm.     Pulses: Normal pulses.     Heart sounds: Normal heart sounds.  Pulmonary:     Effort: Pulmonary effort is normal.     Breath sounds: Normal breath sounds.  Skin:    General: Skin is warm.  Neurological:     General: No focal deficit present.     Mental Status: He is alert and oriented to person, place, and time. Mental status is at baseline.  Psychiatric:        Mood and Affect: Mood normal.        Behavior: Behavior normal.        Thought Content: Thought content normal.        Judgment: Judgment normal.     BP 117/82 (BP Location: Left Arm, Patient Position: Sitting, Cuff Size: Large)   Pulse 75   Temp (!) 96.4 F (35.8 C)   Resp 16   Wt 256 lb 4.8 oz (116.3 kg)   SpO2 94%   BMI 38.97 kg/m  Wt Readings from Last 3 Encounters:  03/28/20 256 lb 4.8 oz (116.3 kg)  03/14/20 258 lb 2 oz (117.1 kg)  03/02/20 260 lb 5.8 oz (118.1 kg)   He lost 2 pounds in 2 weeks and was encouraged to continue on his weight loss regimen.  Health Maintenance Due  Topic Date Due  . Hepatitis C Screening  Never done  . TETANUS/TDAP  Never done  . COLONOSCOPY  Never done    There are no preventive care reminders to display for this patient.  Lab Results  Component Value Date   TSH 1.624 12/22/2014   Lab Results  Component Value Date   WBC 6.8 03/02/2020   HGB 14.7 03/02/2020   HCT 42.8 03/02/2020   MCV 93.2 03/02/2020   PLT 192 03/02/2020   Lab Results  Component Value Date   NA 141 03/08/2020   K 4.3 03/08/2020   CO2 24 03/08/2020   GLUCOSE 91 03/08/2020   BUN 11 03/08/2020  CREATININE 1.06 03/08/2020   BILITOT 0.4 03/08/2020   ALKPHOS 68 03/08/2020   AST 20 03/08/2020   ALT 35 03/08/2020   PROT 6.8 03/08/2020   ALBUMIN 4.6 03/08/2020   CALCIUM 9.6 03/08/2020   ANIONGAP 7 03/02/2020   Lab Results  Component Value Date   CHOL 112 03/08/2020   Lab Results  Component Value Date   HDL 40  03/08/2020   Lab Results  Component Value Date   LDLCALC 51 03/08/2020   Lab Results  Component Value Date   TRIG 113 03/08/2020   Lab Results  Component Value Date   CHOLHDL 2.8 03/08/2020   Lab Results  Component Value Date   HGBA1C 6.0 (H) 03/08/2020      Assessment & Plan:   1. Prediabetes -His HgbA1c was 6%, he will continue on Metformin, was educated on medication side effects and to notify clinic. He will continue on low carb and non concentrated sweet diet and exercise as tolerated. - metFORMIN (GLUCOPHAGE) 1000 MG tablet; Take 0.5 tablets (500 mg total) by mouth daily with breakfast.  Dispense: 30 tablet; Refill: 0  2. Health care maintenance  - Flu Vaccine QUAD 6+ mos PF IM (Fluarix Quad PF) was administered.     Follow-up: Return in about 22 days (around 04/19/2020), or if symptoms worsen or fail to improve.    Shaunda Tipping Jerold Coombe, NP

## 2020-04-04 ENCOUNTER — Ambulatory Visit: Payer: Medicaid Other | Admitting: Licensed Clinical Social Worker

## 2020-04-04 ENCOUNTER — Telehealth: Payer: Self-pay

## 2020-04-04 NOTE — Telephone Encounter (Signed)
Called patient to reschedule his appointment with Rhett Bannister.  Patients voicemail was not set up so social work intern could not leave a message.

## 2020-04-06 ENCOUNTER — Telehealth: Payer: Self-pay

## 2020-04-06 NOTE — Telephone Encounter (Signed)
Social work Tax inspector called patient to see if he would like to come in before Thanksgiving for a 30 minute appointment on the 17th to see Rhett Bannister but voicemail had not been set up yet.

## 2020-04-06 NOTE — Telephone Encounter (Signed)
Called patient to reschedule him for an appointment to see Rhett Bannister twice and patient called back and social work intern scheduled him for an appointment.

## 2020-04-11 ENCOUNTER — Telehealth: Payer: Self-pay

## 2020-04-11 ENCOUNTER — Other Ambulatory Visit: Payer: Self-pay | Admitting: Gerontology

## 2020-04-11 DIAGNOSIS — F341 Dysthymic disorder: Secondary | ICD-10-CM

## 2020-04-11 MED ORDER — DULOXETINE HCL 30 MG PO CPEP: 30.0000 mg | ORAL_CAPSULE | Freq: Two times a day (BID) | ORAL | 0 refills | Status: AC

## 2020-04-11 NOTE — Telephone Encounter (Signed)
Called patient and rescheduled him to see Rhett Bannister.

## 2020-04-18 ENCOUNTER — Ambulatory Visit: Payer: Medicaid Other | Admitting: Licensed Clinical Social Worker

## 2020-04-18 ENCOUNTER — Telehealth: Payer: Self-pay | Admitting: Pharmacist

## 2020-04-18 ENCOUNTER — Telehealth: Payer: Self-pay

## 2020-04-18 NOTE — Telephone Encounter (Signed)
04/18/2020 9:47:43 AM - Cymbalta refill called to [facility]  -- Rhetta Mura - Tuesday, April 18, 2020 9:45 AM --Jeanene Erb [facility] spoke with [place] for refill on Cymbalta - placed refill, allow 3-5 business days to receive--allow extra for holiday, lastest should be 05/08/2020.

## 2020-04-18 NOTE — Telephone Encounter (Addendum)
Social work Tax inspector called patient two times to provide him with a referral to either [facility] at [address], [address], [place], [address] or [facility].  Dr. Orpah Melter preference is that he go to [facility].  Patient's voicemail had not been set up yet so social work intern was unable to leave a message.

## 2020-04-19 ENCOUNTER — Ambulatory Visit: Payer: Medicaid Other | Admitting: Gerontology

## 2020-04-20 NOTE — Telephone Encounter (Signed)
Social work Tax inspector called patient twice to provide him with a referral to either [facility] at [address], [place], [address] or [facility].  Dr. Orpah Melter preference is that he go to [facility].  Patient's voicemail had not been set up yet so social work intern was unable to leave a message.

## 2020-04-25 NOTE — Telephone Encounter (Signed)
Social work Tax inspector called patient twice to provide him with a referral to either [facility] or [facility] but paitient's voicemail had not been set up yet so no message could be left.

## 2020-04-26 ENCOUNTER — Ambulatory Visit: Payer: Medicaid Other | Admitting: Adult Health

## 2020-05-01 ENCOUNTER — Other Ambulatory Visit: Payer: Self-pay | Admitting: Gerontology

## 2020-05-01 DIAGNOSIS — F341 Dysthymic disorder: Secondary | ICD-10-CM

## 2020-05-03 ENCOUNTER — Ambulatory Visit: Payer: Medicaid Other | Admitting: Licensed Clinical Social Worker

## 2020-05-04 ENCOUNTER — Institutional Professional Consult (permissible substitution): Payer: Disability Insurance | Admitting: Pulmonary Disease

## 2020-05-17 ENCOUNTER — Ambulatory Visit: Payer: Disability Insurance | Admitting: Cardiovascular Disease

## 2020-05-24 ENCOUNTER — Telehealth: Payer: Self-pay | Admitting: Cardiovascular Disease

## 2020-05-24 DIAGNOSIS — I10 Essential (primary) hypertension: Secondary | ICD-10-CM

## 2020-05-24 MED ORDER — LISINOPRIL 20 MG PO TABS: 20.0000 mg | ORAL_TABLET | Freq: Every day | ORAL | 3 refills | Status: DC

## 2020-05-24 MED ORDER — EZETIMIBE 10 MG PO TABS: 10.0000 mg | ORAL_TABLET | Freq: Every day | ORAL | 3 refills | Status: AC

## 2020-05-24 MED ORDER — ROSUVASTATIN CALCIUM 40 MG PO TABS: 40.0000 mg | ORAL_TABLET | Freq: Every day | ORAL | 3 refills | Status: DC

## 2020-05-24 NOTE — Telephone Encounter (Signed)
Medication Management calling  Med Mgt. is unable to fill medications because patient is insured now and he is running out of meds Would like to know if we can send in new prescriptions for Zetia  pantoprazole Gabapentin  lisinopril Rosuvastatin  Cymbalta  To [facility] on [address]  Please review and advise

## 2020-05-24 NOTE — Telephone Encounter (Signed)
Requested Prescriptions   Signed Prescriptions Disp Refills   ezetimibe (ZETIA) 10 MG tablet 90 tablet 3    Sig: Take 1 tablet (10 mg total) by mouth daily.    Authorizing Provider: Antonieta Iba    Ordering User: Iverson Alamin C   lisinopril (ZESTRIL) 20 MG tablet 90 tablet 3    Sig: Take 1 tablet (20 mg total) by mouth daily.    Authorizing Provider: Antonieta Iba    Ordering User: Iverson Alamin C   rosuvastatin (CRESTOR) 40 MG tablet 90 tablet 3    Sig: Take 1 tablet (40 mg total) by mouth daily.    Authorizing Provider: Antonieta Iba    Ordering User: Kendrick Fries   I contacted pt to verify if he has a PCP to refill his additional non cardiac medications listed below:  Protonix Gabapentin Cymbalta  There was no answer or VM setup.

## 2020-05-29 NOTE — Progress Notes (Signed)
Patient ID: Bryce [name], male    DOB: 01-Jul-1965, 54 y.o.   MRN: [id number]   Bryce [name] is a 54 y/o male with a history of CAD (MI), HTN, GERD, tobacco use and chronic heart failure.   Echo report from 04/26/2019 reviewed and showed an EF of 45-50%. Echo report from 02/03/17 reviewed and showed an EF of 45-50% along with mild inferior hypokinesis.   LHC completed 03/02/20 and showed:  Non-stenotic Mid LAD lesion was previously treated.  Mid RCA lesion is 40% stenosed.  Previously placed Dist Cx-1 drug eluting stent is widely patent.  Balloon angioplasty was performed.  Dist Cx-2 lesion is 40% stenosed.  The left ventricular ejection fraction is 45-50% by visual estimate.  LV end diastolic pressure is normal.  There is mild left ventricular systolic dysfunction.  There is no mitral valve regurgitation.  Catheterization done 02/02/17 which showed:  Mid LAD lesion, 0 %stenosed.  Mid RCA lesion, 40 %stenosed.  A STENT XIENCE ALPINE RX 3.0X23 drug eluting stent was successfully placed, and does not overlap previously placed stent.  Dist Cx lesion, 100 %stenosed.  Post intervention, there is a 0% residual stenosis.  There is mild left ventricular systolic dysfunction.  LV end diastolic pressure is mildly elevated.  The left ventricular ejection fraction is 50-55% by visual estimate. Conclusion Successful PCI and stent to mid to distal circumflex with DES stent 3.023 mm xience Reducing the lesion from 100% down to 0 point TIMI-3 from 0 back to 3.  Admitted 02/29/20 due to chest pain due to unstable angina. Cardiology consult obtained. LHC completed (Results above). Medications continued. Discharged after 2 days.   Bryce [name] presents today for a follow-up visit with a chief complaint of moderate fatigue upon minimal exertion. Describes this as chronic in nature having been present for several years. Bryce [name] has associated shortness of breath, headaches (with isosorbide),  light-headedness and depression (at times). Bryce [name] denies any difficulty sleeping, abdominal distention, palpitations, pedal edema, chest pain, cough or weight gain.                Past Medical History:  Diagnosis Date  . ASD (atrial septal defect)    a. 04/2019 Echo: small ASD w/ predominantly L->R shunting.  . Coronary artery disease    a. 11/2010 NSTEMI/PCI: BMS -->LAD; b. 02/2017 Lateral STEMI/PCI: LM nl, LAD patent stent, LCX 100 (3.0x23 Xience Alpine DES), RCA 85m EF 50%; c. 11/2018 MV: Fixed apical and periapical defect (? over-processing). No significant ischemia. EF 48%.  .Marland KitchenGERD (gastroesophageal reflux disease)   . 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 04/2019 Echo: EF 45-50%, mild LVH. Mildly dil LA. Small ASA w/ predominantly L->R shunting.  .Marland KitchenHyperlipidemia LDL goal <70   . Hypertension   . Ischemic cardiomyopathy    a. 04/2019 Echo: EF 45-50%.  .Marland KitchenLBBB (left bundle branch block)   . MI ([place]ocardial infarction) ([facility]   . Migraine    Past Surgical History:  Procedure Laterality Date  . CARDIAC CATHETERIZATION N/A 12/28/2014   Procedure: Left Heart Cath and Coronary Angiography;  Surgeon: DYolonda Kida MD;  [facility];  Service: Cardiovascular;  Laterality: N/A;  . CORONARY ANGIOPLASTY     Non-ST elevation MI status post stenting within bare-metal stent of the 75% lesion of the LAD, 11/13/2010.   . CORONARY STENT INTERVENTION N/A 02/02/2017   Procedure: CORONARY/GRAFT ACUTE MI REVASCULARIZATION;  Surgeon: CYolonda Kida MD;  [facility];  Service: Cardiovascular;  Laterality: N/A;  . LEFT HEART CATH AND CORONARY ANGIOGRAPHY N/A 02/02/2017   Procedure: LEFT HEART CATH AND CORONARY ANGIOGRAPHY;  Surgeon: Yolonda Kida, MD;  Location: [facility];  Service: Cardiovascular;  Laterality: N/A;  . LEFT HEART CATH AND CORONARY ANGIOGRAPHY N/A 03/02/2020   Procedure: LEFT HEART CATH AND CORONARY ANGIOGRAPHY;   Surgeon: Minna Merritts, MD;  Location: [facility];  Service: Cardiovascular;  Laterality: N/A;   Family History  Problem Relation Age of Onset  . CAD Father   . Kidney disease Neg Hx   . Diabetes Mellitus II Neg Hx    Social History   Tobacco Use  . Smoking status: Current Some Day Smoker    Years: 5.00    Types: Cigarettes    Last attempt to quit: 01/05/2017    Years since quitting: 3.3  . Smokeless tobacco: Never Used  . Tobacco comment: Typically 1 pack a week but recently sometimes 2 packs 10/18/19  Substance Use Topics  . Alcohol use: Not Currently    Alcohol/week: 1.0 standard drink    Types: 1 Cans of beer per week    Comment: Social Drinker   Allergies  Allergen Reactions  . Atorvastatin     Myalgias   Prior to Admission medications   Medication Sig Start Date End Date Taking? Authorizing Provider  aspirin EC 81 MG EC tablet Take 1 tablet (81 mg total) by mouth daily. 02/05/17  Yes Epifanio Lesches, MD  aspirin-acetaminophen-caffeine (EXCEDRIN MIGRAINE) [phone] MG tablet Take 1 tablet by mouth daily as needed for headache. Patient taking differently: Take 2 tablets by mouth daily as needed for headache. 01/06/19  Yes Iloabachie, Chioma E, NP  aspirin-acetaminophen-caffeine (EXCEDRIN MIGRAINE) 250-250-65 MG tablet Take by mouth as needed for headache.   Yes [provider]  Blood Pressure KIT 1 kit by Does not apply route daily. 04/01/19  Yes Iloabachie, Chioma E, NP  clopidogrel (PLAVIX) 75 MG tablet Take 1 tablet (75 mg total) by mouth daily. 03/14/20  Yes Gollan, Kathlene November, MD  Docosanol (ABREVA) 10 % CREA Apply 1 application topically 2 (two) times daily. Patient taking differently: Apply 1 application topically 2 (two) times daily as needed. 01/15/18  Yes Dustin Flock, MD  Docosanol 10 % CREA Apply topically as needed.   Yes [provider]  DULoxetine (CYMBALTA) 30 MG capsule Take 1 capsule (30 mg total) by mouth 2 (two) times  daily. 04/11/20 05/11/20 Yes Iloabachie, Chioma E, NP  ezetimibe (ZETIA) 10 MG tablet Take 1 tablet (10 mg total) by mouth daily. 05/24/20  Yes Gollan, Kathlene November, MD  gabapentin (NEURONTIN) 100 MG capsule TAKE ONE CAPSULE BY MOUTH 3 TIMES A DAY 02/22/20  Yes Iloabachie, Chioma E, NP  isosorbide mononitrate (IMDUR) 30 MG 24 hr tablet Take 1 tablet (30 mg total) by mouth daily. 03/14/20 04/13/20 Yes Gollan, Kathlene November, MD  lisinopril (ZESTRIL) 20 MG tablet Take 1 tablet (20 mg total) by mouth daily. 05/24/20  Yes Minna Merritts, MD  metFORMIN (GLUCOPHAGE) 1000 MG tablet Take 0.5 tablets (500 mg total) by mouth daily with breakfast. 03/28/20  Yes Iloabachie, Chioma E, NP  metoprolol tartrate (LOPRESSOR) 25 MG tablet TAKE ONE TABLET BY MOUTH 2 TIMES A DAY 03/14/20  Yes Gollan, Kathlene November, MD  nitroGLYCERIN (NITROSTAT) 0.4 MG SL tablet Place 1 tablet (0.4 mg total) under the tongue every 5 (five) minutes x 3 doses as needed for chest pain.  04/06/19  Yes Dunn, Ryan M, PA-C  pantoprazole (PROTONIX) 40 MG tablet TAKE ONE TABLET BY MOUTH EVERY DAY 10/28/19  Yes Iloabachie, Chioma E, NP  potassium chloride (KLOR-CON) 10 MEQ tablet Take 2 tablets (20 mEq total) by mouth 2 (two) times daily. Take with fluid pill 03/14/20  Yes Gollan, Kathlene November, MD  rosuvastatin (CRESTOR) 40 MG tablet Take 1 tablet (40 mg total) by mouth daily. 05/24/20 08/22/20 Yes Gollan, Kathlene November, MD  terbinafine (ATHLETES FOOT AF) 1 % cream Apply 1 application topically 2 (two) times daily. Patient taking differently: Apply 1 application topically 2 (two) times daily as needed. 05/26/19  Yes Merlyn Lot, MD  terbinafine (LAMISIL) 1 % cream Apply 1 application topically 2 (two) times daily as needed.   Yes [provider]  torsemide (DEMADEX) 20 MG tablet Take 20 mg by mouth 2 (two) times daily as needed.   Yes [provider]  torsemide (DEMADEX) 20 MG tablet Take 1 tablet (20 mg total) by mouth as directed. 39m daily and  additional 241mPM PRN 03/14/20 06/12/20 Yes Gollan, TiKathlene NovemberMD   Review of Systems  Constitutional: Positive for fatigue (tire easily). Negative for appetite change.  HENT: Negative for congestion, postnasal drip and sore throat.   Eyes: Negative.   Respiratory: Positive for shortness of breath. Negative for cough.   Cardiovascular: Negative for chest pain, palpitations and leg swelling.  Gastrointestinal: Negative for abdominal distention and abdominal pain.  Endocrine: Negative.   Genitourinary: Negative.   Musculoskeletal: Negative for back pain and neck pain.  Skin: Negative.   Allergic/Immunologic: Negative.   Neurological: Positive for light-headedness (with position changes) and headaches. Negative for dizziness.  Hematological: Negative for adenopathy. Does not bruise/bleed easily.  Psychiatric/Behavioral: Positive for dysphoric mood. Negative for sleep disturbance (sleeping on 2 pillows).   Vitals:   05/31/20 0854  BP: 124/80  Pulse: 62  Resp: 20  SpO2: 97%  Weight: 254 lb 6 oz (115.4 kg)  Height: _0  (1.727 m)   Wt Readings from Last 3 Encounters:  05/31/20 254 lb 6 oz (115.4 kg)  03/28/20 256 lb 4.8 oz (116.3 kg)  03/14/20 258 lb 2 oz (117.1 kg)   Lab Results  Component Value Date   CREATININE 1.06 03/08/2020   CREATININE 1.11 03/02/2020   CREATININE 0.94 02/29/2020     Physical Exam Vitals and nursing note reviewed.  Constitutional:      Appearance: Normal appearance.  HENT:     Head: Normocephalic and atraumatic.  Cardiovascular:     Rate and Rhythm: Normal rate and regular rhythm.  Pulmonary:     Effort: Pulmonary effort is normal. No respiratory distress.     Breath sounds: No wheezing or rales.  Abdominal:     General: There is no distension.     Palpations: Abdomen is soft.  Musculoskeletal:        General: No tenderness.     Cervical back: Normal range of motion and neck supple.     Right lower leg: No tenderness. No edema.     Left  lower leg: No tenderness. No edema.  Skin:    General: Skin is warm and dry.  Neurological:     General: No focal deficit present.     Mental Status: Bryce [name] is alert and oriented to person, place, and time.  Psychiatric:        Mood and Affect: Mood normal.        Behavior: Behavior normal.  Thought Content: Thought content normal.      Assessment & Plan:  1: Chronic heart failure with mildly reduced ejection fraction- - NYHA class III - euvolemic today - weighing daily and Bryce [name] was reminded to call for an overnight weight gain of >2 pounds or a weekly weight gain of >5 pounds - weight down 4 pounds from last visit here 3 months ago - saw cardiology Rockey Situ) 03/14/20 - BNP 02/29/20 was 20.2 - reports receiving his flu vaccine along with first 2 covid vaccines  2: HTN- - BP looks good today - saw PCP (Iloabachie) 03/28/20 - BMP 03/08/2020 reviewed and showed sodium 141, potassium 4.3, creatinine 1.06 and GFR 79  3. Tobacco and Alcohol use - hasn't smoked since admission back in September 2021 - denies alcohol use   Patient did not bring his medications nor a list. Each medication was verbally reviewed with the patient and Bryce [name] was encouraged to bring the bottles to every visit to confirm accuracy of list.  Return in 6 months or sooner for any questions/problems before then.

## 2020-05-31 ENCOUNTER — Other Ambulatory Visit: Payer: Self-pay

## 2020-05-31 ENCOUNTER — Encounter: Payer: Self-pay | Admitting: Family

## 2020-05-31 ENCOUNTER — Ambulatory Visit: Payer: Medicaid Other | Attending: Family | Admitting: Family

## 2020-05-31 VITALS — BP 124/80 | HR 62 | Resp 20 | Ht 68.0 in | Wt 254.4 lb

## 2020-05-31 DIAGNOSIS — I110 Hypertensive heart disease with heart failure: Secondary | ICD-10-CM | POA: Diagnosis not present

## 2020-05-31 DIAGNOSIS — Z79899 Other long term (current) drug therapy: Secondary | ICD-10-CM | POA: Diagnosis not present

## 2020-05-31 DIAGNOSIS — I447 Left bundle-branch block, unspecified: Secondary | ICD-10-CM | POA: Diagnosis not present

## 2020-05-31 DIAGNOSIS — E785 Hyperlipidemia, unspecified: Secondary | ICD-10-CM | POA: Diagnosis not present

## 2020-05-31 DIAGNOSIS — Z7982 Long term (current) use of aspirin: Secondary | ICD-10-CM | POA: Diagnosis not present

## 2020-05-31 DIAGNOSIS — I10 Essential (primary) hypertension: Secondary | ICD-10-CM

## 2020-05-31 DIAGNOSIS — Z87891 Personal history of nicotine dependence: Secondary | ICD-10-CM | POA: Diagnosis not present

## 2020-05-31 DIAGNOSIS — I252 Old myocardial infarction: Secondary | ICD-10-CM | POA: Insufficient documentation

## 2020-05-31 DIAGNOSIS — Z7902 Long term (current) use of antithrombotics/antiplatelets: Secondary | ICD-10-CM | POA: Diagnosis not present

## 2020-05-31 DIAGNOSIS — I509 Heart failure, unspecified: Secondary | ICD-10-CM | POA: Diagnosis present

## 2020-05-31 DIAGNOSIS — I255 Ischemic cardiomyopathy: Secondary | ICD-10-CM | POA: Insufficient documentation

## 2020-05-31 DIAGNOSIS — I2510 Atherosclerotic heart disease of native coronary artery without angina pectoris: Secondary | ICD-10-CM | POA: Insufficient documentation

## 2020-05-31 DIAGNOSIS — I5020 Unspecified systolic (congestive) heart failure: Secondary | ICD-10-CM

## 2020-05-31 DIAGNOSIS — Z955 Presence of coronary angioplasty implant and graft: Secondary | ICD-10-CM | POA: Insufficient documentation

## 2020-05-31 DIAGNOSIS — Z8249 Family history of ischemic heart disease and other diseases of the circulatory system: Secondary | ICD-10-CM | POA: Diagnosis not present

## 2020-05-31 DIAGNOSIS — Z720 Tobacco use: Secondary | ICD-10-CM

## 2020-05-31 NOTE — Patient Instructions (Signed)
Continue weighing daily and call for an overnight weight gain of > 2 pounds or a weekly weight gain of >5 pounds. 

## 2020-07-03 ENCOUNTER — Telehealth: Payer: Self-pay | Admitting: Cardiovascular Disease

## 2020-07-03 DIAGNOSIS — I5022 Chronic systolic (congestive) heart failure: Secondary | ICD-10-CM

## 2020-07-03 MED ORDER — ROSUVASTATIN CALCIUM 40 MG PO TABS: 40.0000 mg | ORAL_TABLET | Freq: Every day | ORAL | 3 refills | Status: AC

## 2020-07-03 MED ORDER — TORSEMIDE 20 MG PO TABS
20.0000 mg | ORAL_TABLET | ORAL | 3 refills | Status: DC
Start: 2020-07-03 — End: 2020-11-27

## 2020-07-03 MED ORDER — POTASSIUM CHLORIDE CRYS ER 10 MEQ PO TBCR: 20.0000 meq | EXTENDED_RELEASE_TABLET | Freq: Two times a day (BID) | ORAL | 3 refills | Status: DC

## 2020-07-03 NOTE — Telephone Encounter (Signed)
Rx request sent to pharmacy. Pt is not on Furosemide. Pt taking Torsemide 20 mg BID PRN.

## 2020-07-03 NOTE — Telephone Encounter (Signed)
*  STAT* If patient is at the pharmacy, call can be transferred to refill team.   1. Which medications need to be refilled? (please list name of each medication and dose if known)   Potassium chloride 10 mEq po BID Rosuvastatin 40 mg po q d  Furosemide   2. Which pharmacy/location (including street and city if local pharmacy) is medication to be sent to? [facility]   3. Do they need a 30 day or 90 day supply? 90

## 2020-07-06 ENCOUNTER — Other Ambulatory Visit: Payer: Self-pay | Admitting: Family

## 2020-07-06 DIAGNOSIS — I10 Essential (primary) hypertension: Secondary | ICD-10-CM

## 2020-07-06 MED ORDER — LISINOPRIL 20 MG PO TABS: 20.0000 mg | ORAL_TABLET | Freq: Every day | ORAL | 3 refills | Status: AC

## 2020-07-06 MED ORDER — METOPROLOL TARTRATE 25 MG PO TABS: ORAL_TABLET | ORAL | 3 refills | Status: AC

## 2020-07-10 ENCOUNTER — Other Ambulatory Visit: Payer: Self-pay

## 2020-07-10 DIAGNOSIS — I213 ST elevation (STEMI) myocardial infarction of unspecified site: Secondary | ICD-10-CM

## 2020-07-10 MED ORDER — CLOPIDOGREL BISULFATE 75 MG PO TABS: 75.0000 mg | ORAL_TABLET | Freq: Every day | ORAL | 3 refills | Status: AC

## 2020-07-10 NOTE — Telephone Encounter (Signed)
Per Dr. Mariah Milling, okay to refill Plavix. Message was sent from [facility]. Refill sent in at this time.

## 2020-07-19 ENCOUNTER — Telehealth: Payer: Self-pay | Admitting: Pharmacy Technician

## 2020-07-19 NOTE — Telephone Encounter (Signed)
Patient has Medicaid with prescription coverage.  No longer meets [facility]'s eligibility criteria.  Pt notified by letter.  Shaquil Aldana J. Khaiden Segreto Care Manager [facility] 

## 2020-07-19 NOTE — Telephone Encounter (Signed)
  Marcille Blanco [place], [place]  [address]  July 19, 2020    Arzell Mcgeehan [address] [place], [place]  [address]  Dear Thereasa Distance:  This is to inform you that you are no longer eligible to receive medication assistance at [facility].  The reason(s) are:    _____Your total gross monthly household income exceeds 250% of the Federal Poverty Level.   _____Tangible assets (savings, checking, stocks/bonds, pension, retirement, etc.) exceeds our limit  __X__You are eligible to receive benefits from [facility] or HIV Medication            Assistance program _____You are eligible to receive benefits from a Medicare Part "D" plan __X__You have prescription insurance  _____You are not an [facility] resident _____Failure to provide all requested proof of income information for 2022.    We regret that we are unable to help you at this time.  If your prescription coverage is terminated, please contact [facility], so that we may reassess your eligibility for our program.  If you have questions, we may be contacted at [phone].  Thank you,  [facility]

## 2020-10-08 IMAGING — CR DG LUMBAR SPINE 2-3V
1 series · 3 of 3 positions shown · non-contrast
Comparison: MRI 07/24/2017.  Lumbar spine series 07/24/2017.

CLINICAL DATA: Lumbar DJD.

EXAM:
LUMBAR SPINE - 2-3 VIEW

[Series 1: dg lumbar spine 2-3 views · 0.14mm/px · 3 of 3 slices shown]
[im 1/3]
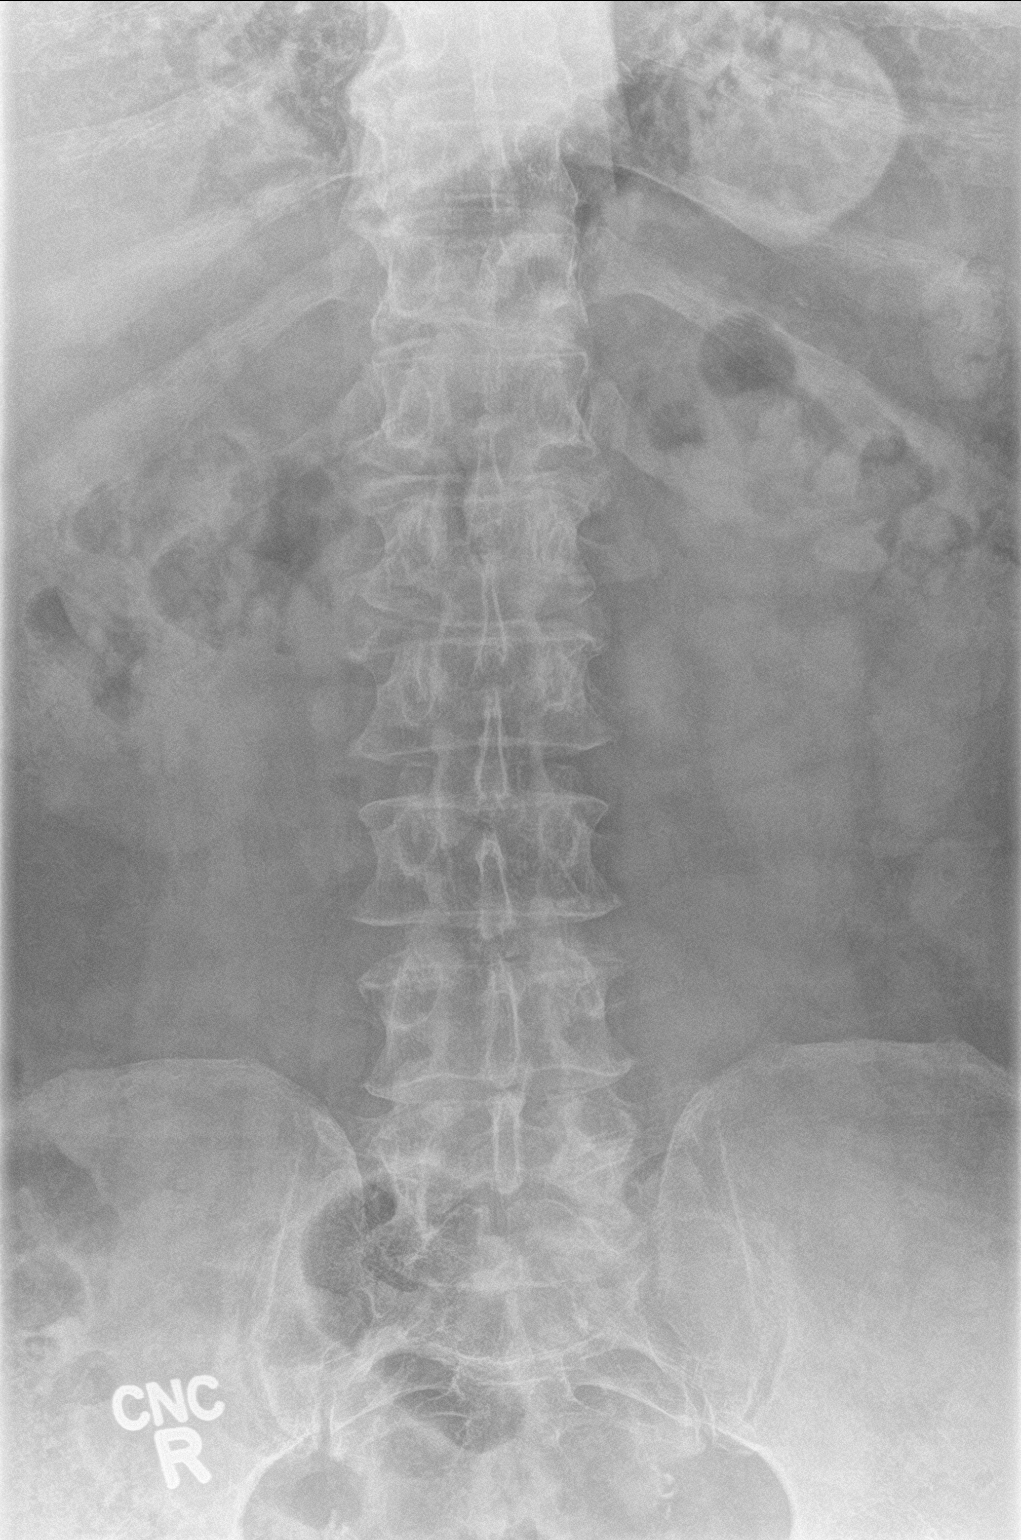
[im 2/3]
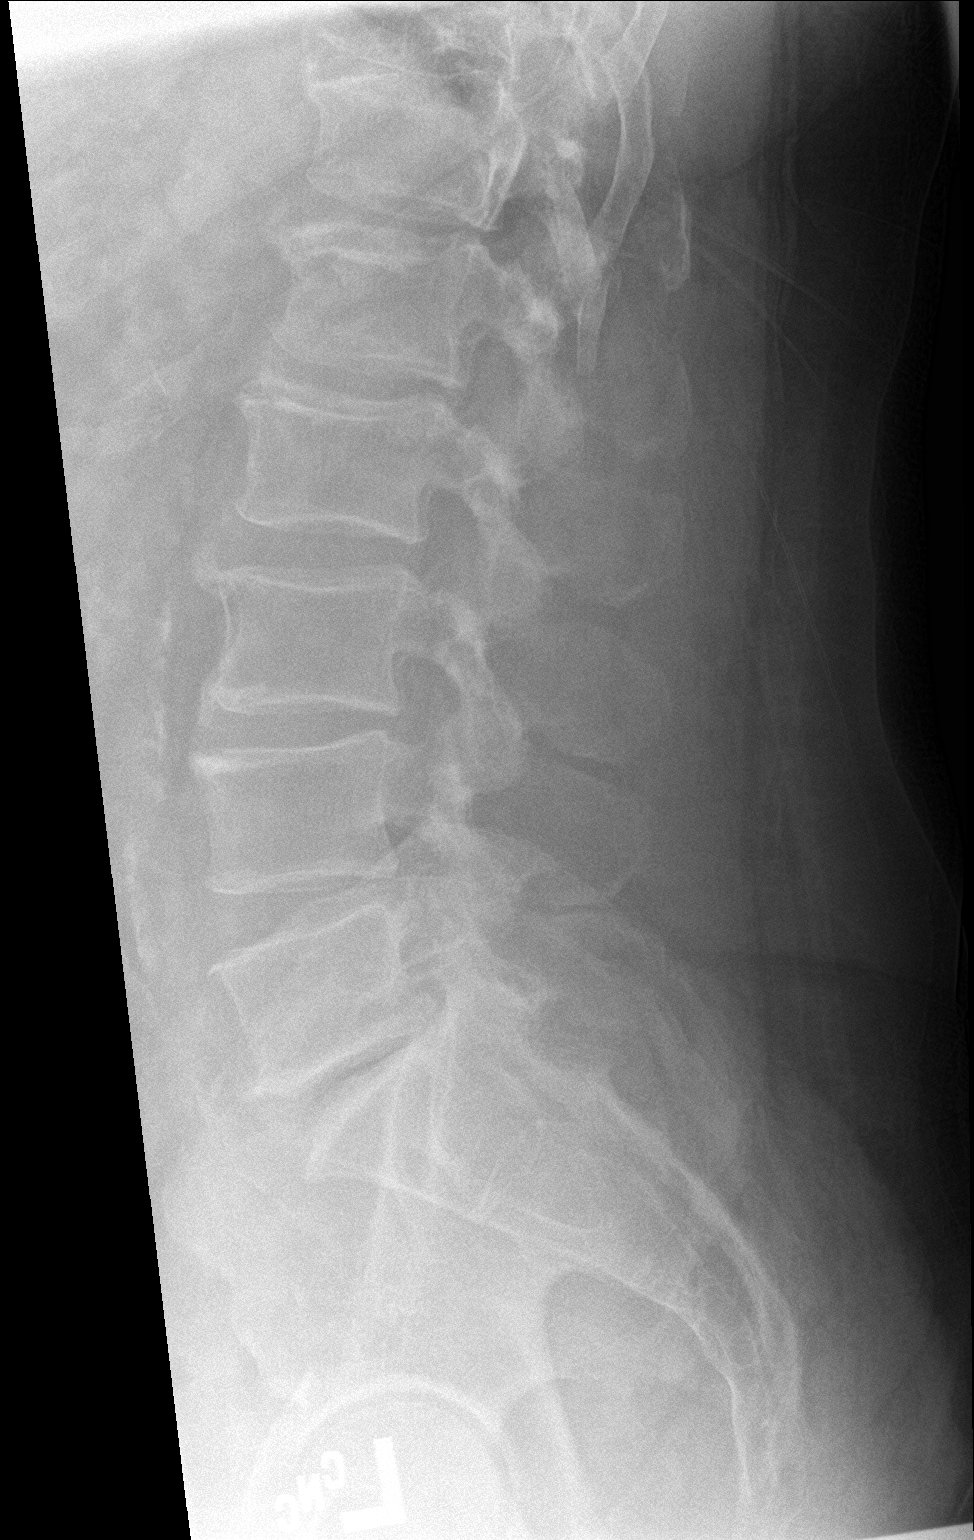
[im 3/3]
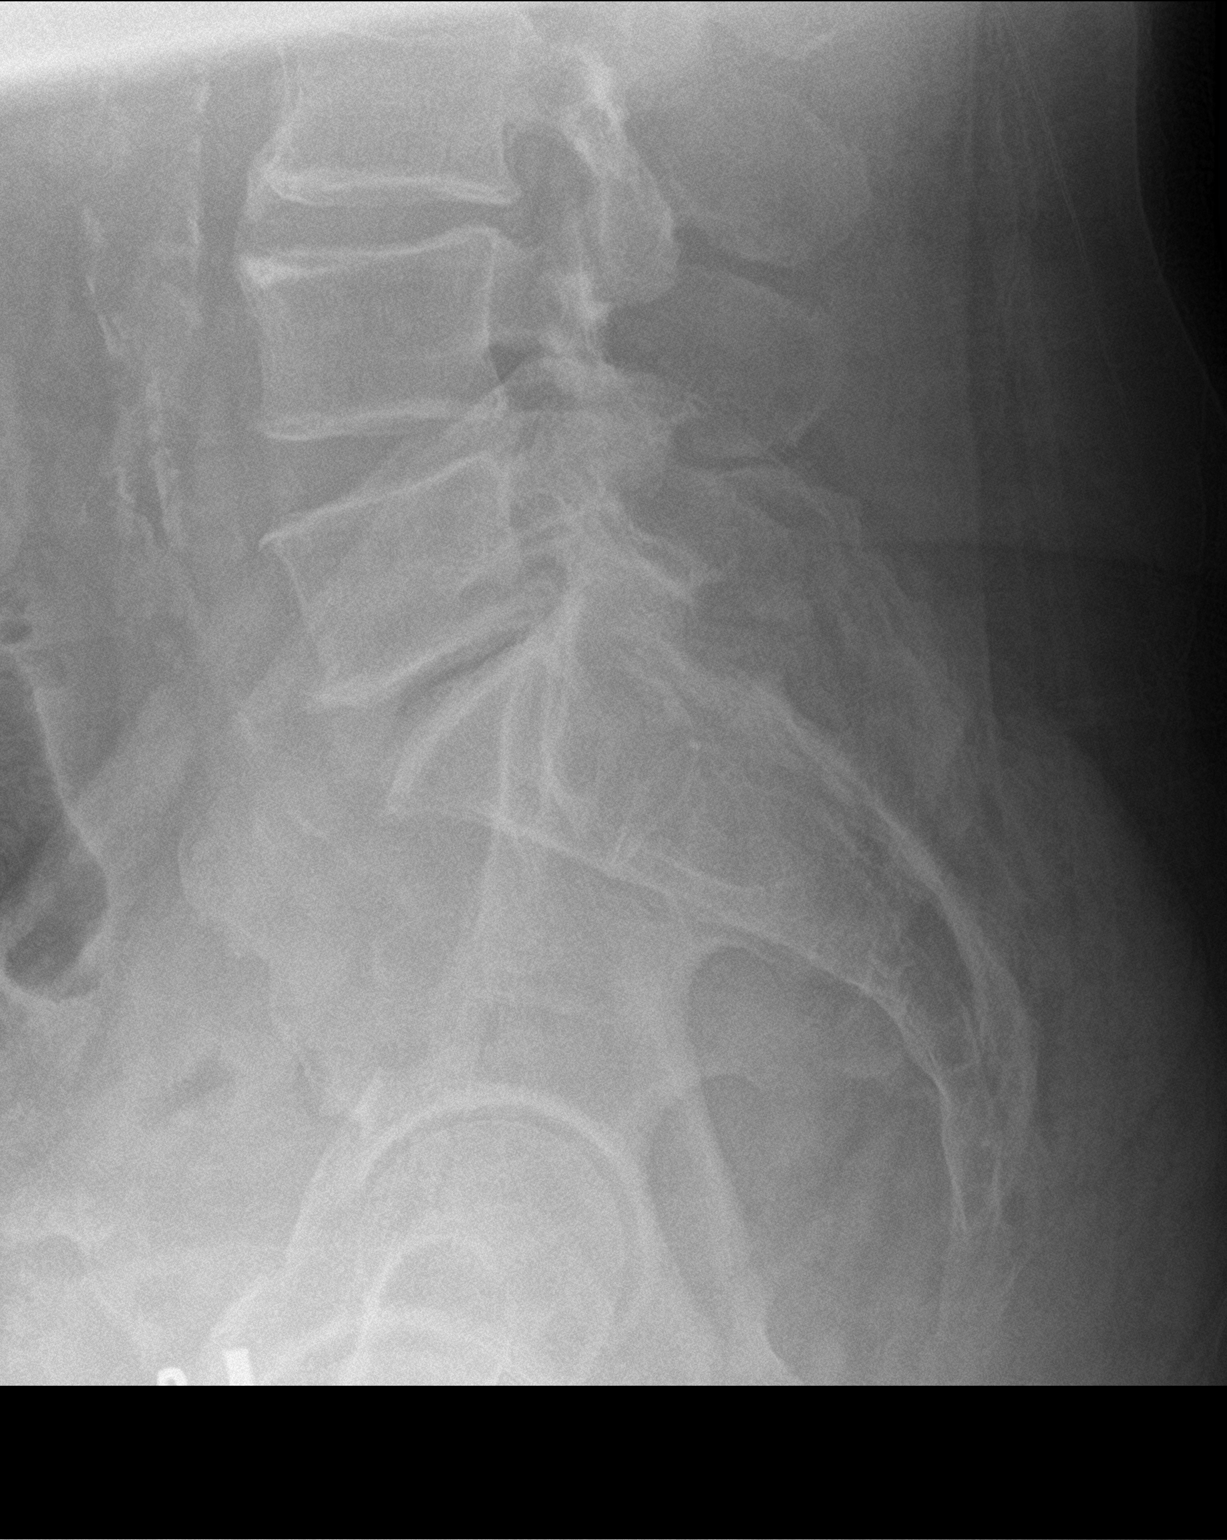

[3 of 3 positions shown; findings below may reference images not displayed]

FINDINGS: Paraspinal soft tissues are normal. Diffuse multilevel degenerative
change again noted without significant change. No acute bony
abnormality. Left base pulmonary infiltrate versus mass. PA lateral
chest x-ray suggested for further evaluation. Aortoiliac
atherosclerotic vascular disease.
IMPRESSION: 1. Diffuse multilevel degenerative changes lumbar spine noted
without significant change. No acute bony abnormality.

2. Left base infiltrate versus mass noted. PA and lateral chest
x-ray suggested for further evaluation.

3.  Aortoiliac atherosclerotic vascular disease.

These results will be called to the ordering clinician or
representative by the Radiologist Assistant, and communication
documented in the PACS or zVision Dashboard.

## 2020-10-29 IMAGING — CT CT CHEST W/ CM
2 of 5 series · 12 of 36 positions shown, 15 images · IV contrast (omnipaque)
Comparison: Chest radiograph dated 04/08/2019.

CLINICAL DATA: 53-year-old male with small bowel obstruction and
bilateral leg edema.

EXAM:
CT CHEST, ABDOMEN, AND PELVIS WITH CONTRAST
TECHNIQUE: Multidetector CT imaging of the chest, abdomen and pelvis was
performed following the standard protocol during bolus
administration of intravenous contrast.
CONTRAST:  100mL OMNIPAQUE IOHEXOL 300 MG/ML  SOLN

[Series 2: axials cap 5.00 · axial · 0.86mm/px · z∈[-1535,-970]mm · 9 of 143 slices shown, 12 images]
[im 15/143  mediastinal]
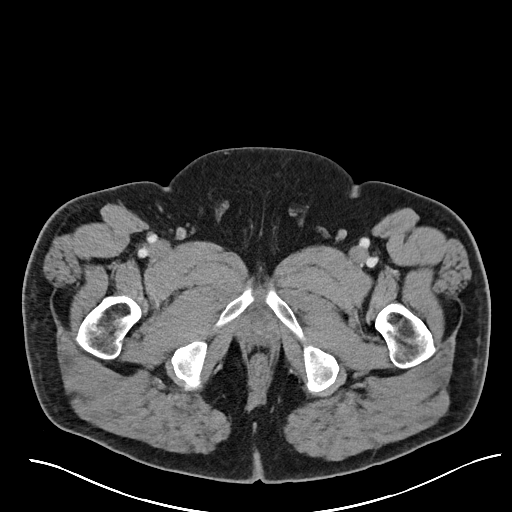
[im 15/143  lung]
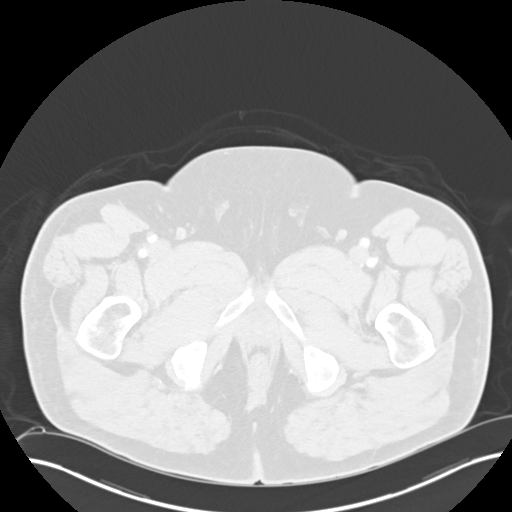
[im 29/143  lung]
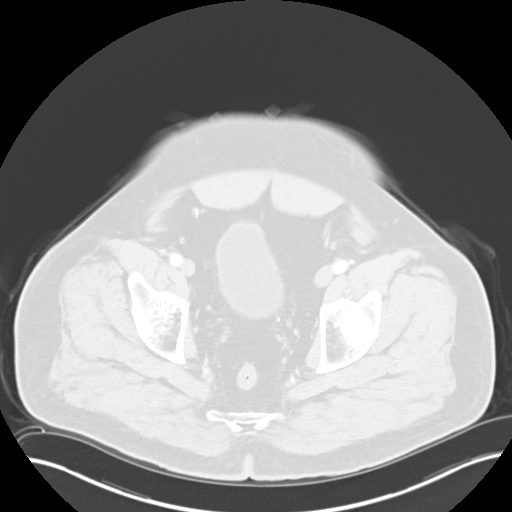
[im 43/143  lung]
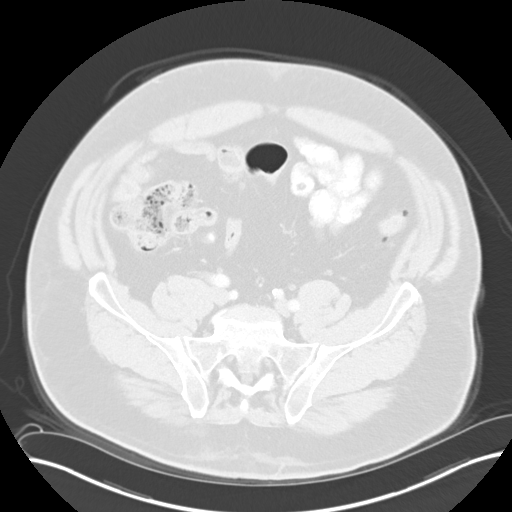
[im 57/143  lung]
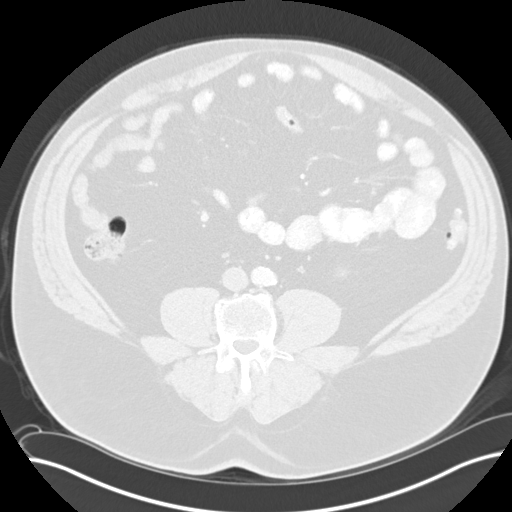
[im 72/143  mediastinal]
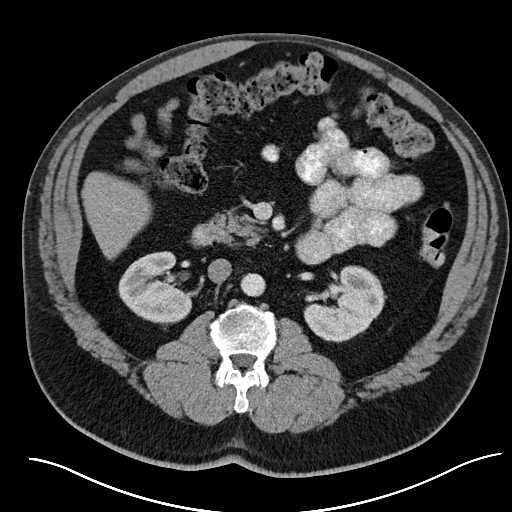
[im 72/143  lung]
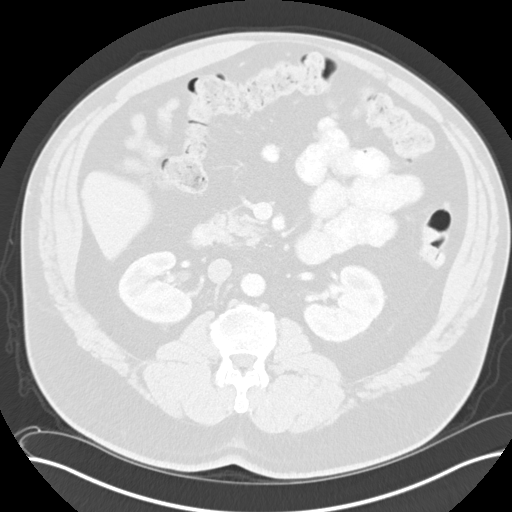
[im 86/143  lung]
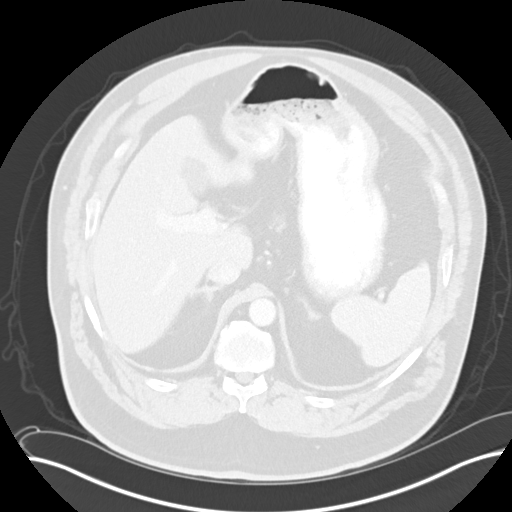
[im 100/143  lung]
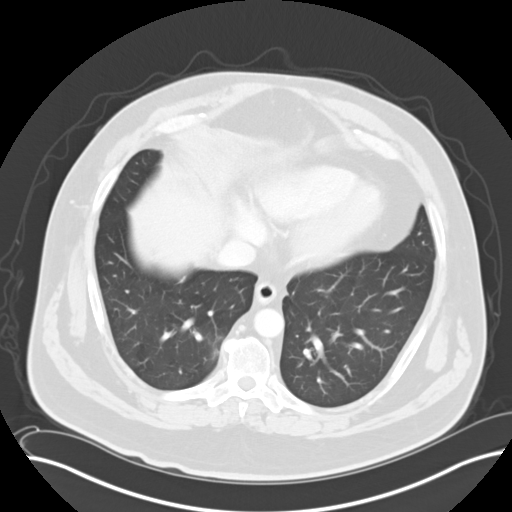
[im 114/143  lung]
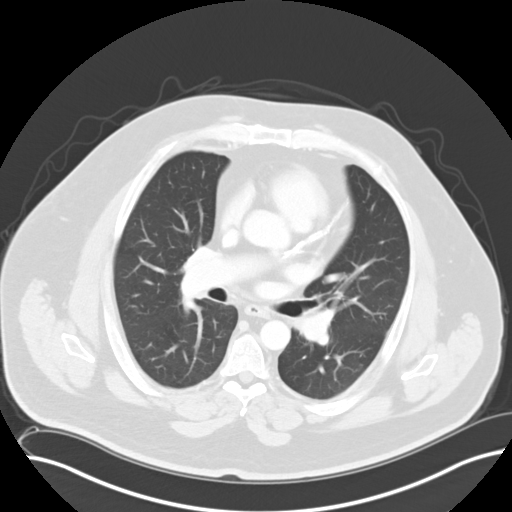
[im 128/143  mediastinal]
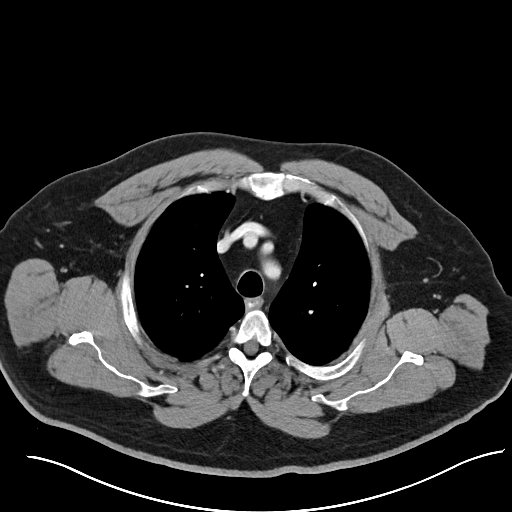
[im 128/143  lung]
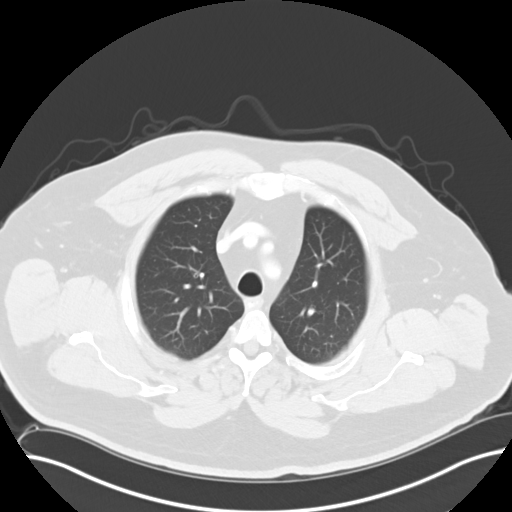

[Series 4: coronals cap 2.00 cor · coronal · 0.86mm/px · 3 of 199 slices shown]
[im 40/199  lung]
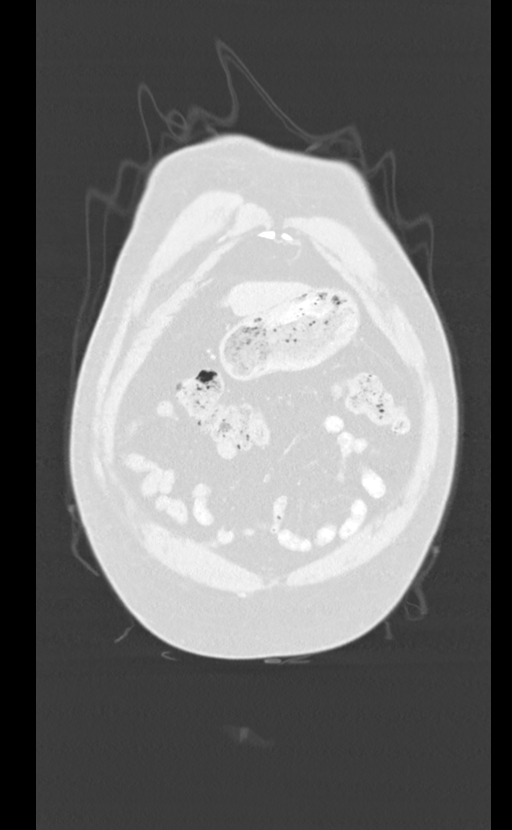
[im 80/199  lung]
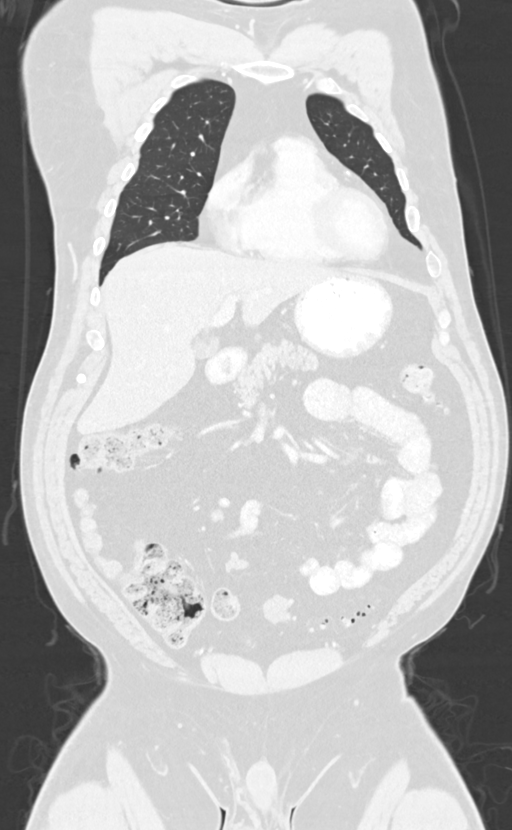
[im 119/199  lung]
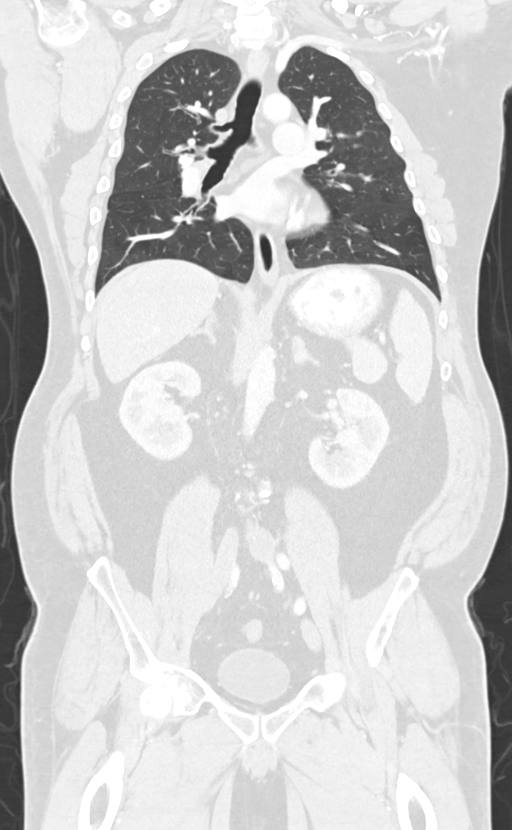

[12 of 36 positions shown; findings below may reference images not displayed]

FINDINGS: CT CHEST FINDINGS

Cardiovascular: Top-normal cardiac size. No pericardial effusion.
Advanced coronary vascular calcification of the LAD. The thoracic
aorta is unremarkable. The origins of the great vessels of the
aortic arch are patent. Mildly dilated main pulmonary trunk
suggestive of pulmonary hypertension. The central pulmonary arteries
appear patent.

Mediastinum/Nodes: No hilar or mediastinal adenopathy. The esophagus
is grossly unremarkable. No mediastinal fluid collection.

Lungs/Pleura: The lungs are clear. There is no pleural effusion or
pneumothorax. The central airways are patent.

Musculoskeletal: Mild degenerative changes of the spine. No acute
osseous pathology.

CT ABDOMEN PELVIS FINDINGS

No intra-abdominal free air or free fluid.

Hepatobiliary: Apparent mild fatty infiltration of the liver. No
intrahepatic biliary ductal dilatation. The gallbladder is
unremarkable.

Pancreas: Unremarkable. No pancreatic ductal dilatation or
surrounding inflammatory changes.

Spleen: Normal in size without focal abnormality.

Adrenals/Urinary Tract: The adrenal glands are unremarkable. There
is no hydronephrosis on either side. There is symmetric enhancement
and excretion of contrast by both kidneys. An 18 mm exophytic
hypodense lesion from the posterior interpolar right kidney is not
well characterized but may represent a cyst. This can be better
evaluated with ultrasound. The visualized ureters and urinary
bladder appear unremarkable.

Stomach/Bowel: There is sigmoid diverticulosis with muscular
hypertrophy. Minimal perisigmoid haziness may be chronic or
represent mild or early acute diverticulitis. Clinical correlation
is recommended. No diverticular abscess or perforation. Additional
scattered colonic diverticula without active inflammatory changes.
There is no bowel obstruction. The appendix is normal.

Vascular/Lymphatic: Mild aortoiliac atherosclerotic disease. The IVC
is unremarkable. No portal venous gas. There is no adenopathy.

Reproductive: The prostate and seminal vesicles are grossly
unremarkable.

Other: Small fat containing umbilical hernia.

Musculoskeletal: Degenerative changes of the spine. Disc desiccation
and vacuum phenomena at L5-S1. No acute osseous pathology.
IMPRESSION: 1. No acute intrathoracic pathology.
2. Sigmoid diverticulosis with muscular hypertrophy. Minimal
perisigmoid stranding may be chronic or represent mild or early
diverticulitis. Clinical correlation is recommended. No diverticular
abscess or perforation. No bowel obstruction. Normal appendix.
3. Aortic Atherosclerosis ([id number]-SM8.8).

## 2020-11-09 ENCOUNTER — Other Ambulatory Visit: Payer: Self-pay | Admitting: Cardiovascular Disease

## 2020-11-09 DIAGNOSIS — I5022 Chronic systolic (congestive) heart failure: Secondary | ICD-10-CM

## 2020-11-14 ENCOUNTER — Other Ambulatory Visit: Payer: Self-pay

## 2020-11-14 ENCOUNTER — Telehealth: Payer: Self-pay | Admitting: Cardiovascular Disease

## 2020-11-14 MED ORDER — POTASSIUM CHLORIDE ER 10 MEQ PO CPCR: 20.0000 meq | ORAL_CAPSULE | Freq: Two times a day (BID) | ORAL | 3 refills | Status: DC

## 2020-11-14 NOTE — Telephone Encounter (Signed)
Pt's order for potassium: Potassium Take 2 tablets (20 mEq total) by mouth 2 (two) times daily.  Was switch to capsule form for pt's preference d/t difficulty swallowing tabs. Pharmacy okay with switching to capsule that is equivalent ro original dosing of tabs.   New order  potassium chloride (MICRO-K) 10 MEQ CR capsule 20 mEq, 2 times daily

## 2020-11-14 NOTE — Telephone Encounter (Signed)
Patient is requesting Potassium Chloride capsules the tablet form are to hard for him to swallow

## 2020-11-14 NOTE — Telephone Encounter (Signed)
He should be able to dissolve Klor-con tablet in water,but the capsules are also available to order in Epic.  Just select potassium chloride capsules and order same amount of daily mEq

## 2020-11-14 NOTE — Telephone Encounter (Signed)
New script sent in to pt's pharmacy   potassium chloride (MICRO-K) 10 MEQ CR capsule 20 mEq, 2 times daily

## 2020-11-24 NOTE — Progress Notes (Signed)
Patient ID: Bryce [name], male    DOB: Oct 20, 1965, 55 y.o.   MRN: [id number]   Bryce [name] is a 55 y/o male with a history of CAD (MI), HTN, GERD, tobacco use and chronic heart failure.   Echo report from 04/26/2019 reviewed and showed an EF of 45-50%. Echo report from 02/03/17 reviewed and showed an EF of 45-50% along with mild inferior hypokinesis.   LHC completed 03/02/20 and showed: Non-stenotic Mid LAD lesion was previously treated. Mid RCA lesion is 40% stenosed. Previously placed Dist Cx-1 drug eluting stent is widely patent. Balloon angioplasty was performed. Dist Cx-2 lesion is 40% stenosed. The left ventricular ejection fraction is 45-50% by visual estimate. LV end diastolic pressure is normal. There is mild left ventricular systolic dysfunction. There is no mitral valve regurgitation.  Catheterization done 02/02/17 which showed: Mid LAD lesion, 0 %stenosed. Mid RCA lesion, 40 %stenosed. A STENT XIENCE ALPINE RX 3.0X23 drug eluting stent was successfully placed, and does not overlap previously placed stent. Dist Cx lesion, 100 %stenosed. Post intervention, there is a 0% residual stenosis. There is mild left ventricular systolic dysfunction. LV end diastolic pressure is mildly elevated. The left ventricular ejection fraction is 50-55% by visual estimate. Conclusion Successful PCI and stent to mid to distal circumflex with DES stent 3.023 mm xience Reducing the lesion from 100% down to 0 point TIMI-3 from 0 back to 3.  Has not been admitted or been in the ED in the last 6 months.   He presents today for a follow-up visit with a chief complaint of moderate fatigue upon minimal exertion. He describes this as chronic in nature having been present for several years. He has associated shortness of breath, headaches, light-headedness and gradual weight gain along with this. He denies any difficulty sleeping, abdominal distention, palpitations, pedal edema, chest pain or cough.    Takes his diuretic PRN based on weight and swelling and says that ~ 2 weeks ago, he took it for 3 days in a row. Not weighing daily but says that he weighes himself ~ 4 times/ week.               Past Medical History:  Diagnosis Date   ASD (atrial septal defect)    a. 04/2019 Echo: small ASD w/ predominantly L->R shunting.   Coronary artery disease    a. 11/2010 NSTEMI/PCI: BMS -->LAD; b. 02/2017 Lateral STEMI/PCI: LM nl, LAD patent stent, LCX 100 (3.0x23 Xience Alpine DES), RCA 22m EF 50%; c. 11/2018 MV: Fixed apical and periapical defect (? over-processing). No significant ischemia. EF 48%.   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 04/2019 Echo: EF 45-50%, mild LVH. Mildly dil LA. Small ASA w/ predominantly L->R shunting.   Hyperlipidemia LDL goal <70    Hypertension    Ischemic cardiomyopathy    a. 04/2019 Echo: EF 45-50%.   LBBB (left bundle branch block)    [place]    Migraine    Past Surgical History:  Procedure Laterality Date   CARDIAC CATHETERIZATION N/A 12/28/2014   Procedure: Left Heart Cath and Coronary Angiography;  Surgeon: DYolonda Kida MD;  [facility];  Service: Cardiovascular;  Laterality: N/A;   CORONARY ANGIOPLASTY     Non-ST elevation MI status post stenting within bare-metal stent of the 75% lesion of the LAD, 11/13/2010.    CORONARY STENT INTERVENTION N/A 02/02/2017   Procedure:  CORONARY/GRAFT ACUTE MI REVASCULARIZATION;  Surgeon: Yolonda Kida, MD;  Location: [facility];  Service: Cardiovascular;  Laterality: N/A;   LEFT HEART CATH AND CORONARY ANGIOGRAPHY N/A 02/02/2017   Procedure: LEFT HEART CATH AND CORONARY ANGIOGRAPHY;  Surgeon: Yolonda Kida, MD;  Location: [facility];  Service: Cardiovascular;  Laterality: N/A;   LEFT HEART CATH AND CORONARY ANGIOGRAPHY N/A 03/02/2020   Procedure: LEFT HEART CATH AND CORONARY  ANGIOGRAPHY;  Surgeon: Minna Merritts, MD;  Location: [place];  Service: Cardiovascular;  Laterality: N/A;   Family History  Problem Relation Age of Onset   CAD Father    Kidney disease Neg Hx    Diabetes Mellitus II Neg Hx    Social History   Tobacco Use   Smoking status: Some Days    Years: 5.00    Pack years: 0.00    Types: Cigarettes    Last attempt to quit: 01/05/2017    Years since quitting: 3.8   Smokeless tobacco: Never   Tobacco comments:    Typically 1 pack a week but recently sometimes 2 packs 10/18/19  Substance Use Topics   Alcohol use: Not Currently    Alcohol/week: 1.0 standard drink    Types: 1 Cans of beer per week    Comment: Social Drinker   Allergies  Allergen Reactions   Atorvastatin     Myalgias   Prior to Admission medications   Medication Sig Start Date End Date Taking? Authorizing Provider  aspirin EC 81 MG EC tablet Take 1 tablet (81 mg total) by mouth daily. 02/05/17  Yes Epifanio Lesches, MD  aspirin-acetaminophen-caffeine (EXCEDRIN MIGRAINE) [phone] MG tablet Take 1 tablet by mouth daily as needed for headache. Patient taking differently: Take 2 tablets by mouth daily as needed for headache. 01/06/19  Yes Iloabachie, Chioma E, NP  clopidogrel (PLAVIX) 75 MG tablet Take 1 tablet (75 mg total) by mouth daily. 07/10/20  Yes Gollan, Kathlene November, MD  Docosanol (ABREVA) 10 % CREA Apply 1 application topically 2 (two) times daily. Patient taking differently: Apply 1 application topically 2 (two) times daily as needed. 01/15/18  Yes Dustin Flock, MD  DULoxetine (CYMBALTA) 30 MG capsule Take 1 capsule (30 mg total) by mouth 2 (two) times daily. 04/11/20  Yes Iloabachie, Chioma E, NP  ezetimibe (ZETIA) 10 MG tablet Take 1 tablet (10 mg total) by mouth daily. 05/24/20  Yes Gollan, Kathlene November, MD  gabapentin (NEURONTIN) 100 MG capsule TAKE ONE CAPSULE BY MOUTH 3 TIMES A DAY 02/22/20  Yes Iloabachie, Chioma E, NP  lisinopril (ZESTRIL) 20 MG tablet  Take 1 tablet (20 mg total) by mouth daily. 07/06/20  Yes Darylene Price A, FNP  metFORMIN (GLUCOPHAGE) 1000 MG tablet Take 0.5 tablets (500 mg total) by mouth daily with breakfast. 03/28/20  Yes Iloabachie, Chioma E, NP  metoprolol tartrate (LOPRESSOR) 25 MG tablet TAKE ONE TABLET BY MOUTH 2 TIMES A DAY 07/06/20  Yes Darylene Price A, FNP  nitroGLYCERIN (NITROSTAT) 0.4 MG SL tablet Place 1 tablet (0.4 mg total) under the tongue every 5 (five) minutes x 3 doses as needed for chest pain. 04/06/19  Yes Dunn, Ryan M, PA-C  pantoprazole (PROTONIX) 40 MG tablet TAKE ONE TABLET BY MOUTH EVERY DAY 10/28/19  Yes Iloabachie, Chioma E, NP  potassium chloride (MICRO-K) 10 MEQ CR capsule Take 2 capsules (20 mEq total) by mouth 2 (two) times daily. 11/14/20  Yes Minna Merritts, MD  rosuvastatin (CRESTOR) 40 MG tablet  Take 1 tablet (40 mg total) by mouth daily. 07/03/20  Yes Gollan, Kathlene November, MD  terbinafine (ATHLETES FOOT AF) 1 % cream Apply 1 application topically 2 (two) times daily. Patient taking differently: Apply 1 application topically 2 (two) times daily as needed. 05/26/19  Yes Merlyn Lot, MD  torsemide (DEMADEX) 20 MG tablet Take 20 mg by mouth 2 (two) times daily as needed.   Yes [provider]  Blood Pressure KIT 1 kit by Does not apply route daily. 04/01/19   Iloabachie, Chioma E, NP  Docosanol 10 % CREA Apply topically as needed.    [provider]    Review of Systems  Constitutional:  Positive for fatigue (tire easily). Negative for appetite change.  HENT:  Negative for congestion, postnasal drip and sore throat.   Eyes: Negative.   Respiratory:  Positive for shortness of breath (minimal). Negative for cough.   Cardiovascular:  Negative for chest pain, palpitations and leg swelling.  Gastrointestinal:  Negative for abdominal distention and abdominal pain.  Endocrine: Negative.   Genitourinary: Negative.   Musculoskeletal:  Negative for back pain and neck pain.  Skin:  Negative.   Allergic/Immunologic: Negative.   Neurological:  Positive for light-headedness (with position changes) and headaches. Negative for dizziness.  Hematological:  Negative for adenopathy. Does not bruise/bleed easily.  Psychiatric/Behavioral:  Negative for dysphoric mood and sleep disturbance (sleeping on 2 pillows). The patient is not nervous/anxious.    Vitals:   11/27/20 0901  BP: 108/68  Pulse: (!) 52  Resp: 18  SpO2: 98%  Weight: 265 lb 4 oz (120.3 kg)  Height: 5' 8" (1.727 m)   Wt Readings from Last 3 Encounters:  11/27/20 265 lb 4 oz (120.3 kg)  05/31/20 254 lb 6 oz (115.4 kg)  03/28/20 256 lb 4.8 oz (116.3 kg)   Lab Results  Component Value Date   CREATININE 1.06 03/08/2020   CREATININE 1.11 03/02/2020   CREATININE 0.94 02/29/2020    Physical Exam Vitals and nursing note reviewed.  Constitutional:      Appearance: Normal appearance.  HENT:     Head: Normocephalic and atraumatic.  Cardiovascular:     Rate and Rhythm: Regular rhythm. Bradycardia present.  Pulmonary:     Effort: Pulmonary effort is normal. No respiratory distress.     Breath sounds: No wheezing or rales.  Abdominal:     General: There is no distension.     Palpations: Abdomen is soft.  Musculoskeletal:        General: No tenderness.     Cervical back: Normal range of motion and neck supple.     Right lower leg: No tenderness. No edema.     Left lower leg: No tenderness. No edema.  Skin:    General: Skin is warm and dry.  Neurological:     General: No focal deficit present.     Mental Status: He is alert and oriented to person, place, and time.  Psychiatric:        Mood and Affect: Mood normal.        Behavior: Behavior normal.        Thought Content: Thought content normal.     Assessment & Plan:  1: Chronic heart failure with mildly reduced ejection fraction- - NYHA class III - euvolemic today - not weighing daily but does have scales; says that he weighs himself ~4 times/  week; instructed to resume weighing daily and to call for an overnight weight gain of >2 pounds  or a weekly weight gain of >5 pounds - weight up 11 pounds from last visit here 6 months ago - saw cardiology Bryce [name]) 03/14/20 - says that he's trying to increase his activity by walking daily - on GDMT of lisinopril & metoprolol - BP may not allow for transition to entresto - consider adding spironolactone/ SGLT2 - BNP 02/29/20 was 20.2  2: HTN- - BP looks good although on the low side - saw PCP at [facility] ~ 2 weeks ago - BMP 03/08/2020 reviewed and showed sodium 141, potassium 4.3, creatinine 1.06 and GFR 79 - check BMP at next visit if not done by PCP  3. Tobacco and Alcohol use - hasn't smoked since September 2021 - denies alcohol use - continued cessation encouraged   Patient did not bring his medications nor a list. Each medication was verbally reviewed with the patient and he was encouraged to bring the bottles to every visit to confirm accuracy of list.  Return in 6 months or sooner for any questions/problems before then.

## 2020-11-27 ENCOUNTER — Other Ambulatory Visit: Payer: Self-pay

## 2020-11-27 ENCOUNTER — Ambulatory Visit: Payer: Medicaid Other | Attending: Family | Admitting: Family

## 2020-11-27 ENCOUNTER — Encounter: Payer: Self-pay | Admitting: Family

## 2020-11-27 VITALS — BP 108/68 | HR 52 | Resp 18 | Ht 68.0 in | Wt 265.2 lb

## 2020-11-27 DIAGNOSIS — Z79899 Other long term (current) drug therapy: Secondary | ICD-10-CM | POA: Diagnosis not present

## 2020-11-27 DIAGNOSIS — I10 Essential (primary) hypertension: Secondary | ICD-10-CM

## 2020-11-27 DIAGNOSIS — Z7984 Long term (current) use of oral hypoglycemic drugs: Secondary | ICD-10-CM | POA: Diagnosis not present

## 2020-11-27 DIAGNOSIS — I2510 Atherosclerotic heart disease of native coronary artery without angina pectoris: Secondary | ICD-10-CM | POA: Diagnosis not present

## 2020-11-27 DIAGNOSIS — Z8249 Family history of ischemic heart disease and other diseases of the circulatory system: Secondary | ICD-10-CM | POA: Insufficient documentation

## 2020-11-27 DIAGNOSIS — I252 Old myocardial infarction: Secondary | ICD-10-CM | POA: Insufficient documentation

## 2020-11-27 DIAGNOSIS — F17210 Nicotine dependence, cigarettes, uncomplicated: Secondary | ICD-10-CM | POA: Diagnosis not present

## 2020-11-27 DIAGNOSIS — Z7982 Long term (current) use of aspirin: Secondary | ICD-10-CM | POA: Diagnosis not present

## 2020-11-27 DIAGNOSIS — I509 Heart failure, unspecified: Secondary | ICD-10-CM | POA: Diagnosis present

## 2020-11-27 DIAGNOSIS — I110 Hypertensive heart disease with heart failure: Secondary | ICD-10-CM | POA: Insufficient documentation

## 2020-11-27 DIAGNOSIS — K219 Gastro-esophageal reflux disease without esophagitis: Secondary | ICD-10-CM | POA: Insufficient documentation

## 2020-11-27 DIAGNOSIS — Z7902 Long term (current) use of antithrombotics/antiplatelets: Secondary | ICD-10-CM | POA: Diagnosis not present

## 2020-11-27 DIAGNOSIS — Z7901 Long term (current) use of anticoagulants: Secondary | ICD-10-CM | POA: Diagnosis not present

## 2020-11-27 DIAGNOSIS — I5022 Chronic systolic (congestive) heart failure: Secondary | ICD-10-CM

## 2020-11-27 DIAGNOSIS — Z720 Tobacco use: Secondary | ICD-10-CM

## 2020-11-27 NOTE — Patient Instructions (Addendum)
Resume weighing daily and call for an overnight weight gain of > 2 pounds or a weekly weight gain of >5 pounds.   Bring ALL medications including any over the counter or vitamins to every visit.   

## 2021-05-21 NOTE — Progress Notes (Deleted)
Patient ID: Bryce [name], male    DOB: Mar 13, 1966, 55 y.o.   MRN: [id number]   Mr Graziani is a 55 y/o male with a history of CAD (MI), HTN, GERD, tobacco use and chronic heart failure.   Echo report from 04/26/2019 reviewed and showed an EF of 45-50%. Echo report from 02/03/17 reviewed and showed an EF of 45-50% along with mild inferior hypokinesis.   LHC completed 03/02/20 and showed: Non-stenotic Mid LAD lesion was previously treated. Mid RCA lesion is 40% stenosed. Previously placed Dist Cx-1 drug eluting stent is widely patent. Balloon angioplasty was performed. Dist Cx-2 lesion is 40% stenosed. The left ventricular ejection fraction is 45-50% by visual estimate. LV end diastolic pressure is normal. There is mild left ventricular systolic dysfunction. There is no mitral valve regurgitation.  Catheterization done 02/02/17 which showed: Mid LAD lesion, 0 %stenosed. Mid RCA lesion, 40 %stenosed. A STENT XIENCE ALPINE RX 3.0X23 drug eluting stent was successfully placed, and does not overlap previously placed stent. Dist Cx lesion, 100 %stenosed. Post intervention, there is a 0% residual stenosis. There is mild left ventricular systolic dysfunction. LV end diastolic pressure is mildly elevated. The left ventricular ejection fraction is 50-55% by visual estimate. Conclusion Successful PCI and stent to mid to distal circumflex with DES stent 3.023 mm xience Reducing the lesion from 100% down to 0 point TIMI-3 from 0 back to 3.  Has not been admitted or been in the ED in the last 6 months.   He presents today for a follow-up visit with a chief complaint of             Past Medical History:  Diagnosis Date   ASD (atrial septal defect)    a. 04/2019 Echo: small ASD w/ predominantly L->R shunting.   Coronary artery disease    a. 11/2010 NSTEMI/PCI: BMS -->LAD; b. 02/2017 Lateral STEMI/PCI: LM nl, LAD patent stent, LCX 100 (3.0x23 Xience Alpine DES), RCA 45m. EF 50%; c. 11/2018 MV:  Fixed apical and periapical defect (? over-processing). No significant ischemia. EF 48%.   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 04/2019 Echo: EF 45-50%, mild LVH. Mildly dil LA. Small ASA w/ predominantly L->R shunting.   Hyperlipidemia LDL goal <70    Hypertension    Ischemic cardiomyopathy    a. 04/2019 Echo: EF 45-50%.   LBBB (left bundle branch block)    MI (myocardial infarction) ([facility])    Migraine    Past Surgical History:  Procedure Laterality Date   CARDIAC CATHETERIZATION N/A 12/28/2014   Procedure: Left Heart Cath and Coronary Angiography;  Surgeon: Alwyn Pea, MD;  Location: [facility];  Service: Cardiovascular;  Laterality: N/A;   CORONARY ANGIOPLASTY     Non-ST elevation MI status post stenting within bare-metal stent of the 75% lesion of the LAD, 11/13/2010.    CORONARY STENT INTERVENTION N/A 02/02/2017   Procedure: CORONARY/GRAFT ACUTE MI REVASCULARIZATION;  Surgeon: Alwyn Pea, MD;  Location: [facility];  Service: Cardiovascular;  Laterality: N/A;   LEFT HEART CATH AND CORONARY ANGIOGRAPHY N/A 02/02/2017   Procedure: LEFT HEART CATH AND CORONARY ANGIOGRAPHY;  Surgeon: Alwyn Pea, MD;  Location: [facility];  Service: Cardiovascular;  Laterality: N/A;   LEFT HEART CATH AND CORONARY ANGIOGRAPHY N/A 03/02/2020   Procedure: LEFT HEART CATH AND CORONARY ANGIOGRAPHY;  Surgeon: Antonieta Iba, MD;  Location: [facility];  Service: Cardiovascular;  Laterality: N/A;   Family History  Problem Relation Age of Onset   CAD Father    Kidney disease Neg Hx    Diabetes Mellitus II Neg Hx    Social History   Tobacco Use   Smoking status: Some Days    Years: 5.00    Types: Cigarettes    Last attempt to quit: 01/05/2017    Years since quitting: 4.3   Smokeless tobacco: Never   Tobacco comments:    Typically 1 pack a week but recently sometimes 2 packs  10/18/19  Substance Use Topics   Alcohol use: Not Currently    Alcohol/week: 1.0 standard drink    Types: 1 Cans of beer per week    Comment: Social Drinker   Allergies  Allergen Reactions   Atorvastatin     Myalgias     Review of Systems  Constitutional:  Positive for fatigue (tire easily). Negative for appetite change.  HENT:  Negative for congestion, postnasal drip and sore throat.   Eyes: Negative.   Respiratory:  Positive for shortness of breath (minimal). Negative for cough.   Cardiovascular:  Negative for chest pain, palpitations and leg swelling.  Gastrointestinal:  Negative for abdominal distention and abdominal pain.  Endocrine: Negative.   Genitourinary: Negative.   Musculoskeletal:  Negative for back pain and neck pain.  Skin: Negative.   Allergic/Immunologic: Negative.   Neurological:  Positive for light-headedness (with position changes) and headaches. Negative for dizziness.  Hematological:  Negative for adenopathy. Does not bruise/bleed easily.  Psychiatric/Behavioral:  Negative for dysphoric mood and sleep disturbance (sleeping on 2 pillows). The patient is not nervous/anxious.       Physical Exam Vitals and nursing note reviewed.  Constitutional:      Appearance: Normal appearance.  HENT:     Head: Normocephalic and atraumatic.  Cardiovascular:     Rate and Rhythm: Regular rhythm. Bradycardia present.  Pulmonary:     Effort: Pulmonary effort is normal. No respiratory distress.     Breath sounds: No wheezing or rales.  Abdominal:     General: There is no distension.     Palpations: Abdomen is soft.  Musculoskeletal:        General: No tenderness.     Cervical back: Normal range of motion and neck supple.     Right lower leg: No tenderness. No edema.     Left lower leg: No tenderness. No edema.  Skin:    General: Skin is warm and dry.  Neurological:     General: No focal deficit present.     Mental Status: He is alert and oriented to person,  place, and time.  Psychiatric:        Mood and Affect: Mood normal.        Behavior: Behavior normal.        Thought Content: Thought content normal.     Assessment & Plan:  1: Chronic heart failure with mildly reduced ejection fraction- - NYHA class III - euvolemic today - not weighing daily but does have scales; says that he weighs himself ~4 times/ week; instructed to resume weighing daily and to call for an overnight weight gain of >2 pounds or a weekly weight gain of >5 pounds - weight 265.4 pounds from last visit here 6 months ago - saw cardiology Mariah Milling) 03/14/20 - says that he's trying to increase his activity by walking daily - on GDMT of lisinopril & metoprolol - BP may not allow  for transition to entresto - consider adding spironolactone/ SGLT2 - BNP 02/29/20 was 20.2  2: HTN- - BP  - saw PCP at [facility] ~ - BMP 03/08/2020 reviewed and showed sodium 141, potassium 4.3, creatinine 1.06 and GFR 79  3. Tobacco and Alcohol use - hasn't smoked since September 2021 - denies alcohol use - continued cessation encouraged   Patient did not bring his medications nor a list. Each medication was verbally reviewed with the patient and he was encouraged to bring the bottles to every visit to confirm accuracy of list.

## 2021-05-22 ENCOUNTER — Ambulatory Visit: Payer: Disability Insurance | Admitting: Family

## 2021-06-05 ENCOUNTER — Other Ambulatory Visit: Payer: Self-pay

## 2021-06-05 ENCOUNTER — Ambulatory Visit: Payer: Medicaid Other | Attending: Family | Admitting: Family

## 2021-06-05 ENCOUNTER — Encounter: Payer: Self-pay | Admitting: Family

## 2021-06-05 VITALS — BP 128/70 | HR 62 | Resp 18 | Ht 68.0 in | Wt 253.1 lb

## 2021-06-05 DIAGNOSIS — Z720 Tobacco use: Secondary | ICD-10-CM | POA: Diagnosis not present

## 2021-06-05 DIAGNOSIS — I252 Old myocardial infarction: Secondary | ICD-10-CM | POA: Insufficient documentation

## 2021-06-05 DIAGNOSIS — I110 Hypertensive heart disease with heart failure: Secondary | ICD-10-CM | POA: Diagnosis not present

## 2021-06-05 DIAGNOSIS — I2510 Atherosclerotic heart disease of native coronary artery without angina pectoris: Secondary | ICD-10-CM | POA: Insufficient documentation

## 2021-06-05 DIAGNOSIS — Z955 Presence of coronary angioplasty implant and graft: Secondary | ICD-10-CM | POA: Insufficient documentation

## 2021-06-05 DIAGNOSIS — Z87891 Personal history of nicotine dependence: Secondary | ICD-10-CM | POA: Diagnosis not present

## 2021-06-05 DIAGNOSIS — I10 Essential (primary) hypertension: Secondary | ICD-10-CM | POA: Diagnosis not present

## 2021-06-05 DIAGNOSIS — I5020 Unspecified systolic (congestive) heart failure: Secondary | ICD-10-CM | POA: Diagnosis present

## 2021-06-05 DIAGNOSIS — K219 Gastro-esophageal reflux disease without esophagitis: Secondary | ICD-10-CM | POA: Insufficient documentation

## 2021-06-05 DIAGNOSIS — I5022 Chronic systolic (congestive) heart failure: Secondary | ICD-10-CM | POA: Diagnosis not present

## 2021-06-05 MED ORDER — POTASSIUM CHLORIDE ER 10 MEQ PO CPCR: 20.0000 meq | ORAL_CAPSULE | Freq: Two times a day (BID) | ORAL | 3 refills | Status: AC

## 2021-06-05 NOTE — Patient Instructions (Signed)
Continue weighing daily and call for an overnight weight gain of 3 pounds or more or a weekly weight gain of more than 5 pounds.  °

## 2021-06-05 NOTE — Progress Notes (Signed)
Patient ID: Bryce [name], male    DOB: 08-29-1965, 56 y.o.   MRN: [id number]   Mr Spinella is a 56 y/o male with a history of CAD (MI), HTN, GERD, tobacco use and chronic heart failure.   Echo report from 04/26/2019 reviewed and showed an EF of 45-50%. Echo report from 02/03/17 reviewed and showed an EF of 45-50% along with mild inferior hypokinesis.   LHC completed 03/02/20 and showed: Non-stenotic Mid LAD lesion was previously treated. Mid RCA lesion is 40% stenosed. Previously placed Dist Cx-1 drug eluting stent is widely patent. Balloon angioplasty was performed. Dist Cx-2 lesion is 40% stenosed. The left ventricular ejection fraction is 45-50% by visual estimate. LV end diastolic pressure is normal. There is mild left ventricular systolic dysfunction. There is no mitral valve regurgitation.  Catheterization done 02/02/17 which showed: Mid LAD lesion, 0 %stenosed. Mid RCA lesion, 40 %stenosed. A STENT XIENCE ALPINE RX 3.0X23 drug eluting stent was successfully placed, and does not overlap previously placed stent. Dist Cx lesion, 100 %stenosed. Post intervention, there is a 0% residual stenosis. There is mild left ventricular systolic dysfunction. LV end diastolic pressure is mildly elevated. The left ventricular ejection fraction is 50-55% by visual estimate. Conclusion Successful PCI and stent to mid to distal circumflex with DES stent 3.023 mm xience Reducing the lesion from 100% down to 0 point TIMI-3 from 0 back to 3.  Has not been admitted or been in the ED in the last 6 months.   He presents today for a follow-up visit with a chief complaint of minimal shortness of breath upon moderate exertion. He describes this as chronic in nature having been present for several years. He has associated fatigue, occasional light-headedness, achiness in hands and weight loss along with this. He denies any difficulty sleeping, abdominal distention, palpitations, pedal edema, chest pain,  cough or weight gain.   Now living in a boarding house in [place] where he has his own bathroom. Says that it's a nice place and better then living in his car. Going to the gym 6-7 days/ week and overall is feeling physically better than last time he was here. Taking torsemide as needed but taking potassium daily due to cramping.         Past Medical History:  Diagnosis Date   ASD (atrial septal defect)    a. 04/2019 Echo: small ASD w/ predominantly L->R shunting.   Coronary artery disease    a. 11/2010 NSTEMI/PCI: BMS -->LAD; b. 02/2017 Lateral STEMI/PCI: LM nl, LAD patent stent, LCX 100 (3.0x23 Xience Alpine DES), RCA 87m EF 50%; c. 11/2018 MV: Fixed apical and periapical defect (? over-processing). No significant ischemia. EF 48%.   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 04/2019 Echo: EF 45-50%, mild LVH. Mildly dil LA. Small ASA w/ predominantly L->R shunting.   Hyperlipidemia LDL goal <70    Hypertension    Ischemic cardiomyopathy    a. 04/2019 Echo: EF 45-50%.   LBBB (left bundle branch block)    MI (myocardial infarction) ([facility]    Migraine    Past Surgical History:  Procedure Laterality Date   CARDIAC CATHETERIZATION N/A 12/28/2014   Procedure: Left Heart Cath and Coronary Angiography;  Surgeon: DYolonda Kida MD;  [facility];  Service: Cardiovascular;  Laterality: N/A;   CORONARY ANGIOPLASTY     Non-ST elevation MI status post stenting within bare-metal stent of the  75% lesion of the LAD, 11/13/2010.    CORONARY STENT INTERVENTION N/A 02/02/2017   Procedure: CORONARY/GRAFT ACUTE MI REVASCULARIZATION;  Surgeon: Yolonda Kida, MD;  Location: [facility];  Service: Cardiovascular;  Laterality: N/A;   LEFT HEART CATH AND CORONARY ANGIOGRAPHY N/A 02/02/2017   Procedure: LEFT HEART CATH AND CORONARY ANGIOGRAPHY;  Surgeon: Yolonda Kida, MD;  Location: [facility];   Service: Cardiovascular;  Laterality: N/A;   LEFT HEART CATH AND CORONARY ANGIOGRAPHY N/A 03/02/2020   Procedure: LEFT HEART CATH AND CORONARY ANGIOGRAPHY;  Surgeon: Minna Merritts, MD;  Location: [facility];  Service: Cardiovascular;  Laterality: N/A;   Family History  Problem Relation Age of Onset   CAD Father    Kidney disease Neg Hx    Diabetes Mellitus II Neg Hx    Social History   Tobacco Use   Smoking status: Some Days    Years: 5.00    Types: Cigarettes    Last attempt to quit: 01/05/2017    Years since quitting: 4.4   Smokeless tobacco: Never   Tobacco comments:    Typically 1 pack a week but recently sometimes 2 packs 10/18/19  Substance Use Topics   Alcohol use: Not Currently    Alcohol/week: 1.0 standard drink    Types: 1 Cans of beer per week    Comment: Social Drinker   Allergies  Allergen Reactions   Atorvastatin     Myalgias   Prior to Admission medications   Medication Sig Start Date End Date Taking? Authorizing Provider  aspirin EC 81 MG EC tablet Take 1 tablet (81 mg total) by mouth daily. 02/05/17  Yes Epifanio Lesches, MD  aspirin-acetaminophen-caffeine (EXCEDRIN MIGRAINE) [phone] MG tablet Take 1 tablet by mouth daily as needed for headache. Patient taking differently: Take 2 tablets by mouth daily as needed for headache. 01/06/19  Yes Iloabachie, Chioma E, NP  Blood Pressure KIT 1 kit by Does not apply route daily. 04/01/19  Yes Iloabachie, Chioma E, NP  clopidogrel (PLAVIX) 75 MG tablet Take 1 tablet (75 mg total) by mouth daily. 07/10/20  Yes Minna Merritts, MD  DULoxetine (CYMBALTA) 30 MG capsule Take 1 capsule (30 mg total) by mouth 2 (two) times daily. 04/11/20  Yes Iloabachie, Chioma E, NP  ezetimibe (ZETIA) 10 MG tablet Take 1 tablet (10 mg total) by mouth daily. 05/24/20  Yes Gollan, Kathlene November, MD  gabapentin (NEURONTIN) 100 MG capsule TAKE ONE CAPSULE BY MOUTH 3 TIMES A DAY 02/22/20  Yes Iloabachie, Chioma E, NP  lisinopril  (ZESTRIL) 20 MG tablet Take 1 tablet (20 mg total) by mouth daily. 07/06/20  Yes Ronney Honeywell A, FNP  meloxicam (MOBIC) 15 MG tablet Take 15 mg by mouth daily as needed for pain. Take one tablet daily as needed for pain   Yes [provider]  metFORMIN (GLUCOPHAGE) 1000 MG tablet Take 0.5 tablets (500 mg total) by mouth daily with breakfast. 03/28/20  Yes Iloabachie, Chioma E, NP  metoprolol tartrate (LOPRESSOR) 25 MG tablet TAKE ONE TABLET BY MOUTH 2 TIMES A DAY 07/06/20  Yes Gavan Nordby A, FNP  nitroGLYCERIN (NITROSTAT) 0.4 MG SL tablet Place 1 tablet (0.4 mg total) under the tongue every 5 (five) minutes x 3 doses as needed for chest pain. 04/06/19  Yes Dunn, Ryan M, PA-C  pantoprazole (PROTONIX) 40 MG tablet TAKE ONE TABLET BY MOUTH EVERY DAY 10/28/19  Yes Iloabachie, Chioma E, NP  potassium chloride (MICRO-K) 10 MEQ  CR capsule Take 2 capsules (20 mEq total) by mouth 2 (two) times daily. 06/05/21  Yes Darylene Price A, FNP  rosuvastatin (CRESTOR) 40 MG tablet Take 1 tablet (40 mg total) by mouth daily. 07/03/20  Yes Minna Merritts, MD  torsemide (DEMADEX) 20 MG tablet Take 20 mg by mouth 2 (two) times daily as needed.   Yes [provider]   Review of Systems  Constitutional:  Positive for fatigue. Negative for appetite change.  HENT:  Negative for congestion, postnasal drip and sore throat.   Eyes: Negative.   Respiratory:  Positive for shortness of breath (minimal). Negative for cough.   Cardiovascular:  Negative for chest pain, palpitations and leg swelling.  Gastrointestinal:  Negative for abdominal distention and abdominal pain.  Endocrine: Negative.   Genitourinary: Negative.   Musculoskeletal:  Positive for arthralgias (hands aching often). Negative for back pain and neck pain.  Skin: Negative.   Allergic/Immunologic: Negative.   Neurological:  Positive for light-headedness (occasional). Negative for dizziness and headaches.  Hematological:  Negative for adenopathy.  Does not bruise/bleed easily.  Psychiatric/Behavioral:  Negative for dysphoric mood and sleep disturbance (sleeping on 2 pillows). The patient is not nervous/anxious.    Vitals:   06/05/21 1120  BP: 128/70  Pulse: 62  Resp: 18  SpO2: 96%  Weight: 253 lb 2 oz (114.8 kg)  Height: '5\' 8"'  (1.727 m)   Wt Readings from Last 3 Encounters:  06/05/21 253 lb 2 oz (114.8 kg)  11/27/20 265 lb 4 oz (120.3 kg)  05/31/20 254 lb 6 oz (115.4 kg)   Lab Results  Component Value Date   CREATININE 1.06 03/08/2020   CREATININE 1.11 03/02/2020   CREATININE 0.94 02/29/2020    Physical Exam Vitals and nursing note reviewed.  Constitutional:      Appearance: Normal appearance.  HENT:     Head: Normocephalic and atraumatic.  Cardiovascular:     Rate and Rhythm: Normal rate and regular rhythm.  Pulmonary:     Effort: Pulmonary effort is normal. No respiratory distress.     Breath sounds: No wheezing or rales.  Abdominal:     General: There is no distension.     Palpations: Abdomen is soft.  Musculoskeletal:        General: No tenderness.     Cervical back: Normal range of motion and neck supple.     Right lower leg: No tenderness. No edema.     Left lower leg: No tenderness. No edema.  Skin:    General: Skin is warm and dry.  Neurological:     General: No focal deficit present.     Mental Status: He is alert and oriented to person, place, and time.  Psychiatric:        Mood and Affect: Mood normal.        Behavior: Behavior normal.        Thought Content: Thought content normal.     Assessment & Plan:  1: Chronic heart failure with mildly reduced ejection fraction- - NYHA class II - euvolemic today - weighing daily and says his weight ranges from 250-254 pounds; reminded to call for an overnight weight gain of >2 pounds or a weekly weight gain of >5 pounds - weight down 12 pounds from last visit here 6 months ago - going to [employer] daily and working out for ~ 1 hour each day -  saw cardiology Rockey Situ) 03/14/20 - says that he's trying to increase his activity by walking  daily - on GDMT of lisinopril & metoprolol - BP may not allow for transition to entresto - consider adding spironolactone/ SGLT2 - last echo done in 2020; will re-do this and adjust meds if needed based on those results - BNP 02/29/20 was 20.2 - PharmD reconciled medications with the patient  2: HTN- - BP looks good (128/70) - saw PCP at [facility] ~ - BMP 03/08/2020 reviewed and showed sodium 141, potassium 4.3, creatinine 1.06 and GFR 79 - will do labs next visit  3. Tobacco and Alcohol use - hasn't smoked since September 2021 - denies alcohol use - continued cessation encouraged   Medication bottles reviewed.   Return in 1 month or sooner for any questions/problems before then.

## 2021-06-05 NOTE — Progress Notes (Signed)
[facility] - PHARMACIST COUNSELING NOTE  Guideline-Directed Medical Therapy/Evidence Based Medicine  ACE/ARB/ARNI: Lisinopril 20 mg daily Beta Blocker: Metoprolol tartrate 25 mg twice daily Aldosterone Antagonist:  None Diuretic: Torsemide 20 mg daily PRN SGLT2i:  None  Adherence Assessment  Do you ever forget to take your medication? '[]' Yes '[x]' No  Do you ever skip doses due to side effects? '[]' Yes '[x]' No  Do you have trouble affording your medicines? '[]' Yes '[x]' No  Are you ever unable to pick up your medication due to transportation difficulties? '[]' Yes '[x]' No  Do you ever stop taking your medications because you don't believe they are helping? '[]' Yes '[x]' No  Do you check your weight daily? '[x]' Yes '[]' No   Adherence strategy: Pt states to be adherent to taking medications.   Barriers to obtaining medications: None reported by patient.   Vital signs: HR 62, BP 128/70, ECHO: Date 04/2019, EF 45-50  BMP Latest Ref Rng & Units 03/08/2020 03/02/2020 02/29/2020  Glucose 65 - 99 mg/dL 91 99 147(H)  BUN 6 - 24 mg/dL '11 20 13  ' Creatinine 0.76 - 1.27 mg/dL 1.06 1.11 0.94  BUN/Creat Ratio 9 - 20 10 - -  Sodium 134 - 144 mmol/L 141 139 139  Potassium 3.5 - 5.2 mmol/L 4.3 4.1 3.9  Chloride 96 - 106 mmol/L 104 103 106  CO2 20 - 29 mmol/L '24 29 24  ' Calcium 8.7 - 10.2 mg/dL 9.6 8.7(L) 9.1    Past Medical History:  Diagnosis Date   ASD (atrial septal defect)    a. 04/2019 Echo: small ASD w/ predominantly L->R shunting.   Coronary artery disease    a. 11/2010 NSTEMI/PCI: BMS -->LAD; b. 02/2017 Lateral STEMI/PCI: LM nl, LAD patent stent, LCX 100 (3.0x23 Xience Alpine DES), RCA 32m EF 50%; c. 11/2018 MV: Fixed apical and periapical defect (? over-processing). No significant ischemia. EF 48%.   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 04/2019 Echo: EF 45-50%, mild LVH. Mildly dil LA.  Small ASA w/ predominantly L->R shunting.   Hyperlipidemia LDL goal <70    Hypertension    Ischemic cardiomyopathy    a. 04/2019 Echo: EF 45-50%.   LBBB (left bundle branch block)    MI (myocardial infarction) (2020 [facility]    Migraine     ASSESSMENT 56year old male who presents to the HF clinic for follow up. Pt does not weight himself daily, but takes torsemide PRN. Overall, pt is doing well. No complaints during the visit.    PLAN HFmrEF - continue current medications.  - consider adding SGLT2 and MAR in the future.  - recommended to limit salt intake.   Time spent: 25 minutes  KOswald Hillock Pharm.D. Clinical Pharmacist 06/05/2021 12:40 PM    Current Outpatient Medications:    aspirin EC 81 MG EC tablet, Take 1 tablet (81 mg total) by mouth daily., Disp: 30 tablet, Rfl: 0   aspirin-acetaminophen-caffeine (EXCEDRIN MIGRAINE) 250-250-65 MG tablet, Take 1 tablet by mouth daily as needed for headache. (Patient taking differently: Take 2 tablets by mouth daily as needed for headache.), Disp: 30 tablet, Rfl: 0   Blood Pressure KIT, 1 kit by Does not apply route daily., Disp: 1 kit, Rfl: 0   clopidogrel (PLAVIX) 75 MG tablet, Take 1 tablet (75 mg total) by mouth daily., Disp: 90 tablet, Rfl: 3   DULoxetine (CYMBALTA) 30 MG capsule, Take 1 capsule (30 mg total) by mouth  2 (two) times daily., Disp: 60 capsule, Rfl: 0   ezetimibe (ZETIA) 10 MG tablet, Take 1 tablet (10 mg total) by mouth daily., Disp: 90 tablet, Rfl: 3   gabapentin (NEURONTIN) 100 MG capsule, TAKE ONE CAPSULE BY MOUTH 3 TIMES A DAY, Disp: 90 capsule, Rfl: 0   lisinopril (ZESTRIL) 20 MG tablet, Take 1 tablet (20 mg total) by mouth daily., Disp: 90 tablet, Rfl: 3   meloxicam (MOBIC) 15 MG tablet, Take 15 mg by mouth daily as needed for pain. Take one tablet daily as needed for pain, Disp: , Rfl:    metFORMIN (GLUCOPHAGE) 1000 MG tablet, Take 0.5 tablets (500 mg total) by mouth daily with breakfast., Disp: 30 tablet, Rfl: 0    metoprolol tartrate (LOPRESSOR) 25 MG tablet, TAKE ONE TABLET BY MOUTH 2 TIMES A DAY, Disp: 180 tablet, Rfl: 3   nitroGLYCERIN (NITROSTAT) 0.4 MG SL tablet, Place 1 tablet (0.4 mg total) under the tongue every 5 (five) minutes x 3 doses as needed for chest pain., Disp: 25 tablet, Rfl: 1   pantoprazole (PROTONIX) 40 MG tablet, TAKE ONE TABLET BY MOUTH EVERY DAY, Disp: 90 tablet, Rfl: 0   potassium chloride (MICRO-K) 10 MEQ CR capsule, Take 2 capsules (20 mEq total) by mouth 2 (two) times daily., Disp: 360 capsule, Rfl: 3   rosuvastatin (CRESTOR) 40 MG tablet, Take 1 tablet (40 mg total) by mouth daily., Disp: 90 tablet, Rfl: 3   torsemide (DEMADEX) 20 MG tablet, Take 20 mg by mouth 2 (two) times daily as needed., Disp: , Rfl:     DRUGS TO CAUTION IN HEART FAILURE  Drug or Class Mechanism  Analgesics NSAIDs COX-2 inhibitors Glucocorticoids  Sodium and water retention, increased systemic vascular resistance, decreased response to diuretics   Diabetes Medications Metformin Thiazolidinediones Rosiglitazone (Avandia) Pioglitazone (Actos) DPP4 Inhibitors Saxagliptin (Onglyza) Sitagliptin (Januvia)   Lactic acidosis Possible calcium channel blockade   Unknown  Antiarrhythmics Class I  Flecainide Disopyramide Class III Sotalol Other Dronedarone  Negative inotrope, proarrhythmic   Proarrhythmic, beta blockade  Negative inotrope  Antihypertensives Alpha Blockers Doxazosin Calcium Channel Blockers Diltiazem Verapamil Nifedipine Central Alpha Adrenergics Moxonidine Peripheral Vasodilators Minoxidil  Increases renin and aldosterone  Negative inotrope    Possible sympathetic withdrawal  Unknown  Anti-infective Itraconazole Amphotericin B  Negative inotrope Unknown  Hematologic Anagrelide Cilostazol   Possible inhibition of PD IV Inhibition of PD III causing arrhythmias  Neurologic/Psychiatric Stimulants Anti-Seizure  Drugs Carbamazepine Pregabalin Antidepressants Tricyclics Citalopram Parkinsons Bromocriptine Pergolide Pramipexole Antipsychotics Clozapine Antimigraine Ergotamine Methysergide Appetite suppressants Bipolar Lithium  Peripheral alpha and beta agonist activity  Negative inotrope and chronotrope Calcium channel blockade  Negative inotrope, proarrhythmic Dose-dependent QT prolongation  Excessive serotonin activity/valvular damage Excessive serotonin activity/valvular damage Unknown  IgE mediated hypersensitivy, calcium channel blockade  Excessive serotonin activity/valvular damage Excessive serotonin activity/valvular damage Valvular damage  Direct myofibrillar degeneration, adrenergic stimulation  Antimalarials Chloroquine Hydroxychloroquine Intracellular inhibition of lysosomal enzymes  Urologic Agents Alpha Blockers Doxazosin Prazosin Tamsulosin Terazosin  Increased renin and aldosterone  Adapted from Page Carleene Overlie, et al. Drugs That May Cause or Exacerbate Heart Failure: A Scientific Statement from the American Heart  Association. Circulation 2016; 134:e32-e69. DOI: 10.1161/CIR.0000000000000426   MEDICATION ADHERENCES TIPS AND STRATEGIES Taking medication as prescribed improves patient outcomes in heart failure (reduces hospitalizations, improves symptoms, increases survival) Side effects of medications can be managed by decreasing doses, switching agents, stopping drugs, or adding additional therapy. Please let someone in the Heart Failure Clinic know if you have having bothersome  side effects so we can modify your regimen. Do not alter your medication regimen without talking to [place].  Medication reminders can help patients remember to take drugs on time. If you are missing or forgetting doses you can try linking behaviors, using pill boxes, or an electronic reminder like an alarm on your phone or an app. Some people can also get automated phone calls as medication  reminders.

## 2021-07-10 ENCOUNTER — Ambulatory Visit
Admission: RE | Admit: 2021-07-10 | Discharge: 2021-07-10 | Disposition: A | Discharge status: Home / Self Care | Payer: Medicaid Other | Source: Ambulatory Visit | Attending: Family | Admitting: Family

## 2021-07-10 ENCOUNTER — Other Ambulatory Visit: Payer: Self-pay

## 2021-07-10 ENCOUNTER — Other Ambulatory Visit
Admission: RE | Admit: 2021-07-10 | Discharge: 2021-07-10 | Disposition: A | Discharge status: Home / Self Care | Payer: Disability Insurance | Source: Ambulatory Visit | Attending: Family | Admitting: Family

## 2021-07-10 ENCOUNTER — Encounter: Payer: Self-pay | Admitting: Family

## 2021-07-10 ENCOUNTER — Ambulatory Visit (HOSPITAL_BASED_OUTPATIENT_CLINIC_OR_DEPARTMENT_OTHER): Payer: Medicaid Other | Admitting: Family

## 2021-07-10 VITALS — BP 129/76 | HR 64 | Resp 20 | Ht 68.0 in | Wt 244.0 lb

## 2021-07-10 DIAGNOSIS — I5022 Chronic systolic (congestive) heart failure: Secondary | ICD-10-CM | POA: Diagnosis not present

## 2021-07-10 DIAGNOSIS — K219 Gastro-esophageal reflux disease without esophagitis: Secondary | ICD-10-CM | POA: Insufficient documentation

## 2021-07-10 DIAGNOSIS — I252 Old myocardial infarction: Secondary | ICD-10-CM | POA: Diagnosis not present

## 2021-07-10 DIAGNOSIS — Z720 Tobacco use: Secondary | ICD-10-CM

## 2021-07-10 DIAGNOSIS — I2510 Atherosclerotic heart disease of native coronary artery without angina pectoris: Secondary | ICD-10-CM | POA: Diagnosis not present

## 2021-07-10 DIAGNOSIS — I110 Hypertensive heart disease with heart failure: Secondary | ICD-10-CM | POA: Diagnosis not present

## 2021-07-10 DIAGNOSIS — E785 Hyperlipidemia, unspecified: Secondary | ICD-10-CM | POA: Diagnosis not present

## 2021-07-10 DIAGNOSIS — R079 Chest pain, unspecified: Secondary | ICD-10-CM | POA: Diagnosis not present

## 2021-07-10 DIAGNOSIS — Z87891 Personal history of nicotine dependence: Secondary | ICD-10-CM | POA: Insufficient documentation

## 2021-07-10 DIAGNOSIS — I5020 Unspecified systolic (congestive) heart failure: Secondary | ICD-10-CM | POA: Diagnosis not present

## 2021-07-10 DIAGNOSIS — I10 Essential (primary) hypertension: Secondary | ICD-10-CM | POA: Diagnosis not present

## 2021-07-10 DIAGNOSIS — F1021 Alcohol dependence, in remission: Secondary | ICD-10-CM | POA: Diagnosis not present

## 2021-07-10 LAB — ECHOCARDIOGRAM COMPLETE
AR max vel: 2.76 cm2
AV Area VTI: 2.68 cm2
AV Area mean vel: 2.58 cm2
AV Mean grad: 5 mmHg
AV Peak grad: 9.5 mmHg
Ao pk vel: 1.54 m/s
Area-P 1/2: 2.82 cm2
MV VTI: 3.09 cm2
S' Lateral: 4.15 cm

## 2021-07-10 LAB — BASIC METABOLIC PANEL
Anion gap: 7 (ref 5–15)
BUN: 11 mg/dL (ref 6–20)
CO2: 27 mmol/L (ref 22–32)
Calcium: 10.1 mg/dL (ref 8.9–10.3)
Chloride: 106 mmol/L (ref 98–111)
Creatinine, Ser: 0.83 mg/dL (ref 0.61–1.24)
GFR, Estimated: >60 mL/min (ref >60)
Glucose, Bld: 82 mg/dL (ref 70–99)
Potassium: 4 mmol/L (ref 3.5–5.1)
Sodium: 140 mmol/L (ref 135–145)

## 2021-07-10 MED ORDER — PERFLUTREN LIPID MICROSPHERE
1.0000 mL | INTRAVENOUS | Status: AC | PRN
  Administered 2021-07-10: 2 mL via INTRAVENOUS
  Filled 2021-07-10: qty 10

## 2021-07-10 MED ORDER — DAPAGLIFLOZIN PROPANEDIOL 10 MG PO TABS: 10.0000 mg | ORAL_TABLET | Freq: Every day | ORAL | 5 refills | Status: AC

## 2021-07-10 NOTE — Progress Notes (Signed)
Patient ID: Bryce [name], male    DOB: 1966/03/22, 56 y.o.   MRN: [id number]   Bryce [name] is a 56 y/o male with a history of CAD (MI), HTN, GERD, tobacco use and chronic heart failure.   Echo report from 07/10/21: EF of 45%, mild LVH, LA mildly dilated. Echo report from 04/26/2019 reviewed and showed an EF of 45-50%. Echo report from 02/03/17 reviewed and showed an EF of 45-50% along with mild inferior hypokinesis.   LHC completed 03/02/20 and showed: Non-stenotic Mid LAD lesion was previously treated. Mid RCA lesion is 40% stenosed. Previously placed Dist Cx-1 drug eluting stent is widely patent. Balloon angioplasty was performed. Dist Cx-2 lesion is 40% stenosed. The left ventricular ejection fraction is 45-50% by visual estimate. LV end diastolic pressure is normal. There is mild left ventricular systolic dysfunction. There is no mitral valve regurgitation.  Catheterization done 02/02/17 which showed: Mid LAD lesion, 0 %stenosed. Mid RCA lesion, 40 %stenosed. A STENT XIENCE ALPINE RX 3.0X23 drug eluting stent was successfully placed, and does not overlap previously placed stent. Dist Cx lesion, 100 %stenosed. Post intervention, there is a 0% residual stenosis. There is mild left ventricular systolic dysfunction. LV end diastolic pressure is mildly elevated. The left ventricular ejection fraction is 50-55% by visual estimate. Conclusion Successful PCI and stent to mid to distal circumflex with DES stent 3.023 mm xience Reducing the lesion from 100% down to 0 point TIMI-3 from 0 back to 3.  Has not been admitted or been in the ED in the last 6 months.   He presents today for a follow-up visit with a chief complaint of minimal shortness of breath upon moderate exertion. He describes this as chronic in nature having been present for several years. He has associated fatigue, and chest pain.  He denies any difficulty sleeping, abdominal distention, palpitations, pedal edema, cough or  weight gain.  Has not typically had chest pain, although Sunday night he woke up with chest pain, states it felt "crampy" and both hands were numb. He took 324 baby ASA and 1 SL ntg, reports his head hurt so bad that he fell back asleep  Today, he states he feels "sore" across his chest and the pain is noticeable more at rest. Denies radiation, no diaphoresis, no presyncope or syncope, no nausea/GI distress.     Past Medical History:  Diagnosis Date   ASD (atrial septal defect)    a. 04/2019 Echo: small ASD w/ predominantly L->R shunting.   Coronary artery disease    a. 11/2010 NSTEMI/PCI: BMS -->LAD; b. 02/2017 Lateral STEMI/PCI: LM nl, LAD patent stent, LCX 100 (3.0x23 Xience Alpine DES), RCA 21m EF 50%; c. 11/2018 MV: Fixed apical and periapical defect (? over-processing). No significant ischemia. EF 48%.   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 04/2019 Echo: EF 45-50%, mild LVH. Mildly dil LA. Small ASA w/ predominantly L->R shunting.   Hyperlipidemia LDL goal <70    Hypertension    Ischemic cardiomyopathy    a. 04/2019 Echo: EF 45-50%.   LBBB (left bundle branch block)    MI (myocardial infarction) ([facility]    Migraine    Past Surgical History:  Procedure Laterality Date   CARDIAC CATHETERIZATION N/A 12/28/2014   Procedure: Left Heart Cath and Coronary Angiography;  Surgeon: DYolonda Kida MD;  [facility];  Service: Cardiovascular;  Laterality: N/A;   CORONARY ANGIOPLASTY  Non-ST elevation MI status post stenting within bare-metal stent of the 75% lesion of the LAD, 11/13/2010.    CORONARY STENT INTERVENTION N/A 02/02/2017   Procedure: CORONARY/GRAFT ACUTE MI REVASCULARIZATION;  Surgeon: Yolonda Kida, MD;  Location: [place] [facility];  Service: Cardiovascular;  Laterality: N/A;   LEFT HEART CATH AND CORONARY ANGIOGRAPHY N/A 02/02/2017   Procedure: LEFT HEART CATH AND CORONARY  ANGIOGRAPHY;  Surgeon: Yolonda Kida, MD;  Location: [facility];  Service: Cardiovascular;  Laterality: N/A;   LEFT HEART CATH AND CORONARY ANGIOGRAPHY N/A 03/02/2020   Procedure: LEFT HEART CATH AND CORONARY ANGIOGRAPHY;  Surgeon: Minna Merritts, MD;  Location: [facility];  Service: Cardiovascular;  Laterality: N/A;   Family History  Problem Relation Age of Onset   CAD Father    Kidney disease Neg Hx    Diabetes Mellitus II Neg Hx    Social History   Tobacco Use   Smoking status: Some Days    Years: 5.00    Types: Cigarettes    Last attempt to quit: 01/05/2017    Years since quitting: 4.5   Smokeless tobacco: Never   Tobacco comments:    Typically 1 pack a week but recently sometimes 2 packs 10/18/19  Substance Use Topics   Alcohol use: Not Currently    Alcohol/week: 1.0 standard drink    Types: 1 Cans of beer per week    Comment: Social Drinker   Allergies  Allergen Reactions   Atorvastatin     Myalgias   Prior to Admission medications   Medication Sig Start Date End Date Taking? Authorizing Provider  aspirin EC 81 MG EC tablet Take 1 tablet (81 mg total) by mouth daily. 02/05/17  Yes Epifanio Lesches, MD  aspirin-acetaminophen-caffeine (EXCEDRIN MIGRAINE) [phone] MG tablet Take 1 tablet by mouth daily as needed for headache. Patient taking differently: Take 2 tablets by mouth daily as needed for headache. 01/06/19  Yes Iloabachie, Chioma E, NP  Blood Pressure KIT 1 kit by Does not apply route daily. 04/01/19  Yes Iloabachie, Chioma E, NP  clopidogrel (PLAVIX) 75 MG tablet Take 1 tablet (75 mg total) by mouth daily. 07/10/20  Yes Minna Merritts, MD  DULoxetine (CYMBALTA) 30 MG capsule Take 1 capsule (30 mg total) by mouth 2 (two) times daily. 04/11/20  Yes Iloabachie, Chioma E, NP  ezetimibe (ZETIA) 10 MG tablet Take 1 tablet (10 mg total) by mouth daily. 05/24/20  Yes Gollan, Kathlene November, MD  gabapentin (NEURONTIN) 100 MG capsule TAKE ONE CAPSULE  BY MOUTH 3 TIMES A DAY 02/22/20  Yes Iloabachie, Chioma E, NP  lisinopril (ZESTRIL) 20 MG tablet Take 1 tablet (20 mg total) by mouth daily. 07/06/20  Yes Hackney, Tina A, FNP  meloxicam (MOBIC) 15 MG tablet Take 15 mg by mouth daily as needed for pain. Take one tablet daily as needed for pain   Yes [provider]  metFORMIN (GLUCOPHAGE) 1000 MG tablet Take 0.5 tablets (500 mg total) by mouth daily with breakfast. 03/28/20  Yes Iloabachie, Chioma E, NP  metoprolol tartrate (LOPRESSOR) 25 MG tablet TAKE ONE TABLET BY MOUTH 2 TIMES A DAY 07/06/20  Yes Hackney, Tina A, FNP  nitroGLYCERIN (NITROSTAT) 0.4 MG SL tablet Place 1 tablet (0.4 mg total) under the tongue every 5 (five) minutes x 3 doses as needed for chest pain. 04/06/19  Yes Dunn, Ryan M, PA-C  pantoprazole (PROTONIX) 40 MG tablet TAKE ONE TABLET BY MOUTH EVERY DAY 10/28/19  Yes Iloabachie, Chioma E, NP  potassium chloride (MICRO-K) 10 MEQ CR capsule Take 2 capsules (20 mEq total) by mouth 2 (two) times daily. 06/05/21  Yes Darylene Price A, FNP  rosuvastatin (CRESTOR) 40 MG tablet Take 1 tablet (40 mg total) by mouth daily. 07/03/20  Yes Minna Merritts, MD  torsemide (DEMADEX) 20 MG tablet Take 20 mg by mouth 2 (two) times daily as needed.   Yes [provider]   Review of Systems  Constitutional:  Positive for fatigue. Negative for appetite change.  HENT:  Negative for congestion, postnasal drip and sore throat.   Eyes: Negative.   Respiratory:  Positive for shortness of breath (minimal). Negative for cough.   Cardiovascular:  Positive for chest pain (1 episode on Sunday night). Negative for palpitations and leg swelling.  Gastrointestinal:  Negative for abdominal distention and abdominal pain.  Endocrine: Negative.   Genitourinary: Negative.   Musculoskeletal:  Positive for arthralgias (hands aching often). Negative for back pain and neck pain.  Skin: Negative.   Allergic/Immunologic: Negative.   Neurological:  Negative for  dizziness, light-headedness (occasional) and headaches.  Hematological:  Negative for adenopathy. Does not bruise/bleed easily.  Psychiatric/Behavioral:  Negative for dysphoric mood and sleep disturbance (sleeping on 2 pillows). The patient is nervous/anxious.    Vitals:   07/10/21 1112  BP: 129/76  Pulse: 64  Resp: 20  SpO2: 98%  Weight: 244 lb (110.7 kg)  Height: _0  (1.727 m)   Wt Readings from Last 3 Encounters:  07/10/21 244 lb (110.7 kg)  06/05/21 253 lb 2 oz (114.8 kg)  11/27/20 265 lb 4 oz (120.3 kg)   Lab Results  Component Value Date   CREATININE 0.83 07/10/2021   CREATININE 1.06 03/08/2020   CREATININE 1.11 03/02/2020    Physical Exam Vitals and nursing note reviewed.  Constitutional:      General: He is not in acute distress.    Appearance: Normal appearance. He is not diaphoretic.  HENT:     Head: Normocephalic and atraumatic.  Cardiovascular:     Rate and Rhythm: Normal rate and regular rhythm.     Heart sounds: Normal heart sounds.  Pulmonary:     Effort: Pulmonary effort is normal. No respiratory distress.     Breath sounds: No wheezing or rales.  Abdominal:     General: There is no distension.     Palpations: Abdomen is soft.  Musculoskeletal:        General: No tenderness.     Cervical back: Normal range of motion and neck supple.     Right lower leg: No tenderness. No edema.     Left lower leg: No tenderness. No edema.  Skin:    General: Skin is warm and dry.  Neurological:     General: No focal deficit present.     Mental Status: He is alert and oriented to person, place, and time.  Psychiatric:        Mood and Affect: Mood normal.        Behavior: Behavior normal.        Thought Content: Thought content normal.    Assessment & Plan:  1: Chronic heart failure with mildly reduced ejection fraction- - NYHA class II - euvolemic today - weighs daily, wt is down 9 lbs since last visit a month ago; reminded to call for an overnight weight  gain of >2 pounds or a weekly weight gain of >5 pounds - going to [employer] daily and  working out for ~ 1 hour each day - saw cardiology Rockey Situ) 03/14/20 - on GDMT of lisinopril & metoprolol - echo completed today, will add Farxiga 10 mg QD; 30 day voucher provided - BNP 02/29/20 was 20.2 - consider adding MRA next visit  2: Chest pain- - 1 episode Sunday night that awoken him - took 324 mg ASA and 1 SL ntg, fell asleep shortly after - "felt anxious" when he woke up - pain is not reproducible - has some vague complaints of chest "cramping" - attempted to obtain 12 lead, however body habitus would not allow for proper adhering of electrodes, advised we could send to pre-admit testing for 12 lead when he goes for labwork and he refused - he verbalized subsequent plan if CP returns, was advised by cardiology to take ntg x 2 and if pain persists go to ED.    3: HTN- - BP looks good (129/76) - [facility] PCP [facility]  - BMP 03/08/2020 reviewed and showed sodium 141, potassium 4.3, creatinine 1.06 and GFR 79 - will check BMP today  4. Tobacco and Alcohol use - hasn't smoked since September 2021 - denies alcohol use - continued cessation encouraged   Medication bottles reviewed.   Return in 1 month or sooner for any questions/problems before then.

## 2021-07-10 NOTE — Progress Notes (Signed)
*  PRELIMINARY RESULTS* Echocardiogram 2D Echocardiogram has been performed.  Joanette Gula Kam Kushnir 07/10/2021, 10:46 AM

## 2021-07-10 NOTE — Patient Instructions (Signed)
Begin farxiga as 1 tablet every morning.

## 2021-08-08 ENCOUNTER — Ambulatory Visit: Payer: Medicaid Other | Attending: Family | Admitting: Family

## 2021-08-08 ENCOUNTER — Encounter: Payer: Self-pay | Admitting: Family

## 2021-08-08 ENCOUNTER — Other Ambulatory Visit: Payer: Self-pay

## 2021-08-08 VITALS — BP 150/90 | HR 75 | Resp 20 | Ht 68.0 in | Wt 250.6 lb

## 2021-08-08 DIAGNOSIS — I252 Old myocardial infarction: Secondary | ICD-10-CM | POA: Insufficient documentation

## 2021-08-08 DIAGNOSIS — F1021 Alcohol dependence, in remission: Secondary | ICD-10-CM | POA: Diagnosis not present

## 2021-08-08 DIAGNOSIS — I110 Hypertensive heart disease with heart failure: Secondary | ICD-10-CM | POA: Diagnosis not present

## 2021-08-08 DIAGNOSIS — I5022 Chronic systolic (congestive) heart failure: Secondary | ICD-10-CM | POA: Diagnosis not present

## 2021-08-08 DIAGNOSIS — I10 Essential (primary) hypertension: Secondary | ICD-10-CM

## 2021-08-08 DIAGNOSIS — Z87891 Personal history of nicotine dependence: Secondary | ICD-10-CM | POA: Diagnosis not present

## 2021-08-08 DIAGNOSIS — I509 Heart failure, unspecified: Secondary | ICD-10-CM | POA: Insufficient documentation

## 2021-08-08 DIAGNOSIS — K219 Gastro-esophageal reflux disease without esophagitis: Secondary | ICD-10-CM | POA: Insufficient documentation

## 2021-08-08 DIAGNOSIS — I2510 Atherosclerotic heart disease of native coronary artery without angina pectoris: Secondary | ICD-10-CM | POA: Diagnosis not present

## 2021-08-08 DIAGNOSIS — Z720 Tobacco use: Secondary | ICD-10-CM

## 2021-08-08 DIAGNOSIS — R079 Chest pain, unspecified: Secondary | ICD-10-CM | POA: Insufficient documentation

## 2021-08-08 NOTE — Patient Instructions (Signed)
Continue weighing daily and call for an overnight weight gain of 3 pounds or more or a weekly weight gain of more than 5 pounds.   If you have voicemail, please make sure your mailbox is cleaned out so that we may leave a message and please make sure to listen to any voicemails.     

## 2021-08-08 NOTE — Progress Notes (Signed)
Patient ID: Bryce [name], male    DOB: 08-Mar-1966, 56 y.o.   MRN: [id number]   Mr Bryce [name] is a 56 y/o male with a history of CAD (MI), HTN, GERD, tobacco use and chronic heart failure.   Echo report from 07/10/21: EF of 45%, mild LVH, LA mildly dilated. Echo report from 04/26/2019 reviewed and showed an EF of 45-50%. Echo report from 02/03/17 reviewed and showed an EF of 45-50% along with mild inferior hypokinesis.   LHC completed 03/02/20 and showed: Non-stenotic Mid LAD lesion was previously treated. Mid RCA lesion is 40% stenosed. Previously placed Dist Cx-1 drug eluting stent is widely patent. Balloon angioplasty was performed. Dist Cx-2 lesion is 40% stenosed. The left ventricular ejection fraction is 45-50% by visual estimate. LV end diastolic pressure is normal. There is mild left ventricular systolic dysfunction. There is no mitral valve regurgitation.  Catheterization done 02/02/17 which showed: Mid LAD lesion, 0 %stenosed. Mid RCA lesion, 40 %stenosed. A STENT XIENCE ALPINE RX 3.0X23 drug eluting stent was successfully placed, and does not overlap previously placed stent. Dist Cx lesion, 100 %stenosed. Post intervention, there is a 0% residual stenosis. There is mild left ventricular systolic dysfunction. LV end diastolic pressure is mildly elevated. The left ventricular ejection fraction is 50-55% by visual estimate. Conclusion Successful PCI and stent to mid to distal circumflex with DES stent 3.023 mm xience Reducing the lesion from 100% down to 0 point TIMI-3 from 0 back to 3.  Has not been admitted or been in the ED in the last 6 months.   He presents today for a follow-up visit with a chief complaint of moderate shortness of breath with minimal exertion. Describes this as chronic in nature having been present for several years although has worsened some over the last few days. He has associated fatigue, intermittent chest pain, light-headedness and anxiety along with  this. She denies any difficulty sleeping, abdominal distention, palpitations, pedal edema, cough or weight gain.   Says that he went to start the [place] but then read all the potential side effects such as yeast infection and didn't want to "take the chance". Hasn't had to take any torsemide since the end of January. Is no longer drinking soda or eating chips/cookies and trying to take better care of himself. Hasn't smoked any marijuana in the last 2 weeks.   Admits to feeling quite anxious about his health and sometimes has trouble sleeping because he'll start thinking about going to sleep and maybe not waking up.    Past Medical History:  Diagnosis Date   ASD (atrial septal defect)    a. 04/2019 Echo: small ASD w/ predominantly L->R shunting.   Coronary artery disease    a. 11/2010 NSTEMI/PCI: BMS -->LAD; b. 02/2017 Lateral STEMI/PCI: LM nl, LAD patent stent, LCX 100 (3.0x23 Xience Alpine DES), RCA 72m EF 50%; c. 11/2018 MV: Fixed apical and periapical defect (? over-processing). No significant ischemia. EF 48%.   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 04/2019 Echo: EF 45-50%, mild LVH. Mildly dil LA. Small ASA w/ predominantly L->R shunting.   Hyperlipidemia LDL goal <70    Hypertension    Ischemic cardiomyopathy    a. 04/2019 Echo: EF 45-50%.   LBBB (left bundle branch block)    [place] infarction) ([facility]    Migraine    Past Surgical History:  Procedure Laterality Date   CARDIAC CATHETERIZATION N/A 12/28/2014  Procedure: Left Heart Cath and Coronary Angiography;  Surgeon: Yolonda Kida, MD;  Location: [place] [facility];  Service: Cardiovascular;  Laterality: N/A;   CORONARY ANGIOPLASTY     Non-ST elevation MI status post stenting within bare-metal stent of the 75% lesion of the LAD, 11/13/2010.    CORONARY STENT INTERVENTION N/A 02/02/2017   Procedure: CORONARY/GRAFT ACUTE MI REVASCULARIZATION;  Surgeon:  Yolonda Kida, MD;  Location: [place] [facility];  Service: Cardiovascular;  Laterality: N/A;   LEFT HEART CATH AND CORONARY ANGIOGRAPHY N/A 02/02/2017   Procedure: LEFT HEART CATH AND CORONARY ANGIOGRAPHY;  Surgeon: Yolonda Kida, MD;  Location: [place] [facility];  Service: Cardiovascular;  Laterality: N/A;   LEFT HEART CATH AND CORONARY ANGIOGRAPHY N/A 03/02/2020   Procedure: LEFT HEART CATH AND CORONARY ANGIOGRAPHY;  Surgeon: Minna Merritts, MD;  Location: [place] [facility];  Service: Cardiovascular;  Laterality: N/A;   Family History  Problem Relation Age of Onset   CAD Father    Kidney disease Neg Hx    Diabetes Mellitus II Neg Hx    Social History   Tobacco Use   Smoking status: Former    Years: 5.00    Types: Cigarettes    Quit date: 01/05/2017    Years since quitting: 4.5   Smokeless tobacco: Never   Tobacco comments:    Typically 1 pack a week but recently sometimes 2 packs 10/18/19  Substance Use Topics   Alcohol use: Not Currently    Alcohol/week: 1.0 standard drink    Types: 1 Cans of beer per week    Comment: Social Drinker   Allergies  Allergen Reactions   Atorvastatin     Myalgias   Prior to Admission medications   Medication Sig Start Date End Date Taking? Authorizing Provider  aspirin EC 81 MG EC tablet Take 1 tablet (81 mg total) by mouth daily. 02/05/17  Yes Epifanio Lesches, MD  aspirin-acetaminophen-caffeine (EXCEDRIN MIGRAINE) [phone] MG tablet Take 1 tablet by mouth daily as needed for headache. Patient taking differently: Take 2 tablets by mouth daily as needed for headache. 01/06/19  Yes Iloabachie, Chioma E, NP  Blood Pressure KIT 1 kit by Does not apply route daily. 04/01/19  Yes Iloabachie, Chioma E, NP  clopidogrel (PLAVIX) 75 MG tablet Take 1 tablet (75 mg total) by mouth daily. 07/10/20  Yes Minna Merritts, MD  DULoxetine (CYMBALTA) 30 MG capsule Take 1 capsule (30 mg total) by mouth 2 (two) times daily. 04/11/20  Yes  Iloabachie, Chioma E, NP  ezetimibe (ZETIA) 10 MG tablet Take 1 tablet (10 mg total) by mouth daily. 05/24/20  Yes Gollan, Kathlene November, MD  gabapentin (NEURONTIN) 100 MG capsule TAKE ONE CAPSULE BY MOUTH 3 TIMES A DAY 02/22/20  Yes Iloabachie, Chioma E, NP  lisinopril (ZESTRIL) 20 MG tablet Take 1 tablet (20 mg total) by mouth daily. 07/06/20  Yes Kassady Laboy A, FNP  meloxicam (MOBIC) 15 MG tablet Take 15 mg by mouth daily as needed for pain. Take one tablet daily as needed for pain   Yes [provider]  metFORMIN (GLUCOPHAGE) 1000 MG tablet Take 0.5 tablets (500 mg total) by mouth daily with breakfast. 03/28/20  Yes Iloabachie, Chioma E, NP  metoprolol tartrate (LOPRESSOR) 25 MG tablet TAKE ONE TABLET BY MOUTH 2 TIMES A DAY 07/06/20  Yes Lyndol Vanderheiden A, FNP  nitroGLYCERIN (NITROSTAT) 0.4 MG SL tablet Place 1 tablet (0.4 mg total) under the tongue every 5 (five) minutes  x 3 doses as needed for chest pain. 04/06/19  Yes Dunn, Ryan M, PA-C  pantoprazole (PROTONIX) 40 MG tablet TAKE ONE TABLET BY MOUTH EVERY DAY 10/28/19  Yes Iloabachie, Chioma E, NP  potassium chloride (MICRO-K) 10 MEQ CR capsule Take 2 capsules (20 mEq total) by mouth 2 (two) times daily. 06/05/21  Yes Darylene Price A, FNP  rosuvastatin (CRESTOR) 40 MG tablet Take 1 tablet (40 mg total) by mouth daily. 07/03/20  Yes Minna Merritts, MD  torsemide (DEMADEX) 20 MG tablet Take 20 mg by mouth 2 (two) times daily as needed.   Yes [provider]   Review of Systems  Constitutional:  Positive for fatigue. Negative for appetite change.  HENT:  Negative for congestion, postnasal drip and sore throat.   Eyes: Negative.   Respiratory:  Positive for shortness of breath (worsening). Negative for cough.   Cardiovascular:  Positive for chest pain (occasional). Negative for palpitations and leg swelling.  Gastrointestinal:  Negative for abdominal distention and abdominal pain.  Endocrine: Negative.   Genitourinary: Negative.    Musculoskeletal:  Positive for arthralgias (hands aching often). Negative for back pain and neck pain.  Skin: Negative.   Allergic/Immunologic: Negative.   Neurological:  Positive for light-headedness (occasional). Negative for dizziness and headaches.  Hematological:  Negative for adenopathy. Does not bruise/bleed easily.  Psychiatric/Behavioral:  Negative for dysphoric mood and sleep disturbance (sleeping on 2 pillows). The patient is nervous/anxious.    Vitals:   08/08/21 1206  BP: (!) 150/90  Pulse: 75  Resp: 20  SpO2: 97%  Weight: 250 lb 9 oz (113.7 kg)  Height: '5\' 8"'  (1.727 m)   Wt Readings from Last 3 Encounters:  08/08/21 250 lb 9 oz (113.7 kg)  07/10/21 244 lb (110.7 kg)  06/05/21 253 lb 2 oz (114.8 kg)   Lab Results  Component Value Date   CREATININE 0.83 07/10/2021   CREATININE 1.06 03/08/2020   CREATININE 1.11 03/02/2020    Physical Exam Vitals and nursing note reviewed.  Constitutional:      General: He is not in acute distress.    Appearance: Normal appearance. He is not diaphoretic.  HENT:     Head: Normocephalic and atraumatic.  Cardiovascular:     Rate and Rhythm: Normal rate and regular rhythm.     Heart sounds: Normal heart sounds.  Pulmonary:     Effort: Pulmonary effort is normal. No respiratory distress.     Breath sounds: No wheezing or rales.  Abdominal:     General: There is no distension.     Palpations: Abdomen is soft.  Musculoskeletal:        General: No tenderness.     Cervical back: Normal range of motion and neck supple.     Right lower leg: No tenderness. No edema.     Left lower leg: No tenderness. No edema.  Skin:    General: Skin is warm and dry.  Neurological:     General: No focal deficit present.     Mental Status: He is alert and oriented to person, place, and time.  Psychiatric:        Mood and Affect: Mood normal.        Behavior: Behavior normal.        Thought Content: Thought content normal.    Assessment &  Plan:  1: Chronic heart failure with mildly reduced ejection fraction- - NYHA class III - euvolemic today - weighs daily; reminded to call for an overnight  weight gain of >2 pounds or a weekly weight gain of >5 pounds - weight up 6 pounds from last visit here 1 month ago - going to [employer] daily and working out for ~ 1 hour each day - saw cardiology Rockey Situ) 03/14/20; encouraged to call him to get f/u appointment scheduled - on GDMT of lisinopril & metoprolol - attempted farxiga but patient wasn't comfortable starting this after reading potential side effects - BNP 02/29/20 was 20.2  2: Chest pain- - intermittent episodes of chest pain, vague in nature - to f/u with Dr. Rockey Situ if his anginal pattern changes    3: HTN- - BP mildly elevated (150/90) but he's not interested in different medications at this time - sees PCP at [place] 07/10/21 reviewed and showed sodium 140, potassium 4.0, creatinine 0.83 and GFR >60  4. Tobacco and Alcohol use - hasn't smoked since September 2021 - denies alcohol use - continued cessation encouraged   Patient did not bring his medications nor a list. Each medication was verbally reviewed with the patient and he was encouraged to bring the bottles to every visit to confirm accuracy of list.   Return in 6 months, sooner if needed.

## 2022-01-16 ENCOUNTER — Telehealth: Payer: Self-pay | Admitting: Cardiovascular Disease

## 2022-01-16 NOTE — Telephone Encounter (Signed)
Called and spoke with patient. Advised patient that as Dr. Mariah Milling has not seen patient for almost 2 years, he could not advise him to stop or continue a medication. Advised patient to reach out to [facility], NP as it looks as though he has been seeing her more regularly. Pt verbalized understanding and voiced appreciation for the call.

## 2022-01-16 NOTE — Telephone Encounter (Signed)
New Message:      Patient is at [facility]. He had to go to thee ER there on Monday(01-14-22) for Chest pains. He said they told him to stop his blood pressure, because his heart rate was so low. He wants to talk to the nurse about stopping the medicine. He wants to see what Mariah Milling thinks about stopping his Metoprolol.   Pt c/o medication issue:  1. Name of Medication: Metoprolol  2. How are you currently taking this medication (dosage and times per day)?  He is not taking it today  3. Are you having a reaction (difficulty breathing--STAT)?   4. What is your medication issue? Heart rate was so low    .

## 2022-01-23 ENCOUNTER — Ambulatory Visit: Payer: Disability Insurance | Admitting: Family

## 2022-02-11 ENCOUNTER — Ambulatory Visit: Payer: Disability Insurance | Admitting: Family

## 2022-02-11 NOTE — Progress Notes (Deleted)
Patient ID: Bryce [name], male    DOB: February 05, 1966, 56 y.o.   MRN: [id number]   Mr Pratte is a 56 y/o male with a history of CAD (MI), HTN, GERD, tobacco use and chronic heart failure.   Echo report from 07/10/21: EF of 45%, mild LVH, LA mildly dilated. Echo report from 04/26/2019 reviewed and showed an EF of 45-50%. Echo report from 02/03/17 reviewed and showed an EF of 45-50% along with mild inferior hypokinesis.   LHC completed 03/02/20 and showed: Non-stenotic Mid LAD lesion was previously treated. Mid RCA lesion is 40% stenosed. Previously placed Dist Cx-1 drug eluting stent is widely patent. Balloon angioplasty was performed. Dist Cx-2 lesion is 40% stenosed. The left ventricular ejection fraction is 45-50% by visual estimate. LV end diastolic pressure is normal. There is mild left ventricular systolic dysfunction. There is no mitral valve regurgitation.  Catheterization done 02/02/17 which showed: Mid LAD lesion, 0 %stenosed. Mid RCA lesion, 40 %stenosed. A STENT XIENCE ALPINE RX 3.0X23 drug eluting stent was successfully placed, and does not overlap previously placed stent. Dist Cx lesion, 100 %stenosed. Post intervention, there is a 0% residual stenosis. There is mild left ventricular systolic dysfunction. LV end diastolic pressure is mildly elevated. The left ventricular ejection fraction is 50-55% by visual estimate. Conclusion Successful PCI and stent to mid to distal circumflex with DES stent 3.023 mm xience Reducing the lesion from 100% down to 0 point TIMI-3 from 0 back to 3.  Was in the ED 01/14/22 due to chest pain. Evaluated and released.   He presents today for a follow-up visit with a chief complaint of    Past Medical History:  Diagnosis Date   ASD (atrial septal defect)    a. 04/2019 Echo: small ASD w/ predominantly L->R shunting.   Coronary artery disease    a. 11/2010 NSTEMI/PCI: BMS -->LAD; b. 02/2017 Lateral STEMI/PCI: LM nl, LAD patent stent, LCX 100  (3.0x23 Xience Alpine DES), RCA 60m. EF 50%; c. 11/2018 MV: Fixed apical and periapical defect (? over-processing). No significant ischemia. EF 48%.   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 04/2019 Echo: EF 45-50%, mild LVH. Mildly dil LA. Small ASA w/ predominantly L->R shunting.   Hyperlipidemia LDL goal <70    Hypertension    Ischemic cardiomyopathy    a. 04/2019 Echo: EF 45-50%.   LBBB (left bundle branch block)    MI (myocardial infarction) (HCC)    Migraine    Past Surgical History:  Procedure Laterality Date   CARDIAC CATHETERIZATION N/A 12/28/2014   Procedure: Left Heart Cath and Coronary Angiography;  Surgeon: Alwyn Pea, MD;  Location: [facility] CV [facility];  Service: Cardiovascular;  Laterality: N/A;   CORONARY ANGIOPLASTY     Non-ST elevation MI status post stenting within bare-metal stent of the 75% lesion of the LAD, 11/13/2010.    CORONARY STENT INTERVENTION N/A 02/02/2017   Procedure: CORONARY/GRAFT ACUTE MI REVASCULARIZATION;  Surgeon: Alwyn Pea, MD;  Location: [facility];  Service: Cardiovascular;  Laterality: N/A;   LEFT HEART CATH AND CORONARY ANGIOGRAPHY N/A 02/02/2017   Procedure: LEFT HEART CATH AND CORONARY ANGIOGRAPHY;  Surgeon: Alwyn Pea, MD;  Location: [facility];  Service: Cardiovascular;  Laterality: N/A;   LEFT HEART CATH AND CORONARY ANGIOGRAPHY N/A 03/02/2020   Procedure: LEFT HEART CATH AND CORONARY ANGIOGRAPHY;  Surgeon: Antonieta Iba, MD;  Location: [facility]  CV LAB;  Service: Cardiovascular;  Laterality: N/A;   Family History  Problem Relation Age of Onset   CAD Father    Kidney disease Neg Hx    Diabetes Mellitus II Neg Hx    Social History   Tobacco Use   Smoking status: Former    Years: 5.00    Types: Cigarettes    Quit date: 01/05/2017    Years since quitting: 5.1   Smokeless tobacco: Never   Tobacco comments:    Typically 1  pack a week but recently sometimes 2 packs 10/18/19  Substance Use Topics   Alcohol use: Not Currently    Alcohol/week: 1.0 standard drink of alcohol    Types: 1 Cans of beer per week    Comment: Social Drinker   Allergies  Allergen Reactions   Atorvastatin     Myalgias    Review of Systems  Constitutional:  Positive for fatigue. Negative for appetite change.  HENT:  Negative for congestion, postnasal drip and sore throat.   Eyes: Negative.   Respiratory:  Positive for shortness of breath (worsening). Negative for cough.   Cardiovascular:  Positive for chest pain (occasional). Negative for palpitations and leg swelling.  Gastrointestinal:  Negative for abdominal distention and abdominal pain.  Endocrine: Negative.   Genitourinary: Negative.   Musculoskeletal:  Positive for arthralgias (hands aching often). Negative for back pain and neck pain.  Skin: Negative.   Allergic/Immunologic: Negative.   Neurological:  Positive for light-headedness (occasional). Negative for dizziness and headaches.  Hematological:  Negative for adenopathy. Does not bruise/bleed easily.  Psychiatric/Behavioral:  Negative for dysphoric mood and sleep disturbance (sleeping on 2 pillows). The patient is nervous/anxious.        Physical Exam Vitals and nursing note reviewed.  Constitutional:      General: He is not in acute distress.    Appearance: Normal appearance. He is not diaphoretic.  HENT:     Head: Normocephalic and atraumatic.  Cardiovascular:     Rate and Rhythm: Normal rate and regular rhythm.     Heart sounds: Normal heart sounds.  Pulmonary:     Effort: Pulmonary effort is normal. No respiratory distress.     Breath sounds: No wheezing or rales.  Abdominal:     General: There is no distension.     Palpations: Abdomen is soft.  Musculoskeletal:        General: No tenderness.     Cervical back: Normal range of motion and neck supple.     Right lower leg: No tenderness. No edema.      Left lower leg: No tenderness. No edema.  Skin:    General: Skin is warm and dry.  Neurological:     General: No focal deficit present.     Mental Status: He is alert and oriented to person, place, and time.  Psychiatric:        Mood and Affect: Mood normal.        Behavior: Behavior normal.        Thought Content: Thought content normal.     Assessment & Plan:  1: Chronic heart failure with mildly reduced ejection fraction- - NYHA class III - euvolemic today - weighs daily; reminded to call for an overnight weight gain of >2 pounds or a weekly weight gain of >5 pounds - weight 250.9 pounds from last visit here 6 months ago - going to [employer] daily and working out for ~ 1 hour each day - saw cardiology Oncologist)  03/14/20; encouraged to call him to get f/u appointment scheduled - on GDMT of lisinopril & metoprolol - attempted farxiga but patient wasn't comfortable starting this after reading potential side effects - BNP 02/29/20 was 20.2  2: Chest pain- - intermittent episodes of chest pain, vague in nature - to f/u with Dr. Mariah Milling if his anginal pattern changes    3: HTN- - BP  - sees PCP at [facility]  - BMP 01/14/22 reviewed and showed sodium 141, potassium 4.3, creatinine 0.86 and GFR 101  4. Tobacco and Alcohol use - hasn't smoked since September 2021 - denies alcohol use - continued cessation encouraged   Patient did not bring his medications nor a list. Each medication was verbally reviewed with the patient and he was encouraged to bring the bottles to every visit to confirm accuracy of list.

## 2022-02-12 ENCOUNTER — Ambulatory Visit: Payer: Disability Insurance | Admitting: Family

## 2022-02-12 ENCOUNTER — Telehealth: Payer: Self-pay | Admitting: Family

## 2022-02-12 NOTE — Telephone Encounter (Signed)
Patient did not show for his [facility] Failure [facility] appointment on 02/12/22. Will attempt to reschedule.

## 2022-03-17 NOTE — Progress Notes (Deleted)
`       Evaluation Performed:  Follow-up visit  Date:  03/17/2022   ID:  Bryce [name], [place] Oct 09, 1965, MRN [id number]  Patient Location:  [address] [name] [address]   Provider location:   [place], [employer] office  PCP:  [facility]  Cardiologist:  Mariah Milling, [facility]  No chief complaint on file.   History of Present Illness:    Bryce [name] is a 56 y.o. male  past medical history of 1. Status post non-STEMI, BMS LAD 11/13/2010 2. Status post lateral wall STEMI 02/02/2017 3. Status post DES mid to distal left circumflex 02/02/2017 4. Essential hypertension 5. Hyperlipidemia 6. Tobacco abuse Presenting for f/u of his CAD, prior PCI,cardiac catheterization showing nonobstructive disease, patent stents  Last seen by myself 10/21 In the Er in [place] 01/14/22 Had stress test in [place].    Frequent trips to ER for leg swelling and SOB in 03/2019 Stress test 11/2018: low risk  Echo 04/2019   1. Left ventricular ejection fraction, by visual estimation, is 45 to 50%.  Recent unstable angina symptoms, left bundle branch block, Was seen in clinic, scheduled for catheterization March 02, 2020 That showed nonobstructive coronary disease Patent stent in the left circumflex and LAD, no intervention needed Started on Imdur 30  Active, trying to walk more Quit smoking On Imdur 30 reports having a mild headache  Denies any leg swelling, no significant shortness of breath Tolerating torsemide 40 daily  EKG personally reviewed by myself on todays visit NSR rate 63 bpm T wave abnormality V1 through V6, 2 3 aVF Prior left bundle branch block has resolved  Prior records reviewed [facility] 02/02/2017 with lateral wall ST elevation myocardial infarction.  Coronary angiography revealed 100% stenosis of distal left circumflex, and normal left ventricular function.  -- PCI to the mid to distal left circumflex with a 3.0 x 23 mm  Xience Alpine DES.   2D echocardiogram on 02/03/17 revealed borderline mildly reduced left ventricular function with LVEF 45-50%.   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    Prior CV studies:   The following studies were reviewed today:    Past Medical History:  Diagnosis Date   ASD (atrial septal defect)    a. 04/2019 Echo: small ASD w/ predominantly L->R shunting.   Coronary artery disease    a. 11/2010 NSTEMI/PCI: BMS -->LAD; b. 02/2017 Lateral STEMI/PCI: LM nl, LAD patent stent, LCX 100 (3.0x23 Xience Alpine DES), RCA 66m. EF 50%; c. 11/2018 MV: Fixed apical and periapical defect (? over-processing). No significant ischemia. EF 48%.   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 04/2019 Echo: EF 45-50%, mild LVH. Mildly dil LA. Small ASA w/ predominantly L->R shunting.   Hyperlipidemia LDL goal <70    Hypertension    Ischemic cardiomyopathy    a. 04/2019 Echo: EF 45-50%.   LBBB (left bundle branch block)    MI (myocardial infarction) (HCC)    Migraine    Past Surgical History:  Procedure Laterality Date   CARDIAC CATHETERIZATION N/A 12/28/2014   Procedure: Left Heart Cath and Coronary Angiography;  Surgeon: Alwyn Pea, MD;  Location: [facility];  Service: Cardiovascular;  Laterality: N/A;   CORONARY ANGIOPLASTY     Non-ST elevation MI status post stenting within bare-metal stent of the  75% lesion of the LAD, 11/13/2010.    CORONARY STENT INTERVENTION N/A 02/02/2017   Procedure: CORONARY/GRAFT ACUTE MI REVASCULARIZATION;  Surgeon: Alwyn Pea, MD;  Location: [facility];  Service: Cardiovascular;  Laterality: N/A;   LEFT HEART CATH AND CORONARY ANGIOGRAPHY N/A 02/02/2017   Procedure: LEFT HEART CATH AND CORONARY ANGIOGRAPHY;  Surgeon: Alwyn Pea, MD;  Location: [facility];   Service: Cardiovascular;  Laterality: N/A;   LEFT HEART CATH AND CORONARY ANGIOGRAPHY N/A 03/02/2020   Procedure: LEFT HEART CATH AND CORONARY ANGIOGRAPHY;  Surgeon: Antonieta Iba, MD;  Location: [facility];  Service: Cardiovascular;  Laterality: N/A;     No outpatient medications have been marked as taking for the 03/18/22 encounter (Appointment) with Antonieta Iba, MD.     Allergies:   Atorvastatin   Social History   Tobacco Use   Smoking status: Former    Years: 5.00    Types: Cigarettes    Quit date: 01/05/2017    Years since quitting: 5.1   Smokeless tobacco: Never   Tobacco comments:    Typically 1 pack a week but recently sometimes 2 packs 10/18/19  Vaping Use   Vaping Use: Never used  Substance Use Topics   Alcohol use: Not Currently    Alcohol/week: 1.0 standard drink of alcohol    Types: 1 Cans of beer per week    Comment: Social Drinker   Drug use: Yes    Types: Marijuana    Comment: everyday      Family Hx: The patient's family history includes CAD in his father. There is no history of Kidney disease or Diabetes Mellitus II.  ROS:   Please see the history of present illness.    Review of Systems  Constitutional: Negative.   HENT: Negative.    Respiratory:  Positive for shortness of breath.   Cardiovascular:  Positive for leg swelling.  Gastrointestinal: Negative.   Musculoskeletal: Negative.   Neurological: Negative.   Psychiatric/Behavioral: Negative.    All other systems reviewed and are negative.    Labs/Other Tests and Data Reviewed:    Recent Labs: 07/10/2021: BUN 11; Creatinine, Ser 0.83; Potassium 4.0; Sodium 140   Recent Lipid Panel Lab Results  Component Value Date/Time   CHOL 112 03/08/2020 11:03 AM   TRIG 113 03/08/2020 11:03 AM   HDL 40 03/08/2020 11:03 AM   CHOLHDL 2.8 03/08/2020 11:03 AM   CHOLHDL 2.5 03/01/2020 06:15 AM   LDLCALC 51 03/08/2020 11:03 AM   LDLDIRECT 107 (H) 04/06/2019 03:12 PM    Wt Readings from  Last 3 Encounters:  08/08/21 250 lb 9 oz (113.7 kg)  07/10/21 244 lb (110.7 kg)  06/05/21 253 lb 2 oz (114.8 kg)     Exam:    Vital Signs: Vital signs may also be detailed in the HPI There were no vitals taken for this visit.  Constitutional:  oriented to person, place, and time. No distress.  HENT:  Head: Grossly normal Eyes:  no discharge. No scleral icterus.  Neck: No JVD, no carotid bruits  Cardiovascular: Regular rate and rhythm, no murmurs appreciated Pulmonary/Chest: Clear to auscultation bilaterally, no wheezes or rails Abdominal: Soft.  no distension.  no tenderness.  Musculoskeletal: Normal range of motion Neurological:  normal muscle tone. Coordination normal. 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 (  [place]) Long history of coronary disease with stenting going back to 2012 Recent cardiac catheterization, patent stents, nonobstructive disease Has intermittent left bundle branch block, asymptomatic in terms of his chest pain on isosorbide 30 but is having headache.  Recommend he try half pill daily Other option would be Ranexa  Essential hypertension Blood pressure is well controlled on today's visit. No changes made to the medications.  Mixed hyperlipidemia Continue Zetia with Crestor 40 daily Numbers at goal.  Recommend he stay on both  Smoker Reports he has quit smoking, feels well  Chronic diastolic/systolic CHF No significant edema, tolerating torsemide Breathing stable   Total encounter time more than 25 minutes  Greater than 50% was spent in counseling and coordination of care with the patient    Signed, Ida Rogue, MD  03/17/2022 2:54 PM    [facility] [id number], [place], [place] [address]

## 2022-03-18 ENCOUNTER — Encounter: Payer: Self-pay | Admitting: Cardiovascular Disease

## 2022-03-18 ENCOUNTER — Ambulatory Visit: Payer: Medicaid Other | Attending: Cardiovascular Disease | Admitting: Cardiovascular Disease

## 2022-03-18 DIAGNOSIS — I5020 Unspecified systolic (congestive) heart failure: Secondary | ICD-10-CM

## 2022-03-18 DIAGNOSIS — I25118 Atherosclerotic heart disease of native coronary artery with other forms of angina pectoris: Secondary | ICD-10-CM

## 2022-03-18 DIAGNOSIS — I10 Essential (primary) hypertension: Secondary | ICD-10-CM

## 2022-03-18 DIAGNOSIS — Z720 Tobacco use: Secondary | ICD-10-CM

## 2022-03-18 DIAGNOSIS — E782 Mixed hyperlipidemia: Secondary | ICD-10-CM

## 2022-03-18 DIAGNOSIS — I5022 Chronic systolic (congestive) heart failure: Secondary | ICD-10-CM

## 2022-03-18 DIAGNOSIS — R079 Chest pain, unspecified: Secondary | ICD-10-CM
